# Patient Record
Sex: Female | Born: 1935 | Race: White | Hispanic: No | Marital: Married | Smoking: Never smoker
Health system: Southern US, Community
[De-identification: ages and names within clinical notes are randomized; demographics above are authoritative.]

## PROBLEM LIST (undated history)

## (undated) DIAGNOSIS — Z9221 Personal history of antineoplastic chemotherapy: Secondary | ICD-10-CM

## (undated) DIAGNOSIS — C50919 Malignant neoplasm of unspecified site of unspecified female breast: Secondary | ICD-10-CM

## (undated) DIAGNOSIS — I10 Essential (primary) hypertension: Secondary | ICD-10-CM

## (undated) DIAGNOSIS — F0390 Unspecified dementia without behavioral disturbance: Secondary | ICD-10-CM

## (undated) DIAGNOSIS — Z923 Personal history of irradiation: Secondary | ICD-10-CM

## (undated) HISTORY — PX: TONSILLECTOMY: SUR1361

---

## 2004-11-22 ENCOUNTER — Ambulatory Visit: Payer: Self-pay | Admitting: Internal Medicine

## 2005-03-11 ENCOUNTER — Ambulatory Visit: Payer: Self-pay | Admitting: Internal Medicine

## 2005-03-13 ENCOUNTER — Inpatient Hospital Stay: Payer: Self-pay | Admitting: Rheumatology

## 2005-12-26 ENCOUNTER — Ambulatory Visit: Payer: Self-pay | Admitting: Internal Medicine

## 2006-12-06 ENCOUNTER — Emergency Department: Payer: Self-pay | Admitting: Unknown Physician Specialty

## 2007-02-09 ENCOUNTER — Ambulatory Visit: Payer: Self-pay | Admitting: Nurse Practitioner

## 2007-06-21 ENCOUNTER — Ambulatory Visit: Payer: Self-pay | Admitting: Internal Medicine

## 2007-10-08 DIAGNOSIS — C50919 Malignant neoplasm of unspecified site of unspecified female breast: Secondary | ICD-10-CM

## 2007-10-08 HISTORY — DX: Malignant neoplasm of unspecified site of unspecified female breast: C50.919

## 2008-02-11 ENCOUNTER — Ambulatory Visit: Payer: Self-pay | Admitting: Family Medicine

## 2008-02-15 ENCOUNTER — Ambulatory Visit: Payer: Self-pay | Admitting: Internal Medicine

## 2008-03-05 ENCOUNTER — Ambulatory Visit: Payer: Self-pay | Admitting: Family Medicine

## 2008-03-15 ENCOUNTER — Ambulatory Visit: Payer: Self-pay | Admitting: Surgery

## 2008-03-15 HISTORY — PX: BREAST BIOPSY: SHX20

## 2008-03-31 ENCOUNTER — Other Ambulatory Visit: Payer: Self-pay

## 2008-03-31 ENCOUNTER — Ambulatory Visit: Payer: Self-pay | Admitting: Surgery

## 2008-04-06 ENCOUNTER — Ambulatory Visit: Payer: Self-pay | Admitting: Internal Medicine

## 2008-04-06 DIAGNOSIS — C50911 Malignant neoplasm of unspecified site of right female breast: Secondary | ICD-10-CM | POA: Insufficient documentation

## 2008-04-07 ENCOUNTER — Ambulatory Visit: Payer: Self-pay | Admitting: Surgery

## 2008-04-14 HISTORY — PX: BREAST LUMPECTOMY: SHX2

## 2008-04-27 ENCOUNTER — Ambulatory Visit: Payer: Self-pay | Admitting: Internal Medicine

## 2008-05-07 ENCOUNTER — Ambulatory Visit: Payer: Self-pay | Admitting: Internal Medicine

## 2008-05-21 ENCOUNTER — Ambulatory Visit: Payer: Self-pay | Admitting: Internal Medicine

## 2008-05-30 ENCOUNTER — Emergency Department: Payer: Self-pay | Admitting: Emergency Medicine

## 2008-06-07 ENCOUNTER — Ambulatory Visit: Payer: Self-pay | Admitting: Internal Medicine

## 2008-07-07 ENCOUNTER — Ambulatory Visit: Payer: Self-pay | Admitting: Internal Medicine

## 2008-08-07 ENCOUNTER — Ambulatory Visit: Payer: Self-pay | Admitting: Internal Medicine

## 2008-09-06 ENCOUNTER — Ambulatory Visit: Payer: Self-pay | Admitting: Internal Medicine

## 2008-10-07 ENCOUNTER — Ambulatory Visit: Payer: Self-pay | Admitting: Internal Medicine

## 2008-10-26 ENCOUNTER — Ambulatory Visit: Payer: Self-pay | Admitting: Internal Medicine

## 2008-11-07 ENCOUNTER — Ambulatory Visit: Payer: Self-pay | Admitting: Internal Medicine

## 2008-11-29 ENCOUNTER — Ambulatory Visit: Payer: Self-pay | Admitting: Internal Medicine

## 2008-12-05 ENCOUNTER — Ambulatory Visit: Payer: Self-pay | Admitting: Internal Medicine

## 2008-12-22 ENCOUNTER — Ambulatory Visit: Payer: Self-pay | Admitting: Internal Medicine

## 2009-01-05 ENCOUNTER — Ambulatory Visit: Payer: Self-pay | Admitting: Internal Medicine

## 2009-02-04 ENCOUNTER — Ambulatory Visit: Payer: Self-pay | Admitting: Radiation Oncology

## 2009-02-23 ENCOUNTER — Ambulatory Visit: Payer: Self-pay | Admitting: Internal Medicine

## 2009-02-27 ENCOUNTER — Ambulatory Visit: Payer: Self-pay | Admitting: Surgery

## 2009-03-07 ENCOUNTER — Ambulatory Visit: Payer: Self-pay | Admitting: Radiation Oncology

## 2009-04-06 ENCOUNTER — Ambulatory Visit: Payer: Self-pay | Admitting: Internal Medicine

## 2009-04-13 ENCOUNTER — Ambulatory Visit: Payer: Self-pay | Admitting: Internal Medicine

## 2009-05-07 ENCOUNTER — Ambulatory Visit: Payer: Self-pay | Admitting: Internal Medicine

## 2009-05-29 ENCOUNTER — Ambulatory Visit: Payer: Self-pay | Admitting: Surgery

## 2009-07-07 ENCOUNTER — Ambulatory Visit: Payer: Self-pay | Admitting: Internal Medicine

## 2009-08-03 ENCOUNTER — Ambulatory Visit: Payer: Self-pay | Admitting: Internal Medicine

## 2009-08-07 ENCOUNTER — Ambulatory Visit: Payer: Self-pay | Admitting: Internal Medicine

## 2009-09-06 ENCOUNTER — Ambulatory Visit: Payer: Self-pay | Admitting: Internal Medicine

## 2009-11-07 ENCOUNTER — Ambulatory Visit: Payer: Self-pay | Admitting: Internal Medicine

## 2009-11-23 ENCOUNTER — Ambulatory Visit: Payer: Self-pay | Admitting: Internal Medicine

## 2009-12-05 ENCOUNTER — Ambulatory Visit: Payer: Self-pay | Admitting: Internal Medicine

## 2010-03-01 ENCOUNTER — Ambulatory Visit: Payer: Self-pay | Admitting: Internal Medicine

## 2010-05-07 ENCOUNTER — Ambulatory Visit: Payer: Self-pay | Admitting: Internal Medicine

## 2010-05-24 ENCOUNTER — Ambulatory Visit: Payer: Self-pay | Admitting: Internal Medicine

## 2010-06-07 ENCOUNTER — Ambulatory Visit: Payer: Self-pay | Admitting: Internal Medicine

## 2010-08-21 ENCOUNTER — Ambulatory Visit: Payer: Self-pay | Admitting: Internal Medicine

## 2010-09-03 ENCOUNTER — Ambulatory Visit: Payer: Self-pay | Admitting: Internal Medicine

## 2010-09-06 ENCOUNTER — Ambulatory Visit: Payer: Self-pay | Admitting: Internal Medicine

## 2010-11-29 ENCOUNTER — Ambulatory Visit: Payer: Self-pay | Admitting: Internal Medicine

## 2010-11-30 LAB — CANCER ANTIGEN 27.29: CA 27.29: 26.7 U/mL (ref 0.0–38.6)

## 2010-12-06 ENCOUNTER — Ambulatory Visit: Payer: Self-pay | Admitting: Internal Medicine

## 2011-03-21 ENCOUNTER — Ambulatory Visit: Payer: Self-pay | Admitting: Surgery

## 2011-06-20 ENCOUNTER — Ambulatory Visit: Payer: Self-pay | Admitting: Internal Medicine

## 2011-06-21 LAB — CANCER ANTIGEN 27.29: CA 27.29: 16.4 U/mL (ref 0.0–38.6)

## 2011-07-08 ENCOUNTER — Ambulatory Visit: Payer: Self-pay | Admitting: Internal Medicine

## 2012-01-16 ENCOUNTER — Ambulatory Visit: Payer: Self-pay | Admitting: Internal Medicine

## 2012-01-16 LAB — CREATININE, SERUM
Creatinine: 0.67 mg/dL (ref 0.60–1.30)
EGFR (Non-African Amer.): >60

## 2012-01-16 LAB — CBC CANCER CENTER
Basophil #: 0 x10 3/mm (ref 0.0–0.1)
Eosinophil %: 0.6 %
HGB: 13.3 g/dL (ref 12.0–16.0)
Lymphocyte #: 1.2 x10 3/mm (ref 1.0–3.6)
MCV: 97 fL (ref 80–100)
Neutrophil #: 5.5 x10 3/mm (ref 1.4–6.5)
RBC: 4.06 10*6/uL (ref 3.80–5.20)
RDW: 12.7 % (ref 11.5–14.5)
WBC: 7.4 x10 3/mm (ref 3.6–11.0)

## 2012-01-16 LAB — HEPATIC FUNCTION PANEL A (ARMC)
Alkaline Phosphatase: 96 U/L (ref 50–136)
Bilirubin,Total: 0.4 mg/dL (ref 0.2–1.0)
SGPT (ALT): 26 U/L

## 2012-01-17 LAB — CANCER ANTIGEN 27.29: CA 27.29: 23.4 U/mL (ref 0.0–38.6)

## 2012-02-05 ENCOUNTER — Ambulatory Visit: Payer: Self-pay | Admitting: Internal Medicine

## 2012-03-11 ENCOUNTER — Emergency Department: Payer: Self-pay | Admitting: Emergency Medicine

## 2012-03-11 ENCOUNTER — Ambulatory Visit: Payer: Self-pay | Admitting: Family Medicine

## 2012-03-11 LAB — COMPREHENSIVE METABOLIC PANEL
Albumin: 4 g/dL (ref 3.4–5.0)
BUN: 9 mg/dL (ref 7–18)
Calcium, Total: 9.1 mg/dL (ref 8.5–10.1)
Co2: 31 mmol/L (ref 21–32)
Creatinine: 0.62 mg/dL (ref 0.60–1.30)
Glucose: 90 mg/dL (ref 65–99)
Osmolality: 265 (ref 275–301)
Potassium: 3.5 mmol/L (ref 3.5–5.1)
SGOT(AST): 27 U/L (ref 15–37)
Sodium: 133 mmol/L — ABNORMAL LOW (ref 136–145)

## 2012-03-11 LAB — URINALYSIS, COMPLETE
Bacteria: NONE SEEN
Bilirubin,UR: NEGATIVE
Blood: NEGATIVE
Glucose,UR: NEGATIVE mg/dL (ref 0–75)
Ketone: NEGATIVE
Nitrite: NEGATIVE
Ph: 7 (ref 4.5–8.0)
Protein: NEGATIVE
RBC,UR: 1 /HPF (ref 0–5)
Squamous Epithelial: <1
WBC UR: 4 /HPF (ref 0–5)

## 2012-03-11 LAB — CBC
HGB: 13 g/dL (ref 12.0–16.0)
MCHC: 33.4 g/dL (ref 32.0–36.0)
MCV: 96 fL (ref 80–100)
RBC: 4.03 10*6/uL (ref 3.80–5.20)
RDW: 13 % (ref 11.5–14.5)
WBC: 7.3 10*3/uL (ref 3.6–11.0)

## 2012-03-17 ENCOUNTER — Ambulatory Visit: Payer: Self-pay | Admitting: Family Medicine

## 2012-03-23 ENCOUNTER — Ambulatory Visit: Payer: Self-pay | Admitting: Internal Medicine

## 2012-03-27 ENCOUNTER — Ambulatory Visit: Payer: Self-pay | Admitting: Gastroenterology

## 2012-04-06 ENCOUNTER — Ambulatory Visit: Payer: Self-pay | Admitting: Internal Medicine

## 2012-04-24 ENCOUNTER — Ambulatory Visit: Payer: Self-pay | Admitting: Gastroenterology

## 2012-04-27 LAB — PATHOLOGY REPORT

## 2012-05-07 ENCOUNTER — Ambulatory Visit: Payer: Self-pay | Admitting: Internal Medicine

## 2012-07-09 ENCOUNTER — Ambulatory Visit: Payer: Self-pay | Admitting: Cardiothoracic Surgery

## 2012-07-09 ENCOUNTER — Ambulatory Visit: Payer: Self-pay | Admitting: Internal Medicine

## 2012-07-23 LAB — CBC CANCER CENTER
Basophil #: 0.1 x10 3/mm (ref 0.0–0.1)
Basophil %: 1.6 %
Eosinophil #: 0 x10 3/mm (ref 0.0–0.7)
HCT: 40.7 % (ref 35.0–47.0)
HGB: 13.6 g/dL (ref 12.0–16.0)
Lymphocyte %: 11.9 %
MCH: 32.6 pg (ref 26.0–34.0)
MCHC: 33.5 g/dL (ref 32.0–36.0)
MCV: 97 fL (ref 80–100)
Monocyte #: 0.5 x10 3/mm (ref 0.2–0.9)
Neutrophil #: 5.5 x10 3/mm (ref 1.4–6.5)
Neutrophil %: 78.8 %
RDW: 13.5 % (ref 11.5–14.5)

## 2012-07-23 LAB — HEPATIC FUNCTION PANEL A (ARMC)
Albumin: 3.8 g/dL (ref 3.4–5.0)
Bilirubin, Direct: 0.1 mg/dL (ref 0.00–0.20)
Bilirubin,Total: 0.4 mg/dL (ref 0.2–1.0)
SGOT(AST): 20 U/L (ref 15–37)
Total Protein: 7.4 g/dL (ref 6.4–8.2)

## 2012-07-23 LAB — CREATININE, SERUM: EGFR (African American): >60

## 2012-07-24 LAB — CANCER ANTIGEN 27.29: CA 27.29: 20.7 U/mL

## 2012-08-07 ENCOUNTER — Ambulatory Visit: Payer: Self-pay | Admitting: Internal Medicine

## 2012-11-04 ENCOUNTER — Ambulatory Visit: Payer: Self-pay | Admitting: Cardiothoracic Surgery

## 2012-11-11 ENCOUNTER — Ambulatory Visit: Payer: Self-pay | Admitting: Internal Medicine

## 2012-11-14 ENCOUNTER — Inpatient Hospital Stay: Payer: Self-pay | Admitting: Internal Medicine

## 2012-11-14 LAB — LIPASE, BLOOD: Lipase: 136 U/L (ref 73–393)

## 2012-11-14 LAB — URINALYSIS, COMPLETE
Bilirubin,UR: NEGATIVE
Blood: NEGATIVE
Leukocyte Esterase: NEGATIVE
Nitrite: NEGATIVE
Ph: 8 (ref 4.5–8.0)
Protein: NEGATIVE
RBC,UR: 2 /HPF (ref 0–5)
Specific Gravity: 1.01 (ref 1.003–1.030)
Squamous Epithelial: NONE SEEN
WBC UR: 3 /HPF (ref 0–5)

## 2012-11-14 LAB — CBC
HCT: 41.5 % (ref 35.0–47.0)
MCH: 32.4 pg (ref 26.0–34.0)
MCHC: 34.1 g/dL (ref 32.0–36.0)
MCV: 95 fL (ref 80–100)
RBC: 4.36 10*6/uL (ref 3.80–5.20)
RDW: 13 % (ref 11.5–14.5)
WBC: 11.8 10*3/uL — ABNORMAL HIGH (ref 3.6–11.0)

## 2012-11-14 LAB — COMPREHENSIVE METABOLIC PANEL
Anion Gap: 7 (ref 7–16)
Bilirubin,Total: 0.5 mg/dL (ref 0.2–1.0)
Chloride: 91 mmol/L — ABNORMAL LOW (ref 98–107)
EGFR (African American): >60
EGFR (Non-African Amer.): >60
Glucose: 102 mg/dL — ABNORMAL HIGH (ref 65–99)
Osmolality: 253 (ref 275–301)
Potassium: 4.4 mmol/L (ref 3.5–5.1)
SGOT(AST): 24 U/L (ref 15–37)
SGPT (ALT): 20 U/L (ref 12–78)
Total Protein: 7.9 g/dL (ref 6.4–8.2)

## 2012-11-15 LAB — COMPREHENSIVE METABOLIC PANEL
Albumin: 3.3 g/dL — ABNORMAL LOW (ref 3.4–5.0)
Anion Gap: 7 (ref 7–16)
BUN: 6 mg/dL — ABNORMAL LOW (ref 7–18)
Bilirubin,Total: 0.6 mg/dL (ref 0.2–1.0)
EGFR (African American): >60
EGFR (Non-African Amer.): >60
Glucose: 95 mg/dL (ref 65–99)
Potassium: 3.6 mmol/L (ref 3.5–5.1)
SGOT(AST): 19 U/L (ref 15–37)
SGPT (ALT): 15 U/L (ref 12–78)
Sodium: 133 mmol/L — ABNORMAL LOW (ref 136–145)
Total Protein: 6.2 g/dL — ABNORMAL LOW (ref 6.4–8.2)

## 2012-11-15 LAB — CBC WITH DIFFERENTIAL/PLATELET
Basophil #: 0 10*3/uL (ref 0.0–0.1)
Basophil %: 0.3 %
Eosinophil #: 0 10*3/uL (ref 0.0–0.7)
Eosinophil %: 0.6 %
HCT: 36.5 % (ref 35.0–47.0)
Lymphocyte #: 0.9 10*3/uL — ABNORMAL LOW (ref 1.0–3.6)
MCH: 32 pg (ref 26.0–34.0)
Monocyte %: 7.9 %
Neutrophil #: 6.6 10*3/uL — ABNORMAL HIGH (ref 1.4–6.5)
Platelet: 225 10*3/uL (ref 150–440)
RBC: 3.8 10*6/uL (ref 3.80–5.20)
RDW: 12.5 % (ref 11.5–14.5)
WBC: 8.2 10*3/uL (ref 3.6–11.0)

## 2012-12-05 ENCOUNTER — Ambulatory Visit: Payer: Self-pay | Admitting: Internal Medicine

## 2013-01-21 ENCOUNTER — Ambulatory Visit: Payer: Self-pay | Admitting: Internal Medicine

## 2013-01-21 LAB — CBC CANCER CENTER
Basophil %: 0.5 %
Eosinophil #: 0 x10 3/mm (ref 0.0–0.7)
Eosinophil %: 0.5 %
HCT: 39.6 % (ref 35.0–47.0)
HGB: 13.2 g/dL (ref 12.0–16.0)
Lymphocyte #: 1.2 x10 3/mm (ref 1.0–3.6)
Lymphocyte %: 18.7 %
MCV: 96 fL (ref 80–100)
Monocyte %: 8.7 %
Neutrophil %: 71.6 %
Platelet: 256 x10 3/mm (ref 150–440)
RBC: 4.13 10*6/uL (ref 3.80–5.20)
WBC: 6.5 x10 3/mm (ref 3.6–11.0)

## 2013-01-21 LAB — HEPATIC FUNCTION PANEL A (ARMC)
Alkaline Phosphatase: 82 U/L (ref 50–136)
Bilirubin, Direct: 0.1 mg/dL (ref 0.00–0.20)
Bilirubin,Total: 0.4 mg/dL (ref 0.2–1.0)
SGOT(AST): 18 U/L (ref 15–37)

## 2013-01-21 LAB — CREATININE, SERUM
Creatinine: 0.73 mg/dL (ref 0.60–1.30)
EGFR (African American): >60

## 2013-01-22 LAB — CANCER ANTIGEN 27.29: CA 27.29: 23 U/mL (ref 0.0–38.6)

## 2013-02-04 ENCOUNTER — Ambulatory Visit: Payer: Self-pay | Admitting: Internal Medicine

## 2013-03-07 ENCOUNTER — Ambulatory Visit: Payer: Self-pay | Admitting: Internal Medicine

## 2013-03-11 ENCOUNTER — Ambulatory Visit: Payer: Self-pay | Admitting: Cardiothoracic Surgery

## 2013-03-30 ENCOUNTER — Ambulatory Visit: Payer: Self-pay | Admitting: Internal Medicine

## 2013-04-06 ENCOUNTER — Ambulatory Visit: Payer: Self-pay | Admitting: Internal Medicine

## 2013-04-30 ENCOUNTER — Ambulatory Visit: Payer: Self-pay | Admitting: Gastroenterology

## 2013-05-06 ENCOUNTER — Emergency Department: Payer: Self-pay | Admitting: Emergency Medicine

## 2013-05-06 LAB — COMPREHENSIVE METABOLIC PANEL
Albumin: 3.9 g/dL (ref 3.4–5.0)
Alkaline Phosphatase: 91 U/L (ref 50–136)
Calcium, Total: 9.2 mg/dL (ref 8.5–10.1)
EGFR (African American): >60
EGFR (Non-African Amer.): >60
Glucose: 114 mg/dL — ABNORMAL HIGH (ref 65–99)
Potassium: 3.8 mmol/L (ref 3.5–5.1)
SGPT (ALT): 24 U/L (ref 12–78)
Sodium: 130 mmol/L — ABNORMAL LOW (ref 136–145)
Total Protein: 7.3 g/dL (ref 6.4–8.2)

## 2013-05-06 LAB — URINALYSIS, COMPLETE
Bilirubin,UR: NEGATIVE
Protein: NEGATIVE

## 2013-05-06 LAB — LIPASE, BLOOD: Lipase: 198 U/L (ref 73–393)

## 2013-05-06 LAB — CBC
HCT: 40.1 % (ref 35.0–47.0)
HGB: 13.8 g/dL (ref 12.0–16.0)
MCH: 33 pg (ref 26.0–34.0)
RBC: 4.19 10*6/uL (ref 3.80–5.20)
RDW: 12.8 % (ref 11.5–14.5)

## 2013-11-25 ENCOUNTER — Ambulatory Visit: Payer: Self-pay | Admitting: Internal Medicine

## 2013-11-25 LAB — HEPATIC FUNCTION PANEL A (ARMC)
Albumin: 4.3 g/dL (ref 3.4–5.0)
Alkaline Phosphatase: 101 U/L
BILIRUBIN DIRECT: 0.1 mg/dL (ref 0.00–0.20)
Bilirubin,Total: 0.4 mg/dL (ref 0.2–1.0)
SGOT(AST): 19 U/L (ref 15–37)
SGPT (ALT): 25 U/L (ref 12–78)
Total Protein: 7.8 g/dL (ref 6.4–8.2)

## 2013-11-25 LAB — CREATININE, SERUM: CREATININE: 0.77 mg/dL (ref 0.60–1.30)

## 2013-11-25 LAB — CBC CANCER CENTER
BASOS ABS: 0.1 x10 3/mm (ref 0.0–0.1)
Basophil %: 0.7 %
Eosinophil #: 0 x10 3/mm (ref 0.0–0.7)
Eosinophil %: 0.6 %
HCT: 42.4 % (ref 35.0–47.0)
HGB: 14.4 g/dL (ref 12.0–16.0)
LYMPHS PCT: 19.3 %
Lymphocyte #: 1.5 x10 3/mm (ref 1.0–3.6)
MCH: 32.7 pg (ref 26.0–34.0)
MCHC: 33.9 g/dL (ref 32.0–36.0)
MCV: 97 fL (ref 80–100)
MONO ABS: 0.5 x10 3/mm (ref 0.2–0.9)
Monocyte %: 6.8 %
NEUTROS PCT: 72.6 %
Neutrophil #: 5.6 x10 3/mm (ref 1.4–6.5)
Platelet: 258 x10 3/mm (ref 150–440)
RBC: 4.39 10*6/uL (ref 3.80–5.20)
RDW: 12.8 % (ref 11.5–14.5)
WBC: 7.7 x10 3/mm (ref 3.6–11.0)

## 2013-11-26 LAB — CANCER ANTIGEN 27.29: CA 27.29: 22.5 U/mL (ref 0.0–38.6)

## 2013-12-05 ENCOUNTER — Ambulatory Visit: Payer: Self-pay | Admitting: Internal Medicine

## 2014-01-27 ENCOUNTER — Ambulatory Visit: Payer: Self-pay | Admitting: Internal Medicine

## 2014-03-16 ENCOUNTER — Ambulatory Visit: Payer: Self-pay | Admitting: Internal Medicine

## 2014-03-24 ENCOUNTER — Ambulatory Visit: Payer: Self-pay | Admitting: Internal Medicine

## 2014-03-31 ENCOUNTER — Ambulatory Visit: Payer: Self-pay | Admitting: Internal Medicine

## 2014-03-31 ENCOUNTER — Ambulatory Visit: Payer: Self-pay | Admitting: Gastroenterology

## 2014-12-01 ENCOUNTER — Ambulatory Visit: Payer: Self-pay | Admitting: Internal Medicine

## 2014-12-06 ENCOUNTER — Ambulatory Visit
Admit: 2014-12-06 | Disposition: A | Discharge status: Home / Self Care | Payer: Self-pay | Attending: Internal Medicine | Admitting: Internal Medicine

## 2015-01-06 ENCOUNTER — Ambulatory Visit
Admit: 2015-01-06 | Disposition: A | Discharge status: Home / Self Care | Payer: Self-pay | Attending: Internal Medicine | Admitting: Internal Medicine

## 2015-01-27 NOTE — Discharge Summary (Signed)
PATIENT NAME:  Cynthia Walker, Cynthia Walker MR#:  [id number] DATE OF BIRTH:  1934-12-04  DATE OF ADMISSION:  11/14/2012 DATE OF DISCHARGE:  11/16/2012  PRIMARY CARE PHYSICIAN:  Sofie Hartigan, MD  DISCHARGE DIAGNOSES: Acute diverticulitis, hyponatremia, hypertension, breast cancer.   CONDITION: Stable.   CODE STATUS: Full code.   HOME MEDICATIONS:  Fosamax 1 tablet p.o. daily, Femara 2.5 mg p.o. tablets once a day, hydrochlorothiazide 25 mg p.o. daily, vitamin C 1 tablet p.o. daily,  vitamin B 1 tablet p.o. daily, multivitamin 1 tablet p.o. daily, Cipro 500 mg p.o. q. 12 hours for 7 days, Flagyl 500 mg p.o. every 8 hours for 7 days.   DIET: Low-sodium diet.   ACTIVITY: As tolerated.   FOLLOWUP CARE: Follow up with PCP within 1 to 2 weeks.   REASON FOR ADMISSION: Left lower quadrant abdominal pain.   HOSPITAL COURSE: The patient is a 80 year old Caucasian female with a history of recurrent diverticulitis, who presented to the ED due to acute onset of left lower quadrant pain, which is sharp. In ED, CAT scan of the abdomen showed acute diverticulitis. The patient was seen by on-call surgeon, but they did not feel there was any surgical issue that needed to be addressed. For detailed history and physical examination, please refer to the admission note dictated by Dr. Dustin Flock. On admission date, the patient's WBC 11.8, hemoglobin 14.2, BUN 7, creatinine 0.61. The patient was admitted for acute diverticulitis and has been treated with Cipro, Flagyl and pain control. The patient was placed on clear liquids. The patient denies any nausea and vomiting. No diarrhea. Abdominal pain has improved. She only feels a little bit sore. The patient's white count is normal, only has mild hyponatremia with sodium of 133. She was treated with IV fluid. The patient is clinically stable and will be discharged to home today. I discussed the patient's discharge plan with the patient and the patient's husband.   TIME  SPENT: About 36 minutes.     ____________________________ Demetrios Loll, MD qc:cc D: 11/16/2012 16:11:01 ET T: 11/16/2012 22:16:34 ET JOB#: [id number]  cc: Demetrios Loll, MD, <Dictator> Demetrios Loll MD ELECTRONICALLY SIGNED 11/17/2012 15:09

## 2015-01-27 NOTE — H&P (Signed)
PATIENT NAME:  Cynthia Walker, Cynthia Walker MR#:  [id number] DATE OF BIRTH:  [birth year]  DATE OF ADMISSION:  11/14/2012  PRIMARY CARE PROVIDER:  Dr.    EMERGENCY DEPARTMENT REFERRING PHYSICIAN:  Dr. Ulice Brilliant.   CHIEF COMPLAINT:  Left lower quadrant abdominal pain.   HISTORY OF PRESENT ILLNESS:  The patient is a pleasant 80 year old white female with history of recurrent diverticulitis who states that earlier this morning she developed acute onset of left lower quadrant abdominal pain.  Sharp in nature.  Came to the ED, had a CT scan which showed acute diverticulitis.  She was seen in consultation by on-call surgery.  They did not feel that there was any surgical issues that needed to be addressed.  She was noted to be a little hyponatremic, therefore we are asked to admit the patient for diverticulitis and hyponatremia.  The patient otherwise reports that she has not eaten anything, but has not had any nausea, vomiting or diarrhea.  Denies any fevers or chills.  No chest pains.  No shortness of breath.   PAST MEDICAL HISTORY:  History of having diverticulitis x 5 time previously.  Hypertension, history of right breast cancer status post lumpectomy, was not able to tolerate chemo, so had radiation.   PAST SURGICAL HISTORY: 1.  Status post right lumpectomy.  2.  Status post tonsillectomy.   ALLERGIES:  QUESTIONABLE TO PENICILLIN, ALSO ALLERGIC TO SOY.   SOCIAL HISTORY:  Does not smoke.  Does not drink.  No drugs.   FAMILY HISTORY:  Positive for coronary artery disease.   CURRENT MEDICATIONS:  HCTZ 25 by mouth daily.  She is on alendronate weekly.  She is also on letrozole 2.5 mg by mouth daily.   REVIEW OF SYSTEMS:  CONSTITUTIONAL:  Denies any fevers, fatigueness, weakness.  Complains of left lower quadrant pain.  No weight loss.  No weight gain.  EYES:  No blurred or double vision.  No pain.  No redness.  No inflammation.  No glaucoma.  EARS, NOSE, THROAT:  No tinnitus.  No ear pain.  No difficulty  swallowing.  No postnasal drip.  RESPIRATORY:  Denies any cough, wheezing.  No COPD.  No TB.  CARDIOVASCULAR:  No chest pain.  No orthopnea.  No edema.  No syncope.  GASTROINTESTINAL:  No nausea, vomiting.  Complains of left lower quadrant abdominal pain.  No hematemesis.  No melena.  No GERD.  GENITOURINARY:  Denies any dysuria, hematuria, renal calculus or frequency.  ENDOCRINE:  Denies any polyuria, nocturia or thyroid problems.  HEMATOLOGY AND LYMPHATIC:  Denies anemia, easy bruisability or bleeding.  SKIN:  No acne.  No rash.  No changes in mole, hair or skin.  MUSCULOSKELETAL:  Denies any pain in neck, back or shoulder.  NEUROLOGIC:  No numbness.  No CVA.  No TIA.  No seizures.  PSYCHIATRIC:  No anxiety.  No insomnia.  No ADD.  No OCD.   PHYSICAL EXAMINATION: VITAL SIGNS:  Temperature 95.9, pulse 99, respirations 20, blood pressure 175/88, O2 98%.  GENERAL:  The patient is a well-developed, well-nourished female in no acute distress.  HEENT:  Head atraumatic, normocephalic.  Pupils equally round, reactive to light and accommodation.  There is no conjunctival pallor.  No scleral icterus.  Nasal exam shows no drainage or ulceration.  Oropharynx clear without any exudate.  NECK:  No thyromegaly.  No carotid bruits.  CARDIOVASCULAR:  Regular rate and rhythm.  No murmurs, rubs, clicks or gallops.  PMI is not displaced.  LUNGS:  Clear to auscultation bilaterally without any rales, rhonchi, wheezing.  ABDOMEN:  There is a left lower quadrant tenderness without any guarding or rebound.  EXTREMITIES:  No clubbing, cyanosis, edema.  SKIN:  No rash.  LYMPHATICS:  No lymph nodes palpable.  VASCULAR:  Good DP, PT pulses.  PSYCHIATRIC:  Not anxious or depressed.   LABORATORY EVALUATIONS:  CT scan of the abdomen shows findings consistent with acute diverticulitis of the sigmoid colon.  No extraluminal fluid collection identified.  WBC count was 11.8, hemoglobin 14.2, platelet count 13.  BMP, glucose  102, BUN 7, creatinine 0.61, sodium 127, potassium 4.4, chloride 91, CO2 is 29.  LFTs were normal.   ASSESSMENT AND PLAN:  The patient is a 80 year old white female with history of recurrent diverticulitis, presents with left lower quadrant abdominal pain, has acute diverticulitis.  1.  Acute diverticulitis.  At this time we will treat her with Cipro and Flagyl and pain control.  We will put her on a clear liquid diet.  The patient has been seen by Dr. Leanora Cover who feels no surgical need, he states that if patient wants to have elective surgery for recurrent diverticulitis, she has been seen by Dr. Tamala Julian who recommends a follow-up.  If does not improve in the hospital he recommends Dr. Thompson Caul evaluation.  2.  Hyponatremia, likely due to dehydration as well as HCTZ therapy.  At this time we will go ahead and hold her HCTZ, give her normal saline.  Follow-up BMP in the morning.  3.  Hypertension.  We will hold HCTZ for now.  4.  Breast cancer.  We will continue letrozole daily.  5.  Miscellaneous.  We will place her on Lovenox for deep vein thrombosis prophylaxis.   TIME SPENT:  Thirty-five minutes spent.      ____________________________ Lafonda Mosses. Posey Pronto, MD shp:ea D: 11/14/2012 22:29:48 ET T: 11/15/2012 04:11:30 ET JOB#: [id number]  cc: Ferdinando Lodge H. Posey Pronto, MD, <Dictator> Alric Seton MD ELECTRONICALLY SIGNED 11/17/2012 10:19

## 2015-02-03 ENCOUNTER — Other Ambulatory Visit: Payer: Self-pay

## 2015-02-03 DIAGNOSIS — Z1231 Encounter for screening mammogram for malignant neoplasm of breast: Secondary | ICD-10-CM

## 2015-02-21 ENCOUNTER — Other Ambulatory Visit: Payer: Self-pay | Admitting: *Deleted

## 2015-02-21 DIAGNOSIS — C50911 Malignant neoplasm of unspecified site of right female breast: Secondary | ICD-10-CM

## 2015-02-23 ENCOUNTER — Other Ambulatory Visit: Payer: Self-pay | Admitting: *Deleted

## 2015-02-23 DIAGNOSIS — C50911 Malignant neoplasm of unspecified site of right female breast: Secondary | ICD-10-CM

## 2015-03-28 ENCOUNTER — Other Ambulatory Visit: Payer: Self-pay | Admitting: Family Medicine

## 2015-03-28 DIAGNOSIS — R1032 Left lower quadrant pain: Secondary | ICD-10-CM

## 2015-04-03 ENCOUNTER — Other Ambulatory Visit: Payer: Self-pay | Admitting: Internal Medicine

## 2015-04-03 ENCOUNTER — Ambulatory Visit: Payer: Self-pay

## 2015-04-03 ENCOUNTER — Ambulatory Visit
Admission: RE | Admit: 2015-04-03 | Discharge: 2015-04-03 | Disposition: A | Discharge status: Home / Self Care | Payer: Medicare Other | Source: Ambulatory Visit | Attending: Internal Medicine | Admitting: Internal Medicine

## 2015-04-03 ENCOUNTER — Ambulatory Visit: Payer: Medicare Other

## 2015-04-03 DIAGNOSIS — C50911 Malignant neoplasm of unspecified site of right female breast: Secondary | ICD-10-CM

## 2015-04-03 DIAGNOSIS — Z853 Personal history of malignant neoplasm of breast: Secondary | ICD-10-CM | POA: Insufficient documentation

## 2015-04-03 HISTORY — DX: Malignant neoplasm of unspecified site of unspecified female breast: C50.919

## 2015-04-04 ENCOUNTER — Ambulatory Visit
Admission: RE | Admit: 2015-04-04 | Discharge: 2015-04-04 | Disposition: A | Discharge status: Home / Self Care | Payer: Medicare Other | Source: Ambulatory Visit | Attending: Family Medicine | Admitting: Family Medicine

## 2015-04-04 ENCOUNTER — Other Ambulatory Visit
Admission: AD | Admit: 2015-04-04 | Discharge: 2015-04-04 | Disposition: A | Discharge status: Home / Self Care | Payer: Medicare Other | Source: Ambulatory Visit | Attending: Family Medicine | Admitting: Family Medicine

## 2015-04-04 ENCOUNTER — Encounter (INDEPENDENT_AMBULATORY_CARE_PROVIDER_SITE_OTHER): Payer: Self-pay

## 2015-04-04 DIAGNOSIS — R1032 Left lower quadrant pain: Secondary | ICD-10-CM | POA: Diagnosis present

## 2015-04-04 DIAGNOSIS — R911 Solitary pulmonary nodule: Secondary | ICD-10-CM | POA: Insufficient documentation

## 2015-04-04 DIAGNOSIS — I7090 Unspecified atherosclerosis: Secondary | ICD-10-CM | POA: Diagnosis not present

## 2015-04-04 HISTORY — DX: Essential (primary) hypertension: I10

## 2015-04-04 LAB — CREATININE, SERUM
Creatinine, Ser: 0.63 mg/dL (ref 0.44–1.00)
GFR calc Af Amer: >60 mL/min (ref >60)
GFR calc non Af Amer: >60 mL/min (ref >60)

## 2015-04-04 LAB — BUN: BUN: 11 mg/dL (ref 6–20)

## 2015-04-04 MED ORDER — IOHEXOL 300 MG/ML  SOLN
100.0000 mL | Freq: Once | INTRAMUSCULAR | Status: AC | PRN
  Administered 2015-04-04: 100 mL via INTRAVENOUS

## 2015-07-10 ENCOUNTER — Ambulatory Visit: Payer: Medicare Other | Admitting: Anesthesiology

## 2015-07-10 ENCOUNTER — Ambulatory Visit
Admission: RE | Admit: 2015-07-10 | Discharge: 2015-07-10 | Disposition: A | Discharge status: Home / Self Care | Payer: Medicare Other | Source: Ambulatory Visit | Attending: Gastroenterology | Admitting: Gastroenterology

## 2015-07-10 ENCOUNTER — Encounter
Admission: RE | Disposition: A | Discharge status: Home / Self Care | Payer: Self-pay | Source: Ambulatory Visit | Attending: Gastroenterology

## 2015-07-10 DIAGNOSIS — R1032 Left lower quadrant pain: Secondary | ICD-10-CM | POA: Insufficient documentation

## 2015-07-10 DIAGNOSIS — Z79899 Other long term (current) drug therapy: Secondary | ICD-10-CM | POA: Diagnosis not present

## 2015-07-10 DIAGNOSIS — M810 Age-related osteoporosis without current pathological fracture: Secondary | ICD-10-CM | POA: Insufficient documentation

## 2015-07-10 DIAGNOSIS — Z86010 Personal history of colonic polyps: Secondary | ICD-10-CM | POA: Insufficient documentation

## 2015-07-10 DIAGNOSIS — K5730 Diverticulosis of large intestine without perforation or abscess without bleeding: Secondary | ICD-10-CM | POA: Diagnosis not present

## 2015-07-10 DIAGNOSIS — I10 Essential (primary) hypertension: Secondary | ICD-10-CM | POA: Diagnosis not present

## 2015-07-10 DIAGNOSIS — K5900 Constipation, unspecified: Secondary | ICD-10-CM | POA: Diagnosis not present

## 2015-07-10 DIAGNOSIS — E785 Hyperlipidemia, unspecified: Secondary | ICD-10-CM | POA: Diagnosis not present

## 2015-07-10 DIAGNOSIS — Z853 Personal history of malignant neoplasm of breast: Secondary | ICD-10-CM | POA: Insufficient documentation

## 2015-07-10 HISTORY — PX: COLONOSCOPY WITH PROPOFOL: SHX5780

## 2015-07-10 SURGERY — COLONOSCOPY WITH PROPOFOL
Anesthesia: General

## 2015-07-10 MED ORDER — LIDOCAINE HCL (CARDIAC) 20 MG/ML IV SOLN
INTRAVENOUS | Status: DC | PRN
  Administered 2015-07-10: 60 mg via INTRAVENOUS

## 2015-07-10 MED ORDER — SODIUM CHLORIDE 0.9 % IV SOLN
INTRAVENOUS | Status: DC
  Administered 2015-07-10 (×2): via INTRAVENOUS

## 2015-07-10 MED ORDER — PROPOFOL 10 MG/ML IV BOLUS
INTRAVENOUS | Status: DC | PRN
  Administered 2015-07-10: 40 mg via INTRAVENOUS

## 2015-07-10 MED ORDER — PROPOFOL 500 MG/50ML IV EMUL
INTRAVENOUS | Status: DC | PRN
  Administered 2015-07-10: 140 ug/kg/min via INTRAVENOUS

## 2015-07-10 MED ORDER — SODIUM CHLORIDE 0.9 % IV SOLN: INTRAVENOUS | Status: DC

## 2015-07-10 MED ORDER — PHENYLEPHRINE HCL 10 MG/ML IJ SOLN
INTRAMUSCULAR | Status: DC | PRN
  Administered 2015-07-10: 100 ug via INTRAVENOUS

## 2015-07-10 NOTE — Anesthesia Preprocedure Evaluation (Signed)
Anesthesia Evaluation  Patient identified by MRN, date of birth, ID band Patient awake    Airway Mallampati: II       Dental no notable dental hx.    Pulmonary    Pulmonary exam normal        Cardiovascular hypertension, Pt. on medications Normal cardiovascular exam     Neuro/Psych    GI/Hepatic negative GI ROS, Neg liver ROS,   Endo/Other  negative endocrine ROS  Renal/GU negative Renal ROS     Musculoskeletal negative musculoskeletal ROS (+)   Abdominal Normal abdominal exam  (+)   Peds  Hematology negative hematology ROS (+)   Anesthesia Other Findings   Reproductive/Obstetrics negative OB ROS                             Anesthesia Physical Anesthesia Plan  ASA: II  Anesthesia Plan: General   Post-op Pain Management:    Induction: Intravenous  Airway Management Planned: Nasal Cannula  Additional Equipment:   Intra-op Plan:   Post-operative Plan:   Informed Consent: I have reviewed the patients History and Physical, chart, labs and discussed the procedure including the risks, benefits and alternatives for the proposed anesthesia with the patient or authorized representative who has indicated his/her understanding and acceptance.     Plan Discussed with: CRNA  Anesthesia Plan Comments:         Anesthesia Quick Evaluation

## 2015-07-10 NOTE — Op Note (Signed)
[facility] Patient Name: Zacari Radick Procedure Date: 07/10/2015 11:47 AM MRN: [id number] Account #: [id number] Date of Birth: [birth year] Admit Type: Outpatient Age: 80 Room: [facility] ENDO ROOM 4 Gender: Female Note Status: Finalized Procedure:         Colonoscopy Indications:       Abdominal pain in the left lower quadrant, Personal                     history of colonic polyps Providers:         Lupita Dawn. Candace Cruise, MD Referring MD:      Sofie Hartigan (Referring MD) Medicines:         Monitored Anesthesia Care Complications:     No immediate complications. Procedure:         Pre-Anesthesia Assessment:                    - Prior to the procedure, a History and Physical was                     performed, and patient medications, allergies and                     sensitivities were reviewed. The patient's tolerance of                     previous anesthesia was reviewed.                    - The risks and benefits of the procedure and the sedation                     options and risks were discussed with the patient. All                     questions were answered and informed consent was obtained.                    - After reviewing the risks and benefits, the patient was                     deemed in satisfactory condition to undergo the procedure.                    After obtaining informed consent, the colonoscope was                     passed under direct vision. Throughout the procedure, the                     patient's blood pressure, pulse, and oxygen saturations                     were monitored continuously. The Colonoscope was                     introduced through the anus and advanced to the the cecum,                     identified by appendiceal orifice and ileocecal valve. The                     colonoscopy was performed without difficulty. The patient  tolerated the procedure well. The quality of the bowel                   preparation was good. Findings:      Multiple small and large-mouthed diverticula were found in the sigmoid       colon.      The exam was otherwise without abnormality. Impression:        - Diverticulosis in the sigmoid colon.                    - The examination was otherwise normal.                    - No specimens collected. Recommendation:    - Discharge patient to home.                    - High fiber diet daily.                    - The findings and recommendations were discussed with the                     patient. Procedure Code(s): --- Professional ---                    [phone], Colonoscopy, flexible; diagnostic, including                     collection of specimen(s) by brushing or washing, when                     performed (separate procedure) Diagnosis Code(s): --- Professional ---                    R10.32, Left lower quadrant pain                    Z86.010, Personal history of colonic polyps                    K57.30, Diverticulosis of large intestine without                     perforation or abscess without bleeding CPT copyright 2014 American Medical Association. All rights reserved. The codes documented in this report are preliminary and upon coder review may  be revised to meet current compliance requirements. Hulen Luster, MD 07/10/2015 12:06:55 PM This report has been signed electronically. Number of Addenda: 0 Note Initiated On: 07/10/2015 11:47 AM Scope Withdrawal Time: 0 hours 5 minutes 16 seconds  Total Procedure Duration: 0 hours 10 minutes 21 seconds       [facility]

## 2015-07-10 NOTE — H&P (Signed)
  Date of Initial H&P:06/27/2015 History reviewed, patient examined, no change in status, stable for surgery.

## 2015-07-10 NOTE — Transfer of Care (Signed)
Immediate Anesthesia Transfer of Care Note  Patient: Cynthia Walker  Procedure(s) Performed: Procedure(s): COLONOSCOPY WITH PROPOFOL (N/A)  Patient Location: Endoscopy Unit  Anesthesia Type:General  Level of Consciousness: awake, alert , oriented and patient cooperative  Airway & Oxygen Therapy: Patient Spontanous Breathing and Patient connected to nasal cannula oxygen  Post-op Assessment: Report given to RN, Post -op Vital signs reviewed and stable and Patient moving all extremities X 4  Post vital signs: Reviewed and stable  Last Vitals:  Filed Vitals:   07/10/15 1210  BP: 106/68  Pulse: 62  Temp: 36 C  Resp: 12    Complications: No apparent anesthesia complications

## 2015-07-11 ENCOUNTER — Encounter: Payer: Self-pay | Admitting: Gastroenterology

## 2015-07-11 NOTE — Anesthesia Postprocedure Evaluation (Signed)
  Anesthesia Post-op Note  Patient: Cynthia Walker  Procedure(s) Performed: Procedure(s): COLONOSCOPY WITH PROPOFOL (N/A)  Anesthesia type:General  Patient location: PACU  Post pain: Pain level controlled  Post assessment: Post-op Vital signs reviewed, Patient's Cardiovascular Status Stable, Respiratory Function Stable, Patent Airway and No signs of Nausea or vomiting  Post vital signs: Reviewed and stable  Last Vitals:  Filed Vitals:   07/10/15 1240  BP: 106/75  Pulse: 64  Temp:   Resp: 20    Level of consciousness: awake, alert  and patient cooperative  Complications: No apparent anesthesia complications

## 2015-11-29 ENCOUNTER — Other Ambulatory Visit: Payer: Self-pay | Admitting: *Deleted

## 2015-11-29 DIAGNOSIS — C50919 Malignant neoplasm of unspecified site of unspecified female breast: Secondary | ICD-10-CM

## 2015-11-30 ENCOUNTER — Inpatient Hospital Stay: Payer: Medicare Other | Attending: Internal Medicine

## 2015-11-30 ENCOUNTER — Encounter: Payer: Self-pay | Admitting: Internal Medicine

## 2015-11-30 ENCOUNTER — Ambulatory Visit (HOSPITAL_BASED_OUTPATIENT_CLINIC_OR_DEPARTMENT_OTHER): Payer: Medicare Other | Admitting: Internal Medicine

## 2015-11-30 VITALS — BP 101/69 | HR 84 | Temp 98.6°F | Resp 18 | Ht 63.0 in | Wt 134.3 lb

## 2015-11-30 DIAGNOSIS — I10 Essential (primary) hypertension: Secondary | ICD-10-CM | POA: Diagnosis not present

## 2015-11-30 DIAGNOSIS — N644 Mastodynia: Secondary | ICD-10-CM

## 2015-11-30 DIAGNOSIS — Z170 Estrogen receptor positive status [ER+]: Secondary | ICD-10-CM | POA: Insufficient documentation

## 2015-11-30 DIAGNOSIS — Z853 Personal history of malignant neoplasm of breast: Secondary | ICD-10-CM | POA: Diagnosis not present

## 2015-11-30 DIAGNOSIS — M810 Age-related osteoporosis without current pathological fracture: Secondary | ICD-10-CM

## 2015-11-30 DIAGNOSIS — Z79899 Other long term (current) drug therapy: Secondary | ICD-10-CM | POA: Diagnosis not present

## 2015-11-30 DIAGNOSIS — K589 Irritable bowel syndrome without diarrhea: Secondary | ICD-10-CM

## 2015-11-30 DIAGNOSIS — Z9221 Personal history of antineoplastic chemotherapy: Secondary | ICD-10-CM | POA: Diagnosis not present

## 2015-11-30 DIAGNOSIS — C50919 Malignant neoplasm of unspecified site of unspecified female breast: Secondary | ICD-10-CM

## 2015-11-30 DIAGNOSIS — Z9223 Personal history of estrogen therapy: Secondary | ICD-10-CM | POA: Diagnosis not present

## 2015-11-30 DIAGNOSIS — K5790 Diverticulosis of intestine, part unspecified, without perforation or abscess without bleeding: Secondary | ICD-10-CM

## 2015-11-30 DIAGNOSIS — Z923 Personal history of irradiation: Secondary | ICD-10-CM | POA: Diagnosis not present

## 2015-11-30 DIAGNOSIS — C50911 Malignant neoplasm of unspecified site of right female breast: Secondary | ICD-10-CM

## 2015-11-30 LAB — CBC WITH DIFFERENTIAL/PLATELET
BASOS ABS: 0.1 10*3/uL (ref 0–0.1)
BASOS PCT: 1 %
EOS ABS: 0 10*3/uL (ref 0–0.7)
EOS PCT: 0 %
HCT: 39.5 % (ref 35.0–47.0)
HEMOGLOBIN: 13.3 g/dL (ref 12.0–16.0)
LYMPHS ABS: 1.2 10*3/uL (ref 1.0–3.6)
Lymphocytes Relative: 12 %
MCH: 32.5 pg (ref 26.0–34.0)
MCHC: 33.5 g/dL (ref 32.0–36.0)
MCV: 96.9 fL (ref 80.0–100.0)
Monocytes Absolute: 0.6 10*3/uL (ref 0.2–0.9)
Monocytes Relative: 6 %
NEUTROS PCT: 81 %
Neutro Abs: 8.6 10*3/uL — ABNORMAL HIGH (ref 1.4–6.5)
PLATELETS: 269 10*3/uL (ref 150–440)
RBC: 4.08 MIL/uL (ref 3.80–5.20)
RDW: 12.6 % (ref 11.5–14.5)
WBC: 10.5 10*3/uL (ref 3.6–11.0)

## 2015-11-30 LAB — CREATININE, SERUM
CREATININE: 0.72 mg/dL (ref 0.44–1.00)
GFR calc Af Amer: >60 mL/min (ref >60)

## 2015-11-30 LAB — HEPATIC FUNCTION PANEL
ALK PHOS: 80 U/L (ref 38–126)
ALT: 16 U/L (ref 14–54)
AST: 24 U/L (ref 15–41)
Albumin: 4.1 g/dL (ref 3.5–5.0)
Bilirubin, Direct: <0.1 mg/dL — ABNORMAL LOW (ref 0.1–0.5)
TOTAL PROTEIN: 6.8 g/dL (ref 6.5–8.1)
Total Bilirubin: 0.3 mg/dL (ref 0.3–1.2)

## 2015-11-30 MED ORDER — ALENDRONATE SODIUM 70 MG PO TABS: 70.0000 mg | ORAL_TABLET | ORAL | Status: DC

## 2015-11-30 NOTE — Progress Notes (Signed)
Pt is sensitive to touch with breast exam. She does not do her own self breast exams.  She has no c/o today and she is eating and drinking without problems. Wt. steady

## 2015-11-30 NOTE — Progress Notes (Signed)
[place] @ [facility] [phone]) [phone]  Fax:(336) [place]: 1935-09-16  MR#: [id number]  JYN#:[id number]  Patient Care Team: Sofie Hartigan, MD as PCP - General (Family Medicine)  CHIEF COMPLAINT: No chief complaint on file.    No history exists.   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 Tumor size 1.1 cm,  2/2 lymph nodes negative. Margins negative for carcinoma, no venous or lymphatic invasion. ER and PR positive.  HER-2/neu negative (2+ by IHC, negative on gene amplification).  adjuvant chemotherapy since Oncotype DX test showed intermediate risk (score of 21). Adjuvant chemotherapy discontinued after significant allergic reaction to Taxotere/Cytoxan, then to weekly Taxol/Cytoxan.  Completed 5 Years of adjuvant hormonal therapy with Femara (October 26, 2008 - Jan 2015).  No flowsheet data found.  HISTORY OF PRESENT ILLNESS:    Cynthia Walker returns to our clinic for a follow-up visit. She has done well since her last visit. She had colonoscopy in October 2016, which revealed diverticulosis, but no other abnormal findings. She has had a persistent tenderness in both breasts for a very long time, without any changes in the condition. She specifically denies bone pains, nausea, vomiting, diarrhea, constipation, bleeding from any source, fevers, chills.  REVIEW OF SYSTEMS:   Review of Systems  All other systems reviewed and are negative.    PAST MEDICAL HISTORY: Past Medical History  Diagnosis Date  . Breast cancer [facility]) 2009    Right breast CA with lumpectomy, radiation and chemo tx's.  Marland Kitchen Hypertension    Irritable Bowel Syndrome  Diverticulosis  Fibrocystic Breast Disease     PAST SURGICAL HISTORY: Past Surgical History  Procedure Laterality Date  . Breast excisional biopsy Right 2009  . Colonoscopy with propofol N/A 07/10/2015    Procedure:  COLONOSCOPY WITH PROPOFOL;  Surgeon: Hulen Luster, MD;  Location: [facility] ENDOSCOPY;  Service: Gastroenterology;  Laterality: N/A;    FAMILY HISTORY No family history on file. Noncontributory. Great grandmother had breast cancer ADVANCED DIRECTIVES:  No flowsheet data found.  HEALTH MAINTENANCE: Social History  Substance Use Topics  . Smoking status: Never Smoker   . Smokeless tobacco: Never Used  . Alcohol Use: No   this he   Allergies  Allergen Reactions  . Penicillins Rash    Current Outpatient Prescriptions  Medication Sig Dispense Refill  . Calcium Carbonate-Vitamin D (CALTRATE 600+D) 600-400 MG-UNIT tablet Take 1 tablet by mouth daily.    Marland Kitchen lisinopril (PRINIVIL,ZESTRIL) 10 MG tablet Take 10 mg by mouth daily.    Marland Kitchen alendronate (FOSAMAX) 70 MG tablet Take 1 tablet (70 mg total) by mouth once a week. Take with a full glass of water on an empty stomach. 4 tablet 6   No current facility-administered medications for this visit.    OBJECTIVE:  Filed Vitals:   11/30/15 1055  BP: 101/69  Pulse: 84  Temp: 98.6 F (37 C)  Resp: 18     Body mass index is 23.79 kg/(m^2). I think is a problem as best way     ECOG FS:0 - Asymptomatic  Physical Exam BP 101/69 mmHg  Pulse 84  Temp(Src) 98.6 F (37 C) (Tympanic)  Resp 18  Ht '5\' 3"'$  (1.6 m)  Wt 134 lb 4.2 oz (60.9 kg)  BMI 23.79 kg/m2  General Appearance:    Alert, cooperative, no distress, appears younger than stated age 81 female  Head:    Normocephalic, without obvious abnormality, atraumatic  Eyes:    PERRL, conjunctiva/corneas clear, EOM's intact, fundi    benign, both eyes  Ears:    Normal TM's and external ear canals, both ears  Nose:   Nares normal, septum midline, mucosa normal, no drainage    or sinus tenderness  Throat:   Lips, mucosa, and tongue normal; teeth and gums normal  Neck:   Supple, symmetrical, trachea midline, no adenopathy;    thyroid:  no enlargement/tenderness/nodules; no carotid   bruit  or JVD  Back:     Symmetric, no curvature, ROM normal, no CVA tenderness  Lungs:     Clear to auscultation bilaterally, respirations unlabored  Chest Wall:    No tenderness or deformity   Heart:    Regular rate and rhythm, S1 and S2 normal, no murmur, rub   or gallop  Breast Exam:    right-status post lumpectomy. Diffusely tender breast without clearly palpable masses. No lymphadenopathy or nipple discharge Left-mildly diffusely tender breast without clearly palpable masses, discharge or lymphadenopathy   Abdomen:     Soft, non-tender, bowel sounds active all four quadrants,    no masses, no organomegaly  Extremities:   Extremities normal, atraumatic, no cyanosis or edema  Pulses:   2+ and symmetric all extremities  Skin:   Skin color, texture, turgor normal, no rashes or lesions  Lymph nodes:   Cervical, supraclavicular, and axillary nodes normal  Neurologic:   CNII-XII intact, normal strength, sensation and reflexes    throughout     LAB RESULTS:  CBC Latest Ref Rng 11/25/2013 05/06/2013  WBC 3.6-11.0 x10 3/mm  7.7 8.2  Hemoglobin 12.0-16.0 g/dL 14.4 13.8  Hematocrit 35.0-47.0 % 42.4 40.1  Platelets 150-440 x10 3/mm  258 265    No visits with results within 5 Day(s) from this visit. Latest known visit with results is:  Hospital Outpatient Visit on 04/04/2015  Component Date Value Ref Range Status  . BUN 04/04/2015 11  6 - 20 mg/dL Final  . Creatinine, Ser 04/04/2015 0.63  0.44 - 1.00 mg/dL Final  . GFR calc non Af Amer 04/04/2015 >60  >60 mL/min Final  . GFR calc Af Amer 04/04/2015 >60  >60 mL/min Final   Comment: (NOTE) The eGFR has been calculated using the CKD EPI equation. This calculation has not been validated in all clinical situations. eGFR's persistently <60 mL/min signify possible Chronic Kidney Disease.       STUDIES: No results found.  ASSESSMENT and MEDICAL DECISION MAKING:  Right breast cancer, stage I, ER positive PR positive HER-2 negative-the  patient status post lumpectomy, radiation and adjuvant chemotherapy in 2009, completed 5 years of aromatase inhibitors in January of 2015. She does not have any evidence of disease recurrence on physical exam or mammogram. We should streamline her follow-up, and have the annual mammogram performed in July 2017, followed by an M.D. visit. She has not being followed by her primary care physician regularly, so it makes sense to continue an annual follow-up with hematology/oncology. Osteoporosis-patient has documented osteoporosis, but is not on any bisphosphonates, although in the past she used to take Fosamax. In the patient with established osteoporosis, as seen on DEXA scan from 2013 and 2015, who was on aromatase inhibitor for 5 years, it is essential to provide bisphosphonates to improve bone density. We will prescribe Fosamax. We will also request a DEXA scan to be performed around the time of mammogram, since she is due to be  repeated later this year. She is up-to-date with colonoscopy Patient expressed understanding and was in agreement with this plan. She also understands that She can call clinic at any time with any questions, concerns, or complaints.    No matching staging information was found for the patient.  Roxana Hires, MD   11/30/2015 10:31 AM

## 2016-04-02 ENCOUNTER — Ambulatory Visit
Admission: RE | Admit: 2016-04-02 | Discharge: 2016-04-02 | Disposition: A | Discharge status: Home / Self Care | Payer: Medicare Other | Source: Ambulatory Visit | Attending: Internal Medicine | Admitting: Internal Medicine

## 2016-04-02 ENCOUNTER — Other Ambulatory Visit: Payer: Self-pay | Admitting: Internal Medicine

## 2016-04-02 DIAGNOSIS — M810 Age-related osteoporosis without current pathological fracture: Secondary | ICD-10-CM | POA: Insufficient documentation

## 2016-04-02 DIAGNOSIS — Z1231 Encounter for screening mammogram for malignant neoplasm of breast: Secondary | ICD-10-CM | POA: Diagnosis present

## 2016-04-02 DIAGNOSIS — C50911 Malignant neoplasm of unspecified site of right female breast: Secondary | ICD-10-CM

## 2016-04-02 DIAGNOSIS — Z853 Personal history of malignant neoplasm of breast: Secondary | ICD-10-CM | POA: Diagnosis present

## 2016-04-11 ENCOUNTER — Ambulatory Visit: Payer: Medicare Other

## 2016-04-11 ENCOUNTER — Other Ambulatory Visit: Payer: Medicare Other

## 2016-04-16 ENCOUNTER — Other Ambulatory Visit: Payer: Self-pay

## 2016-04-16 DIAGNOSIS — C50911 Malignant neoplasm of unspecified site of right female breast: Secondary | ICD-10-CM

## 2016-04-17 ENCOUNTER — Encounter: Payer: Self-pay | Admitting: Hematology and Oncology

## 2016-04-17 ENCOUNTER — Inpatient Hospital Stay: Payer: Medicare Other | Attending: Hematology and Oncology

## 2016-04-17 ENCOUNTER — Inpatient Hospital Stay (HOSPITAL_BASED_OUTPATIENT_CLINIC_OR_DEPARTMENT_OTHER): Payer: Medicare Other | Admitting: Hematology and Oncology

## 2016-04-17 VITALS — BP 116/74 | HR 87 | Temp 98.9°F | Resp 18 | Wt 134.6 lb

## 2016-04-17 DIAGNOSIS — I10 Essential (primary) hypertension: Secondary | ICD-10-CM | POA: Diagnosis not present

## 2016-04-17 DIAGNOSIS — Z853 Personal history of malignant neoplasm of breast: Secondary | ICD-10-CM | POA: Diagnosis present

## 2016-04-17 DIAGNOSIS — Z923 Personal history of irradiation: Secondary | ICD-10-CM | POA: Diagnosis not present

## 2016-04-17 DIAGNOSIS — C50911 Malignant neoplasm of unspecified site of right female breast: Secondary | ICD-10-CM

## 2016-04-17 DIAGNOSIS — M810 Age-related osteoporosis without current pathological fracture: Secondary | ICD-10-CM | POA: Insufficient documentation

## 2016-04-17 DIAGNOSIS — Z888 Allergy status to other drugs, medicaments and biological substances status: Secondary | ICD-10-CM | POA: Diagnosis not present

## 2016-04-17 DIAGNOSIS — N644 Mastodynia: Secondary | ICD-10-CM

## 2016-04-17 DIAGNOSIS — Z9221 Personal history of antineoplastic chemotherapy: Secondary | ICD-10-CM | POA: Diagnosis not present

## 2016-04-17 DIAGNOSIS — Z79899 Other long term (current) drug therapy: Secondary | ICD-10-CM

## 2016-04-17 DIAGNOSIS — Z9223 Personal history of estrogen therapy: Secondary | ICD-10-CM

## 2016-04-17 DIAGNOSIS — Z170 Estrogen receptor positive status [ER+]: Secondary | ICD-10-CM | POA: Insufficient documentation

## 2016-04-17 DIAGNOSIS — Z801 Family history of malignant neoplasm of trachea, bronchus and lung: Secondary | ICD-10-CM | POA: Diagnosis not present

## 2016-04-17 LAB — COMPREHENSIVE METABOLIC PANEL
ALT: 14 U/L (ref 14–54)
AST: 19 U/L (ref 15–41)
Albumin: 4.2 g/dL (ref 3.5–5.0)
Alkaline Phosphatase: 77 U/L (ref 38–126)
Anion gap: 4 — ABNORMAL LOW (ref 5–15)
BUN: 10 mg/dL (ref 6–20)
CO2: 31 mmol/L (ref 22–32)
Calcium: 9.6 mg/dL (ref 8.9–10.3)
Chloride: 101 mmol/L (ref 101–111)
Creatinine, Ser: 0.69 mg/dL (ref 0.44–1.00)
GFR calc Af Amer: >60 mL/min (ref >60)
GFR calc non Af Amer: >60 mL/min (ref >60)
Glucose, Bld: 111 mg/dL — ABNORMAL HIGH (ref 65–99)
Potassium: 4.2 mmol/L (ref 3.5–5.1)
Sodium: 136 mmol/L (ref 135–145)
Total Bilirubin: 0.7 mg/dL (ref 0.3–1.2)
Total Protein: 7 g/dL (ref 6.5–8.1)

## 2016-04-17 LAB — CBC WITH DIFFERENTIAL/PLATELET
Basophils Absolute: 0 10*3/uL (ref 0–0.1)
Basophils Relative: 0 %
Eosinophils Absolute: 0 10*3/uL (ref 0–0.7)
Eosinophils Relative: 1 %
HCT: 41.9 % (ref 35.0–47.0)
Hemoglobin: 14 g/dL (ref 12.0–16.0)
Lymphocytes Relative: 18 %
Lymphs Abs: 1.4 10*3/uL (ref 1.0–3.6)
MCH: 32.6 pg (ref 26.0–34.0)
MCHC: 33.4 g/dL (ref 32.0–36.0)
MCV: 97.5 fL (ref 80.0–100.0)
Monocytes Absolute: 0.5 10*3/uL (ref 0.2–0.9)
Monocytes Relative: 6 %
Neutro Abs: 5.9 10*3/uL (ref 1.4–6.5)
Neutrophils Relative %: 75 %
Platelets: 239 10*3/uL (ref 150–440)
RBC: 4.3 MIL/uL (ref 3.80–5.20)
RDW: 13 % (ref 11.5–14.5)
WBC: 7.8 10*3/uL (ref 3.6–11.0)

## 2016-04-17 NOTE — Progress Notes (Signed)
[facility] day:  04/17/2016  Chief Complaint: Cynthia Walker is a 81 y.o. female with stage IA right breast cancer for reassessment.  HPI:  The patient presented in 2009 with right breast cancer following a screening mammogram. She underwent lumpectomy and sentinel lymph node biopsy in 04/2008 by Dr. Rochel Brome. Pathology revealed a 1.1 cm grade I invasive ductal carcinoma. Margins were negative. There was no lymphovascular invasion.  Tumor was ER and PR positive and HER-2 negative (2+ by IHC but FISH -).  Oncotype DX testing revealed an intermediate score of 21.  She received adjuvant chemotherapy.  Initial plan was for 4 cycles of Taxotere and Cytoxan (TC). She received TC 1. She states that halfway through the second cycle she became faint.  Notes indicate that she had an allergic reaction.  She was switched to Taxol and Cytoxan (no records available).  She received radiation. She received 5 years of Femara (10/26/2008 - 10/2013).  She was last seen in the medical oncology clinic on 11/30/2015 by [employer]. At that time she was noted to have osteoporosis but was not on a bisphosphonate. Fosamax was prescribed.  Bone density study was ordered.  Bone density study on 01/27/2014 revealed osteoporosis with a T score of -3.6 in the AP spine and -3.7 in the right femur. Bone density study on 04/02/2016 revealed a T score of -3.4 in the AP spine (L1-L4) and -3.2 in the right femur (improved).  Mammogram on 04/02/2016 was negative.  Symptomatically, she feels good.  She denies any concerns.   Past Medical History  Diagnosis Date  . Breast cancer [facility]) 2009    Right breast CA with lumpectomy, radiation and chemo tx's.  Marland Kitchen Hypertension     Past Surgical History  Procedure Laterality Date  . Breast excisional biopsy Right 2009  . Colonoscopy with propofol N/A 07/10/2015    Procedure: COLONOSCOPY WITH PROPOFOL;  Surgeon: Hulen Luster, MD;   Location: [facility] ENDOSCOPY;  Service: Gastroenterology;  Laterality: N/A;  . Tonsillectomy      age of 81 years old    Family History  Problem Relation Age of Onset  . Cancer Sister     not sure  . Cancer Brother     lung cancer    Social History:  reports that she has never smoked. She has never used smokeless tobacco. She reports that she does not drink alcohol or use illicit drugs.  The patient is accompanied by her husband, [name], today.  Allergies:  Allergies  Allergen Reactions  . Flu Virus Vaccine     Other reaction(s): Other (See Comments) Stomach cramps  . Isoflavones     Other reaction(s): Other (See Comments) Soy causes legs to feel like rubber  . Latex Swelling  . Other     Other reaction(s): Unknown Allergy to eggs  . Procaine Other (See Comments)    weakness  . Penicillins Rash    Current Medications: Current Outpatient Prescriptions  Medication Sig Dispense Refill  . alendronate (FOSAMAX) 70 MG tablet Take 1 tablet (70 mg total) by mouth once a week. Take with a full glass of water on an empty stomach. 4 tablet 6  . Calcium Carbonate-Vitamin D (CALTRATE 600+D) 600-400 MG-UNIT tablet Take 1 tablet by mouth daily.    Marland Kitchen dicyclomine (BENTYL) 10 MG capsule Take by mouth.    Marland Kitchen lisinopril (PRINIVIL,ZESTRIL) 10 MG tablet Take 10 mg by mouth daily.    . Multiple  Vitamin (MULTI-VITAMINS) TABS Take by mouth.     No current facility-administered medications for this visit.    Review of Systems:  GENERAL:  Feels good.  Active.  No fevers, sweats or weight loss. PERFORMANCE STATUS (ECOG):  0 HEENT:  No visual changes, runny nose, sore throat, mouth sores or tenderness. Lungs: No shortness of breath or cough.  No hemoptysis. Cardiac:  No chest pain, palpitations, orthopnea, or PND. GI:  No nausea, vomiting, diarrhea, constipation, melena or hematochezia. GU:  No urgency, frequency, dysuria, or hematuria. Musculoskeletal:  No back pain.  No joint pain.  No muscle  tenderness. Extremities:  No pain or swelling. Skin:  No rashes or skin changes. Neuro:  No headache, numbness or weakness, balance or coordination issues. Endocrine:  No diabetes, thyroid issues, hot flashes or night sweats. Psych:  No mood changes, depression or anxiety. Pain:  No focal pain. Review of systems:  All other systems reviewed and found to be negative.  Physical Exam: Blood pressure 116/74, pulse 87, temperature 98.9 F (37.2 C), temperature source Tympanic, resp. rate 18, weight 134 lb 9.5 oz (61.05 kg). GENERAL:  Well developed, well nourished, sitting comfortably in the exam room in no acute distress. MENTAL STATUS:  Alert and oriented to person, place and time. HEAD:  Curly gray hair.  Normocephalic, atraumatic, face symmetric, no Cushingoid features. EYES:  Brown eyes.  Pupils equal round and reactive to light and accomodation.  No conjunctivitis or scleral icterus. ENT:  Oropharynx clear without lesion.  Tongue normal. Mucous membranes moist.  RESPIRATORY:  Clear to auscultation without rales, wheezes or rhonchi. CARDIOVASCULAR:  Regular rate and rhythm without murmur, rub or gallop. BREAST:  Breasts very tender.  Right breast post op changes at 7 o'clock position.  No masses, skin changes or nipple discharge.  Left breast without masses, skin changes or nipple discharge.  ABDOMEN:  Soft, non-tender, with active bowel sounds, and no hepatosplenomegaly.  No masses. SKIN:  No rashes, ulcers or lesions. EXTREMITIES: No edema, no skin discoloration or tenderness.  No palpable cords. LYMPH NODES: No palpable cervical, supraclavicular, axillary or inguinal adenopathy  NEUROLOGICAL: Unremarkable. PSYCH:  Appropriate.  Clinical Support on 04/17/2016  Component Date Value Ref Range Status  . WBC 04/17/2016 7.8  3.6 - 11.0 K/uL Final   A-LINE DRAW  . RBC 04/17/2016 4.30  3.80 - 5.20 MIL/uL Final  . Hemoglobin 04/17/2016 14.0  12.0 - 16.0 g/dL Final  . HCT 04/17/2016 41.9   35.0 - 47.0 % Final  . MCV 04/17/2016 97.5  80.0 - 100.0 fL Final  . MCH 04/17/2016 32.6  26.0 - 34.0 pg Final  . MCHC 04/17/2016 33.4  32.0 - 36.0 g/dL Final  . RDW 04/17/2016 13.0  11.5 - 14.5 % Final  . Platelets 04/17/2016 239  150 - 440 K/uL Final  . Neutrophils Relative % 04/17/2016 75   Final  . Neutro Abs 04/17/2016 5.9  1.4 - 6.5 K/uL Final  . Lymphocytes Relative 04/17/2016 18   Final  . Lymphs Abs 04/17/2016 1.4  1.0 - 3.6 K/uL Final  . Monocytes Relative 04/17/2016 6   Final  . Monocytes Absolute 04/17/2016 0.5  0.2 - 0.9 K/uL Final  . Eosinophils Relative 04/17/2016 1   Final  . Eosinophils Absolute 04/17/2016 0.0  0 - 0.7 K/uL Final  . Basophils Relative 04/17/2016 0   Final  . Basophils Absolute 04/17/2016 0.0  0 - 0.1 K/uL Final  . Sodium 04/17/2016 136  135 -  145 mmol/L Final  . Potassium 04/17/2016 4.2  3.5 - 5.1 mmol/L Final  . Chloride 04/17/2016 101  101 - 111 mmol/L Final  . CO2 04/17/2016 31  22 - 32 mmol/L Final  . Glucose, Bld 04/17/2016 111* 65 - 99 mg/dL Final  . BUN 04/17/2016 10  6 - 20 mg/dL Final  . Creatinine, Ser 04/17/2016 0.69  0.44 - 1.00 mg/dL Final  . Calcium 04/17/2016 9.6  8.9 - 10.3 mg/dL Final  . Total Protein 04/17/2016 7.0  6.5 - 8.1 g/dL Final  . Albumin 04/17/2016 4.2  3.5 - 5.0 g/dL Final  . AST 04/17/2016 19  15 - 41 U/L Final  . ALT 04/17/2016 14  14 - 54 U/L Final  . Alkaline Phosphatase 04/17/2016 77  38 - 126 U/L Final  . Total Bilirubin 04/17/2016 0.7  0.3 - 1.2 mg/dL Final  . GFR calc non Af Amer 04/17/2016 >60  >60 mL/min Final  . GFR calc Af Amer 04/17/2016 >60  >60 mL/min Final   Comment: (NOTE) The eGFR has been calculated using the CKD EPI equation. This calculation has not been validated in all clinical situations. eGFR's persistently <60 mL/min signify possible Chronic Kidney Disease.   . Anion gap 04/17/2016 4* 5 - 15 Final    Assessment:  Betzy ETHELL BLATCHFORD is a 81 y.o. female with stage IA right breast cancer s/p  lumpectomy and sentinel lymph node biopsy in 04/2008. Pathology revealed a 1.1 cm grade I invasive ductal carcinoma. Margins were negative. There was no lymphovascular invasion.  Stage was T1cN0M0.  Tumor was ER and PR positive and HER-2 negative (2+ by IHC but FISH -).  Oncotype DX testing revealed an intermediate score of 21.  She received adjuvant chemotherapy.  Chemotherapy was truncated secondary to an allergic reaction to Taxotere.  She received TC 1 followed by weekly  Taxol and Cytoxan (no records available).  She received radiation. She received 5 years of Femara (10/26/2008 - 10/2013).  Mammogram on 04/02/2016 was negative.  Bone density study on 01/27/2014 revealed osteoporosis with a T score of -3.6 in the AP spine and -3.7 in the right femur. Bone density study on 04/02/2016 revealed a T score of -3.4 in the AP spine (L1-L4) and -3.2 in the right femur (improved).    Symptomatically, she feels good.  Exam reveals tender breasts.  Plan: 1.  Review entire medical history, diagnosis and management of breast cancer.  Discuss plan for yearly assessment unless any concerns. 2.  Discuss osteoporosis.  Discuss improvement in recent bone denisty study.  Discuss continuation of Fosamax, calcium, and vitamin D. 3.  Bilateral mammogram 04/02/2017 4.  Obtain records from chemotherapy and radiation. 5.  RTC in 1 year for MD assessment, labs (CBC with diff, CMP, CA27.29), and review of mammogram.   Lequita Asal, MD  04/17/2016, 2:46 PM

## 2016-04-17 NOTE — Progress Notes (Signed)
Patient is here for follow up. No complaints

## 2017-04-03 ENCOUNTER — Ambulatory Visit
Admission: RE | Admit: 2017-04-03 | Discharge: 2017-04-03 | Disposition: A | Discharge status: Home / Self Care | Payer: Medicare Other | Source: Ambulatory Visit | Attending: Hematology and Oncology | Admitting: Hematology and Oncology

## 2017-04-03 ENCOUNTER — Other Ambulatory Visit: Payer: Self-pay | Admitting: Hematology and Oncology

## 2017-04-03 DIAGNOSIS — M810 Age-related osteoporosis without current pathological fracture: Secondary | ICD-10-CM | POA: Insufficient documentation

## 2017-04-03 DIAGNOSIS — C50911 Malignant neoplasm of unspecified site of right female breast: Secondary | ICD-10-CM | POA: Insufficient documentation

## 2017-04-03 DIAGNOSIS — Z1231 Encounter for screening mammogram for malignant neoplasm of breast: Secondary | ICD-10-CM | POA: Diagnosis not present

## 2017-04-03 HISTORY — DX: Personal history of irradiation: Z92.3

## 2017-04-03 HISTORY — DX: Personal history of antineoplastic chemotherapy: Z92.21

## 2017-04-16 ENCOUNTER — Inpatient Hospital Stay: Payer: Medicare Other | Admitting: Hematology and Oncology

## 2017-04-16 ENCOUNTER — Inpatient Hospital Stay: Payer: Medicare Other | Attending: Internal Medicine

## 2017-04-16 NOTE — Progress Notes (Unsigned)
[facility] day:  04/16/17  Chief Complaint: Cynthia Walker is a 82 y.o. female with stage IA right breast cancer for 1 year assessment.  HPI:  The patient was last seen in the medical oncology clinic on 04/17/2016.  At that time, she was seen for initial assessment. Symptomatically, she felt good.  Exam revealed tender breasts.  CBC and CMP were normal.  We discussed continuation of Fosamax, calcium, and vitamin D for known osteoporosis.  Bilateral screening mammogram on 04/03/2017 revealed no evidence of malignancy.  Symptomatically,    Past Medical History:  Diagnosis Date  . Breast cancer [facility]) 2009   Right breast CA with lumpectomy, radiation and chemo tx's.  Marland Kitchen Hypertension   . Personal history of chemotherapy   . Personal history of radiation therapy     Past Surgical History:  Procedure Laterality Date  . BREAST EXCISIONAL BIOPSY Right 2009  . COLONOSCOPY WITH PROPOFOL N/A 07/10/2015   Procedure: COLONOSCOPY WITH PROPOFOL;  Surgeon: Hulen Luster, MD;  Location: [facility] ENDOSCOPY;  Service: Gastroenterology;  Laterality: N/A;  . TONSILLECTOMY     age of 82 years old    Family History  Problem Relation Age of Onset  . Cancer Brother        lung cancer  . Cancer Sister        not sure  . Breast cancer Neg Hx     Social History:  reports that she has never smoked. She has never used smokeless tobacco. She reports that she does not drink alcohol or use drugs.  The patient is accompanied by her husband, Cynthia Walker, today.  Allergies:  Allergies  Allergen Reactions  . Flu Virus Vaccine     Other reaction(s): Other (See Comments) Stomach cramps  . Isoflavones     Other reaction(s): Other (See Comments) Soy causes legs to feel like rubber  . Latex Swelling  . Other     Other reaction(s): Unknown Allergy to eggs  . Procaine Other (See Comments)    weakness  . Penicillins Rash    Current Medications: Current Outpatient  Prescriptions  Medication Sig Dispense Refill  . alendronate (FOSAMAX) 70 MG tablet Take 1 tablet (70 mg total) by mouth once a week. Take with a full glass of water on an empty stomach. 4 tablet 6  . Calcium Carbonate-Vitamin D (CALTRATE 600+D) 600-400 MG-UNIT tablet Take 1 tablet by mouth daily.    Marland Kitchen lisinopril (PRINIVIL,ZESTRIL) 10 MG tablet Take 10 mg by mouth daily.    . Multiple Vitamin (MULTI-VITAMINS) TABS Take by mouth.     No current facility-administered medications for this visit.     Review of Systems:  GENERAL:  Feels good.  Active.  No fevers, sweats or weight loss. PERFORMANCE STATUS (ECOG):  0 HEENT:  No visual changes, runny nose, sore throat, mouth sores or tenderness. Lungs: No shortness of breath or cough.  No hemoptysis. Cardiac:  No chest pain, palpitations, orthopnea, or PND. GI:  No nausea, vomiting, diarrhea, constipation, melena or hematochezia. GU:  No urgency, frequency, dysuria, or hematuria. Musculoskeletal:  No back pain.  No joint pain.  No muscle tenderness. Extremities:  No pain or swelling. Skin:  No rashes or skin changes. Neuro:  No headache, numbness or weakness, balance or coordination issues. Endocrine:  No diabetes, thyroid issues, hot flashes or night sweats. Psych:  No mood changes, depression or anxiety. Pain:  No focal pain. Review of systems:  All other  systems reviewed and found to be negative.  Physical Exam: There were no vitals taken for this visit. GENERAL:  Well developed, well nourished, sitting comfortably in the exam room in no acute distress. MENTAL STATUS:  Alert and oriented to person, place and time. HEAD:  Curly gray hair.  Normocephalic, atraumatic, face symmetric, no Cushingoid features. EYES:  Brown eyes.  Pupils equal round and reactive to light and accomodation.  No conjunctivitis or scleral icterus. ENT:  Oropharynx clear without lesion.  Tongue normal. Mucous membranes moist.  RESPIRATORY:  Clear to auscultation  without rales, wheezes or rhonchi. CARDIOVASCULAR:  Regular rate and rhythm without murmur, rub or gallop. BREAST:  Breasts very tender.  Right breast post op changes at 7 o'clock position.  No masses, skin changes or nipple discharge.  Left breast without masses, skin changes or nipple discharge.  ABDOMEN:  Soft, non-tender, with active bowel sounds, and no hepatosplenomegaly.  No masses. SKIN:  No rashes, ulcers or lesions. EXTREMITIES: No edema, no skin discoloration or tenderness.  No palpable cords. LYMPH NODES: No palpable cervical, supraclavicular, axillary or inguinal adenopathy  NEUROLOGICAL: Unremarkable. PSYCH:  Appropriate.  No visits with results within 3 Day(s) from this visit.  Latest known visit with results is:  Clinical Support on 04/17/2016  Component Date Value Ref Range Status  . WBC 04/17/2016 7.8  3.6 - 11.0 K/uL Final   A-LINE DRAW  . RBC 04/17/2016 4.30  3.80 - 5.20 MIL/uL Final  . Hemoglobin 04/17/2016 14.0  12.0 - 16.0 g/dL Final  . HCT 04/17/2016 41.9  35.0 - 47.0 % Final  . MCV 04/17/2016 97.5  80.0 - 100.0 fL Final  . MCH 04/17/2016 32.6  26.0 - 34.0 pg Final  . MCHC 04/17/2016 33.4  32.0 - 36.0 g/dL Final  . RDW 04/17/2016 13.0  11.5 - 14.5 % Final  . Platelets 04/17/2016 239  150 - 440 K/uL Final  . Neutrophils Relative % 04/17/2016 75  % Final  . Neutro Abs 04/17/2016 5.9  1.4 - 6.5 K/uL Final  . Lymphocytes Relative 04/17/2016 18  % Final  . Lymphs Abs 04/17/2016 1.4  1.0 - 3.6 K/uL Final  . Monocytes Relative 04/17/2016 6  % Final  . Monocytes Absolute 04/17/2016 0.5  0.2 - 0.9 K/uL Final  . Eosinophils Relative 04/17/2016 1  % Final  . Eosinophils Absolute 04/17/2016 0.0  0 - 0.7 K/uL Final  . Basophils Relative 04/17/2016 0  % Final  . Basophils Absolute 04/17/2016 0.0  0 - 0.1 K/uL Final  . Sodium 04/17/2016 136  135 - 145 mmol/L Final  . Potassium 04/17/2016 4.2  3.5 - 5.1 mmol/L Final  . Chloride 04/17/2016 101  101 - 111 mmol/L Final  .  CO2 04/17/2016 31  22 - 32 mmol/L Final  . Glucose, Bld 04/17/2016 111* 65 - 99 mg/dL Final  . BUN 04/17/2016 10  6 - 20 mg/dL Final  . Creatinine, Ser 04/17/2016 0.69  0.44 - 1.00 mg/dL Final  . Calcium 04/17/2016 9.6  8.9 - 10.3 mg/dL Final  . Total Protein 04/17/2016 7.0  6.5 - 8.1 g/dL Final  . Albumin 04/17/2016 4.2  3.5 - 5.0 g/dL Final  . AST 04/17/2016 19  15 - 41 U/L Final  . ALT 04/17/2016 14  14 - 54 U/L Final  . Alkaline Phosphatase 04/17/2016 77  38 - 126 U/L Final  . Total Bilirubin 04/17/2016 0.7  0.3 - 1.2 mg/dL Final  . GFR calc non Af  Amer 04/17/2016 >60  >60 mL/min Final  . GFR calc Af Amer 04/17/2016 >60  >60 mL/min Final   Comment: (NOTE) The eGFR has been calculated using the CKD EPI equation. This calculation has not been validated in all clinical situations. eGFR's persistently <60 mL/min signify possible Chronic Kidney Disease.   . Anion gap 04/17/2016 4* 5 - 15 Final    Assessment:  Shawnte ILSE BILLMAN is a 82 y.o. female with stage IA right breast cancer s/p lumpectomy and sentinel lymph node biopsy in 04/2008. Pathology revealed a 1.1 cm grade I invasive ductal carcinoma. Margins were negative. There was no lymphovascular invasion.  Stage was T1cN0M0.  Tumor was ER and PR positive and HER-2 negative (2+ by IHC but FISH -).  Oncotype DX testing revealed an intermediate score of 21.  She received adjuvant chemotherapy.  Chemotherapy was truncated secondary to an allergic reaction to Taxotere.  She received TC 1 followed by weekly Taxol and Cytoxan (no records available).  She received radiation. She received 5 years of Femara (10/26/2008 - 10/2013).  Bilateral screening mammogram on 04/03/2017 revealed no evidence of malignancy.  Bone density study on 01/27/2014 revealed osteoporosis with a T score of -3.6 in the AP spine and -3.7 in the right femur. Bone density study on 04/02/2016 revealed a T score of -3.4 in the AP spine (L1-L4) and -3.2 in the right femur  (improved).    Symptomatically, she feels good.  Exam reveals tender breasts.  Plan: 1.  Labs today:  CBC with diff, CMP, CA27.29. 2.  Review interval mammogram.  No evidence of malignancy.  Discuss continuation of yearly mammogram.  Review entire medical history, diagnosis and management of breast cancer.  Discuss plan for yearly assessment unless any concerns. 2.  Discuss osteoporosis.  Discuss improvement in recent bone denisty study.  Discuss continuation of Fosamax, calcium, and vitamin D. 3.  Bilateral screening mammogram 04/03/2018. 4.  Obtain records from chemotherapy and radiation. 5.  RTC in 1 year for MD assessment, labs (CBC with diff, CMP, CA27.29), and review of mammogram.   Lequita Asal, MD  04/16/2017, 5:39 AM

## 2017-04-22 ENCOUNTER — Other Ambulatory Visit: Payer: Self-pay | Admitting: *Deleted

## 2017-04-22 DIAGNOSIS — Z853 Personal history of malignant neoplasm of breast: Secondary | ICD-10-CM

## 2017-04-23 ENCOUNTER — Inpatient Hospital Stay: Payer: Medicare Other

## 2017-04-23 ENCOUNTER — Other Ambulatory Visit: Payer: Self-pay | Admitting: *Deleted

## 2017-04-23 ENCOUNTER — Encounter: Payer: Self-pay | Admitting: Hematology and Oncology

## 2017-04-23 ENCOUNTER — Inpatient Hospital Stay: Payer: Medicare Other | Attending: Hematology and Oncology | Admitting: Hematology and Oncology

## 2017-04-23 VITALS — BP 111/72 | HR 76 | Temp 97.7°F | Ht 64.0 in | Wt 135.7 lb

## 2017-04-23 DIAGNOSIS — Z853 Personal history of malignant neoplasm of breast: Secondary | ICD-10-CM

## 2017-04-23 DIAGNOSIS — Z809 Family history of malignant neoplasm, unspecified: Secondary | ICD-10-CM

## 2017-04-23 DIAGNOSIS — K5900 Constipation, unspecified: Secondary | ICD-10-CM | POA: Insufficient documentation

## 2017-04-23 DIAGNOSIS — I10 Essential (primary) hypertension: Secondary | ICD-10-CM | POA: Diagnosis not present

## 2017-04-23 DIAGNOSIS — N644 Mastodynia: Secondary | ICD-10-CM | POA: Diagnosis not present

## 2017-04-23 DIAGNOSIS — Z801 Family history of malignant neoplasm of trachea, bronchus and lung: Secondary | ICD-10-CM | POA: Insufficient documentation

## 2017-04-23 DIAGNOSIS — Z170 Estrogen receptor positive status [ER+]: Secondary | ICD-10-CM | POA: Diagnosis not present

## 2017-04-23 DIAGNOSIS — Z9223 Personal history of estrogen therapy: Secondary | ICD-10-CM | POA: Diagnosis not present

## 2017-04-23 DIAGNOSIS — Z923 Personal history of irradiation: Secondary | ICD-10-CM

## 2017-04-23 DIAGNOSIS — Z888 Allergy status to other drugs, medicaments and biological substances status: Secondary | ICD-10-CM | POA: Diagnosis not present

## 2017-04-23 DIAGNOSIS — Z79899 Other long term (current) drug therapy: Secondary | ICD-10-CM | POA: Insufficient documentation

## 2017-04-23 DIAGNOSIS — M810 Age-related osteoporosis without current pathological fracture: Secondary | ICD-10-CM | POA: Diagnosis not present

## 2017-04-23 DIAGNOSIS — C50911 Malignant neoplasm of unspecified site of right female breast: Secondary | ICD-10-CM

## 2017-04-23 DIAGNOSIS — Z9221 Personal history of antineoplastic chemotherapy: Secondary | ICD-10-CM

## 2017-04-23 LAB — COMPREHENSIVE METABOLIC PANEL
ALT: 14 U/L (ref 14–54)
AST: 22 U/L (ref 15–41)
Albumin: 4.1 g/dL (ref 3.5–5.0)
Alkaline Phosphatase: 68 U/L (ref 38–126)
Anion gap: 6 (ref 5–15)
BUN: 17 mg/dL (ref 6–20)
CO2: 29 mmol/L (ref 22–32)
Calcium: 9.1 mg/dL (ref 8.9–10.3)
Chloride: 100 mmol/L — ABNORMAL LOW (ref 101–111)
Creatinine, Ser: 0.74 mg/dL (ref 0.44–1.00)
GFR calc Af Amer: >60 mL/min (ref >60)
GFR calc non Af Amer: >60 mL/min (ref >60)
Glucose, Bld: 99 mg/dL (ref 65–99)
Potassium: 4.6 mmol/L (ref 3.5–5.1)
Sodium: 135 mmol/L (ref 135–145)
Total Bilirubin: 0.6 mg/dL (ref 0.3–1.2)
Total Protein: 7 g/dL (ref 6.5–8.1)

## 2017-04-23 LAB — CBC WITH DIFFERENTIAL/PLATELET
Basophils Absolute: 0 10*3/uL (ref 0–0.1)
Basophils Relative: 1 %
Eosinophils Absolute: 0.1 10*3/uL (ref 0–0.7)
Eosinophils Relative: 1 %
HCT: 38.7 % (ref 35.0–47.0)
Hemoglobin: 13.1 g/dL (ref 12.0–16.0)
Lymphocytes Relative: 23 %
Lymphs Abs: 1.2 10*3/uL (ref 1.0–3.6)
MCH: 32.6 pg (ref 26.0–34.0)
MCHC: 34 g/dL (ref 32.0–36.0)
MCV: 96 fL (ref 80.0–100.0)
Monocytes Absolute: 0.5 10*3/uL (ref 0.2–0.9)
Monocytes Relative: 9 %
Neutro Abs: 3.6 10*3/uL (ref 1.4–6.5)
Neutrophils Relative %: 66 %
Platelets: 252 10*3/uL (ref 150–440)
RBC: 4.03 MIL/uL (ref 3.80–5.20)
RDW: 12.8 % (ref 11.5–14.5)
WBC: 5.3 10*3/uL (ref 3.6–11.0)

## 2017-04-23 NOTE — Progress Notes (Signed)
Patient here for follow up no changes since last appointment 

## 2017-04-23 NOTE — Progress Notes (Signed)
[facility] day:  04/23/17  Chief Complaint: Cynthia Walker is a 82 y.o. female with stage IA right breast cancer for 1 year assessment.  HPI:  The patient was last seen in the medical oncology clinic on 04/17/2016.  At that time, she was seen for initial assessment.  Symptomatically, she felt good.  Exam revealed tender breasts.  CBC and CMP were normal.  We discussed continuation of Fosamax, calcium, and vitamin D for known osteoporosis.  She states that she stopped Fosamax in 2017.  Bilateral screening mammogram on 04/03/2017 revealed no evidence of malignancy.  Symptomatically, she denies any complaints.   Past Medical History:  Diagnosis Date  . Breast cancer [facility]) 2009   Right breast CA with lumpectomy, radiation and chemo tx's.  Marland Kitchen Hypertension   . Personal history of chemotherapy   . Personal history of radiation therapy     Past Surgical History:  Procedure Laterality Date  . BREAST EXCISIONAL BIOPSY Right 2009  . COLONOSCOPY WITH PROPOFOL N/A 07/10/2015   Procedure: COLONOSCOPY WITH PROPOFOL;  Surgeon: Hulen Luster, MD;  Location: [facility] ENDOSCOPY;  Service: Gastroenterology;  Laterality: N/A;  . TONSILLECTOMY     age of 82 years old    Family History  Problem Relation Age of Onset  . Cancer Brother        lung cancer  . Cancer Sister        not sure  . Breast cancer Neg Hx     Social History:  reports that she has never smoked. She has never used smokeless tobacco. She reports that she does not drink alcohol or use drugs.  Her husband's name is [name].  She lives in [place].  Allergies:  Allergies  Allergen Reactions  . Flu Virus Vaccine     Other reaction(s): Other (See Comments) Stomach cramps  . Isoflavones     Other reaction(s): Other (See Comments) Soy causes legs to feel like rubber  . Latex Swelling  . Other     Other reaction(s): Unknown Allergy to eggs  . Procaine Other (See Comments)    weakness  .  Penicillins Rash    Current Medications: Current Outpatient Prescriptions  Medication Sig Dispense Refill  . Calcium Carbonate-Vitamin D (CALTRATE 600+D) 600-400 MG-UNIT tablet Take 1 tablet by mouth daily.    Marland Kitchen lisinopril (PRINIVIL,ZESTRIL) 10 MG tablet Take 10 mg by mouth daily.    . Multiple Vitamin (MULTI-VITAMINS) TABS Take by mouth.     No current facility-administered medications for this visit.     Review of Systems:  GENERAL:  Feels good.  No fevers, sweats or weight loss. PERFORMANCE STATUS (ECOG):  0 HEENT:  No visual changes, runny nose, sore throat, mouth sores or tenderness. Lungs: No shortness of breath or cough.  No hemoptysis. Cardiac:  No chest pain, palpitations, orthopnea, or PND. GI:  Occasional constipation.  No nausea, vomiting, diarrhea, melena or hematochezia. GU:  No urgency, frequency, dysuria, or hematuria. Musculoskeletal:  No back pain.  No joint pain.  No muscle tenderness. Extremities:  No pain or swelling. Skin:  No rashes or skin changes. Neuro:  No headache, numbness or weakness, balance or coordination issues. Endocrine:  No diabetes, thyroid issues, hot flashes or night sweats. Psych:  No mood changes, depression or anxiety. Pain:  No focal pain. Review of systems:  All other systems reviewed and found to be negative.  Physical Exam: Blood pressure 111/72, pulse 76, temperature 97.7 F (  36.5 C), temperature source Tympanic, height '5\' 4"'$  (1.626 m), weight 135 lb 11.1 oz (61.5 kg). GENERAL:  Well developed, well nourished, woman sitting comfortably in the exam room in no acute distress. MENTAL STATUS:  Alert and oriented to person, place and time. HEAD:  Short styled gray hair.  Normocephalic, atraumatic, face symmetric, no Cushingoid features. EYES:  Brown eyes.  Pupils equal round and reactive to light and accomodation.  No conjunctivitis or scleral icterus. ENT:  Oropharynx clear without lesion.  Tongue normal. Mucous membranes moist.   RESPIRATORY:  Clear to auscultation without rales, wheezes or rhonchi. CARDIOVASCULAR:  Regular rate and rhythm without murmur, rub or gallop. BREAST:  Breasts tender.  Right breast post op changes at 7 o'clock position (stable).  No masses, skin changes or nipple discharge.  Left breast without masses, skin changes or nipple discharge.  ABDOMEN:  Soft, non-tender, with active bowel sounds, and no hepatosplenomegaly.  No masses. SKIN:  No rashes, ulcers or lesions. EXTREMITIES: No edema, no skin discoloration or tenderness.  No palpable cords. LYMPH NODES: No palpable cervical, supraclavicular, axillary or inguinal adenopathy  NEUROLOGICAL: Unremarkable. PSYCH:  Appropriate.   Appointment on 04/23/2017  Component Date Value Ref Range Status  . WBC 04/23/2017 5.3  3.6 - 11.0 K/uL Final  . RBC 04/23/2017 4.03  3.80 - 5.20 MIL/uL Final  . Hemoglobin 04/23/2017 13.1  12.0 - 16.0 g/dL Final  . HCT 04/23/2017 38.7  35.0 - 47.0 % Final  . MCV 04/23/2017 96.0  80.0 - 100.0 fL Final  . MCH 04/23/2017 32.6  26.0 - 34.0 pg Final  . MCHC 04/23/2017 34.0  32.0 - 36.0 g/dL Final  . RDW 04/23/2017 12.8  11.5 - 14.5 % Final  . Platelets 04/23/2017 252  150 - 440 K/uL Final  . Neutrophils Relative % 04/23/2017 66  % Final  . Neutro Abs 04/23/2017 3.6  1.4 - 6.5 K/uL Final  . Lymphocytes Relative 04/23/2017 23  % Final  . Lymphs Abs 04/23/2017 1.2  1.0 - 3.6 K/uL Final  . Monocytes Relative 04/23/2017 9  % Final  . Monocytes Absolute 04/23/2017 0.5  0.2 - 0.9 K/uL Final  . Eosinophils Relative 04/23/2017 1  % Final  . Eosinophils Absolute 04/23/2017 0.1  0 - 0.7 K/uL Final  . Basophils Relative 04/23/2017 1  % Final  . Basophils Absolute 04/23/2017 0.0  0 - 0.1 K/uL Final    Assessment:  Cynthia Walker is a 82 y.o. female with stage IA right breast cancer s/p lumpectomy and sentinel lymph node biopsy in 04/2008. Pathology revealed a 1.1 cm grade I invasive ductal carcinoma. Margins were  negative. There was no lymphovascular invasion.  Stage was T1cN0M0.  Tumor was ER and PR positive and HER-2 negative (2+ by IHC but FISH -).  Oncotype DX testing revealed an intermediate score of 21.  She received adjuvant chemotherapy.  Chemotherapy was truncated secondary to an allergic reaction to Taxotere.  She received TC 1 followed by weekly Taxol and Cytoxan (no records available).  She received radiation. She received 5 years of Femara (10/26/2008 - 10/2013).  CA27.29 has been followed: 16.4 on 06/20/2011, 23.4 on 01/16/2012, 20.7 on 07/23/2012, 23 on 01/11/2013, 22.5 on 11/25/2013, and 20.9 on 04/23/2017.  Bilateral screening mammogram on 04/03/2017 revealed no evidence of malignancy.  Bone density study on 01/27/2014 revealed osteoporosis with a T score of -3.6 in the AP spine and -3.7 in the right femur. Bone density study on 04/02/2016 revealed a  T score of -3.4 in the AP spine (L1-L4) and -3.2 in the right femur (improved).    She stopped Fosamax in 2017.  Symptomatically, she feels good.  Exam is stable.  Plan: 1.  Labs today:  CBC with diff, CMP, CA27.29. 2.  Review interval mammogram.  No evidence of malignancy.  Discuss continuation of yearly mammogram. 3.  Discuss osteoporosis on last bone density.  Patient commented that she had stopped Fosamax in 2017.  Discuss need for calcium and vitamin D.  Consider Prolia,  Discuss risk for fracture. 4.  Bilateral mammogram 04/03/2018. 5.  Bone density 04/03/2018 6.  RTC in 1 year for MD assessment, labs (CBC with diff, CMP, CA27.29), and review of imaging.   Lequita Asal, MD  04/23/2017, 10:45 AM

## 2017-05-04 LAB — CA 27.29 (SERIAL MONITOR): CA 27.29: 20.9 U/mL (ref 0.0–38.6)

## 2017-05-05 ENCOUNTER — Telehealth: Payer: Self-pay | Admitting: *Deleted

## 2017-05-05 NOTE — Telephone Encounter (Signed)
-----   Message from Lequita Asal, MD sent at 05/04/2017  4:50 AM EDT ----- Regarding: Please call patient  CA27.29 normal.  M  ----- Message ----- From: Interface, Lab [place] Sent: 04/23/2017  10:14 AM To: Lequita Asal, MD

## 2017-05-05 NOTE — Telephone Encounter (Signed)
Called patient to inform her that Ca 27.29 is normal.

## 2017-07-11 ENCOUNTER — Emergency Department: Payer: Medicare Other

## 2017-07-11 ENCOUNTER — Encounter: Payer: Self-pay | Admitting: Emergency Medicine

## 2017-07-11 ENCOUNTER — Emergency Department
Admission: EM | Admit: 2017-07-11 | Discharge: 2017-07-11 | Disposition: A | Discharge status: Home / Self Care | Payer: Medicare Other | Attending: Emergency Medicine | Admitting: Emergency Medicine

## 2017-07-11 DIAGNOSIS — R079 Chest pain, unspecified: Secondary | ICD-10-CM | POA: Diagnosis not present

## 2017-07-11 DIAGNOSIS — Z853 Personal history of malignant neoplasm of breast: Secondary | ICD-10-CM | POA: Diagnosis not present

## 2017-07-11 DIAGNOSIS — I10 Essential (primary) hypertension: Secondary | ICD-10-CM | POA: Diagnosis not present

## 2017-07-11 DIAGNOSIS — Z79899 Other long term (current) drug therapy: Secondary | ICD-10-CM | POA: Diagnosis not present

## 2017-07-11 LAB — TROPONIN I: Troponin I: <0.03 ng/mL (ref <0.03)

## 2017-07-11 LAB — BASIC METABOLIC PANEL
Anion gap: 7 (ref 5–15)
BUN: 14 mg/dL (ref 6–20)
CHLORIDE: 103 mmol/L (ref 101–111)
CO2: 26 mmol/L (ref 22–32)
CREATININE: 0.81 mg/dL (ref 0.44–1.00)
Calcium: 8.8 mg/dL — ABNORMAL LOW (ref 8.9–10.3)
Glucose, Bld: 99 mg/dL (ref 65–99)
POTASSIUM: 4.1 mmol/L (ref 3.5–5.1)
Sodium: 136 mmol/L (ref 135–145)

## 2017-07-11 LAB — CBC
HCT: 40.5 % (ref 35.0–47.0)
Hemoglobin: 13.9 g/dL (ref 12.0–16.0)
MCH: 33.3 pg (ref 26.0–34.0)
MCHC: 34.3 g/dL (ref 32.0–36.0)
MCV: 97.1 fL (ref 80.0–100.0)
PLATELETS: 249 10*3/uL (ref 150–440)
RBC: 4.18 MIL/uL (ref 3.80–5.20)
RDW: 13.3 % (ref 11.5–14.5)
WBC: 9.1 10*3/uL (ref 3.6–11.0)

## 2017-07-11 MED ORDER — IBUPROFEN 600 MG PO TABS
600.0000 mg | ORAL_TABLET | Freq: Once | ORAL | Status: AC
  Administered 2017-07-11: 600 mg via ORAL
  Filled 2017-07-11: qty 1

## 2017-07-11 NOTE — ED Provider Notes (Signed)
[facility] Emergency Department Provider Note ____________________________________________   I have reviewed the triage vital signs and the triage nursing note.  HISTORY  Chief Complaint Chest Pain   Historian Patient  HPI Cynthia Walker is a 82 y.o. female presents from home with her husband, reports woke up around 4 or so with feeling of golf ball lump at the base of her neck, upper chest.  No problems swallowing or breathing, but feeling of fullness.  Never had this before.  History of esophagus stretched many years ago, but not been noting any trouble eating or swallowing.  Denies hives, tongue or lip/face.  No itching.  No hx of CAD, but htn, on lisinopril.  Symptoms lasted a few hours -- came in at [facility] asa/acetaminophin prior to arrival.  Symptoms gone now per patient.    Past Medical History:  Diagnosis Date  . Breast cancer [facility]) 2009   Right breast CA with lumpectomy, radiation and chemo tx's.  Marland Kitchen Hypertension   . Personal history of chemotherapy   . Personal history of radiation therapy     Patient Active Problem List   Diagnosis Date Noted  . Osteoporosis 04/02/2016  . Breast cancer, right ([place]) 04/06/2008    Past Surgical History:  Procedure Laterality Date  . BREAST EXCISIONAL BIOPSY Right 2009  . COLONOSCOPY WITH PROPOFOL N/A 07/10/2015   Procedure: COLONOSCOPY WITH PROPOFOL;  Surgeon: Hulen Luster, MD;  Location: [facility] ENDOSCOPY;  Service: Gastroenterology;  Laterality: N/A;  . TONSILLECTOMY     age of 82 years old    Prior to Admission medications   Medication Sig Start Date End Date Taking? Authorizing Provider  Calcium Carbonate-Vitamin D (CALTRATE 600+D) 600-400 MG-UNIT tablet Take 1 tablet by mouth daily.   Yes [provider]  lisinopril (PRINIVIL,ZESTRIL) 10 MG tablet Take 10 mg by mouth daily.   Yes [provider]  Multiple Vitamin (MULTI-VITAMINS) TABS Take by mouth.   Yes [provider]    Allergies  Allergen Reactions  . Flu Virus Vaccine     Other reaction(s): Other (See Comments) Stomach cramps  . Isoflavones     Other reaction(s): Other (See Comments) Soy causes legs to feel like rubber  . Latex Swelling  . Other     Other reaction(s): Unknown Allergy to eggs  . Procaine Other (See Comments)    weakness  . Penicillins Rash    Has patient had a PCN reaction causing immediate rash, facial/tongue/throat swelling, SOB or lightheadedness with hypotension: No Has patient had a PCN reaction causing severe rash involving mucus membranes or skin necrosis: No Has patient had a PCN reaction that required hospitalization: No Has patient had a PCN reaction occurring within the last 10 years: No If all of the above answers are "NO", then may proceed with Cephalosporin use.     Family History  Problem Relation Age of Onset  . Cancer Brother        lung cancer  . Cancer Sister        not sure  . Breast cancer Neg Hx     Social History Social History  Substance Use Topics  . Smoking status: Never Smoker  . Smokeless tobacco: Never Used  . Alcohol use No    Review of Systems  Constitutional: Negative for fever. Eyes: Negative for visual changes. ENT: Negative for sore throat. Cardiovascular: Upper chest fullness as described in HPI Respiratory: Negative for shortness of breath. Gastrointestinal: Negative for abdominal  pain, vomiting and diarrhea. Genitourinary: Negative for dysuria. Musculoskeletal: Negative for back pain. Skin: Negative for rash. Neurological: Negative for headache.  ____________________________________________   PHYSICAL EXAM:  VITAL SIGNS: ED Triage Vitals  Enc Vitals Group     BP 07/11/17 0622 (!) 159/88     Pulse Rate 07/11/17 0622 76     Resp 07/11/17 0622 20     Temp 07/11/17 0622 98 F (36.7 C)     Temp Source 07/11/17 0622 Oral     SpO2 07/11/17 0622 100 %     Weight 07/11/17 0620 135 lb (61.2 kg)     Height  07/11/17 0625 5\' 4"  (1.626 m)     Head Circumference --      Peak Flow --      Pain Score --      Pain Loc --      Pain Edu? --      Excl. in [place]? --      Constitutional: Alert and oriented. Well appearing and in no distress. HEENT   Head: Normocephalic and atraumatic.      Eyes: Conjunctivae are normal. Pupils equal and round.       Ears:         Nose: No congestion/rhinnorhea.   Mouth/Throat: Mucous membranes are moist.  Normal tongue and oropharynx to inspection.   Neck: No stridor. Cardiovascular/Chest: Normal rate, regular rhythm.  No murmurs, rubs, or gallops. Respiratory: Normal respiratory effort without tachypnea nor retractions. Breath sounds are clear and equal bilaterally. No wheezes/rales/rhonchi. Gastrointestinal: Soft. No distention, no guarding, no rebound. Nontender.    Genitourinary/rectal:Deferred Musculoskeletal: Nontender with normal range of motion in all extremities. No joint effusions.  No lower extremity tenderness.  No edema. Neurologic:  Normal speech and language. No gross or focal neurologic deficits are appreciated. Skin:  Skin is warm, dry and intact. No rash noted. Psychiatric: Mood and affect are normal. Speech and behavior are normal. Patient exhibits appropriate insight and judgment.   ____________________________________________  LABS (pertinent positives/negatives) I, Lisa Roca, MD the attending physician have reviewed the labs noted below.  Labs Reviewed  BASIC METABOLIC PANEL - Abnormal; Notable for the following:       Result Value   Calcium 8.8 (*)    All other components within normal limits  CBC  TROPONIN I  TROPONIN I    ____________________________________________    EKG I, Lisa Roca, MD, the attending physician have personally viewed and interpreted all ECGs.  79 bpm. Normal sinus rhythm. Occasional PVC. No distress. Normal axis. Normal ST and  T-wave ____________________________________________  RADIOLOGY All Xrays were viewed by me.  Imaging interpreted by Radiologist, and I, Lisa Roca, MD the attending physician have reviewed the radiologist interpretation noted below.  CXR port:  IMPRESSION: No active disease.  __________________________________________  PROCEDURES  Procedure(s) performed: None  Critical Care performed: None  ____________________________________________  No current facility-administered medications on file prior to encounter.    Current Outpatient Prescriptions on File Prior to Encounter  Medication Sig Dispense Refill  . Calcium Carbonate-Vitamin D (CALTRATE 600+D) 600-400 MG-UNIT tablet Take 1 tablet by mouth daily.    Marland Kitchen lisinopril (PRINIVIL,ZESTRIL) 10 MG tablet Take 10 mg by mouth daily.    . Multiple Vitamin (MULTI-VITAMINS) TABS Take by mouth.      ____________________________________________  ED COURSE / ASSESSMENT AND PLAN  Pertinent labs & imaging results that were available during my care of the patient were reviewed by me and considered in my medical decision making (  see chart for details).    Ms. Soutar described sensation of a ball at the top of her chest and the bottom of her throat for about 2 hours this morning, currently now gone. Symptoms sound potentially most like GI spasm. We went over and does not sound like this was likely angioedema or allergic reaction. She did not eat anything that she feels like would've scratched her throat. No associated symptoms to make me more concerned about ACS, however I did discuss with her based on her age and hypertension to go ahead and send a repeat troponin and I will probably refer her for cardiology follow-up as well as likely GI follow-up.  After I left the room I was called back in and the patient stated that when she took a deep breath she felt a scratch at the back of her throat fairly deep, but it was mild. She is having no  trouble breathing and no trouble swallowing. I am going to give her ibuprofen.  We discussed at length to certainly return for any worsening symptoms most specifically out of concern for whether or not this could be some sort of unusual location for angioedema given the fact that she is on lisinopril although when I have no evidence for that.  Patient's feeling well and okay for discharge after repeat negative troponin.  DIFFERENTIAL DIAGNOSIS: Differential diagnosis includes, but is not limited to, ACS, aortic dissection, pulmonary embolism, cardiac tamponade, pneumothorax, pneumonia, pericarditis/myocarditis, GI-related causes including esophagitis/gastritis, esophageal spasm, angioedema, posterior and deep throat/neck infections and musculoskeletal chest wall pain.    CONSULTATIONS:   None   Patient / Family / Caregiver informed of clinical course, medical decision-making process, and agree with plan.   I discussed return precautions, follow-up instructions, and discharge instructions with patient and/or family.  Discharge Instructions : You were evaluated for fullness at the throat/chest and although no certain cause was found, your exam and evaluation are overall reassuring in the ER today.  Return to the ER immediately for any new or worsening symptoms including chest pain, trouble breathing, shortness of breath, fever, trouble swallowing, tongue or face or throat swelling, dizziness, passing out, or any other symptoms concerning to you.  ___________________________________________   FINAL CLINICAL IMPRESSION(S) / ED DIAGNOSES   Final diagnoses:  Nonspecific chest pain              Note: This dictation was prepared with Dragon dictation. Any transcriptional errors that result from this process are unintentional    Lisa Roca, MD 07/13/17 [phone]

## 2017-07-11 NOTE — ED Triage Notes (Signed)
Pt c/o sudden onset of central chest pain, accompanied with SOB this AM. Pt too Asprin before coming to ED. Pt taken to room 10 for further evaluation.

## 2017-07-11 NOTE — Discharge Instructions (Signed)
You were evaluated for fullness at the throat/chest and although no certain cause was found, your exam and evaluation are overall reassuring in the ER today.  Return to the ER immediately for any new or worsening symptoms including chest pain, trouble breathing, shortness of breath, fever, trouble swallowing, tongue or face or throat swelling, dizziness, passing out, or any other symptoms concerning to you.

## 2017-07-11 NOTE — ED Notes (Signed)
Pt verbalized understanding of discharge instructions. NAD at this time. 

## 2017-07-11 NOTE — ED Notes (Addendum)
Pt states waking up to feeling a lump in the base of her throat that is constant achy pain. Hurts worse when taking a deep breath

## 2017-07-14 ENCOUNTER — Telehealth: Payer: Self-pay

## 2017-07-14 NOTE — Telephone Encounter (Signed)
Lmov for patient Patient was seen in ED on 07/11/17 for CP Will try again at a later time

## 2017-07-16 NOTE — Telephone Encounter (Signed)
Pt is scheduled with Dr End on 09/10/17 Pt has been added to wait list   Nothing further needed.

## 2017-09-02 ENCOUNTER — Encounter: Payer: Self-pay | Admitting: Internal Medicine

## 2017-09-02 ENCOUNTER — Ambulatory Visit: Payer: Medicare Other | Admitting: Internal Medicine

## 2017-09-02 VITALS — BP 138/80 | HR 72 | Ht 64.0 in | Wt 138.0 lb

## 2017-09-02 DIAGNOSIS — I10 Essential (primary) hypertension: Secondary | ICD-10-CM

## 2017-09-02 DIAGNOSIS — R413 Other amnesia: Secondary | ICD-10-CM | POA: Diagnosis not present

## 2017-09-02 DIAGNOSIS — R0789 Other chest pain: Secondary | ICD-10-CM | POA: Diagnosis not present

## 2017-09-02 MED ORDER — ASPIRIN EC 81 MG PO TBEC: 81.0000 mg | DELAYED_RELEASE_TABLET | Freq: Every day | ORAL | 3 refills | Status: DC

## 2017-09-02 NOTE — Progress Notes (Signed)
New Outpatient Visit Date: 09/02/2017  Primary Care Provider: Sofie Hartigan, MD [address] [place], [place] [address]  Chief Complaint: Chest pain  HPI:  [name] is a 82 y.o. female who is being seen today for the evaluation of chest pain. She has a history of hypertension and breast cancer. On the morning of 07/11/17, [name] awoke with severe pain in her upper chest and base of her throat.  She felt as though there was a lump in her throat and that her throat was closing off.  She did not have any shortness of breath, palpitations, or lightheadedness.  She does not believe that the pain radiated anywhere, though her husband indicates that the pain seems to extend throughout her chest.  [name] is unable to provide further details saying only that the pain was severe and lasted about 10 minutes.  This was the first and only time she ever experienced pain like this.  It has not recurred.  Given the severity of her pain, [name] presented to the emergency department for further evaluation.  Workup there was unrevealing with the exception of EKG demonstrating occasional PVCs.  Of note, [name] does not recall many specifics of the pain that brought her to the ER on 07/11/17.  On further questioning, she notes some forgetfulness.  Her husband also reports that [name]'s short-term memory has declined.  --------------------------------------------------------------------------------------------------  Cardiovascular History & Procedures: Cardiovascular Problems:  Atypical chest pain  Risk Factors:  Hypertension and age greater than 55  Cath/PCI:  None  CV Surgery:  None  EP Procedures and Devices:  None  Non-Invasive Evaluation(s):  None  Recent CV Pertinent Labs: Lab Results  Component Value Date   K 4.1 07/11/2017   K 3.8 05/06/2013   BUN 14 07/11/2017   BUN 9 05/06/2013   CREATININE 0.81 07/11/2017   CREATININE 0.77 11/25/2013     --------------------------------------------------------------------------------------------------  Past Medical History:  Diagnosis Date  . Breast cancer [facility]) 2009   Right breast CA with lumpectomy, radiation and chemo tx's.  Marland Kitchen Hypertension   . Personal history of chemotherapy   . Personal history of radiation therapy     Past Surgical History:  Procedure Laterality Date  . BREAST EXCISIONAL BIOPSY Right 2009  . COLONOSCOPY WITH PROPOFOL N/A 07/10/2015   Procedure: COLONOSCOPY WITH PROPOFOL;  Surgeon: Hulen Luster, MD;  Location: [facility] ENDOSCOPY;  Service: Gastroenterology;  Laterality: N/A;  . TONSILLECTOMY     age of 82 years old    Current Meds  Medication Sig  . Calcium Carbonate-Vitamin D (CALTRATE 600+D) 600-400 MG-UNIT tablet Take 1 tablet by mouth daily.  Marland Kitchen lisinopril (PRINIVIL,ZESTRIL) 10 MG tablet Take 10 mg by mouth daily.  . Multiple Vitamin (MULTI-VITAMINS) TABS Take by mouth.    Allergies: Flu virus vaccine; Isoflavones; Latex; Other; Procaine; and Penicillins  Social History   Socioeconomic History  . Marital status: Married    Spouse name: Not on file  . Number of children: Not on file  . Years of education: Not on file  . Highest education level: Not on file  Social Needs  . Financial resource strain: Not on file  . Food insecurity - worry: Not on file  . Food insecurity - inability: Not on file  . Transportation needs - medical: Not on file  . Transportation needs - non-medical: Not on file  Occupational History  . Not on file  Tobacco Use  . Smoking status: Never Smoker  .  Smokeless tobacco: Never Used  Substance and Sexual Activity  . Alcohol use: No  . Drug use: No  . Sexual activity: Not on file  Other Topics Concern  . Not on file  Social History Narrative  . Not on file    Family History  Problem Relation Age of Onset  . Cancer Brother        lung cancer  . Cancer Sister        not sure  . Heart disease Father   .  Hypertension Father   . Breast cancer Neg Hx     Review of Systems: A 12-system review of systems was performed and was negative except as noted in the HPI.  --------------------------------------------------------------------------------------------------  Physical Exam: BP 138/80 (BP Location: Left Arm, Patient Position: Sitting, Cuff Size: Normal)   Pulse 72   Ht 5\' 4"  (1.626 m)   Wt 138 lb (62.6 kg)   BMI 23.69 kg/m   General: Slender, elderly woman, seated comfortably in the exam room.  She is accompanied by her husband. HEENT: No conjunctival pallor or scleral icterus. Moist mucous membranes. OP clear. Neck: Supple without lymphadenopathy, thyromegaly, JVD, or HJR. No carotid bruit. Lungs: Normal work of breathing. Clear to auscultation bilaterally without wheezes or crackles. Heart: Regular rate and rhythm without murmurs, rubs, or gallops. Non-displaced PMI. Abd: Bowel sounds present. Soft, NT/ND without hepatosplenomegaly Ext: No lower extremity edema. Radial, PT, and DP pulses are 2+ bilaterally Skin: Warm and dry without rash. Neuro: CNIII-XII intact. Strength and fine-touch sensation intact in upper and lower extremities bilaterally. Psych: Normal mood and affect.  EKG: Normal sinus rhythm with poor R wave progression in V1 and V2, which could represent septal infarct versus lead placement.  Compared with prior tracing from 07/11/17, poor R wave progression is new.  PVCs are no longer present.  Lab Results  Component Value Date   WBC 9.1 07/11/2017   HGB 13.9 07/11/2017   HCT 40.5 07/11/2017   MCV 97.1 07/11/2017   PLT 249 07/11/2017    Lab Results  Component Value Date   NA 136 07/11/2017   K 4.1 07/11/2017   CL 103 07/11/2017   CO2 26 07/11/2017   BUN 14 07/11/2017   CREATININE 0.81 07/11/2017   GLUCOSE 99 07/11/2017   ALT 14 04/23/2017    No results found for: CHOL, HDL, LDLCALC, LDLDIRECT, TRIG,  CHOLHDL   --------------------------------------------------------------------------------------------------  ASSESSMENT AND PLAN: Atypical chest pain [name] reports a single episode of severe chest pain that awoke her from sleep on the morning of 07/11/17.  Unfortunately, she is only able to provide limited details about the event.  Emergency department workup was unrevealing with the exception of her EKG demonstrating occasional PVCs.  Cardiac risk factors include hypertension and age.  EKG today demonstrates possible septal infarct, though this also could reflect lead placement.  We have discussed risks and benefits of noninvasive ischemia testing and have agreed to perform an exercise myocardial perfusion stress test.  If this is a low risk, no further cardiac testing is needed.  Pending the stress test, I have recommended that Ms. Hur take aspirin 81 mg daily.  If she has recurrence of pain in the setting of a low risk stress test, it may be worthwhile to consider empiric treatment for acid reflux, as the nature of her symptoms suggest a GI etiology.  Hypertension Blood pressure is borderline elevated today.  Ms. Dargan should continue with lisinopril and follow-up with her PCP for  ongoing management.  Short-term memory loss Patient found it difficult to provide details regarding the chest pain that led to her ED visit last month.  She and her husband note some decline in short-term memory function.  I will defer further workup to Ms. Harpenau's PCP.  Follow-up: To be determined based on results of stress test.  If the study is normal, she can return to see [place] as needed.  Nelva Bush, MD 09/03/2017 9:36 AM

## 2017-09-02 NOTE — Patient Instructions (Addendum)
Medication Instructions:  Your physician has recommended you make the following change in your medication:  1- START Aspirin 81 mg by mouth once a day.   Labwork: none  Testing/Procedures:  [facility] TREADMILL MYOVIEW  Your caregiver has ordered a Stress Test with nuclear imaging. The purpose of this test is to evaluate the blood supply to your heart muscle. This procedure is referred to as a "Non-Invasive Stress Test." This is because other than having an IV started in your vein, nothing is inserted or "invades" your body. Cardiac stress tests are done to find areas of poor blood flow to the heart by determining the extent of coronary artery disease (CAD). Some patients exercise on a treadmill, which naturally increases the blood flow to your heart, while others who are  unable to walk on a treadmill due to physical limitations have a pharmacologic/chemical stress agent called Lexiscan . This medicine will mimic walking on a treadmill by temporarily increasing your coronary blood flow.   Please note: these test may take anywhere between 2-4 hours to complete  PLEASE REPORT TO [facility] AT THE FIRST DESK WILL DIRECT YOU WHERE TO GO  Date of Procedure:____12/11/18___________  Arrival Time for Procedure:______08:45 am____________  Instructions regarding medication:   You may take your usual morning medications the morning of the procedure.   PLEASE NOTIFY THE OFFICE AT LEAST 64 HOURS IN ADVANCE IF YOU ARE UNABLE TO KEEP YOUR APPOINTMENT.  [phone] AND  PLEASE NOTIFY NUCLEAR MEDICINE AT [facility] AT LEAST 24 HOURS IN ADVANCE IF YOU ARE UNABLE TO KEEP YOUR APPOINTMENT. [phone]  How to prepare for your Myoview test:  1. Do not eat or drink after midnight 2. No caffeine for 24 hours prior to test 3. No smoking 24 hours prior to test. 4. Your medication may be taken with water.  If your doctor stopped a medication because of this test, do not take that  medication. 5. Ladies, please do not wear dresses.  Skirts or pants are appropriate. Please wear a short sleeve shirt. 6. No perfume, cologne or lotion. 7. Wear comfortable walking shoes. No heels!         Follow-Up: Your physician recommends that you schedule a follow-up appointment in: as needed.   If you need a refill on your cardiac medications before your next appointment, please call your pharmacy.   Cardiac Nuclear Scan A cardiac nuclear scan is a test that measures blood flow to the heart when a person is resting and when he or she is exercising. The test looks for problems such as:  Not enough blood reaching a portion of the heart.  The heart muscle not working normally.  You may need this test if:  You have heart disease.  You have had abnormal lab results.  You have had heart surgery or angioplasty.  You have chest pain.  You have shortness of breath.  In this test, a radioactive dye (tracer) is injected into your bloodstream. After the tracer has traveled to your heart, an imaging device is used to measure how much of the tracer is absorbed by or distributed to various areas of your heart. This procedure is usually done at a hospital and takes 2-4 hours. Tell a health care provider about:  Any allergies you have.  All medicines you are taking, including vitamins, herbs, eye drops, creams, and over-the-counter medicines.  Any problems you or family members have had with the use of anesthetic medicines.  Any blood  disorders you have.  Any surgeries you have had.  Any medical conditions you have.  Whether you are pregnant or may be pregnant. What are the risks? Generally, this is a safe procedure. However, problems may occur, including:  Serious chest pain and heart attack. This is only a risk if the stress portion of the test is done.  Rapid heartbeat.  Sensation of warmth in your chest. This usually passes quickly.  What happens before the  procedure?  Ask your health care provider about changing or stopping your regular medicines. This is especially important if you are taking diabetes medicines or blood thinners.  Remove your jewelry on the day of the procedure. What happens during the procedure?  An IV tube will be inserted into one of your veins.  Your health care provider will inject a small amount of radioactive tracer through the tube.  You will wait for 20-40 minutes while the tracer travels through your bloodstream.  Your heart activity will be monitored with an electrocardiogram (ECG).  You will lie down on an exam table.  Images of your heart will be taken for about 15-20 minutes.  You may be asked to exercise on a treadmill or stationary bike. While you exercise, your heart's activity will be monitored with an ECG, and your blood pressure will be checked. If you are unable to exercise, you may be given a medicine to increase blood flow to parts of your heart.  When blood flow to your heart has peaked, a tracer will again be injected through the IV tube.  After 20-40 minutes, you will get back on the exam table and have more images taken of your heart.  When the procedure is over, your IV tube will be removed. The procedure may vary among health care providers and hospitals. Depending on the type of tracer used, scans may need to be repeated 3-4 hours later. What happens after the procedure?  Unless your health care provider tells you otherwise, you may return to your normal schedule, including diet, activities, and medicines.  Unless your health care provider tells you otherwise, you may increase your fluid intake. This will help flush the contrast dye from your body. Drink enough fluid to keep your urine clear or pale yellow.  It is up to you to get your test results. Ask your health care provider, or the department that is doing the test, when your results will be ready. Summary  A cardiac nuclear scan  measures the blood flow to the heart when a person is resting and when he or she is exercising.  You may need this test if you are at risk for heart disease.  Tell your health care provider if you are pregnant.  Unless your health care provider tells you otherwise, increase your fluid intake. This will help flush the contrast dye from your body. Drink enough fluid to keep your urine clear or pale yellow. This information is not intended to replace advice given to you by your health care provider. Make sure you discuss any questions you have with your health care provider. Document Released: 10/18/2004 Document Revised: 09/25/2016 Document Reviewed: 09/01/2013 Elsevier Interactive Patient Education  2017 [employer].

## 2017-09-03 ENCOUNTER — Encounter: Payer: Self-pay | Admitting: Internal Medicine

## 2017-09-03 DIAGNOSIS — I10 Essential (primary) hypertension: Secondary | ICD-10-CM | POA: Insufficient documentation

## 2017-09-03 DIAGNOSIS — R0789 Other chest pain: Secondary | ICD-10-CM | POA: Insufficient documentation

## 2017-09-03 DIAGNOSIS — R413 Other amnesia: Secondary | ICD-10-CM | POA: Insufficient documentation

## 2017-09-10 ENCOUNTER — Ambulatory Visit: Payer: Medicare Other | Admitting: Internal Medicine

## 2017-09-16 ENCOUNTER — Encounter
Admission: RE | Admit: 2017-09-16 | Discharge: 2017-09-16 | Disposition: A | Discharge status: Home / Self Care | Payer: Medicare Other | Source: Ambulatory Visit | Attending: Internal Medicine | Admitting: Internal Medicine

## 2017-09-16 DIAGNOSIS — R0789 Other chest pain: Secondary | ICD-10-CM

## 2017-09-16 LAB — NM MYOCAR MULTI W/SPECT W/WALL MOTION / EF
CHL CUP RESTING HR STRESS: 67 {beats}/min
CSEPED: 4 min
CSEPEW: 4.6 METS
CSEPPHR: 131 {beats}/min
Exercise duration (sec): 10 s
LV dias vol: 35 mL (ref 46–106)
LV sys vol: 10 mL
MPHR: 140 {beats}/min
Percent HR: 93 %
TID: 1

## 2017-09-16 MED ORDER — TECHNETIUM TC 99M TETROFOSMIN IV KIT
29.5690 | PACK | Freq: Once | INTRAVENOUS | Status: AC | PRN
  Administered 2017-09-16: 29.569 via INTRAVENOUS

## 2017-09-16 MED ORDER — TECHNETIUM TC 99M TETROFOSMIN IV KIT
12.8500 | PACK | Freq: Once | INTRAVENOUS | Status: AC | PRN
  Administered 2017-09-16: 12.85 via INTRAVENOUS

## 2017-09-27 ENCOUNTER — Observation Stay
Admission: EM | Admit: 2017-09-27 | Discharge: 2017-09-28 | Disposition: A | Discharge status: Home / Self Care | Payer: Medicare Other | Attending: Internal Medicine | Admitting: Internal Medicine

## 2017-09-27 ENCOUNTER — Encounter: Payer: Self-pay | Admitting: Emergency Medicine

## 2017-09-27 ENCOUNTER — Other Ambulatory Visit: Payer: Self-pay

## 2017-09-27 DIAGNOSIS — Z79899 Other long term (current) drug therapy: Secondary | ICD-10-CM | POA: Diagnosis not present

## 2017-09-27 DIAGNOSIS — I10 Essential (primary) hypertension: Secondary | ICD-10-CM | POA: Insufficient documentation

## 2017-09-27 DIAGNOSIS — Z9221 Personal history of antineoplastic chemotherapy: Secondary | ICD-10-CM | POA: Insufficient documentation

## 2017-09-27 DIAGNOSIS — R55 Syncope and collapse: Secondary | ICD-10-CM | POA: Diagnosis present

## 2017-09-27 DIAGNOSIS — Z853 Personal history of malignant neoplasm of breast: Secondary | ICD-10-CM | POA: Insufficient documentation

## 2017-09-27 DIAGNOSIS — Z923 Personal history of irradiation: Secondary | ICD-10-CM | POA: Insufficient documentation

## 2017-09-27 DIAGNOSIS — Z7982 Long term (current) use of aspirin: Secondary | ICD-10-CM | POA: Insufficient documentation

## 2017-09-27 DIAGNOSIS — I959 Hypotension, unspecified: Secondary | ICD-10-CM | POA: Diagnosis not present

## 2017-09-27 DIAGNOSIS — R9431 Abnormal electrocardiogram [ECG] [EKG]: Secondary | ICD-10-CM

## 2017-09-27 LAB — BASIC METABOLIC PANEL
ANION GAP: 8 (ref 5–15)
BUN: 14 mg/dL (ref 6–20)
CHLORIDE: 104 mmol/L (ref 101–111)
CO2: 27 mmol/L (ref 22–32)
Calcium: 9 mg/dL (ref 8.9–10.3)
Creatinine, Ser: 0.76 mg/dL (ref 0.44–1.00)
GFR calc Af Amer: >60 mL/min (ref >60)
Glucose, Bld: 111 mg/dL — ABNORMAL HIGH (ref 65–99)
POTASSIUM: 4.1 mmol/L (ref 3.5–5.1)
SODIUM: 139 mmol/L (ref 135–145)

## 2017-09-27 LAB — URINALYSIS, COMPLETE (UACMP) WITH MICROSCOPIC
BILIRUBIN URINE: NEGATIVE
Glucose, UA: NEGATIVE mg/dL
Hgb urine dipstick: NEGATIVE
KETONES UR: NEGATIVE mg/dL
LEUKOCYTES UA: NEGATIVE
NITRITE: NEGATIVE
PROTEIN: NEGATIVE mg/dL
Specific Gravity, Urine: 1.004 — ABNORMAL LOW (ref 1.005–1.030)
pH: 7 (ref 5.0–8.0)

## 2017-09-27 LAB — CBC
HEMATOCRIT: 41.7 % (ref 35.0–47.0)
HEMOGLOBIN: 13.7 g/dL (ref 12.0–16.0)
MCH: 32.4 pg (ref 26.0–34.0)
MCHC: 32.7 g/dL (ref 32.0–36.0)
MCV: 98.8 fL (ref 80.0–100.0)
Platelets: 267 10*3/uL (ref 150–440)
RBC: 4.22 MIL/uL (ref 3.80–5.20)
RDW: 13.2 % (ref 11.5–14.5)
WBC: 7.4 10*3/uL (ref 3.6–11.0)

## 2017-09-27 LAB — HEPATIC FUNCTION PANEL
ALBUMIN: 4.1 g/dL (ref 3.5–5.0)
ALT: 13 U/L — ABNORMAL LOW (ref 14–54)
AST: 21 U/L (ref 15–41)
Alkaline Phosphatase: 81 U/L (ref 38–126)
BILIRUBIN TOTAL: 0.3 mg/dL (ref 0.3–1.2)
Bilirubin, Direct: <0.1 mg/dL — ABNORMAL LOW (ref 0.1–0.5)
Total Protein: 7.3 g/dL (ref 6.5–8.1)

## 2017-09-27 LAB — TROPONIN I: Troponin I: <0.03 ng/mL (ref <0.03)

## 2017-09-27 MED ORDER — ACETAMINOPHEN 650 MG RE SUPP: 650.0000 mg | Freq: Four times a day (QID) | RECTAL | Status: DC | PRN

## 2017-09-27 MED ORDER — SODIUM CHLORIDE 0.9 % IV BOLUS (SEPSIS)
1000.0000 mL | Freq: Once | INTRAVENOUS | Status: AC
  Administered 2017-09-27: 1000 mL via INTRAVENOUS

## 2017-09-27 MED ORDER — SODIUM CHLORIDE 0.9% FLUSH
3.0000 mL | Freq: Two times a day (BID) | INTRAVENOUS | Status: DC
  Administered 2017-09-27: 3 mL via INTRAVENOUS

## 2017-09-27 MED ORDER — ENOXAPARIN SODIUM 40 MG/0.4ML ~~LOC~~ SOLN
40.0000 mg | SUBCUTANEOUS | Status: DC
  Administered 2017-09-27: 40 mg via SUBCUTANEOUS
  Filled 2017-09-27: qty 0.4

## 2017-09-27 MED ORDER — ONDANSETRON HCL 4 MG PO TABS: 4.0000 mg | ORAL_TABLET | Freq: Four times a day (QID) | ORAL | Status: DC | PRN

## 2017-09-27 MED ORDER — SODIUM CHLORIDE 0.9 % IV SOLN
INTRAVENOUS | Status: DC
  Administered 2017-09-27: 20:00:00 via INTRAVENOUS

## 2017-09-27 MED ORDER — ONDANSETRON HCL 4 MG/2ML IJ SOLN: 4.0000 mg | Freq: Four times a day (QID) | INTRAMUSCULAR | Status: DC | PRN

## 2017-09-27 MED ORDER — ACETAMINOPHEN 325 MG PO TABS: 650.0000 mg | ORAL_TABLET | Freq: Four times a day (QID) | ORAL | Status: DC | PRN

## 2017-09-27 MED ORDER — ASPIRIN EC 81 MG PO TBEC
81.0000 mg | DELAYED_RELEASE_TABLET | Freq: Every day | ORAL | Status: DC
  Administered 2017-09-27 – 2017-09-28 (×2): 81 mg via ORAL
  Filled 2017-09-27 (×2): qty 1

## 2017-09-27 MED ORDER — LISINOPRIL 10 MG PO TABS
10.0000 mg | ORAL_TABLET | Freq: Every day | ORAL | Status: DC
  Administered 2017-09-27: 10 mg via ORAL
  Filled 2017-09-27: qty 1

## 2017-09-27 MED ORDER — POLYETHYLENE GLYCOL 3350 17 G PO PACK: 17.0000 g | PACK | Freq: Every day | ORAL | Status: DC | PRN

## 2017-09-27 NOTE — ED Notes (Signed)
Called report to Lurlean Horns  RN

## 2017-09-27 NOTE — ED Notes (Signed)
Removed patient blood pressure cuff off right arm and placed on left arm due breast removal .

## 2017-09-27 NOTE — ED Notes (Signed)
Patient discussed with Dr. Cinda Quest who gave verbal order for 1 liter normal saline. Per Dr. Cinda Quest pt needs to be brought back to room. RN tried to call charge RN, unable to reach at this time.

## 2017-09-27 NOTE — ED Provider Notes (Signed)
[facility] Emergency Department Provider Note   ____________________________________________   First MD Initiated Contact with Patient 09/27/17 1526     (approximate)  I have reviewed the triage vital signs and the nursing notes.   HISTORY  Chief Complaint Loss of Consciousness    HPI Cynthia Walker is a 82 y.o. female who reports she passed out on the toilet  Patient and her husband reports that she is having some abdominal pain briefly this morning, she felt like she had a bowel bowel movement, while straining to have a bowel movement she passed out.  She did not fall off but her husband supported her, and he estimates that she was unconscious for about 5 minutes.  He then moved her to the bed and she became to come to.  She reports now she feels much better, no ongoing pain.  No black or bloody stools no nausea or vomiting.  Did have some chest pain about a month ago but tests were unrevealing as to cause and she is followed up with cardiology  Denies any ongoing discomfort or pain.  Reports she occasionally gets pain in the left lower abdomen sometimes from different foods or think she will eat, seem to be occurring today and need to use the bathroom.  Past Medical History:  Diagnosis Date  . Breast cancer [facility]) 2009   Right breast CA with lumpectomy, radiation and chemo tx's.  Marland Kitchen Hypertension   . Personal history of chemotherapy   . Personal history of radiation therapy     Patient Active Problem List   Diagnosis Date Noted  . Atypical chest pain 09/03/2017  . Essential hypertension 09/03/2017  . Short-term memory loss 09/03/2017  . Osteoporosis 04/02/2016  . Breast cancer, right ([place]) 04/06/2008    Past Surgical History:  Procedure Laterality Date  . BREAST EXCISIONAL BIOPSY Right 2009  . COLONOSCOPY WITH PROPOFOL N/A 07/10/2015   Procedure: COLONOSCOPY WITH PROPOFOL;  Surgeon: Hulen Luster, MD;  Location: [facility] ENDOSCOPY;  Service:  Gastroenterology;  Laterality: N/A;  . TONSILLECTOMY     age of 82 years old    Prior to Admission medications   Medication Sig Start Date End Date Taking? Authorizing Provider  aspirin EC 81 MG tablet Take 1 tablet (81 mg total) by mouth daily. 09/02/17   End, Harrell Gave, MD  Calcium Carbonate-Vitamin D (CALTRATE 600+D) 600-400 MG-UNIT tablet Take 1 tablet by mouth daily.    [provider]  lisinopril (PRINIVIL,ZESTRIL) 10 MG tablet Take 10 mg by mouth daily.    [provider]  Multiple Vitamin (MULTI-VITAMINS) TABS Take by mouth.    [provider]    Allergies Flu virus vaccine; Isoflavones; Latex; Other; Procaine; and Penicillins  Family History  Problem Relation Age of Onset  . Cancer Brother        lung cancer  . Cancer Sister        not sure  . Heart disease Father   . Hypertension Father   . Breast cancer Neg Hx     Social History Social History   Tobacco Use  . Smoking status: Never Smoker  . Smokeless tobacco: Never Used  Substance Use Topics  . Alcohol use: No  . Drug use: No    Review of Systems Constitutional: No fever/chills Eyes: No visual changes. ENT: No sore throat. Cardiovascular: Denies chest pain. Respiratory: Denies shortness of breath. Gastrointestinal: No nausea, no vomiting.  No diarrhea.  No constipation. Genitourinary: Negative for dysuria.  Musculoskeletal: Negative for back pain. Skin: Negative for rash. Neurological: Negative for headaches, focal weakness or numbness.    ____________________________________________   PHYSICAL EXAM:  VITAL SIGNS: ED Triage Vitals  Enc Vitals Group     BP 09/27/17 1351 (!) 83/60     Pulse Rate 09/27/17 1351 70     Resp 09/27/17 1351 16     Temp 09/27/17 1351 97.9 F (36.6 C)     Temp Source 09/27/17 1351 Oral     SpO2 09/27/17 1351 99 %     Weight 09/27/17 1355 138 lb (62.6 kg)     Height 09/27/17 1449 5\' 4"  (1.626 m)     Head Circumference --      Peak Flow  --      Pain Score --      Pain Loc --      Pain Edu? --      Excl. in [place]? --     Constitutional: Alert and oriented. Well appearing and in no acute distress. Eyes: Conjunctivae are normal. Head: Atraumatic. Nose: No congestion/rhinnorhea. Mouth/Throat: Mucous membranes are moist. Neck: No stridor.   Cardiovascular: Normal rate, regular rhythm. Grossly normal heart sounds.  Good peripheral circulation. Respiratory: Normal respiratory effort.  No retractions. Lungs CTAB. Gastrointestinal: Soft and nontender. No distention.  No discomfort in any quadrant.  No rebound or guarding.  No distention. Musculoskeletal: No lower extremity tenderness nor edema. Neurologic:  Normal speech and language. No gross focal neurologic deficits are appreciated.  Skin:  Skin is warm, dry and intact. No rash noted. Psychiatric: Mood and affect are normal. Speech and behavior are normal.  ____________________________________________   LABS (all labs ordered are listed, but only abnormal results are displayed)  Labs Reviewed  BASIC METABOLIC PANEL - Abnormal; Notable for the following components:      Result Value   Glucose, Bld 111 (*)    All other components within normal limits  URINALYSIS, COMPLETE (UACMP) WITH MICROSCOPIC - Abnormal; Notable for the following components:   Color, Urine STRAW (*)    APPearance CLEAR (*)    Specific Gravity, Urine 1.004 (*)    Bacteria, UA RARE (*)    Squamous Epithelial / LPF 0-5 (*)    All other components within normal limits  CBC  HEPATIC FUNCTION PANEL  CBG MONITORING, ED   ____________________________________________  EKG  Reviewed and turned by me at 1400 Heart rate 70 QRS 55 QTC 420 Normal sinus rhythm, minimal T wave abnormality seen in V2, this is slightly new as compared to previous EKG from July 11, 2017, but the patient is also notably chest pain-free.  no ST elevation ____________________________________________  RADIOLOGY  No  results found.  No indications noted for imaging at this time.  Neurologic exam normal.  No chest or respiratory symptoms ____________________________________________   PROCEDURES  Procedure(s) performed: None  Procedures  Critical Care performed: No  ____________________________________________   INITIAL IMPRESSION / ASSESSMENT AND PLAN / ED COURSE  Pertinent labs & imaging results that were available during my care of the patient were reviewed by me and considered in my medical decision making (see chart for details).  Patient presents initially hypotensive with EMS, he had an episode of syncope.  Now awake and alert fully oriented after receiving a liter of normal saline she is normotensive and asymptomatic.  She did have some brief abdominal pain which she reports getting off and on, and she was to have a bowel movement when the syncope occurred.  She is never passed out before.  EKG some minimal T wave abnormality from previous, but may be associated with lead positioning and she does not complain of any ischemic symptoms.  Normal and reassuring neurologic exam.  Awake alert well oriented with no concerns at this time.  She does however fail the [facility] syncope rule, she did present initially with systolic blood pressure less than 90.  Discussed with the patient, will admit her to the hospital for further evaluation and observation.  We did discuss obtaining CT imaging of the abdomen and pelvis, but given her asymptomatic status reassured medical decision making we will forego this and consider obtaining imaging if she is to have recurrence of her abdominal pain but present time very reassuring.  She and her husband felt very pleasant, understanding and agreeable with plan      ____________________________________________   FINAL CLINICAL IMPRESSION(S) / ED DIAGNOSES  Final diagnoses:  Syncope, unspecified syncope type  Transient hypotension  Electrocardiogram showing T  wave abnormalities      NEW MEDICATIONS STARTED DURING THIS VISIT:  This SmartLink is deprecated. Use AVSMEDLIST instead to display the medication list for a patient.   Note:  This document was prepared using Dragon voice recognition software and may include unintentional dictation errors.     Delman Kitten, MD 09/27/17 [phone]

## 2017-09-27 NOTE — Progress Notes (Signed)
Pt. Has c/o gas pains and would like a sleep aide for insomnia. [facility] paged awaiting call back.

## 2017-09-27 NOTE — ED Triage Notes (Signed)
Pt to ED via [facility] from home syncopal episode. Pt states that she was having a bowel movement and she passed out on the toilet. Pt husband states that pt was unconscious for approximately 5-10 minutes. Pt hypotensive in triage, otherwise in NAD.

## 2017-09-27 NOTE — ED Notes (Signed)
Patient transported to 40

## 2017-09-27 NOTE — H&P (Signed)
Cynthia Walker at [place] NAME: Cynthia Walker    MR#:  [id number]  DATE OF BIRTH:  [birth year]  DATE OF ADMISSION:  09/27/2017  PRIMARY CARE PHYSICIAN: Sofie Hartigan, MD   REQUESTING/REFERRING PHYSICIAN: Quale  CHIEF COMPLAINT:   Loss of consciousness HISTORY OF PRESENT ILLNESS:  Cynthia Walker  is a 82 y.o. female with a known history of breast cancer status post lumpectomy, radiation and chemotherapies, essential hypertension is brought into the ED after she had a passing out spell. Patient has left lower quadrant abdominal pain while having a bowel movement and she was on commode and she passed out. Did not fall. According to the husband the episode lasted for 4-5 minutes. Patient denies any chest pain or dizziness. Denies any constipation, nausea vomiting or black tarry stool. Patient recently was followed by cardiology for chest pain and all tests were normal. Blood pressure in the emergency department was at 83 / 60 but after giving fluid bolus it's back to normal. During my examination patient is resting comfortably  PAST MEDICAL HISTORY:   Past Medical History:  Diagnosis Date  . Breast cancer [facility]) 2009   Right breast CA with lumpectomy, radiation and chemo tx's.  Marland Kitchen Hypertension   . Personal history of chemotherapy   . Personal history of radiation therapy     PAST SURGICAL HISTOIRY:   Past Surgical History:  Procedure Laterality Date  . BREAST EXCISIONAL BIOPSY Right 2009  . COLONOSCOPY WITH PROPOFOL N/A 07/10/2015   Procedure: COLONOSCOPY WITH PROPOFOL;  Surgeon: Hulen Luster, MD;  Location: [facility] ENDOSCOPY;  Service: Gastroenterology;  Laterality: N/A;  . TONSILLECTOMY     age of 82 years old    SOCIAL HISTORY:   Social History   Tobacco Use  . Smoking status: Never Smoker  . Smokeless tobacco: Never Used  Substance Use Topics  . Alcohol use: No    FAMILY HISTORY:   Family History  Problem Relation Age  of Onset  . Cancer Brother        lung cancer  . Cancer Sister        not sure  . Heart disease Father   . Hypertension Father   . Breast cancer Neg Hx     DRUG ALLERGIES:   Allergies  Allergen Reactions  . Flu Virus Vaccine     Other reaction(s): Other (See Comments) Stomach cramps  . Isoflavones     Other reaction(s): Other (See Comments) Soy causes legs to feel like rubber  . Latex Swelling  . Other     Other reaction(s): Unknown Allergy to eggs  . Procaine Other (See Comments)    weakness  . Penicillins Rash    Has patient had a PCN reaction causing immediate rash, facial/tongue/throat swelling, SOB or lightheadedness with hypotension: No Has patient had a PCN reaction causing severe rash involving mucus membranes or skin necrosis: No Has patient had a PCN reaction that required hospitalization: No Has patient had a PCN reaction occurring within the last 10 years: No If all of the above answers are "NO", then may proceed with Cephalosporin use.     REVIEW OF SYSTEMS:  CONSTITUTIONAL: No fever, fatigue or weakness.  EYES: No blurred or double vision.  EARS, NOSE, AND THROAT: No tinnitus or ear pain.  RESPIRATORY: No cough, shortness of breath, wheezing or hemoptysis.  CARDIOVASCULAR: No chest pain, orthopnea, edema.  GASTROINTESTINAL: No nausea, vomiting, diarrhea or abdominal pain.  GENITOURINARY: No  dysuria, hematuria.  ENDOCRINE: No polyuria, nocturia,  HEMATOLOGY: No anemia, easy bruising or bleeding SKIN: No rash or lesion. MUSCULOSKELETAL: No joint pain or arthritis.   NEUROLOGIC: No tingling, numbness, weakness.  PSYCHIATRY: No anxiety or depression.   MEDICATIONS AT HOME:   Prior to Admission medications   Medication Sig Start Date End Date Taking? Authorizing Provider  lisinopril (PRINIVIL,ZESTRIL) 10 MG tablet Take 10 mg by mouth daily.   Yes [provider]  Multiple Vitamin (MULTI-VITAMINS) TABS Take by mouth.   Yes [provider]  aspirin EC 81 MG tablet Take 1 tablet (81 mg total) by mouth daily. Patient not taking: Reported on 09/27/2017 09/02/17   End, Harrell Gave, MD      VITAL SIGNS:  Blood pressure (!) 113/57, pulse 67, temperature 97.9 F (36.6 C), temperature source Oral, resp. rate 19, height 5\' 4"  (1.626 m), weight 62.8 kg (138 lb 6.4 oz), SpO2 99 %.  PHYSICAL EXAMINATION:  GENERAL:  82 y.o.-year-old patient lying in the bed with no acute distress.  EYES: Pupils equal, round, reactive to light and accommodation. No scleral icterus. Extraocular muscles intact.  HEENT: Head atraumatic, normocephalic. Oropharynx and nasopharynx clear.  NECK:  Supple, no jugular venous distention. No thyroid enlargement, no tenderness.  LUNGS: Normal breath sounds bilaterally, no wheezing, rales,rhonchi or crepitation. No use of accessory muscles of respiration.  CARDIOVASCULAR: S1, S2 normal. No murmurs, rubs, or gallops.  ABDOMEN: Soft, nontender, nondistended. Bowel sounds present. No organomegaly or mass.  EXTREMITIES: No pedal edema, cyanosis, or clubbing.  NEUROLOGIC: Cranial nerves II through XII are intact. Muscle strength 5/5 in all extremities. Sensation intact. Gait not checked.  PSYCHIATRIC: The patient is alert and oriented x 3.  SKIN: No obvious rash, lesion, or ulcer.   LABORATORY PANEL:   CBC Recent Labs  Lab 09/27/17 1354  WBC 7.4  HGB 13.7  HCT 41.7  PLT 267   ------------------------------------------------------------------------------------------------------------------  Chemistries  Recent Labs  Lab 09/27/17 1354  NA 139  K 4.1  CL 104  CO2 27  GLUCOSE 111*  BUN 14  CREATININE 0.76  CALCIUM 9.0  AST 21  ALT 13*  ALKPHOS 81  BILITOT 0.3   ------------------------------------------------------------------------------------------------------------------  Cardiac Enzymes No results for input(s): TROPONINI in the last 168  hours. ------------------------------------------------------------------------------------------------------------------  RADIOLOGY:  No results found.  EKG:   Orders placed or performed during the hospital encounter of 09/27/17  . ED EKG  . ED EKG  . EKG 12-Lead  . EKG 12-Lead    IMPRESSION AND PLAN:   Cynthia Walker  is a 82 y.o. female with a known history of breast cancer status post lumpectomy, radiation and chemotherapies, essential hypertension is brought into the ED after she had a passing out spell. Patient has left lower quadrant abdominal pain while having a bowel movement and she was on commode and she passed out. Did not fall. According to the husband the episode lasted for 4-5 minutes. Patient denies any chest pain or dizziness. Denies any constipation, nausea vomiting or black tarry stool  #Syncope seems to be vasovagal Observe on telemetry Hydrate with IV fluids Check orthostatics Echocardiogram Cycle cardiac biomarkers PT evaluation Consider discharging the patient if stable in a.m.   # hypertension Continue home medication lisinopril  #History of breast cancer status post chemotherapy and radiation therapy Follow-up with oncology for surveillance as recommended   GI and DVT prophylaxis      All the records are reviewed and case discussed with ED provider. Management  plans discussed with the patient, family and they are in agreement.  CODE STATUS:  fc , husband is healthcare power of attorney  TOTAL TIME TAKING CARE OF THIS PATIENT: 42 minutes.   Note: This dictation was prepared with Dragon dictation along with smaller phrase technology. Any transcriptional errors that result from this process are unintentional.  Cynthia Walker M.D on 09/27/2017 at 5:53 PM  Between 7am to 6pm - Pager - [phone]  After 6pm go to [email or web address] - password [email or web address] [facility]  [facility]  Office  [phone]  CC: Primary care physician;  Sofie Hartigan, MD

## 2017-09-28 ENCOUNTER — Observation Stay
Admit: 2017-09-28 | Discharge: 2017-09-28 | Disposition: A | Discharge status: Home / Self Care | Payer: Medicare Other | Attending: Internal Medicine | Admitting: Internal Medicine

## 2017-09-28 LAB — CBC
HEMATOCRIT: 37.3 % (ref 35.0–47.0)
HEMOGLOBIN: 12.6 g/dL (ref 12.0–16.0)
MCH: 32.8 pg (ref 26.0–34.0)
MCHC: 33.8 g/dL (ref 32.0–36.0)
MCV: 97.2 fL (ref 80.0–100.0)
Platelets: 220 10*3/uL (ref 150–440)
RBC: 3.84 MIL/uL (ref 3.80–5.20)
RDW: 12.9 % (ref 11.5–14.5)
WBC: 6.7 10*3/uL (ref 3.6–11.0)

## 2017-09-28 LAB — BASIC METABOLIC PANEL
Anion gap: 5 (ref 5–15)
BUN: 10 mg/dL (ref 6–20)
CO2: 25 mmol/L (ref 22–32)
CREATININE: 0.58 mg/dL (ref 0.44–1.00)
Calcium: 8.5 mg/dL — ABNORMAL LOW (ref 8.9–10.3)
Chloride: 109 mmol/L (ref 101–111)
GFR calc Af Amer: >60 mL/min (ref >60)
Glucose, Bld: 95 mg/dL (ref 65–99)
Potassium: 3.8 mmol/L (ref 3.5–5.1)
SODIUM: 139 mmol/L (ref 135–145)

## 2017-09-28 LAB — GLUCOSE, CAPILLARY: GLUCOSE-CAPILLARY: 86 mg/dL (ref 65–99)

## 2017-09-28 LAB — TROPONIN I

## 2017-09-28 LAB — TSH: TSH: 4.882 u[IU]/mL — AB (ref 0.350–4.500)

## 2017-09-28 MED ORDER — SIMETHICONE 80 MG PO CHEW
80.0000 mg | CHEWABLE_TABLET | Freq: Four times a day (QID) | ORAL | Status: DC | PRN
  Administered 2017-09-28: 80 mg via ORAL
  Filled 2017-09-28 (×2): qty 1

## 2017-09-28 MED ORDER — MELATONIN 5 MG PO TABS
5.0000 mg | ORAL_TABLET | Freq: Every day | ORAL | Status: DC
  Administered 2017-09-28: 5 mg via ORAL
  Filled 2017-09-28 (×2): qty 1

## 2017-09-28 NOTE — Discharge Instructions (Signed)
[facility] [place] at [facility] ° °DIET:  °Cardiac diet ° °DISCHARGE CONDITION:  °Stable ° °ACTIVITY:  °Activity as tolerated ° °OXYGEN:  °Home Oxygen: No. °  °Oxygen Delivery: room air ° °DISCHARGE LOCATION:  °home  ° ° °ADDITIONAL DISCHARGE INSTRUCTION: ° ° °If you experience worsening of your admission symptoms, develop shortness of breath, life threatening emergency, suicidal or homicidal thoughts you must seek medical attention immediately by calling 911 or calling your MD immediately  if symptoms less severe. ° °You Must read complete instructions/literature along with all the possible adverse reactions/side effects for all the Medicines you take and that have been prescribed to you. Take any new Medicines after you have completely understood and accpet all the possible adverse reactions/side effects.  ° °Please note ° °You were cared for by a hospitalist during your hospital stay. If you have any questions about your discharge medications or the care you received while you were in the hospital after you are discharged, you can call the unit and asked to speak with the hospitalist on call if the hospitalist that took care of you is not available. Once you are discharged, your primary care physician will handle any further medical issues. Please note that NO REFILLS for any discharge medications will be authorized once you are discharged, as it is imperative that you return to your primary care physician (or establish a relationship with a primary care physician if you do not have one) for your aftercare needs so that they can reassess your need for medications and monitor your lab values. ° ° °

## 2017-09-28 NOTE — Progress Notes (Signed)
Pt returned from echo lab. Ambulated around the  Loop and tolerated it well. Pt to be discharged today. Iv and tele removed. disch instructions given to pt and husband.

## 2017-09-28 NOTE — Progress Notes (Signed)
New orders per Dr. Jerelyn Charles for insomnia and gas pain. See emar.

## 2017-09-29 ENCOUNTER — Telehealth: Payer: Self-pay | Admitting: Internal Medicine

## 2017-09-29 LAB — ECHOCARDIOGRAM COMPLETE
Area-P 1/2: 3.73 cm2
EERAT: 10.62
EWDT: 201 ms
FS: 34 % (ref 28–44)
HEIGHTINCHES: 64 in
IVS/LV PW RATIO, ED: 1.17
LA ID, A-P, ES: 36 mm
LA diam index: 2.18 cm/m2
LA vol index: 26.9 mL/m2
LA vol: 44.5 mL
LAVOLA4C: 40.6 mL
LEFT ATRIUM END SYS DIAM: 36 mm
LV E/e' medial: 10.62
LV E/e'average: 10.62
LV PW d: 7.18 mm — AB (ref 0.6–1.1)
LV TDI E'LATERAL: 7.72
LVELAT: 7.72 cm/s
MV Dec: 201
MV pk A vel: 97.5 m/s
MV pk E vel: 82 m/s
MVPG: 3 mmHg
P 1/2 time: 59 ms
RV LATERAL S' VELOCITY: 12.9 cm/s
TAPSE: 24.2 mm
TDI e' medial: 10
WEIGHTICAEL: 2119 [oz_av]

## 2017-09-30 NOTE — Discharge Summary (Signed)
[facility] - [place] at [place], 82 y.o., DOB [birth year], MRN [id number]. Admission date: 09/27/2017 Discharge Date 09/30/2017 Primary MD Sofie Hartigan, MD Admitting Physician Nicholes Mango, MD  Admission Diagnosis  Transient hypotension [I95.9] Syncope, unspecified syncope type [R55] Electrocardiogram showing T wave abnormalities [R94.31]  Discharge Diagnosis   Active Problems: Syncope due to vasovagal response as well as hypotension Essential hypertension History of breast cancer      Hospital Course  [name]  is a 82 y.o. female with a known history of breast cancer status post lumpectomy, radiation and chemotherapies, essential hypertension is brought into the ED after she had a passing out spell. Patient has left lower quadrant abdominal pain while having a bowel movement and she was on commode and she passed out.  Patient was noted to have some hypotension in the ED.  She was given IV fluids monitored on telemetry had a echocardiogram which showed no significant abnormality.  Patient is now back to baseline she is stable for discharge no arrhythmia noted on telemetry              Consults  None  Significant Tests:  See full reports for all details    Nm Myocar Multi W/spect W/wall Motion / Ef  Result Date: 09/16/2017  Normal myocardial perfusion stress test without ischemia or scar.  Moderately impaired exercise capacity with normal blood pressure response.  The left ventricular ejection fraction is hyperdynamic (>65%).  This is a low risk study.        Today   Subjective:   Cynthia Walker feeling better denies any complete  Objective:   Blood pressure 119/63, pulse 70, temperature 98.2 F (36.8 C), resp. rate 17, height 5\' 4"  (1.626 m), weight 132 lb 7 oz (60.1 kg), SpO2 96 %.  . No intake or output data in the 24 hours ending 09/30/17 1420  Exam VITAL SIGNS: Blood pressure 119/63, pulse 70, temperature  98.2 F (36.8 C), resp. rate 17, height 5\' 4"  (1.626 m), weight 132 lb 7 oz (60.1 kg), SpO2 96 %.  GENERAL:  82 y.o.-year-old patient lying in the bed with no acute distress.  EYES: Pupils equal, round, reactive to light and accommodation. No scleral icterus. Extraocular muscles intact.  HEENT: Head atraumatic, normocephalic. Oropharynx and nasopharynx clear.  NECK:  Supple, no jugular venous distention. No thyroid enlargement, no tenderness.  LUNGS: Normal breath sounds bilaterally, no wheezing, rales,rhonchi or crepitation. No use of accessory muscles of respiration.  CARDIOVASCULAR: S1, S2 normal. No murmurs, rubs, or gallops.  ABDOMEN: Soft, nontender, nondistended. Bowel sounds present. No organomegaly or mass.  EXTREMITIES: No pedal edema, cyanosis, or clubbing.  NEUROLOGIC: Cranial nerves II through XII are intact. Muscle strength 5/5 in all extremities. Sensation intact. Gait not checked.  PSYCHIATRIC: The patient is alert and oriented x 3.  SKIN: No obvious rash, lesion, or ulcer.   Data Review     CBC w Diff:  Lab Results  Component Value Date   WBC 6.7 09/28/2017   HGB 12.6 09/28/2017   HGB 14.4 11/25/2013   HCT 37.3 09/28/2017   HCT 42.4 11/25/2013   PLT 220 09/28/2017   PLT 258 11/25/2013   LYMPHOPCT 23 04/23/2017   LYMPHOPCT 19.3 11/25/2013   MONOPCT 9 04/23/2017   MONOPCT 6.8 11/25/2013   EOSPCT 1 04/23/2017   EOSPCT 0.6 11/25/2013   BASOPCT 1 04/23/2017   BASOPCT 0.7 11/25/2013   CMP:  Lab Results  Component Value Date  NA 139 09/28/2017   NA 130 (L) 05/06/2013   K 3.8 09/28/2017   K 3.8 05/06/2013   CL 109 09/28/2017   CL 96 (L) 05/06/2013   CO2 25 09/28/2017   CO2 29 05/06/2013   BUN 10 09/28/2017   BUN 9 05/06/2013   CREATININE 0.58 09/28/2017   CREATININE 0.77 11/25/2013   PROT 7.3 09/27/2017   PROT 7.8 11/25/2013   ALBUMIN 4.1 09/27/2017   ALBUMIN 4.3 11/25/2013   BILITOT 0.3 09/27/2017   BILITOT 0.4 11/25/2013   ALKPHOS 81 09/27/2017    ALKPHOS 101 11/25/2013   AST 21 09/27/2017   AST 19 11/25/2013   ALT 13 (L) 09/27/2017   ALT 25 11/25/2013  .  Micro Results No results found for this or any previous visit (from the past 240 hour(s)).   Code Status History    Date Active Date Inactive Code Status Order ID Comments User Context   09/27/2017 19:53 09/28/2017 16:11 Full Code [id number]  Nicholes Mango, MD Inpatient    Advance Directive Documentation     Most Recent Value  Type of Advance Directive  Living will, Healthcare Power of Attorney  Pre-existing out of facility DNR order (yellow form or pink MOST form)  No data  "MOST" Form in Place?  No data          Follow-up Information    Feldpausch, Chrissie Noa, MD In 1 week.   Specialty:  Family Medicine Why:  f/u echo results and bp, Please call Dr. Jorene Minors office to make this appointment! Contact information: [place] [address] [phone]           Discharge Medications   Allergies as of 09/28/2017      Reactions   Flu Virus Vaccine    Other reaction(s): Other (See Comments) Stomach cramps   Isoflavones    Other reaction(s): Other (See Comments) Soy causes legs to feel like rubber   Latex Swelling   Other    Other reaction(s): Unknown Allergy to eggs   Procaine Other (See Comments)   weakness   Penicillins Rash   Has patient had a PCN reaction causing immediate rash, facial/tongue/throat swelling, SOB or lightheadedness with hypotension: No Has patient had a PCN reaction causing severe rash involving mucus membranes or skin necrosis: No Has patient had a PCN reaction that required hospitalization: No Has patient had a PCN reaction occurring within the last 10 years: No If all of the above answers are "NO", then may proceed with Cephalosporin use.      Medication List    STOP taking these medications   lisinopril 10 MG tablet Commonly known as:  PRINIVIL,ZESTRIL     TAKE these medications   aspirin EC 81 MG  tablet Take 1 tablet (81 mg total) by mouth daily.   MULTI-VITAMINS Tabs Take by mouth.          Total Time in preparing paper work, data evaluation and todays exam - 35 minutes  Dustin Flock M.D on 09/30/2017 at 2:20 PM  [facility]   Office  [phone]

## 2018-03-29 ENCOUNTER — Emergency Department
Admission: EM | Admit: 2018-03-29 | Discharge: 2018-03-29 | Disposition: A | Discharge status: Home / Self Care | Payer: Medicare HMO | Attending: Emergency Medicine | Admitting: Emergency Medicine

## 2018-03-29 ENCOUNTER — Emergency Department
Admission: EM | Admit: 2018-03-29 | Discharge: 2018-03-29 | Disposition: A | Discharge status: Home / Self Care | Payer: Medicare HMO | Source: Home / Self Care | Attending: Emergency Medicine | Admitting: Emergency Medicine

## 2018-03-29 ENCOUNTER — Encounter: Payer: Self-pay | Admitting: Emergency Medicine

## 2018-03-29 DIAGNOSIS — I10 Essential (primary) hypertension: Secondary | ICD-10-CM

## 2018-03-29 DIAGNOSIS — Z7982 Long term (current) use of aspirin: Secondary | ICD-10-CM | POA: Insufficient documentation

## 2018-03-29 DIAGNOSIS — Z91040 Latex allergy status: Secondary | ICD-10-CM | POA: Insufficient documentation

## 2018-03-29 DIAGNOSIS — Z79899 Other long term (current) drug therapy: Secondary | ICD-10-CM | POA: Insufficient documentation

## 2018-03-29 DIAGNOSIS — W57XXXA Bitten or stung by nonvenomous insect and other nonvenomous arthropods, initial encounter: Secondary | ICD-10-CM | POA: Insufficient documentation

## 2018-03-29 DIAGNOSIS — R21 Rash and other nonspecific skin eruption: Secondary | ICD-10-CM

## 2018-03-29 DIAGNOSIS — W57XXXD Bitten or stung by nonvenomous insect and other nonvenomous arthropods, subsequent encounter: Secondary | ICD-10-CM

## 2018-03-29 MED ORDER — DOXYCYCLINE HYCLATE 100 MG PO TABS
100.0000 mg | ORAL_TABLET | Freq: Once | ORAL | Status: AC
  Administered 2018-03-29: 100 mg via ORAL
  Filled 2018-03-29: qty 1

## 2018-03-29 MED ORDER — DOXYCYCLINE HYCLATE 100 MG PO CAPS: 100.0000 mg | ORAL_CAPSULE | Freq: Two times a day (BID) | ORAL | 0 refills | Status: DC

## 2018-03-29 MED ORDER — RANITIDINE HCL 300 MG PO TABS: 300.0000 mg | ORAL_TABLET | Freq: Every day | ORAL | 0 refills | Status: DC

## 2018-03-29 MED ORDER — METHYLPREDNISOLONE 4 MG PO TBPK: ORAL_TABLET | ORAL | 0 refills | Status: DC

## 2018-03-29 MED ORDER — DIPHENHYDRAMINE HCL 25 MG PO CAPS
50.0000 mg | ORAL_CAPSULE | Freq: Once | ORAL | Status: AC
  Administered 2018-03-29: 50 mg via ORAL
  Filled 2018-03-29: qty 2

## 2018-03-29 NOTE — ED Triage Notes (Signed)
Patient was seen earlier in the day today for rash and itching.  Patient took prednisone but has not taken antibiotic or antihistamine she was prescribed.  Patient states rash appears worse.

## 2018-03-29 NOTE — ED Triage Notes (Signed)
Patient presents to the ED with rash/itching to her neck, arms, torso and buttocks.  Patient first noticed the itching yesterday evening.  Patient denies any throat swelling or shortness of breath.  Denies recently changing soaps or detergents.

## 2018-03-29 NOTE — ED Notes (Signed)
First Nurse Note: Pt to ED c/o rash all over her body. Pt is in NAD at this time

## 2018-03-29 NOTE — Discharge Instructions (Signed)
Take the antibiotics that were prescribed to you.  Take prednisone and Benadryl for your symptoms.  Follow-up with your doctor in 2 days.  Return to the emergency room if you have headache, neck stiffness, fever, or if the rash involves your membranes such as mouth, eyes, or private parts.

## 2018-03-29 NOTE — ED Provider Notes (Signed)
[facility] Emergency Department Provider Note  ____________________________________________  Time seen: Approximately 6:43 PM  I have reviewed the triage vital signs and the nursing notes.   HISTORY  Chief Complaint Rash   HPI Cynthia Walker is a 83 y.o. female with history as listed below who presents for evaluation of a rash.  Patient was seen here earlier today and was diagnosed with rash from a tick bite.  She was prescribed doxycycline, prednisone, and antihistamines and discharged home.  She reports that she took her dose of prednisone but did not take any of the other medications.  She returns today because the rash has gotten worse.  Patient denies moist mucous membranes involvement, changes in vision, fever, headache, neck stiffness.  The rash is red, diffuse in her torso and arms, pruritic in nature, and has been present since yesterday.  Patient did remove a tick from her right inner arm a few weeks ago.  Past Medical History:  Diagnosis Date  . Breast cancer [facility]) 2009   Right breast CA with lumpectomy, radiation and chemo tx's.  Marland Kitchen Hypertension   . Personal history of chemotherapy   . Personal history of radiation therapy     Patient Active Problem List   Diagnosis Date Noted  . Syncope 09/27/2017  . Atypical chest pain 09/03/2017  . Essential hypertension 09/03/2017  . Short-term memory loss 09/03/2017  . Osteoporosis 04/02/2016  . Breast cancer, right ([place]) 04/06/2008    Past Surgical History:  Procedure Laterality Date  . BREAST EXCISIONAL BIOPSY Right 2009  . COLONOSCOPY WITH PROPOFOL N/A 07/10/2015   Procedure: COLONOSCOPY WITH PROPOFOL;  Surgeon: Hulen Luster, MD;  Location: [facility] ENDOSCOPY;  Service: Gastroenterology;  Laterality: N/A;  . TONSILLECTOMY     age of 83 years old    Prior to Admission medications   Medication Sig Start Date End Date Taking? Authorizing Provider  aspirin EC 81 MG tablet Take 1 tablet (81 mg total)  by mouth daily. Patient not taking: Reported on 09/27/2017 09/02/17   End, Harrell Gave, MD  doxycycline (VIBRAMYCIN) 100 MG capsule Take 1 capsule (100 mg total) by mouth 2 (two) times daily. 03/29/18   Fisher, Linden Dolin, PA-C  methylPREDNISolone (MEDROL DOSEPAK) 4 MG TBPK tablet Take 6 pills on day one then decrease by 1 pill each day 03/29/18   Versie Starks, PA-C  Multiple Vitamin (MULTI-VITAMINS) TABS Take by mouth.    [provider]  ranitidine (ZANTAC) 300 MG tablet Take 1 tablet (300 mg total) by mouth at bedtime. 03/29/18   Versie Starks, PA-C    Allergies Flu virus vaccine; Isoflavones; Latex; Other; Procaine; and Penicillins  Family History  Problem Relation Age of Onset  . Cancer Brother        lung cancer  . Cancer Sister        not sure  . Heart disease Father   . Hypertension Father   . Breast cancer Neg Hx     Social History Social History   Tobacco Use  . Smoking status: Never Smoker  . Smokeless tobacco: Never Used  Substance Use Topics  . Alcohol use: No  . Drug use: No    Review of Systems  Constitutional: Negative for fever. Eyes: Negative for visual changes. ENT: Negative for sore throat. Neck: No neck pain  Cardiovascular: Negative for chest pain. Respiratory: Negative for shortness of breath. Gastrointestinal: Negative for abdominal pain, vomiting or diarrhea. Genitourinary: Negative for dysuria. Musculoskeletal: Negative for  back pain. Skin: + rash. Neurological: Negative for headaches, weakness or numbness. Psych: No SI or HI  ____________________________________________   PHYSICAL EXAM:  VITAL SIGNS: ED Triage Vitals [03/29/18 1738]  Enc Vitals Group     BP 129/76     Pulse Rate 96     Resp 16     Temp 98.2 F (36.8 C)     Temp Source Oral     SpO2 97 %     Weight 125 lb (56.7 kg)     Height 5\' 4"  (1.626 m)     Head Circumference      Peak Flow      Pain Score 0     Pain Loc      Pain Edu?      Excl. in [place]?      Constitutional: Alert and oriented. Well appearing and in no apparent distress. HEENT:      Head: Normocephalic and atraumatic.         Eyes: Conjunctivae are normal. Sclera is non-icteric.       Mouth/Throat: Mucous membranes are moist.       Neck: Supple with no signs of meningismus. Cardiovascular: Regular rate and rhythm. No murmurs, gallops, or rubs. 2+ symmetrical distal pulses are present in all extremities. No JVD. Respiratory: Normal respiratory effort. Lungs are clear to auscultation bilaterally. No wheezes, crackles, or rhonchi.  Gastrointestinal: Soft, non tender, and non distended with positive bowel sounds. No rebound or guarding. Musculoskeletal: Nontender with normal range of motion in all extremities. No edema, cyanosis, or erythema of extremities. Neurologic: Normal speech and language. Face is symmetric. Moving all extremities. No gross focal neurologic deficits are appreciated. Skin: Skin is warm, dry and intact.  Diffuse macular papular blanching erythematous rash with excoriations and hives throughout the torso and bilateral upper extremities.  No mucous membrane involvement Psychiatric: Mood and affect are normal. Speech and behavior are normal.  ____________________________________________   LABS (all labs ordered are listed, but only abnormal results are displayed)  Labs Reviewed - No data to display ____________________________________________  EKG  none  ____________________________________________  RADIOLOGY  none  ____________________________________________   PROCEDURES  Procedure(s) performed: None Procedures Critical Care performed:  None ____________________________________________   INITIAL IMPRESSION / ASSESSMENT AND PLAN / ED COURSE  83 y.o. female with history as listed below who presents for evaluation of a rash.  Patient seen earlier today and diagnosed with rash from a tick bite.  She has not started her doxycycline.  She was  concerned because the rash was getting worse.  I explained to her they will take several days on the medications that she was prescribed for the rash to start to improve.  She had not received a dose of doxycycline or antihistamine here and that was given to her during this visit.  She has prescriptions for these medications that has already filled them.  Her husband reports that they are at home.  Patient has no mucous membrane involvement and her exam is otherwise within normal limits.  Discussed return precautions.  Discussed follow-up with primary care doctor.      As part of my medical decision making, I reviewed the following data within the [facility] notes reviewed and incorporated, Old chart reviewed, Notes from prior ED visits and [place] Controlled Substance Database    Pertinent labs & imaging results that were available during my care of the patient were reviewed by me and considered in my medical decision making (  see chart for details).    ____________________________________________   FINAL CLINICAL IMPRESSION(S) / ED DIAGNOSES  Final diagnoses:  Rash  Tick bite, subsequent encounter      NEW MEDICATIONS STARTED DURING THIS VISIT:  ED Discharge Orders    None       Note:  This document was prepared using Dragon voice recognition software and may include unintentional dictation errors.    Alfred Levins, [place], MD 03/29/18 [phone]

## 2018-03-29 NOTE — ED Provider Notes (Signed)
[facility] Emergency Department Provider Note  ____________________________________________   First MD Initiated Contact with Patient 03/29/18 1017     (approximate)  I have reviewed the triage vital signs and the nursing notes.   HISTORY  Chief Complaint Rash    HPI Cynthia Walker is a 83 y.o. female presents to the emergency department complaining of a itchy rash on her neck, arms, torso and buttocks.  She stated the itching started yesterday.  She denies any new exposures.  However, she does have a recent tick bite to the right inner arm.  She states the area is still red and swollen surrounding this tick bite.  She denies any fever or chills.  Past Medical History:  Diagnosis Date  . Breast cancer [facility]) 2009   Right breast CA with lumpectomy, radiation and chemo tx's.  Marland Kitchen Hypertension   . Personal history of chemotherapy   . Personal history of radiation therapy     Patient Active Problem List   Diagnosis Date Noted  . Syncope 09/27/2017  . Atypical chest pain 09/03/2017  . Essential hypertension 09/03/2017  . Short-term memory loss 09/03/2017  . Osteoporosis 04/02/2016  . Breast cancer, right ([place]) 04/06/2008    Past Surgical History:  Procedure Laterality Date  . BREAST EXCISIONAL BIOPSY Right 2009  . COLONOSCOPY WITH PROPOFOL N/A 07/10/2015   Procedure: COLONOSCOPY WITH PROPOFOL;  Surgeon: Hulen Luster, MD;  Location: [facility] ENDOSCOPY;  Service: Gastroenterology;  Laterality: N/A;  . TONSILLECTOMY     age of 83 years old    Prior to Admission medications   Medication Sig Start Date End Date Taking? Authorizing Provider  aspirin EC 81 MG tablet Take 1 tablet (81 mg total) by mouth daily. Patient not taking: Reported on 09/27/2017 09/02/17   End, Harrell Gave, MD  doxycycline (VIBRAMYCIN) 100 MG capsule Take 1 capsule (100 mg total) by mouth 2 (two) times daily. 03/29/18   Fisher, Linden Dolin, PA-C  methylPREDNISolone (MEDROL DOSEPAK) 4 MG  TBPK tablet Take 6 pills on day one then decrease by 1 pill each day 03/29/18   Versie Starks, PA-C  Multiple Vitamin (MULTI-VITAMINS) TABS Take by mouth.    [provider]  ranitidine (ZANTAC) 300 MG tablet Take 1 tablet (300 mg total) by mouth at bedtime. 03/29/18   Versie Starks, PA-C    Allergies Flu virus vaccine; Isoflavones; Latex; Other; Procaine; and Penicillins  Family History  Problem Relation Age of Onset  . Cancer Brother        lung cancer  . Cancer Sister        not sure  . Heart disease Father   . Hypertension Father   . Breast cancer Neg Hx     Social History Social History   Tobacco Use  . Smoking status: Never Smoker  . Smokeless tobacco: Never Used  Substance Use Topics  . Alcohol use: No  . Drug use: No    Review of Systems  Constitutional: No fever/chills Eyes: No visual changes. ENT: No sore throat. Respiratory: Denies cough Genitourinary: Negative for dysuria. Musculoskeletal: Negative for back pain. Skin: Positive for rash.  Positive for tick bite    ____________________________________________   PHYSICAL EXAM:  VITAL SIGNS: ED Triage Vitals  Enc Vitals Group     BP 03/29/18 0956 126/68     Pulse Rate 03/29/18 0956 92     Resp 03/29/18 0956 14     Temp 03/29/18 0956 98.5 F (36.9 C)  Temp Source 03/29/18 0956 Oral     SpO2 03/29/18 0956 97 %     Weight 03/29/18 0957 125 lb (56.7 kg)     Height 03/29/18 0957 5\' 4"  (1.626 m)     Head Circumference --      Peak Flow --      Pain Score 03/29/18 1005 0     Pain Loc --      Pain Edu? --      Excl. in [place]? --     Constitutional: Alert and oriented. Well appearing and in no acute distress. Eyes: Conjunctivae are normal.  Head: Atraumatic. Nose: No congestion/rhinnorhea. Mouth/Throat: Mucous membranes are moist.   Cardiovascular: Normal rate, regular rhythm. Respiratory: Normal respiratory effort.  No retractions GU: deferred Musculoskeletal: FROM all  extremities, warm and well perfused Neurologic:  Normal speech and language.  Skin:  Skin is warm, dry and intact.  Scabbed area at the site of the tick bite.  There is a pink ring around the bite that is 3 inches x 3 inches.  The remainder of the rash looks hive-like with some circular round spots  psychiatric: Mood and affect are normal. Speech and behavior are normal.  ____________________________________________   LABS (all labs ordered are listed, but only abnormal results are displayed)  Labs Reviewed - No data to display ____________________________________________   ____________________________________________  RADIOLOGY    ____________________________________________   PROCEDURES  Procedure(s) performed: No  Procedures    ____________________________________________   INITIAL IMPRESSION / ASSESSMENT AND PLAN / ED COURSE  Pertinent labs & imaging results that were available during my care of the patient were reviewed by me and considered in my medical decision making (see chart for details).  Patient is a 83 year old female presents emergency department complaining of a rash and a recent tick bite.  She denies fever chills.  Physical exam the rash near the tick bite is pink and circular measuring about 3 inches x 3 inches.  The remainder of the rash is hive-like however some are circular like erythema multiforme.  Discussed the findings with the patient and her husband.  They are to continue the Benadryl as he states she is taking.  She was given a prescription for doxycycline and Medrol Dosepak.  If she is not better in 3 to 5 days she is to follow-up with her regular doctor.  They both state they understand will comply with instructions.  She was discharged in stable condition     As part of my medical decision making, I reviewed the following data within the [place] notes reviewed and incorporated, Notes from prior ED visits and [place]  Controlled Substance Database  ____________________________________________   FINAL CLINICAL IMPRESSION(S) / ED DIAGNOSES  Final diagnoses:  Tick bite, initial encounter  Rash      NEW MEDICATIONS STARTED DURING THIS VISIT:  New Prescriptions   DOXYCYCLINE (VIBRAMYCIN) 100 MG CAPSULE    Take 1 capsule (100 mg total) by mouth 2 (two) times daily.   METHYLPREDNISOLONE (MEDROL DOSEPAK) 4 MG TBPK TABLET    Take 6 pills on day one then decrease by 1 pill each day   RANITIDINE (ZANTAC) 300 MG TABLET    Take 1 tablet (300 mg total) by mouth at bedtime.     Note:  This document was prepared using Dragon voice recognition software and may include unintentional dictation errors.    Versie Starks, PA-C 03/29/18 1034    Lavonia Drafts, MD 03/29/18 [phone]

## 2018-03-29 NOTE — Discharge Instructions (Addendum)
Follow-up with your regular doctor if not better in 3 days.  Use medication as prescribed.  You may continue to take Benadryl or switch to Claritin.

## 2018-04-07 ENCOUNTER — Ambulatory Visit
Admission: RE | Admit: 2018-04-07 | Discharge: 2018-04-07 | Disposition: A | Discharge status: Home / Self Care | Payer: Medicare HMO | Source: Ambulatory Visit | Attending: Hematology and Oncology | Admitting: Hematology and Oncology

## 2018-04-07 ENCOUNTER — Telehealth: Payer: Self-pay | Admitting: *Deleted

## 2018-04-07 DIAGNOSIS — M810 Age-related osteoporosis without current pathological fracture: Secondary | ICD-10-CM | POA: Insufficient documentation

## 2018-04-07 DIAGNOSIS — C50911 Malignant neoplasm of unspecified site of right female breast: Secondary | ICD-10-CM

## 2018-04-07 DIAGNOSIS — Z170 Estrogen receptor positive status [ER+]: Principal | ICD-10-CM

## 2018-04-07 NOTE — Telephone Encounter (Signed)
-----   Message from Karen Kitchens, NP sent at 04/07/2018  4:28 PM EDT ----- Regarding: Call Bone density back that demonstrated significant osteoporosis.   Does she take calcium and vitamin D?   Would she interested in Prolia? If so, work on [employer] and dental clearance.   Gaspar Bidding

## 2018-04-07 NOTE — Telephone Encounter (Signed)
Called patient to inform her that her bone density showed significant osteoporosis.  MD wants to know if she is taking Calcium and Vitamin D?  If so how much.  Also inquired if patient would be interested in Prolia.  Patient states she does not take Calcium or Vitamin D on a regular basis.  She is not interested in taking Prolia.    Per Gaspar Bidding, patient should take Calcium 1200 mg daily and Vit. D 800 mg daily.  Relayed this to patient.  She verbalized understanding and repeated back to me.

## 2018-04-29 ENCOUNTER — Ambulatory Visit: Payer: Medicare Other | Admitting: Hematology and Oncology

## 2018-04-29 ENCOUNTER — Other Ambulatory Visit: Payer: Medicare Other

## 2018-05-05 NOTE — Progress Notes (Signed)
[facility] day:  05/06/18  Chief Complaint: Cynthia Walker is a 83 y.o. female with stage IA right breast cancer and osteoporosis for 1 year assessment.  HPI:  The patient was last seen in the medical oncology clinic on 04/23/2017.  At that time, patient was doing well.  She denied any physical complaints.  Exam was stable, revealing postoperative changes at the 7 o'clock position in the RIGHT breast.  CA27.29 was 20.9  Patient was seen in the ED on 07/11/2017 by Dr. Lisa Roca.  Notes reviewed. Patient presented with a sensation of a "golf ball lump" at the base of her neck and upper chest.  No dysphasia or shortness of breath.  Chest x-ray performed that revealed no  acute cardiopulmonary concerns.  Labs were unremarkable.  Patient was diagnosed with esophageal spasm and discharged home in stable condition.  Patient was seen in follow-up consult 09/02/2017 by Dr. Harrell Gave End (cardiology).  Notes reviewed.  Patient complained of a single episode of chest pain that lasted for about 10 minutes.  In review of the notes, this was the same episode that sent the patient to the ER on 07/11/2018.  At that time, EKG showed occasional PVCs.  Patient noted to be a poor historian due to memory issues.  Recommended exercise myocardial perfusion stress test and daily ASA 81 mg.    Myoview was done on 09/16/2017 revealing no evidence of ischemia.  There was moderately impaired exercise capacity with normal blood pressure response.  Ejection fraction greater than 65%.  Low risk study.   Patient was seen in the ED on 09/27/2017 by Dr. Delman Kitten.  Notes reviewed.  Patient presented via EMS for vasovagal syncope episode.  Patient was "unconscious" for a 5-minute period of time.  Initial blood pressure 83/60, which was treated with IV fluid bolus, thus returning her to a normotensive reading. EKG revealed minor T wave changes in V2.  Cycle troponins x3 were negative.   Patient was ultimately admitted to the hospital for further evaluation.  Patient was admitted to [facility] from 09/27/2017 - 09/28/2017.  Echocardiogram performed and found to be normal, demonstrating an EF of 65 to 70%.  Patient had a uneventful hospitalization and was discharged on 09/28/2017 with instructions to follow-up with her PCP and cardiologist.    Patient was seen in the ED on 03/29/2018 on 2 separate occassions.  Patient with a pruritic rash following tick removal.  She was prescribed a course of doxycycline and antihistamines.  She was discharged home to follow-up with her primary care physician.  Patient had routine bone density study done on 04/07/2018 that revealed a T score of -3.4 (previous -3.2), which is consistent with known osteoporosis.  Of note, patient stopped her oral bisphosphonate (Fosamax) in 2017.  She was contacted to ensure that she was taking calcium and vitamin D.  Despite education in clinic on 04/23/2017, patient was not taking supplements as directed.  She was encouraged to start calcium 1200 mg and vitamin D 800 IU daily.  Routine mammogram done on 04/07/2018 revealed no mammographic evidence of malignancy in either breast.  In the interim, patient has been doing well. She denies any acute symptoms.  Patient denies that she has experienced any B symptoms. She denies any interval infections. Patient does not verbalize any concerns with regards to her breasts today. Patient performs monthly self breast examinations as recommended.   Patient advises that she maintains an adequate appetite. She is  eating well. Weight today is 137 lb 2 oz (62.2 kg), which compared to her last visit to the clinic, represents a  2 pound increase.   Patient denies pain in the clinic today.   Past Medical History:  Diagnosis Date  . Breast cancer [facility]) 2009   Right breast CA with lumpectomy, radiation and chemo tx's.  Marland Kitchen Hypertension   . Personal history of chemotherapy   . Personal  history of radiation therapy     Past Surgical History:  Procedure Laterality Date  . BREAST BIOPSY Right 03/15/2008   invasive mammary carcinoma  . BREAST LUMPECTOMY Right 04/14/2008   Pathology revealed a 1.1 cm grade I invasive ductal carcinoma. Margins were negative.   . COLONOSCOPY WITH PROPOFOL N/A 07/10/2015   Procedure: COLONOSCOPY WITH PROPOFOL;  Surgeon: Hulen Luster, MD;  Location: [facility] ENDOSCOPY;  Service: Gastroenterology;  Laterality: N/A;  . TONSILLECTOMY     age of 83 years old    Family History  Problem Relation Age of Onset  . Cancer Brother        lung cancer  . Cancer Sister        not sure  . Heart disease Father   . Hypertension Father   . Breast cancer Neg Hx     Social History:  reports that she has never smoked. She has never used smokeless tobacco. She reports that she does not drink alcohol or use drugs.  Her husband's name is [name].  She lives in [place].  Allergies:  Allergies  Allergen Reactions  . Flu Virus Vaccine     Other reaction(s): Other (See Comments) Stomach cramps  . Isoflavones     Other reaction(s): Other (See Comments) Soy causes legs to feel like rubber  . Latex Swelling  . Other     Other reaction(s): Unknown Allergy to eggs  . Procaine Other (See Comments)    weakness  . Penicillins Rash    Has patient had a PCN reaction causing immediate rash, facial/tongue/throat swelling, SOB or lightheadedness with hypotension: No Has patient had a PCN reaction causing severe rash involving mucus membranes or skin necrosis: No Has patient had a PCN reaction that required hospitalization: No Has patient had a PCN reaction occurring within the last 10 years: No If all of the above answers are "NO", then may proceed with Cephalosporin use.     Current Medications: Current Outpatient Medications  Medication Sig Dispense Refill  . lisinopril (PRINIVIL,ZESTRIL) 10 MG tablet Take 10 mg by mouth daily.    . Multiple Vitamin (MULTI-VITAMINS)  TABS Take by mouth.     No current facility-administered medications for this visit.     Review of Systems  Constitutional: Negative for diaphoresis, fever, malaise/fatigue and weight loss.       "I don't think anything is is wrong".  HENT: Negative.   Eyes: Negative.   Respiratory: Negative for cough, hemoptysis, sputum production and shortness of breath.   Cardiovascular: Negative for chest pain, palpitations, orthopnea, leg swelling and PND.  Gastrointestinal: Negative for abdominal pain, blood in stool, constipation, diarrhea, melena, nausea and vomiting.  Genitourinary: Negative for dysuria, frequency, hematuria and urgency.  Musculoskeletal: Negative for back pain, falls, joint pain and myalgias.  Skin: Negative for itching and rash.  Neurological: Negative for dizziness, tremors, weakness and headaches.  Endo/Heme/Allergies: Does not bruise/bleed easily.  Psychiatric/Behavioral: Positive for memory loss. Negative for depression and suicidal ideas. The patient is not nervous/anxious and does not have insomnia.   All  other systems reviewed and are negative.  Performance status (ECOG): 0 - Asymptomatic  Vital Signs BP 110/69 (BP Location: Left Arm, Patient Position: Sitting)   Pulse 77   Temp 98.2 F (36.8 C) (Tympanic)   Resp 18   Wt 137 lb 2 oz (62.2 kg)   BMI 23.54 kg/m   Physical Exam  Constitutional: She is oriented to person, place, and time and well-developed, well-nourished, and in no distress.  HENT:  Head: Normocephalic and atraumatic.  Gray hair  Eyes: Pupils are equal, round, and reactive to light. EOM are normal. No scleral icterus.  Glasses.  Brown/hazel eyes  Neck: Normal range of motion. Neck supple. No tracheal deviation present. No thyromegaly present.  Cardiovascular: Normal rate, regular rhythm and normal heart sounds. Exam reveals no gallop and no friction rub.  No murmur heard. Pulmonary/Chest: Effort normal and breath sounds normal. No respiratory  distress. She has no wheezes. She has no rales. Right breast exhibits tenderness. Right breast exhibits no inverted nipple, no mass, no nipple discharge and no skin change (Mild postoperative changes at the 7 o'clock position - stable). Left breast exhibits tenderness. Left breast exhibits no inverted nipple, no mass, no nipple discharge and no skin change.  Abdominal: Soft. Bowel sounds are normal. She exhibits no distension. There is no tenderness.  Musculoskeletal: Normal range of motion. She exhibits no edema or tenderness.  Lymphadenopathy:    She has no cervical adenopathy.    She has no axillary adenopathy.       Right: No inguinal and no supraclavicular adenopathy present.       Left: No inguinal and no supraclavicular adenopathy present.  Neurological: She is alert and oriented to person, place, and time.  Skin: Skin is warm and dry. No rash noted. No erythema.  Psychiatric: Mood, affect and judgment normal.  Nursing note and vitals reviewed.   Appointment on 05/06/2018  Component Date Value Ref Range Status  . Sodium 05/06/2018 139  135 - 145 mmol/L Final  . Potassium 05/06/2018 4.1  3.5 - 5.1 mmol/L Final  . Chloride 05/06/2018 106  98 - 111 mmol/L Final  . CO2 05/06/2018 23  22 - 32 mmol/L Final  . Glucose, Bld 05/06/2018 107* 70 - 99 mg/dL Final  . BUN 05/06/2018 12  8 - 23 mg/dL Final  . Creatinine, Ser 05/06/2018 0.74  0.44 - 1.00 mg/dL Final  . Calcium 05/06/2018 9.0  8.9 - 10.3 mg/dL Final  . Total Protein 05/06/2018 6.8  6.5 - 8.1 g/dL Final  . Albumin 05/06/2018 4.0  3.5 - 5.0 g/dL Final  . AST 05/06/2018 26  15 - 41 U/L Final  . ALT 05/06/2018 14  0 - 44 U/L Final  . Alkaline Phosphatase 05/06/2018 77  38 - 126 U/L Final  . Total Bilirubin 05/06/2018 0.7  0.3 - 1.2 mg/dL Final  . GFR calc non Af Amer 05/06/2018 >60  >60 mL/min Final  . GFR calc Af Amer 05/06/2018 >60  >60 mL/min Final   Comment: (NOTE) The eGFR has been calculated using the CKD EPI equation. This  calculation has not been validated in all clinical situations. eGFR's persistently <60 mL/min signify possible Chronic Kidney Disease.   Georgiann Hahn gap 05/06/2018 10  5 - 15 Final   Performed at [facility], [address] [place], [place] [address]  . WBC 05/06/2018 7.1  3.6 - 11.0 K/uL Final  . RBC 05/06/2018 4.20  3.80 - 5.20 MIL/uL Final  .  Hemoglobin 05/06/2018 13.6  12.0 - 16.0 g/dL Final  . HCT 05/06/2018 40.2  35.0 - 47.0 % Final  . MCV 05/06/2018 95.8  80.0 - 100.0 fL Final  . MCH 05/06/2018 32.3  26.0 - 34.0 pg Final  . MCHC 05/06/2018 33.7  32.0 - 36.0 g/dL Final  . RDW 05/06/2018 13.0  11.5 - 14.5 % Final  . Platelets 05/06/2018 233  150 - 440 K/uL Final  . Neutrophils Relative % 05/06/2018 76  % Final  . Neutro Abs 05/06/2018 5.4  1.4 - 6.5 K/uL Final  . Lymphocytes Relative 05/06/2018 17  % Final  . Lymphs Abs 05/06/2018 1.2  1.0 - 3.6 K/uL Final  . Monocytes Relative 05/06/2018 5  % Final  . Monocytes Absolute 05/06/2018 0.4  0.2 - 0.9 K/uL Final  . Eosinophils Relative 05/06/2018 1  % Final  . Eosinophils Absolute 05/06/2018 0.0  0 - 0.7 K/uL Final  . Basophils Relative 05/06/2018 1  % Final  . Basophils Absolute 05/06/2018 0.1  0 - 0.1 K/uL Final   Performed at [facility], [address], [place], [place] [address]    Assessment:  Cynthia Walker is a 83 y.o. female with stage IA right breast cancer s/p lumpectomy and sentinel lymph node biopsy in 04/2008. Pathology revealed a 1.1 cm grade I invasive ductal carcinoma. Margins were negative. There was no lymphovascular invasion.  Stage was T1cN0M0.  Tumor was ER and PR positive and HER-2 negative (2+ by IHC but FISH -).  Oncotype DX testing revealed an intermediate score of 21.  She received adjuvant chemotherapy.  Chemotherapy was truncated secondary to an allergic reaction to Taxotere.  She received TC 1 followed by weekly Taxol and Cytoxan (no records available).  She received radiation.  She received 5 years of Femara (10/26/2008 - 10/2013).  CA27.29 has been followed: 16.4 on 06/20/2011, 23.4 on 01/16/2012, 20.7 on 07/23/2012, 23 on 01/11/2013, 22.5 on 11/25/2013, 20.9 on 04/23/2017, and 18.2 on 05/06/2018.  Bilateral screening mammogram on 04/03/2017 revealed no evidence of malignancy.  Bilateral screening mammogram on 04/07/2018 revealed no evidence of malignancy.  Bone density study on 01/27/2014 revealed osteoporosis with a T score of -3.6 in the AP spine and -3.7 in the right femur. Bone density study on 04/02/2016 revealed a T score of -3.4 in the AP spine (L1-L4) and -3.2 in the right femur (improved).  Bone density study on 04/07/2018 revealed osteoporosis with a T score of -3.4 in the right femur. She stopped Fosamax in 2017.  Symptomatically, patient is doing well. She denies any acute concerns. Exam reveals breast tenderness.   Plan: 1. Labs today: CBC with differential, CMP, CA27.29 2. Interval mammogram personally reviewed.  No evidence of malignancy.  Follow-up screening mammogram in 1 year (due 04/09/2019).  3. Review bone density.    T score -3.4 in the right femur consistent with known osteoporosis.    Ten-year fracture probability by FRAX of 3% or greater for hip fracture or 20% or greater for major osteoporotic fracture.  Patient has been off of oral bisphosphonate (Fosamax) since 2017.    She is taking the recommended calcium 1200 mg and vitamin D 800 IU daily as recommended.  Discuss the use of Prolia to prevent bone thinning/loss associated with endocrine therapy. Side effects of this medication reviewed. Patient provided with printed information on Prolia on today's AVS.  Discussed the need for dental clearance prior to beginning Prolia, as this medication is associated with an  increased risk of dental complications such as osteonecrosis of the jaw.  [place]  4.   RTC in 1 year for MD assessment, labs (CBC with differential, CMP, CA27.29),  and review of mammogram.    Honor Loh, NP  05/06/2018, 10:59 AM   I saw and evaluated the patient, participating in the key portions of the service and reviewing pertinent diagnostic studies and records.  I reviewed the nurse practitioner's note and agree with the findings and the plan.  The assessment and plan were discussed with the patient.  Several questions were asked by the patient and answered.   Nolon Stalls, MD 05/06/2018,10:59 AM

## 2018-05-06 ENCOUNTER — Inpatient Hospital Stay: Payer: Medicare HMO

## 2018-05-06 ENCOUNTER — Inpatient Hospital Stay: Payer: Medicare HMO | Attending: Hematology and Oncology | Admitting: Hematology and Oncology

## 2018-05-06 VITALS — BP 110/69 | HR 77 | Temp 98.2°F | Resp 18 | Wt 137.1 lb

## 2018-05-06 DIAGNOSIS — C50911 Malignant neoplasm of unspecified site of right female breast: Secondary | ICD-10-CM | POA: Diagnosis present

## 2018-05-06 DIAGNOSIS — Z170 Estrogen receptor positive status [ER+]: Secondary | ICD-10-CM | POA: Diagnosis not present

## 2018-05-06 DIAGNOSIS — M810 Age-related osteoporosis without current pathological fracture: Secondary | ICD-10-CM | POA: Diagnosis not present

## 2018-05-06 DIAGNOSIS — Z853 Personal history of malignant neoplasm of breast: Secondary | ICD-10-CM

## 2018-05-06 LAB — COMPREHENSIVE METABOLIC PANEL
ALT: 14 U/L (ref 0–44)
AST: 26 U/L (ref 15–41)
Albumin: 4 g/dL (ref 3.5–5.0)
Alkaline Phosphatase: 77 U/L (ref 38–126)
Anion gap: 10 (ref 5–15)
BUN: 12 mg/dL (ref 8–23)
CO2: 23 mmol/L (ref 22–32)
Calcium: 9 mg/dL (ref 8.9–10.3)
Chloride: 106 mmol/L (ref 98–111)
Creatinine, Ser: 0.74 mg/dL (ref 0.44–1.00)
GFR calc Af Amer: >60 mL/min (ref >60)
GFR calc non Af Amer: >60 mL/min (ref >60)
Glucose, Bld: 107 mg/dL — ABNORMAL HIGH (ref 70–99)
Potassium: 4.1 mmol/L (ref 3.5–5.1)
Sodium: 139 mmol/L (ref 135–145)
Total Bilirubin: 0.7 mg/dL (ref 0.3–1.2)
Total Protein: 6.8 g/dL (ref 6.5–8.1)

## 2018-05-06 LAB — CBC WITH DIFFERENTIAL/PLATELET
Basophils Absolute: 0.1 10*3/uL (ref 0–0.1)
Basophils Relative: 1 %
Eosinophils Absolute: 0 10*3/uL (ref 0–0.7)
Eosinophils Relative: 1 %
HCT: 40.2 % (ref 35.0–47.0)
Hemoglobin: 13.6 g/dL (ref 12.0–16.0)
Lymphocytes Relative: 17 %
Lymphs Abs: 1.2 10*3/uL (ref 1.0–3.6)
MCH: 32.3 pg (ref 26.0–34.0)
MCHC: 33.7 g/dL (ref 32.0–36.0)
MCV: 95.8 fL (ref 80.0–100.0)
Monocytes Absolute: 0.4 10*3/uL (ref 0.2–0.9)
Monocytes Relative: 5 %
Neutro Abs: 5.4 10*3/uL (ref 1.4–6.5)
Neutrophils Relative %: 76 %
Platelets: 233 10*3/uL (ref 150–440)
RBC: 4.2 MIL/uL (ref 3.80–5.20)
RDW: 13 % (ref 11.5–14.5)
WBC: 7.1 10*3/uL (ref 3.6–11.0)

## 2018-05-06 NOTE — Progress Notes (Signed)
Patient offers no complaints today. 

## 2018-05-06 NOTE — Patient Instructions (Signed)

## 2018-05-07 LAB — CA 27.29 (SERIAL MONITOR): CA 27.29: 18.2 U/mL (ref 0.0–38.6)

## 2018-06-08 ENCOUNTER — Encounter: Payer: Self-pay | Admitting: Hematology and Oncology

## 2018-06-09 ENCOUNTER — Telehealth: Payer: Self-pay | Admitting: *Deleted

## 2018-06-09 NOTE — Telephone Encounter (Signed)
-----   Message from Lequita Asal, MD sent at 06/08/2018  5:43 AM EDT ----- Regarding: Please call patient  Ask if she has decided if she would like to pursue Prolia.  M

## 2018-06-09 NOTE — Telephone Encounter (Signed)
Called patient and LVM to inquire of her decision regarding prolia,  Asked patient to call back to let me know of her decision.

## 2018-06-12 ENCOUNTER — Telehealth: Payer: Self-pay | Admitting: *Deleted

## 2018-06-12 NOTE — Telephone Encounter (Signed)
-----   Message from Lequita Asal, MD sent at 06/08/2018  5:43 AM EDT ----- Regarding: Please call patient  Ask if she has decided if she would like to pursue Prolia.  M

## 2018-06-12 NOTE — Telephone Encounter (Signed)
Called patient 2 times today and have left messages for her to call me back.

## 2018-10-11 ENCOUNTER — Other Ambulatory Visit: Payer: Self-pay

## 2018-10-11 ENCOUNTER — Emergency Department
Admission: EM | Admit: 2018-10-11 | Discharge: 2018-10-11 | Disposition: A | Discharge status: Home / Self Care | Payer: Medicare HMO | Attending: Student in an Organized Health Care Education/Training Program | Admitting: Student in an Organized Health Care Education/Training Program

## 2018-10-11 ENCOUNTER — Emergency Department: Payer: Medicare HMO

## 2018-10-11 ENCOUNTER — Encounter: Payer: Self-pay | Admitting: Emergency Medicine

## 2018-10-11 DIAGNOSIS — Z853 Personal history of malignant neoplasm of breast: Secondary | ICD-10-CM | POA: Diagnosis not present

## 2018-10-11 DIAGNOSIS — R059 Cough, unspecified: Secondary | ICD-10-CM

## 2018-10-11 DIAGNOSIS — I10 Essential (primary) hypertension: Secondary | ICD-10-CM | POA: Insufficient documentation

## 2018-10-11 DIAGNOSIS — Z923 Personal history of irradiation: Secondary | ICD-10-CM | POA: Insufficient documentation

## 2018-10-11 DIAGNOSIS — Z9221 Personal history of antineoplastic chemotherapy: Secondary | ICD-10-CM | POA: Diagnosis not present

## 2018-10-11 DIAGNOSIS — Z79899 Other long term (current) drug therapy: Secondary | ICD-10-CM | POA: Insufficient documentation

## 2018-10-11 DIAGNOSIS — R05 Cough: Secondary | ICD-10-CM | POA: Diagnosis present

## 2018-10-11 DIAGNOSIS — Z91040 Latex allergy status: Secondary | ICD-10-CM | POA: Insufficient documentation

## 2018-10-11 LAB — CBC WITH DIFFERENTIAL/PLATELET
ABS IMMATURE GRANULOCYTES: 0.01 10*3/uL (ref 0.00–0.07)
Basophils Absolute: 0 10*3/uL (ref 0.0–0.1)
Basophils Relative: 0 %
Eosinophils Absolute: 0.1 10*3/uL (ref 0.0–0.5)
Eosinophils Relative: 2 %
HCT: 40.9 % (ref 36.0–46.0)
Hemoglobin: 13.3 g/dL (ref 12.0–15.0)
Immature Granulocytes: 0 %
Lymphocytes Relative: 26 %
Lymphs Abs: 1.2 10*3/uL (ref 0.7–4.0)
MCH: 31.7 pg (ref 26.0–34.0)
MCHC: 32.5 g/dL (ref 30.0–36.0)
MCV: 97.6 fL (ref 80.0–100.0)
Monocytes Absolute: 0.5 10*3/uL (ref 0.1–1.0)
Monocytes Relative: 11 %
Neutro Abs: 2.8 10*3/uL (ref 1.7–7.7)
Neutrophils Relative %: 61 %
Platelets: 216 10*3/uL (ref 150–400)
RBC: 4.19 MIL/uL (ref 3.87–5.11)
RDW: 11.9 % (ref 11.5–15.5)
WBC: 4.6 10*3/uL (ref 4.0–10.5)
nRBC: 0 % (ref 0.0–0.2)

## 2018-10-11 LAB — BASIC METABOLIC PANEL
Anion gap: 9 (ref 5–15)
BUN: 11 mg/dL (ref 8–23)
CO2: 23 mmol/L (ref 22–32)
Calcium: 9 mg/dL (ref 8.9–10.3)
Chloride: 105 mmol/L (ref 98–111)
Creatinine, Ser: 0.69 mg/dL (ref 0.44–1.00)
GFR calc Af Amer: >60 mL/min (ref >60)
GFR calc non Af Amer: >60 mL/min (ref >60)
Glucose, Bld: 127 mg/dL — ABNORMAL HIGH (ref 70–99)
Potassium: 3.3 mmol/L — ABNORMAL LOW (ref 3.5–5.1)
Sodium: 137 mmol/L (ref 135–145)

## 2018-10-11 MED ORDER — ALBUTEROL SULFATE HFA 108 (90 BASE) MCG/ACT IN AERS: 2.0000 | INHALATION_SPRAY | Freq: Four times a day (QID) | RESPIRATORY_TRACT | 2 refills | Status: DC | PRN

## 2018-10-11 MED ORDER — IPRATROPIUM-ALBUTEROL 0.5-2.5 (3) MG/3ML IN SOLN
3.0000 mL | Freq: Once | RESPIRATORY_TRACT | Status: AC
  Administered 2018-10-11: 3 mL via RESPIRATORY_TRACT
  Filled 2018-10-11: qty 3

## 2018-10-11 MED ORDER — PREDNISONE 20 MG PO TABS: 40.0000 mg | ORAL_TABLET | Freq: Every day | ORAL | 0 refills | Status: AC

## 2018-10-11 NOTE — ED Provider Notes (Signed)
[facility] Emergency Department Provider Note    First MD Initiated Contact with Patient 10/11/18 1216     (approximate)  I have reviewed the triage vital signs and the nursing notes.   HISTORY  Chief Complaint Shortness of Breath and Cough    HPI Cynthia Walker is a 84 y.o. female below listed past medical history presents the ER for recurrent cough the past several weeks.  States that she will have these coughing fits that last roughly 45 minutes that the husband states reminds her of a "whooping cough ".  She denies any shortness of breath at this time.  Is not having any productive cough.  No fevers or chills.  Denies any nausea or vomiting.  No pleuritic pain.  No chest pain.  No lower extremity swelling.  Past Medical History:  Diagnosis Date  . Breast cancer [facility]) 2009   Right breast CA with lumpectomy, radiation and chemo tx's.  Marland Kitchen Hypertension   . Personal history of chemotherapy   . Personal history of radiation therapy    Family History  Problem Relation Age of Onset  . Cancer Brother        lung cancer  . Cancer Sister        not sure  . Heart disease Father   . Hypertension Father   . Breast cancer Neg Hx    Past Surgical History:  Procedure Laterality Date  . BREAST BIOPSY Right 03/15/2008   invasive mammary carcinoma  . BREAST LUMPECTOMY Right 04/14/2008   Pathology revealed a 1.1 cm grade I invasive ductal carcinoma. Margins were negative.   . COLONOSCOPY WITH PROPOFOL N/A 07/10/2015   Procedure: COLONOSCOPY WITH PROPOFOL;  Surgeon: Hulen Luster, MD;  Location: [facility] ENDOSCOPY;  Service: Gastroenterology;  Laterality: N/A;  . TONSILLECTOMY     age of 84 years old   Patient Active Problem List   Diagnosis Date Noted  . Syncope 09/27/2017  . Atypical chest pain 09/03/2017  . Essential hypertension 09/03/2017  . Short-term memory loss 09/03/2017  . Osteoporosis 04/02/2016  . Breast cancer, right ([place]) 04/06/2008       Prior to Admission medications   Medication Sig Start Date End Date Taking? Authorizing Provider  lisinopril (PRINIVIL,ZESTRIL) 10 MG tablet Take 10 mg by mouth daily. 12/11/17   [provider]  Multiple Vitamin (MULTI-VITAMINS) TABS Take by mouth.    [provider]    Allergies Flu virus vaccine; Isoflavones; Latex; Other; Procaine; and Penicillins    Social History Social History   Tobacco Use  . Smoking status: Never Smoker  . Smokeless tobacco: Never Used  Substance Use Topics  . Alcohol use: No  . Drug use: No    Review of Systems Patient denies headaches, rhinorrhea, blurry vision, numbness, shortness of breath, chest pain, edema, cough, abdominal pain, nausea, vomiting, diarrhea, dysuria, fevers, rashes or hallucinations unless otherwise stated above in HPI. ____________________________________________   PHYSICAL EXAM:  VITAL SIGNS: Vitals:   10/11/18 1132  BP: (!) 143/76  Pulse: 94  Resp: 16  Temp: (!) 97.5 F (36.4 C)  SpO2: 97%    Constitutional: Alert and oriented. Well appearing and in no acute distress. Eyes: Conjunctivae are normal.  Head: Atraumatic. Nose: No congestion/rhinnorhea. Mouth/Throat: Mucous membranes are moist.   Neck: Painless ROM.  Cardiovascular:   Good peripheral circulation. Respiratory: Normal respiratory effort.  No retractions.  Gastrointestinal: Soft and nontender.  Musculoskeletal: No lower extremity tenderness .  No joint effusions.  Neurologic:  Normal speech and language. No gross focal neurologic deficits are appreciated.  Skin:  Skin is warm, dry and intact. No rash noted. Psychiatric: Mood and affect are normal. Speech and behavior are normal.  ____________________________________________   LABS (all labs ordered are listed, but only abnormal results are displayed)  Results for orders placed or performed during the hospital encounter of 10/11/18 (from the past 24 hour(s))  CBC with  Differential     Status: None   Collection Time: 10/11/18 11:46 AM  Result Value Ref Range   WBC 4.6 4.0 - 10.5 K/uL   RBC 4.19 3.87 - 5.11 MIL/uL   Hemoglobin 13.3 12.0 - 15.0 g/dL   HCT 40.9 36.0 - 46.0 %   MCV 97.6 80.0 - 100.0 fL   MCH 31.7 26.0 - 34.0 pg   MCHC 32.5 30.0 - 36.0 g/dL   RDW 11.9 11.5 - 15.5 %   Platelets 216 150 - 400 K/uL   nRBC 0.0 0.0 - 0.2 %   Neutrophils Relative % 61 %   Neutro Abs 2.8 1.7 - 7.7 K/uL   Lymphocytes Relative 26 %   Lymphs Abs 1.2 0.7 - 4.0 K/uL   Monocytes Relative 11 %   Monocytes Absolute 0.5 0.1 - 1.0 K/uL   Eosinophils Relative 2 %   Eosinophils Absolute 0.1 0.0 - 0.5 K/uL   Basophils Relative 0 %   Basophils Absolute 0.0 0.0 - 0.1 K/uL   Immature Granulocytes 0 %   Abs Immature Granulocytes 0.01 0.00 - 0.07 K/uL  Basic metabolic panel     Status: Abnormal   Collection Time: 10/11/18 11:46 AM  Result Value Ref Range   Sodium 137 135 - 145 mmol/L   Potassium 3.3 (L) 3.5 - 5.1 mmol/L   Chloride 105 98 - 111 mmol/L   CO2 23 22 - 32 mmol/L   Glucose, Bld 127 (H) 70 - 99 mg/dL   BUN 11 8 - 23 mg/dL   Creatinine, Ser 0.69 0.44 - 1.00 mg/dL   Calcium 9.0 8.9 - 10.3 mg/dL   GFR calc non Af Amer >60 >60 mL/min   GFR calc Af Amer >60 >60 mL/min   Anion gap 9 5 - 15   ____________________________________________  EKG My review and personal interpretation at Time: 11:36   Indication: cough  Rate: 90  Rhythm: sinus Axis: normal Other: normal intervla,s no stemi ____________________________________________  RADIOLOGY  I personally reviewed all radiographic images ordered to evaluate for the above acute complaints and reviewed radiology reports and findings.  These findings were personally discussed with the patient.  Please see medical record for radiology report.  ____________________________________________   PROCEDURES  Procedure(s) performed:  Procedures    Critical Care performed:  no ____________________________________________   INITIAL IMPRESSION / ASSESSMENT AND PLAN / ED COURSE  Pertinent labs & imaging results that were available during my care of the patient were reviewed by me and considered in my medical decision making (see chart for details).  DDX: Bronchitis, pneumonia, mass, COPD, allergy  Cynthia Walker is a 84 y.o. who presents to the ED with symptoms as described above.  Patient is well-appearing she is afebrile no hypoxia or respiratory distress.  Blood work is reassuring.  EKG is also reassuring.  Not consistent with ACS.  Not consistent with PE or heart failure.  Does have hyperexpanded lungs without any evidence of mass.  Do suspect some component of underlying bronchitis chronic cough.  Will trial course of inhaler as  well as low-dose steroid and give referral to pulmonology.      ____________________________________________   FINAL CLINICAL IMPRESSION(S) / ED DIAGNOSES  Final diagnoses:  Cough      NEW MEDICATIONS STARTED DURING THIS VISIT:  New Prescriptions   No medications on file     Note:  This document was prepared using Dragon voice recognition software and may include unintentional dictation errors.     Merlyn Lot, MD 10/11/18 1252

## 2018-10-11 NOTE — ED Triage Notes (Signed)
Pt to ED via POV c/o cough, congestion, and shortness of breath. Pt seen her PCP recently and was given antibiotics and has been taking OTC cold medication but she had to stop taking it. Pt states that she has severe coughing spells. Pt is currently in no distress and is able to talk in complete sentences.

## 2019-02-08 ENCOUNTER — Emergency Department: Payer: Medicare HMO

## 2019-02-08 ENCOUNTER — Encounter: Payer: Self-pay | Admitting: Emergency Medicine

## 2019-02-08 ENCOUNTER — Emergency Department
Admission: EM | Admit: 2019-02-08 | Discharge: 2019-02-09 | Disposition: A | Discharge status: Home / Self Care | Payer: Medicare HMO | Attending: Emergency Medicine | Admitting: Emergency Medicine

## 2019-02-08 ENCOUNTER — Other Ambulatory Visit: Payer: Self-pay

## 2019-02-08 DIAGNOSIS — Z853 Personal history of malignant neoplasm of breast: Secondary | ICD-10-CM | POA: Diagnosis not present

## 2019-02-08 DIAGNOSIS — S299XXA Unspecified injury of thorax, initial encounter: Secondary | ICD-10-CM | POA: Diagnosis present

## 2019-02-08 DIAGNOSIS — S2242XA Multiple fractures of ribs, left side, initial encounter for closed fracture: Secondary | ICD-10-CM | POA: Diagnosis not present

## 2019-02-08 DIAGNOSIS — Y92009 Unspecified place in unspecified non-institutional (private) residence as the place of occurrence of the external cause: Secondary | ICD-10-CM | POA: Diagnosis not present

## 2019-02-08 DIAGNOSIS — W19XXXA Unspecified fall, initial encounter: Secondary | ICD-10-CM

## 2019-02-08 DIAGNOSIS — F0390 Unspecified dementia without behavioral disturbance: Secondary | ICD-10-CM | POA: Insufficient documentation

## 2019-02-08 DIAGNOSIS — I10 Essential (primary) hypertension: Secondary | ICD-10-CM | POA: Diagnosis not present

## 2019-02-08 DIAGNOSIS — Z91040 Latex allergy status: Secondary | ICD-10-CM | POA: Insufficient documentation

## 2019-02-08 DIAGNOSIS — Y999 Unspecified external cause status: Secondary | ICD-10-CM | POA: Insufficient documentation

## 2019-02-08 DIAGNOSIS — W010XXA Fall on same level from slipping, tripping and stumbling without subsequent striking against object, initial encounter: Secondary | ICD-10-CM | POA: Insufficient documentation

## 2019-02-08 DIAGNOSIS — Z79899 Other long term (current) drug therapy: Secondary | ICD-10-CM | POA: Insufficient documentation

## 2019-02-08 DIAGNOSIS — Y939 Activity, unspecified: Secondary | ICD-10-CM | POA: Diagnosis not present

## 2019-02-08 LAB — URINALYSIS, COMPLETE (UACMP) WITH MICROSCOPIC
Bilirubin Urine: NEGATIVE
Glucose, UA: NEGATIVE mg/dL
Hgb urine dipstick: NEGATIVE
Ketones, ur: NEGATIVE mg/dL
Nitrite: NEGATIVE
Protein, ur: NEGATIVE mg/dL
Specific Gravity, Urine: 1.003 — ABNORMAL LOW (ref 1.005–1.030)
Squamous Epithelial / HPF: NONE SEEN (ref 0–5)
pH: 8 (ref 5.0–8.0)

## 2019-02-08 LAB — CBC WITH DIFFERENTIAL/PLATELET
Abs Immature Granulocytes: 0.04 10*3/uL (ref 0.00–0.07)
Basophils Absolute: 0 10*3/uL (ref 0.0–0.1)
Basophils Relative: 1 %
Eosinophils Absolute: 0.1 10*3/uL (ref 0.0–0.5)
Eosinophils Relative: 1 %
HCT: 46.5 % — ABNORMAL HIGH (ref 36.0–46.0)
Hemoglobin: 14.9 g/dL (ref 12.0–15.0)
Immature Granulocytes: 1 %
Lymphocytes Relative: 20 %
Lymphs Abs: 1.7 10*3/uL (ref 0.7–4.0)
MCH: 31.2 pg (ref 26.0–34.0)
MCHC: 32 g/dL (ref 30.0–36.0)
MCV: 97.3 fL (ref 80.0–100.0)
Monocytes Absolute: 0.7 10*3/uL (ref 0.1–1.0)
Monocytes Relative: 8 %
Neutro Abs: 5.8 10*3/uL (ref 1.7–7.7)
Neutrophils Relative %: 69 %
Platelets: 232 10*3/uL (ref 150–400)
RBC: 4.78 MIL/uL (ref 3.87–5.11)
RDW: 11.9 % (ref 11.5–15.5)
WBC: 8.4 10*3/uL (ref 4.0–10.5)
nRBC: 0 % (ref 0.0–0.2)

## 2019-02-08 LAB — BASIC METABOLIC PANEL
Anion gap: 13 (ref 5–15)
BUN: 14 mg/dL (ref 8–23)
CO2: 23 mmol/L (ref 22–32)
Calcium: 9.3 mg/dL (ref 8.9–10.3)
Chloride: 104 mmol/L (ref 98–111)
Creatinine, Ser: 0.77 mg/dL (ref 0.44–1.00)
GFR calc Af Amer: >60 mL/min (ref >60)
GFR calc non Af Amer: >60 mL/min (ref >60)
Glucose, Bld: 100 mg/dL — ABNORMAL HIGH (ref 70–99)
Potassium: 3.8 mmol/L (ref 3.5–5.1)
Sodium: 140 mmol/L (ref 135–145)

## 2019-02-08 MED ORDER — TRAMADOL HCL 50 MG PO TABS: 50.0000 mg | ORAL_TABLET | Freq: Two times a day (BID) | ORAL | 0 refills | Status: DC

## 2019-02-08 MED ORDER — TRAMADOL HCL 50 MG PO TABS
50.0000 mg | ORAL_TABLET | Freq: Once | ORAL | Status: AC
  Administered 2019-02-08: 50 mg via ORAL
  Filled 2019-02-08: qty 1

## 2019-02-08 NOTE — Discharge Instructions (Addendum)
You have been evaluated for your fall. Follow-up with your provider as needed. You have been found to have a normal CT scan of the head. You do have two rib fractures on the left side. Take OTC Tylenol for pain. You may take the Ultram, the prescription pain medicine as needed.

## 2019-02-08 NOTE — ED Notes (Signed)
Xray notified about pt status and that she is ready for Xray.

## 2019-02-08 NOTE — ED Triage Notes (Signed)
Patient reports falling down stairs at home. States there was no hand rail so when was not able to catch herself. Patient states she has pain in left ribs. Also complaining of pain and abrasion to left elbow. Patient denies hitting head or LOC.

## 2019-02-08 NOTE — ED Notes (Signed)
Patient returned from xray at this time.

## 2019-02-08 NOTE — ED Provider Notes (Signed)
[facility] Emergency Department Provider Note ____________________________________________  Time seen: 1939  I have reviewed the triage vital signs and the nursing notes.  HISTORY  Chief Complaint  Fall  History provided by husband, due to patient dementia and short term memory loss.   HPI Cynthia Walker is a 84 y.o. female presents to the ED accompanied by her husband. He provides the history for his wife. He reports she had a mechanical fall while coming out of the house. Her husband was already outside, when he noted the neighbors running toward the house. She had apparently fallen on the stoop, falling onto her left side. There was not reported LOC, head injury, nausea, or weakness. She has complained of left rib pain intermittently. She has a bandage on her left elbow, but when questioned, reports that her doctor had drawn blood earlier today. She is alert to person and place, but denies any pain or even a fall.   Past Medical History:  Diagnosis Date  . Breast cancer [facility]) 2009   Right breast CA with lumpectomy, radiation and chemo tx's.  Marland Kitchen Hypertension   . Personal history of chemotherapy   . Personal history of radiation therapy     Patient Active Problem List   Diagnosis Date Noted  . Syncope 09/27/2017  . Atypical chest pain 09/03/2017  . Essential hypertension 09/03/2017  . Short-term memory loss 09/03/2017  . Osteoporosis 04/02/2016  . Breast cancer, right ([place]) 04/06/2008    Past Surgical History:  Procedure Laterality Date  . BREAST BIOPSY Right 03/15/2008   invasive mammary carcinoma  . BREAST LUMPECTOMY Right 04/14/2008   Pathology revealed a 1.1 cm grade I invasive ductal carcinoma. Margins were negative.   . COLONOSCOPY WITH PROPOFOL N/A 07/10/2015   Procedure: COLONOSCOPY WITH PROPOFOL;  Surgeon: Hulen Luster, MD;  Location: [facility] ENDOSCOPY;  Service: Gastroenterology;  Laterality: N/A;  . TONSILLECTOMY     age of 84 years old     Prior to Admission medications   Medication Sig Start Date End Date Taking? Authorizing Provider  albuterol (PROVENTIL HFA;VENTOLIN HFA) 108 (90 Base) MCG/ACT inhaler Inhale 2 puffs into the lungs every 6 (six) hours as needed for wheezing or shortness of breath. 10/11/18   Merlyn Lot, MD  lisinopril (PRINIVIL,ZESTRIL) 10 MG tablet Take 10 mg by mouth daily. 12/11/17   [provider]  Multiple Vitamin (MULTI-VITAMINS) TABS Take by mouth.    [provider]  traMADol (ULTRAM) 50 MG tablet Take 1 tablet (50 mg total) by mouth 2 (two) times daily. 02/08/19   Advika Mclelland, Dannielle Karvonen, PA-C    Allergies Flu virus vaccine; Latex; Other; Procaine; Soy isoflavones; and Penicillins  Family History  Problem Relation Age of Onset  . Cancer Brother        lung cancer  . Cancer Sister        not sure  . Heart disease Father   . Hypertension Father   . Breast cancer Neg Hx     Social History Social History   Tobacco Use  . Smoking status: Never Smoker  . Smokeless tobacco: Never Used  Substance Use Topics  . Alcohol use: No  . Drug use: No    Review of Systems  Constitutional: Negative for fever. Eyes: Negative for visual changes. ENT: Negative for sore throat. Cardiovascular: Negative for chest pain. Respiratory: Negative for shortness of breath. Gastrointestinal: Negative for abdominal pain, vomiting and diarrhea.   Genitourinary: Negative for dysuria. Musculoskeletal: Negative for  back pain. Reports left rib pain  Skin: Negative for rash. Left elbow abrasion Neurological: Negative for headaches, focal weakness or numbness. ____________________________________________  PHYSICAL EXAM:  VITAL SIGNS: ED Triage Vitals  Enc Vitals Group     BP 02/08/19 1858 (!) 200/115     Pulse Rate 02/08/19 1858 72     Resp 02/08/19 1858 18     Temp 02/08/19 1858 97.9 F (36.6 C)     Temp Source 02/08/19 1858 Oral     SpO2 02/08/19 1858 100 %     Weight 02/08/19  1859 138 lb (62.6 kg)     Height 02/08/19 1859 5\' 4"  (1.626 m)     Head Circumference --      Peak Flow --      Pain Score 02/08/19 1859 6     Pain Loc --      Pain Edu? --      Excl. in [place]? --     Constitutional: Alert and oriented. Well appearing and in no distress. Alert to person, place.  Head: Normocephalic and atraumatic. No Battle's sign. Eyes: Conjunctivae are normal. Normal extraocular movements Mouth/Throat: Mucous membranes are moist. Neck: Supple. Normal ROM. No distracting midline tenderness.  Cardiovascular: Normal rate, regular rhythm. Normal distal pulses. Respiratory: Normal respiratory effort. No wheezes/rales/rhonchi. Reports tenderness to the left anterior ribs. No bruise, ecchymosis or abrasions noted.  Gastrointestinal: Soft and nontender. No distention. Musculoskeletal: left elbow with an abrasion to the posterior process. Nontender with normal range of motion in all extremities.  Neurologic:  Normal gait without ataxia. Normal speech and language. No gross focal neurologic deficits are appreciated. Skin:  Skin is warm, dry and intact. No rash noted. Psychiatric: Mood and affect are normal. Patient exhibits appropriate insight and judgment. ____________________________________________   LABS (pertinent positives/negatives) Labs Reviewed  URINALYSIS, COMPLETE (UACMP) WITH MICROSCOPIC - Abnormal; Notable for the following components:      Result Value   Color, Urine COLORLESS (*)    APPearance CLEAR (*)    Specific Gravity, Urine 1.003 (*)    Leukocytes,Ua TRACE (*)    Bacteria, UA RARE (*)    All other components within normal limits  BASIC METABOLIC PANEL - Abnormal; Notable for the following components:   Glucose, Bld 100 (*)    All other components within normal limits  CBC WITH DIFFERENTIAL/PLATELET - Abnormal; Notable for the following components:   HCT 46.5 (*)    All other components within normal limits   ____________________________________________   RADIOLOGY  CT Head w/o CM    Left Rib Detail w/ CXR  ____________________________________________  PROCEDURES  Procedures Ultram 50 mg PO ____________________________________________  INITIAL IMPRESSION / ASSESSMENT AND PLAN / ED COURSE  Cynthia Walker was evaluated in Emergency Department on 02/10/2019 for the symptoms described in the history of present illness. She was evaluated in the context of the global COVID-19 pandemic, which necessitated consideration that the patient might be at risk for infection with the SARS-CoV-2 virus that causes COVID-19. Institutional protocols and algorithms that pertain to the evaluation of patients at risk for COVID-19 are in a state of rapid change based on information released by regulatory bodies including the CDC and federal and state organizations. These policies and algorithms were followed during the patient's care in the ED.  Geriatric patient with early dementia and memory issues, presents for evaluation of injury related to a mechanical fall. She is pleasant but has continued to claim she has no pain, no injury, and  has not had a fall. Her exam is reassuring, her CT is negative, but her rib XR confirms two left rib fractures. She will be discharged to the care of her husband with a prescription for Ultram. I spoke to Mr. Wulf via phone, he understands the discharge instructions. She will follow-up with her PCP as needed.   ----------------------------------------- 11:03 PM on 02/08/2019 -----------------------------------------  Spoke to Mr. Noy via phone, he has been in the parking lot since her arrival. He has been unavailable by phone until now. He endorses a mechanical fall as she tried to come outside.   I reviewed the patient's prescription history over the last 12 months in the multi-state controlled substances database(s) that includes [place], [place], [place], [place],  [place], [place], [place], [place], [place], [place], [place], [place], [place], and [place].  Results were notable for non-contributory. ____________________________________________  FINAL CLINICAL IMPRESSION(S) / ED DIAGNOSES  Final diagnoses:  Fall in home, initial encounter  Closed fracture of multiple ribs of left side, initial encounter      Melvenia Needles, PA-C 02/10/19 2227    Harvest Dark, MD 02/13/19 [phone]

## 2019-02-08 NOTE — ED Notes (Signed)
Husband left cell number for contact while he waits outside [phone])

## 2019-02-09 NOTE — ED Notes (Signed)
Pt and her husband verbalized understanding of d/c instructions, RX, and f/u care. No further questions at this time. Pt assisted to exit via wheelchair.

## 2019-04-12 ENCOUNTER — Emergency Department: Payer: Medicare HMO

## 2019-04-12 ENCOUNTER — Other Ambulatory Visit: Payer: Self-pay

## 2019-04-12 ENCOUNTER — Emergency Department
Admission: EM | Admit: 2019-04-12 | Discharge: 2019-04-13 | Disposition: A | Discharge status: Home / Self Care | Payer: Medicare HMO | Attending: Emergency Medicine | Admitting: Emergency Medicine

## 2019-04-12 ENCOUNTER — Encounter: Payer: Self-pay | Admitting: Emergency Medicine

## 2019-04-12 ENCOUNTER — Ambulatory Visit
Admission: RE | Admit: 2019-04-12 | Discharge: 2019-04-12 | Disposition: A | Discharge status: Home / Self Care | Payer: Medicare HMO | Source: Ambulatory Visit | Attending: Urgent Care | Admitting: Urgent Care

## 2019-04-12 DIAGNOSIS — Z79899 Other long term (current) drug therapy: Secondary | ICD-10-CM | POA: Insufficient documentation

## 2019-04-12 DIAGNOSIS — Z853 Personal history of malignant neoplasm of breast: Secondary | ICD-10-CM | POA: Insufficient documentation

## 2019-04-12 DIAGNOSIS — W19XXXA Unspecified fall, initial encounter: Secondary | ICD-10-CM | POA: Insufficient documentation

## 2019-04-12 DIAGNOSIS — Y929 Unspecified place or not applicable: Secondary | ICD-10-CM | POA: Diagnosis not present

## 2019-04-12 DIAGNOSIS — S20211A Contusion of right front wall of thorax, initial encounter: Secondary | ICD-10-CM | POA: Diagnosis not present

## 2019-04-12 DIAGNOSIS — F0390 Unspecified dementia without behavioral disturbance: Secondary | ICD-10-CM | POA: Insufficient documentation

## 2019-04-12 DIAGNOSIS — Y9389 Activity, other specified: Secondary | ICD-10-CM | POA: Insufficient documentation

## 2019-04-12 DIAGNOSIS — Y998 Other external cause status: Secondary | ICD-10-CM | POA: Insufficient documentation

## 2019-04-12 DIAGNOSIS — I10 Essential (primary) hypertension: Secondary | ICD-10-CM | POA: Insufficient documentation

## 2019-04-12 DIAGNOSIS — S299XXA Unspecified injury of thorax, initial encounter: Secondary | ICD-10-CM | POA: Diagnosis present

## 2019-04-12 DIAGNOSIS — Z1231 Encounter for screening mammogram for malignant neoplasm of breast: Secondary | ICD-10-CM | POA: Insufficient documentation

## 2019-04-12 DIAGNOSIS — C50911 Malignant neoplasm of unspecified site of right female breast: Secondary | ICD-10-CM

## 2019-04-12 DIAGNOSIS — Z91040 Latex allergy status: Secondary | ICD-10-CM | POA: Insufficient documentation

## 2019-04-12 LAB — URINALYSIS, COMPLETE (UACMP) WITH MICROSCOPIC
Bacteria, UA: NONE SEEN
Bilirubin Urine: NEGATIVE
Glucose, UA: NEGATIVE mg/dL
Hgb urine dipstick: NEGATIVE
Ketones, ur: NEGATIVE mg/dL
Nitrite: NEGATIVE
Protein, ur: NEGATIVE mg/dL
Specific Gravity, Urine: 1.014 (ref 1.005–1.030)
pH: 7 (ref 5.0–8.0)

## 2019-04-12 MED ORDER — TRAMADOL HCL 50 MG PO TABS
100.0000 mg | ORAL_TABLET | Freq: Once | ORAL | Status: AC
  Administered 2019-04-12: 100 mg via ORAL
  Filled 2019-04-12: qty 2

## 2019-04-12 NOTE — ED Triage Notes (Signed)
PT arrives via ems from home where she lives with her husband. Pt poor historian due to HX of dementia. Husband witnessed fall & reports to ems pt "lost her balance." Pt did hit her head but no loc reported. Pt arrives with complaints of minimal pain to the right side of her ribcage.

## 2019-04-12 NOTE — ED Notes (Signed)
Patient transported to X-ray 

## 2019-04-12 NOTE — ED Provider Notes (Signed)
[facility] Emergency Department Provider Note  ____________________________________________   First MD Initiated Contact with Patient 04/12/19 2320     (approximate)  I have reviewed the triage vital signs and the nursing notes.   HISTORY  Chief Complaint Fall  Level 5 caveat:  history/ROS limited by chronic dementia  HPI Cynthia Walker is a 84 y.o. female with history of dementia and other medical issues as listed below who presents for evaluation of a fall.  Reportedly she had a witnessed fall earlier tonight when she lost her balance.  She complains of pain to the right side of her ribs just to the side of her breast.  She is not in any distress but reports a significant amount of pain with touching the area.  She does not have any pain in her head or her neck.  Her husband confirms that it was apparently a mechanical fall where she lost her balance and struck the side of her chest.  She does not have any injuries to her arms or her legs.  Nothing in particular makes the pain better except for not pushing on the area.         Past Medical History:  Diagnosis Date   Breast cancer [facility]) 2009   Right breast CA with lumpectomy, radiation and chemo tx's.   Hypertension    Personal history of chemotherapy    Personal history of radiation therapy     Patient Active Problem List   Diagnosis Date Noted   Syncope 09/27/2017   Atypical chest pain 09/03/2017   Essential hypertension 09/03/2017   Short-term memory loss 09/03/2017   Osteoporosis 04/02/2016   Breast cancer, right ([place]) 04/06/2008    Past Surgical History:  Procedure Laterality Date   BREAST BIOPSY Right 03/15/2008   invasive mammary carcinoma   BREAST LUMPECTOMY Right 04/14/2008   Pathology revealed a 1.1 cm grade I invasive ductal carcinoma. Margins were negative.    COLONOSCOPY WITH PROPOFOL N/A 07/10/2015   Procedure: COLONOSCOPY WITH PROPOFOL;  Surgeon: Hulen Luster,  MD;  Location: [facility] ENDOSCOPY;  Service: Gastroenterology;  Laterality: N/A;   TONSILLECTOMY     age of 84 years old    Prior to Admission medications   Medication Sig Start Date End Date Taking? Authorizing Provider  albuterol (PROVENTIL HFA;VENTOLIN HFA) 108 (90 Base) MCG/ACT inhaler Inhale 2 puffs into the lungs every 6 (six) hours as needed for wheezing or shortness of breath. 10/11/18   Merlyn Lot, MD  lisinopril (PRINIVIL,ZESTRIL) 10 MG tablet Take 10 mg by mouth daily. 12/11/17   [provider]  Multiple Vitamin (MULTI-VITAMINS) TABS Take by mouth.    [provider]  traMADol (ULTRAM) 50 MG tablet Take 1 tablet (50 mg total) by mouth every 6 (six) hours as needed for moderate pain or severe pain. 04/13/19   Hinda Kehr, MD    Allergies Flu virus vaccine, Latex, Other, Procaine, Soy isoflavones, and Penicillins  Family History  Problem Relation Age of Onset   Cancer Brother        lung cancer   Cancer Sister        not sure   Breast cancer Sister    Heart disease Father    Hypertension Father     Social History Social History   Tobacco Use   Smoking status: Never Smoker   Smokeless tobacco: Never Used  Substance Use Topics   Alcohol use: No   Drug use: No    Review  of Systems Constitutional: No fever/chills Eyes: No visual changes. ENT: No sore throat. Cardiovascular: Denies chest pain. Respiratory: Denies shortness of breath. Gastrointestinal: No abdominal pain.  No nausea, no vomiting.  No diarrhea.  No constipation. Genitourinary: Negative for dysuria. Musculoskeletal: Tenderness to palpation of the right side of the chest wall. Negative for neck pain.  Negative for back pain. Integumentary: Negative for rash. Neurological: Negative for headaches, focal weakness or numbness.   ____________________________________________   PHYSICAL EXAM:  VITAL SIGNS: ED Triage Vitals  Enc Vitals Group     BP 04/12/19 2211 (!) 162/90      Pulse Rate 04/12/19 2211 74     Resp 04/12/19 2211 16     Temp 04/12/19 2211 97.8 F (36.6 C)     Temp Source 04/12/19 2211 Oral     SpO2 04/12/19 2211 96 %     Weight 04/12/19 2212 59 kg (130 lb)     Height 04/12/19 2212 1.626 m (5\' 4" )     Head Circumference --      Peak Flow --      Pain Score 04/12/19 2211 2     Pain Loc --      Pain Edu? --      Excl. in [place]? --     Constitutional: Alert and oriented. Generally well appearing and in no acute distress. Eyes: Conjunctivae are normal.  Head: Atraumatic. Nose: No congestion/rhinnorhea. Mouth/Throat: Mucous membranes are moist. Neck: No stridor.  No meningeal signs.  No tenderness to palpation of the cervical spine. Cardiovascular: Normal rate, regular rhythm. Good peripheral circulation. Grossly normal heart sounds. Respiratory: Normal respiratory effort.  No retractions. No audible wheezing. Gastrointestinal: Soft and nontender. No distention.  Musculoskeletal: Highly reproducible tenderness to the right lateral chest wall just lateral to the right breast.  There is no evidence of contusion or hematoma or ecchymosis.  No lower extremity tenderness nor edema. No gross deformities of extremities.  She has full range of motion of her right shoulder with no reproducible pain or tenderness. Neurologic:  Normal speech and language. No gross focal neurologic deficits are appreciated.  Skin:  Skin is warm, dry and intact. No rash noted. Psychiatric: Mood and affect are normal although she is clearly confused and confabulating a history of little bit.  ____________________________________________   LABS (all labs ordered are listed, but only abnormal results are displayed)  Labs Reviewed  URINALYSIS, COMPLETE (UACMP) WITH MICROSCOPIC - Abnormal; Notable for the following components:      Result Value   Color, Urine YELLOW (*)    APPearance HAZY (*)    Leukocytes,Ua MODERATE (*)    All other components within normal limits    URINE CULTURE  CBG MONITORING, ED   ____________________________________________  EKG  ED ECG REPORT I, Hinda Kehr, the attending physician, personally viewed and interpreted this ECG.  Date: 04/12/2019 EKG Time: 22: 40 Rate: 76 Rhythm: normal sinus rhythm QRS Axis: normal Intervals: normal ST/T Wave abnormalities: normal Narrative Interpretation: no evidence of acute ischemia  ____________________________________________  RADIOLOGY I, Hinda Kehr, personally viewed and evaluated these images (plain radiographs) as part of my medical decision making, as well as reviewing the written report by the radiologist.  ED MD interpretation: No acute abnormalities identified on chest x-ray.  No acute traumatic injuries on chest CT but there is a new pulmonary nodule that needs further investigation as an outpatient as well as multiple stable nodules.  Official radiology report(s): Dg Chest 2 View  Result Date:  04/12/2019 CLINICAL DATA:  Witnessed fall. Right-sided pain. EXAM: CHEST - 2 VIEW COMPARISON:  Chest and left rib radiographs 02/08/2019 FINDINGS: The cardiomediastinal contours are normal. The lungs are clear. Pulmonary vasculature is normal. No consolidation, pleural effusion, or pneumothorax. Previous left rib fractures are not well visualized. Nodular density projecting over the right lung base is likely a nipple shadow. This is unchanged no acute osseous abnormalities are seen. IMPRESSION: No acute chest findings. Electronically Signed   By: Keith Rake M.D.   On: 04/12/2019 22:40   Ct Chest Wo Contrast  Result Date: 04/13/2019 CLINICAL DATA:  Right upper rib pain after mechanical fall today. EXAM: CT CHEST WITHOUT CONTRAST TECHNIQUE: Multidetector CT imaging of the chest was performed following the standard protocol without IV contrast. COMPARISON:  Chest radiograph earlier this day. Chest CT 03/16/2014 scattered pulmonary nodules that are unchanged from prior exam,  FINDINGS: Cardiovascular: Aortic atherosclerosis. No periaortic stranding. Heart is normal in size. Coronary artery calcifications. No pericardial effusion. Mediastinum/Nodes: No mediastinal hemorrhage. Small mediastinal nodes all subcentimeter short axis. No evidence of hilar adenopathy allowing for lack contrast. Esophagus is decompressed. Lungs/Pleura: No pneumothorax. No pulmonary contusion. Linear atelectasis or scarring in the left lower lobe. Multiple pulmonary nodules, majority are unchanged in size from prior exam. However a right lower lobe nodule abutting the diaphragm (image 108 series 3) has increased, currently 11 x 8 mm, previously 6 mm. Margins of this nodule are irregular slightly spiculated. No pleural fluid. Trachea and mainstem bronchi are patent. Upper Abdomen: Stable subcentimeter hypodensity in the dome of the liver. Stable cyst in the left lobe of the liver. Cortical scarring in the upper left kidney. No acute upper abdominal findings. Musculoskeletal: No evidence of acute rib fracture, particular attention to the right ribs. Remote posterior right tenth rib fracture is unchanged from prior exam. Stable nodular density in the lower right breast, likely post treatment related change. IMPRESSION: 1. No acute right rib fracture or evidence of acute traumatic injury to the thorax. 2. Pulmonary nodules, majority of which are stable, however an 11 x 8 mm right lower lobe pulmonary nodule has increased in size from 2015 and has irregular margins. Consider multi disciplinary thoracic referral for further workup. At a minimum, short interval follow-up CT recommended in 3 months. PET-CT or tissue sampling are alternative options. Aortic Atherosclerosis (ICD10-I70.0). Electronically Signed   By: Keith Rake M.D.   On: 04/13/2019 00:08   Mm 3d Screen Breast Bilateral  Result Date: 04/12/2019 CLINICAL DATA:  Screening. EXAM: DIGITAL SCREENING BILATERAL MAMMOGRAM WITH TOMO AND CAD COMPARISON:   Previous exam(s). ACR Breast Density Category b: There are scattered areas of fibroglandular density. FINDINGS: There are no findings suspicious for malignancy. Images were processed with CAD. IMPRESSION: No mammographic evidence of malignancy. A result letter of this screening mammogram will be mailed directly to the patient. RECOMMENDATION: Screening mammogram in one year. (Code:[id number]-01Y) BI-RADS CATEGORY  1: Negative. Electronically Signed   By: Lillia Mountain M.D.   On: 04/12/2019 15:03    ____________________________________________   PROCEDURES   Procedure(s) performed (including Critical Care):  Procedures   ____________________________________________   INITIAL IMPRESSION / MDM / [place] / ED COURSE  As part of my medical decision making, I reviewed the following data within the [place] History obtained from family, Nursing notes reviewed and incorporated, Labs reviewed , Old chart reviewed and Notes from prior ED visits        Differential diagnosis includes, but is  not limited to, mechanical fall, rib cage contusion, internal trauma secondary to the fall, rib fractures.  The patient is generally well-appearing and in no distress until someone pushes on the right side of her chest wall.  She has no acute abnormalities evident on her extremities and she has no tenderness to palpation of her cervical spine.  I see no obvious traumatic injury to her head and she has no tenderness to palpation of her scalp and the only report of pain is in the right side of her chest.  Her chest x-ray was unremarkable but I am ordering a CT scan without contrast for further evaluation of possible rib fractures.  She is in no distress with stable vital signs and I anticipate discharge with outpatient follow-up even if she does have rib fractures and what she has numerous fractures, but I doubt that is the case.  She has what may be a mild urinary tract infection so I am  ordering fosfomycin 3 g by mouth and have ordered a urine culture but this may simply be a matter of asymptomatic bacteriuria or pyuria in an elderly woman.  Clinical Course as of Apr 12 38  Tue Apr 13, 2019  0035 No evidence of any rib fractures.  The patient has some stable pulmonary nodules as well as a new nodule that needs follow-up.  The patient's husband is now at bedside.  I updated him about all of the results including giving a dose of fosfomycin 3 g by mouth in order to treat a possible urinary tract infection.  Urine culture has been ordered as well.  The patient's husband is comfortable with the plan to take her home and follow-up as an outpatient.  I put in an ambulatory outpatient referral to the pulmonary nodule clinic and informed him about the pulmonary nodule that needs follow-up which she will pursue either with the pulmonary nodule clinic or with her primary care doctor.  I gave my usual customary return precautions.  The patient is in no distress and is at her baseline mental status.  CT Chest Wo Contrast [CF]    Clinical Course User Index [CF] Hinda Kehr, MD     ____________________________________________  FINAL CLINICAL IMPRESSION(S) / ED DIAGNOSES  Final diagnoses:  Fall, initial encounter  Contusion of right chest wall, initial encounter     MEDICATIONS GIVEN DURING THIS VISIT:  Medications  fosfomycin (MONUROL) packet 3 g (has no administration in time range)  traMADol (ULTRAM) tablet 100 mg (100 mg Oral Given 04/12/19 2359)     ED Discharge Orders         Ordered    AMB  Referral to Pulmonary Nodule Clinic    Comments: scanned after fall for possible traumatic injury, identified multiple stable pulmonary nodules but also has a new irregular nodule that will need follow up   04/13/19 0033    traMADol (ULTRAM) 50 MG tablet  Every 6 hours PRN     04/13/19 0034          *Please note:  Cynthia Walker was evaluated in Emergency Department on  04/13/2019 for the symptoms described in the history of present illness. She was evaluated in the context of the global COVID-19 pandemic, which necessitated consideration that the patient might be at risk for infection with the SARS-CoV-2 virus that causes COVID-19. Institutional protocols and algorithms that pertain to the evaluation of patients at risk for COVID-19 are in a state of rapid change based on information released  by regulatory bodies including the CDC and federal and state organizations. These policies and algorithms were followed during the patient's care in the ED.  Some ED evaluations and interventions may be delayed as a result of limited staffing during the pandemic.*  Note:  This document was prepared using Dragon voice recognition software and may include unintentional dictation errors.   Hinda Kehr, MD 04/13/19 [phone]

## 2019-04-13 MED ORDER — FOSFOMYCIN TROMETHAMINE 3 G PO PACK
3.0000 g | PACK | Freq: Once | ORAL | Status: AC
  Administered 2019-04-13: 01:00:00 3 g via ORAL
  Filled 2019-04-13: qty 3

## 2019-04-13 MED ORDER — TRAMADOL HCL 50 MG PO TABS: 50.0000 mg | ORAL_TABLET | Freq: Four times a day (QID) | ORAL | 0 refills | Status: DC | PRN

## 2019-04-13 NOTE — Discharge Instructions (Signed)
You have been seen in the Emergency Department (ED) today for a fall.  Your work up does not show any concerning injuries.  Please take over-the-counter ibuprofen and/or Tylenol as needed for your pain (unless you have an allergy or your doctor as told you not to take them), or take any prescribed medication as instructed.  Take Tramadol as prescribed for severe pain. Do not drink alcohol, drive or participate in any other potentially dangerous activities while taking this medication as it may make you sleepy. Do not take this medication with any other sedating medications, either prescription or over-the-counter. If you were prescribed Percocet or Vicodin, do not take these with acetaminophen (Tylenol) as it is already contained within these medications.   This medication is an opiate (or narcotic) pain medication and can be habit forming.  Use it as little as possible to achieve adequate pain control.  Do not use or use it with extreme caution if you have a history of opiate abuse or dependence.  If you are on a pain contract with your primary care doctor or a pain specialist, be sure to let them know you were prescribed this medication today from the [facility] Emergency Department.  This medication is intended for your use only - do not give any to anyone else and keep it in a secure place where nobody else, especially children, have access to it.  It will also cause or worsen constipation, so you may want to consider taking an over-the-counter stool softener while you are taking this medication.  Please follow up with your doctor regarding today's Emergency Department (ED) visit and your recent fall.    Return to the ED if you have any headache, confusion, slurred speech, weakness/numbness of any arm or leg, or any increased pain.

## 2019-04-14 ENCOUNTER — Other Ambulatory Visit: Payer: Self-pay | Admitting: Oncology

## 2019-04-14 LAB — URINE CULTURE
Culture: <10000 — AB
Special Requests: NORMAL

## 2019-04-14 NOTE — Progress Notes (Signed)
  Pulmonary Nodule Clinic Telephone Note  Received referral from PCP, Dr. Karma Greaser in the emergency room.   Patient was recently evaluated in the emergency room for a mechanical fall on 04/12/2019 where she lost her balance and fell striking her right side.  She complained of right side rib pain.  Had both chest x-ray and CT chest that revealed several pulmonary nodules that appeared stable but a new 11 x 8 mm right lower lobe pulmonary nodule with irregular margins that had increased in size from 2015.  It was recommended for her to have a thoracic referral, PET scan or tissue sampling.   She has past medical history of right breast cancer status post lumpectomy in July 2009.  Her last mammogram was from 04/11/2022 was read BI-RADS Category 1 negative.  Will reach out to patient's PCP Dr. Ellison Hughs.   High risk factors include: History of heavy smoking, exposure to asbestos, radium or uranium, personal family history of lung cancer, older age, sex (females greater than males), race (black and native [place] greater than weight), marginal speculation, upper lobe location, multiplicity (less than 5 nodules increases risk for malignancy) and emphysema and/or pulmonary fibrosis.   This recommendation follows the consensus statement: Guidelines for Management of Incidental Pulmonary Nodules Detected on CT Images: From the Fleischner Society 2017; Radiology 2017; 284:228-243.    I will await for response back from PCP Dr. Ellison Hughs to identify if he would like me to further work her up with possible additional imaging (PET SCAN) or to wait based on her current condition (Recent fall with injury) and extensive medical history.  Left VM on 04/14/19 for Dr. Ellison Hughs.   Faythe Casa, NP 11/18/2018 2:22 PM

## 2019-04-28 ENCOUNTER — Telehealth: Payer: Self-pay | Admitting: *Deleted

## 2019-04-28 ENCOUNTER — Other Ambulatory Visit: Payer: Self-pay | Admitting: Oncology

## 2019-04-28 DIAGNOSIS — R911 Solitary pulmonary nodule: Secondary | ICD-10-CM

## 2019-04-28 NOTE — Telephone Encounter (Signed)
Thanks so much Hayley.

## 2019-04-28 NOTE — Telephone Encounter (Signed)
Pt notified of appt for PET scan on Monday 7/27 at 1:30pm, arrival at 1pm. Prep instructions given to pt. Pt informed that if insurance authorization not received in time then PET scan will be rescheduled. Reassured pt that she will be notified if appts change. Instructed to call with any further questions or needs. Pt verbalized understanding.

## 2019-04-28 NOTE — Progress Notes (Signed)
Patient is followed by Dr. Mike Gip for history of breast cancer.  She was referred to the lung nodule clinic by the emergency room after CT scans revealed concerning lung nodule.  Spoke to Dr. Mike Gip who agreed to proceed with PET scan prior to her next appointment scheduled for 05/05/2019.  Orders placed for PET scan.  [facility], Lung Navigator notified who will get PET scan scheduled and call patient.   Faythe Casa, NP 04/28/2019 11:05 AM

## 2019-05-03 ENCOUNTER — Other Ambulatory Visit: Payer: Self-pay

## 2019-05-03 ENCOUNTER — Ambulatory Visit
Admission: RE | Admit: 2019-05-03 | Discharge: 2019-05-03 | Disposition: A | Discharge status: Home / Self Care | Payer: Medicare HMO | Source: Ambulatory Visit | Attending: Oncology | Admitting: Oncology

## 2019-05-03 DIAGNOSIS — R911 Solitary pulmonary nodule: Secondary | ICD-10-CM | POA: Diagnosis present

## 2019-05-03 LAB — GLUCOSE, CAPILLARY: Glucose-Capillary: 116 mg/dL — ABNORMAL HIGH (ref 70–99)

## 2019-05-03 MED ORDER — FLUDEOXYGLUCOSE F - 18 (FDG) INJECTION
7.0800 | Freq: Once | INTRAVENOUS | Status: AC | PRN
  Administered 2019-05-03: 13:00:00 7.08 via INTRAVENOUS

## 2019-05-03 NOTE — Progress Notes (Signed)
[facility]  [address] [place], [place] [address] Phone: [phone]  Fax: [phone]   Clinic Day:  05/05/2019  Referring physician: Sofie Hartigan, MD  Chief Complaint: Cynthia Walker is a 84 y.o. female with stage IA right breast cancer and a right lower lobe lung nodule who is seen for 1 year assessment.  HPI: The patient was last seen in the medical oncology clinic on 05/06/2018.   At that time, patient was doing well. She denied any acute concerns. Exam revealed breast tenderness.  She was noted to have osteoporosis with a T score of -3.4 in the right femur.  We discussed Prolia.  She was contacted several times on 06/09/2018 and 06/12/2018 about starting Prolia but was not reachable.   She was seen in the [facility] ER on 10/11/2018 for shortness of breath and cough. CXR showed hyperexpanded lungs without any evidence of mass. She was discharged with a low-dose steroid, inhaler, and to follow-up in pulmonology. She was seen again on 02/08/2019 in the ED after a fall. XR showed two left rib fractures. She was discharged with Ultram.   Bilateral screening mammogram on 04/12/2019 revealed no evidence of malignancy.   She was seen in the ER again on 04/12/2019 following a fall. Chest CT revealed no acute right rib fracture or evidence of acute traumatic injury to the thorax.  There were pulmonary nodules, the majority of which are stable, however 11 x 8 mm right lower lobe pulmonary nodule had increased in size from 2015 and had irregular margins.   PET scan on 05/03/2019 revealed 11 x 8 mm nodule along the anterolateral right lung base, slowly progressive from 2015, concerning for indolent primary bronchogenic neoplasm. There was mild hypermetabolism in the right hilar region, favored to be reactive. No findings suspicious for metastatic disease. There was hypermetabolism involving the right 4th, 7th, and 10th ribs. In the setting of trauma, this  appearance is suspicious for healing nondisplaced fractures.  During the interim, she feels "fine." She denies any fevers, sweats, or weight loss. She has a good appetite and is eating well. She denies any shortness of breath. She has pain and discomfort in her chest and abdomen after the fall earlier this month, worse when moving her arms or side to side.   She notes tenderness in her bilateral breasts. She does not perform monthly breast exams. She denies any bone or joint pain.   Her husband is her primary historian due to memory loss.    Past Medical History:  Diagnosis Date  . Breast cancer [facility]) 2009   Right breast CA with lumpectomy, radiation and chemo tx's.  Marland Kitchen Hypertension   . Personal history of chemotherapy   . Personal history of radiation therapy     Past Surgical History:  Procedure Laterality Date  . BREAST BIOPSY Right 03/15/2008   invasive mammary carcinoma  . BREAST LUMPECTOMY Right 04/14/2008   Pathology revealed a 1.1 cm grade I invasive ductal carcinoma. Margins were negative.   . COLONOSCOPY WITH PROPOFOL N/A 07/10/2015   Procedure: COLONOSCOPY WITH PROPOFOL;  Surgeon: Hulen Luster, MD;  Location: [facility] ENDOSCOPY;  Service: Gastroenterology;  Laterality: N/A;  . TONSILLECTOMY     age of 84 years old    Family History  Problem Relation Age of Onset  . Cancer Brother        lung cancer  . Cancer Sister        not sure  . Breast  cancer Sister   . Heart disease Father   . Hypertension Father     Social History:  reports that she has never smoked. She has never used smokeless tobacco. She reports that she does not drink alcohol or use drugs. She has never smoked and has not had significant exposure to secondhand smoke. Her husband's name is [name]. She lives in [place]. She is accompanied by her husband today.   Allergies:  Allergies  Allergen Reactions  . Flu Virus Vaccine     Other reaction(s): Other (See Comments) Stomach cramps  . Latex Swelling  . Other      Other reaction(s): Unknown Allergy to eggs  . Procaine Other (See Comments)    weakness  . Soy Isoflavones     Other reaction(s): Other (See Comments) Soy causes legs to feel like rubber  . Penicillins Rash    Has patient had a PCN reaction causing immediate rash, facial/tongue/throat swelling, SOB or lightheadedness with hypotension: No Has patient had a PCN reaction causing severe rash involving mucus membranes or skin necrosis: No Has patient had a PCN reaction that required hospitalization: No Has patient had a PCN reaction occurring within the last 10 years: No If all of the above answers are "NO", then may proceed with Cephalosporin use.     Current Medications: Current Outpatient Medications  Medication Sig Dispense Refill  . Multiple Vitamin (MULTI-VITAMINS) TABS Take by mouth.     No current facility-administered medications for this visit.     Review of Systems  Constitutional: Positive for weight loss (4lbs). Negative for diaphoresis, fever and malaise/fatigue.       Feels "fine."  HENT: Negative.  Negative for congestion, hearing loss, sinus pain and sore throat.   Eyes: Negative.  Negative for blurred vision.  Respiratory: Negative for cough, sputum production, shortness of breath and wheezing.   Cardiovascular: Negative for chest pain, palpitations, orthopnea, leg swelling and PND.  Gastrointestinal: Positive for abdominal pain (s/p fall, left sided). Negative for blood in stool, constipation, diarrhea, melena, nausea and vomiting.  Genitourinary: Negative for dysuria, frequency, hematuria and urgency.  Musculoskeletal: Positive for falls. Negative for back pain, joint pain and myalgias.       Tenderness in chest wall and bilateral breasts.   Skin: Negative for itching and rash.  Neurological: Negative for dizziness, tremors, weakness and headaches.  Endo/Heme/Allergies: Does not bruise/bleed easily.  Psychiatric/Behavioral: Positive for memory loss. Negative  for depression and suicidal ideas. The patient is not nervous/anxious and does not have insomnia.   All other systems reviewed and are negative.  Performance status (ECOG): 2-3  Vitals Blood pressure (!) 148/81, pulse 67, temperature (!) 97.2 F (36.2 C), temperature source Tympanic, resp. rate 18, weight 133 lb 6.1 oz (60.5 kg), SpO2 98 %.   Physical Exam  Constitutional: She is oriented to person, place, and time. She appears well-developed and well-nourished. No distress.  HENT:  Head: Normocephalic and atraumatic.  Mouth/Throat: Oropharynx is clear and moist. No oropharyngeal exudate.  Curly short gray hair. Mask.  Eyes: Pupils are equal, round, and reactive to light. Conjunctivae and EOM are normal. No scleral icterus.  Glasses.  Brown/hazel eyes   Neck: Normal range of motion. Neck supple.  Cardiovascular: Normal rate, regular rhythm and normal heart sounds.  No murmur heard. Pulmonary/Chest: Effort normal and breath sounds normal. No respiratory distress. She has no wheezes. She has no rales. Right breast exhibits skin change (Mild postoperative changes at the 7 o'clock position - stable)  and tenderness (severe). Right breast exhibits no inverted nipple, no mass and no nipple discharge. Left breast exhibits tenderness (severe). Left breast exhibits no inverted nipple, no mass, no nipple discharge and no skin change.  Chest wall tender s/p fall.  Abdominal: Soft. Bowel sounds are normal. She exhibits no distension and no mass. There is no abdominal tenderness. There is no rebound and no guarding.  Musculoskeletal: Normal range of motion.        General: No edema.  Lymphadenopathy:    She has no cervical adenopathy.    She has no axillary adenopathy.       Right: No supraclavicular adenopathy present.       Left: No supraclavicular adenopathy present.  Neurological: She is alert and oriented to person, place, and time.  Skin: Skin is warm and dry. No rash noted. She is not  diaphoretic. No erythema. No pallor.  Psychiatric: She has a normal mood and affect. Her behavior is normal. Judgment normal.  Nursing note and vitals reviewed.   Hospital Outpatient Visit on 05/03/2019  Component Date Value Ref Range Status  . Glucose-Capillary 05/03/2019 116* 70 - 99 mg/dL Final    Assessment:  Cynthia Walker is a 84 y.o. female with stage IA right breast cancer s/p lumpectomy and sentinel lymph node biopsy in 04/2008. Pathology revealed a 1.1 cm grade I invasive ductal carcinoma. Margins were negative. There was no lymphovascular invasion.  Stage was T1cN0M0.  Tumor was ER and PR positive and HER-2 negative (2+ by IHC but FISH -).  Oncotype DX testing revealed an intermediate score of 21.  She received adjuvant chemotherapy.  Chemotherapy was truncated secondary to an allergic reaction to Taxotere.  She received TC 1 followed by weekly Taxol and Cytoxan (no records available).  She received radiation. She received 5 years of Femara (10/26/2008 - 10/2013).  CA27.29 has been followed: 16.4 on 06/20/2011, 23.4 on 01/16/2012, 20.7 on 07/23/2012, 23 on 01/11/2013, 22.5 on 11/25/2013, 20.9 on 04/23/2017, 18.2 on 05/06/2018, and 21.2 on 05/05/2019.  Bilateral screening mammogram on 04/03/2017 revealed no evidence of malignancy.  Bilateral screening mammogram on 04/07/2018 revealed no evidence of malignancy.  Chest CT on 04/12/2019 revealed no acute right rib fracture or evidence of acute traumatic injury to the thorax.  There were pulmonary nodules, the majority of which are stable, however 11 x 8 mm right lower lobe pulmonary nodule had increased in size from 2015 and had irregular margins.   PET scan on 05/03/2019 revealed a 11 x 8 mm nodule along the anterolateral right lung base, slowly progressive from 2015, concerning for indolent primary bronchogenic neoplasm. There was mild hypermetabolism in the right hilar region, favored to be reactive. No findings suspicious for  metastatic disease. There was hypermetabolism involving the right 4th, 7th, and 10th ribs. In the setting of trauma, this appearance is suspicious for healing nondisplaced fractures.  Bone density study on 01/27/2014 revealed osteoporosis with a T score of -3.6 in the AP spine and -3.7 in the right femur. Bone density study on 04/02/2016 revealed a T score of -3.4 in the AP spine (L1-L4) and -3.2 in the right femur (improved).  Bone density study on 04/07/2018 revealed osteoporosis with a T score of -3.4 in the right femur. She stopped Fosamax in 2017.  Symptomatically, she has residual chest wall tenderness s/p fall.  Breast exam reveals no evidence of recurrence.  Plan: 1.   Labs today: CBC with diff, CMP, CA27.29. 2.   Stage IA right  breast cancer  Clinically, she appears to be doing well.  Patient is 11 years status post initial diagnosis.  She has no evidence of local recurrence.  CA27.29 is 21.2 (normal). 3.   Right lower lobe nodule  Etiology unclear but possibly secondary to an indolent primary lung cancer.  She has no history of smoking.  Location and size of lesion are problematic for biopsy.  She has no symptoms related to the nodule.  Discuss consideration of SBRT to the isolated lesion or possibly ongoing surveillance.  Present at tumor board on 05/06/2019. 4.   Osteoporosis  Briefly discussed.   Continue calcium and vitamin D.  Consider Prolia in the future. 5.   Consult radiation oncology. 6.   RTC after tumor board and radiation oncology consultation ([facility] or phone).  I discussed the assessment and treatment plan with the patient.  The patient was provided an opportunity to ask questions and all were answered.  The patient agreed with the plan and demonstrated an understanding of the instructions.  The patient was advised to call back if the symptoms worsen or if the condition fails to improve as anticipated.  I provided 34 minutes of face-to-face time during this this  encounter and > 50% was spent counseling as documented under my assessment and plan.    Lequita Asal, MD, PhD    05/05/2019, 3:02 PM  I, Molly Dorshimer, am acting as Education administrator for [employer]. Mike Gip, MD, PhD.  I,  C. Mike Gip, MD, have reviewed the above documentation for accuracy and completeness, and I agree with the above.

## 2019-05-04 ENCOUNTER — Other Ambulatory Visit: Payer: Self-pay | Admitting: Hematology and Oncology

## 2019-05-04 DIAGNOSIS — R911 Solitary pulmonary nodule: Secondary | ICD-10-CM | POA: Insufficient documentation

## 2019-05-04 NOTE — Progress Notes (Signed)
PET Scan results

## 2019-05-05 ENCOUNTER — Other Ambulatory Visit: Payer: Self-pay

## 2019-05-05 ENCOUNTER — Inpatient Hospital Stay: Payer: Medicare HMO | Attending: Hematology and Oncology | Admitting: Hematology and Oncology

## 2019-05-05 ENCOUNTER — Encounter: Payer: Self-pay | Admitting: *Deleted

## 2019-05-05 ENCOUNTER — Inpatient Hospital Stay: Payer: Medicare HMO

## 2019-05-05 ENCOUNTER — Encounter: Payer: Self-pay | Admitting: Hematology and Oncology

## 2019-05-05 VITALS — BP 148/81 | HR 67 | Temp 97.2°F | Resp 18 | Wt 133.4 lb

## 2019-05-05 DIAGNOSIS — C50911 Malignant neoplasm of unspecified site of right female breast: Secondary | ICD-10-CM | POA: Diagnosis not present

## 2019-05-05 DIAGNOSIS — I10 Essential (primary) hypertension: Secondary | ICD-10-CM | POA: Insufficient documentation

## 2019-05-05 DIAGNOSIS — Z9221 Personal history of antineoplastic chemotherapy: Secondary | ICD-10-CM | POA: Insufficient documentation

## 2019-05-05 DIAGNOSIS — M810 Age-related osteoporosis without current pathological fracture: Secondary | ICD-10-CM | POA: Diagnosis not present

## 2019-05-05 DIAGNOSIS — R918 Other nonspecific abnormal finding of lung field: Secondary | ICD-10-CM | POA: Insufficient documentation

## 2019-05-05 DIAGNOSIS — R911 Solitary pulmonary nodule: Secondary | ICD-10-CM | POA: Diagnosis not present

## 2019-05-05 DIAGNOSIS — Z170 Estrogen receptor positive status [ER+]: Secondary | ICD-10-CM | POA: Insufficient documentation

## 2019-05-05 DIAGNOSIS — Z923 Personal history of irradiation: Secondary | ICD-10-CM | POA: Diagnosis not present

## 2019-05-05 LAB — COMPREHENSIVE METABOLIC PANEL
ALT: 13 U/L (ref 0–44)
AST: 18 U/L (ref 15–41)
Albumin: 4.6 g/dL (ref 3.5–5.0)
Alkaline Phosphatase: 95 U/L (ref 38–126)
Anion gap: 11 (ref 5–15)
BUN: 15 mg/dL (ref 8–23)
CO2: 24 mmol/L (ref 22–32)
Calcium: 9.1 mg/dL (ref 8.9–10.3)
Chloride: 102 mmol/L (ref 98–111)
Creatinine, Ser: 0.71 mg/dL (ref 0.44–1.00)
GFR calc Af Amer: >60 mL/min (ref >60)
GFR calc non Af Amer: >60 mL/min (ref >60)
Glucose, Bld: 97 mg/dL (ref 70–99)
Potassium: 3.8 mmol/L (ref 3.5–5.1)
Sodium: 137 mmol/L (ref 135–145)
Total Bilirubin: 0.3 mg/dL (ref 0.3–1.2)
Total Protein: 7.9 g/dL (ref 6.5–8.1)

## 2019-05-05 LAB — CBC WITH DIFFERENTIAL/PLATELET
Abs Immature Granulocytes: 0.02 10*3/uL (ref 0.00–0.07)
Basophils Absolute: 0 10*3/uL (ref 0.0–0.1)
Basophils Relative: 0 %
Eosinophils Absolute: 0.1 10*3/uL (ref 0.0–0.5)
Eosinophils Relative: 1 %
HCT: 43 % (ref 36.0–46.0)
Hemoglobin: 14.3 g/dL (ref 12.0–15.0)
Immature Granulocytes: 0 %
Lymphocytes Relative: 25 %
Lymphs Abs: 1.7 10*3/uL (ref 0.7–4.0)
MCH: 32.4 pg (ref 26.0–34.0)
MCHC: 33.3 g/dL (ref 30.0–36.0)
MCV: 97.3 fL (ref 80.0–100.0)
Monocytes Absolute: 0.5 10*3/uL (ref 0.1–1.0)
Monocytes Relative: 7 %
Neutro Abs: 4.7 10*3/uL (ref 1.7–7.7)
Neutrophils Relative %: 67 %
Platelets: 244 10*3/uL (ref 150–400)
RBC: 4.42 MIL/uL (ref 3.87–5.11)
RDW: 12.6 % (ref 11.5–15.5)
WBC: 7 10*3/uL (ref 4.0–10.5)
nRBC: 0 % (ref 0.0–0.2)

## 2019-05-05 NOTE — Progress Notes (Signed)
Pt here for follow up. Husband reports patient has been experiencing some soreness to right breast/ axilla area x 1 week. Denies any other concerns.

## 2019-05-06 ENCOUNTER — Other Ambulatory Visit: Payer: Medicare HMO

## 2019-05-06 LAB — CANCER ANTIGEN 27.29: CA 27.29: 21.2 U/mL (ref 0.0–38.6)

## 2019-05-06 NOTE — Progress Notes (Signed)
  Oncology Nurse Navigator Documentation  Navigator Location: [facility] (05/06/19 0900)   )Navigator Encounter Type: Follow-up Appt (05/06/19 0900)   Abnormal Finding Date: 04/13/19 (05/06/19 0900)                 Patient Visit Type: MedOnc (05/06/19 0900) Treatment Phase: Abnormal Scans (05/06/19 0900) Barriers/Navigation Needs: Coordination of Care (05/06/19 0900)   Interventions: Coordination of Care (05/06/19 0900)   Coordination of Care: Appts (05/06/19 0900)        Acuity: Level 1 (05/06/19 0900) Acuity Level 1: Initial guidance, education and coordination as needed;Minimal follow up required (05/06/19 0900)  Met with patient and her husband during follow up visit with Dr. Mike Gip. Pt had CT scan during recent ED visit which found incidental lung nodule. Pt had follow up visit with Dr. Mike Gip for breast cancer and to discuss PET scan results. All questions answered during visit. Reviewed upcoming appts with patient and her husband. Contact info given and instructed to call with any further questions or needs. Pt and husband verbalized understanding. Nothing further needed at this time.     Time Spent with Patient: 60 (05/06/19 0900)

## 2019-05-06 NOTE — Progress Notes (Signed)
Tumor Board Documentation  Cynthia Walker was presented by Dr Mike Gip at our Tumor Board on 05/06/2019, which included representatives from medical oncology, radiation oncology, pathology, radiology, surgical, navigation, internal medicine, pharmacy, genetics, pulmonology, palliative care, research.  Cynthia Walker currently presents as a new patient, for discussion with history of the following treatments: active survellience.  Additionally, we reviewed previous medical and familial history, history of present illness, and recent lab results along with all available histopathologic and imaging studies. The tumor board considered available treatment options and made the following recommendations: Radiation therapy (primary modality)    The following procedures/referrals were also placed: No orders of the defined types were placed in this encounter.   Clinical Trial Status: not discussed   Staging used:    AJCC Staging:       Group: Hisotry of Stage 1 A breast cancer now with PET positive Lung nodule   National site-specific guidelines   were discussed with respect to the case.  Tumor board is a meeting of clinicians from various specialty areas who evaluate and discuss patients for whom a multidisciplinary approach is being considered. Final determinations in the plan of care are those of the provider(s). The responsibility for follow up of recommendations given during tumor board is that of the provider.   Today's extended care, comprehensive team conference, Cynthia Walker was not present for the discussion and was not examined.   Multidisciplinary Tumor Board is a multidisciplinary case peer review process.  Decisions discussed in the Multidisciplinary Tumor Board reflect the opinions of the specialists present at the conference without having examined the patient.  Ultimately, treatment and diagnostic decisions rest with the primary provider(s) and the patient.

## 2019-05-12 ENCOUNTER — Other Ambulatory Visit: Payer: Self-pay

## 2019-05-13 ENCOUNTER — Ambulatory Visit
Admission: RE | Admit: 2019-05-13 | Discharge: 2019-05-13 | Disposition: A | Discharge status: Home / Self Care | Payer: Medicare HMO | Source: Ambulatory Visit | Attending: Radiation Oncology | Admitting: Radiation Oncology

## 2019-05-13 ENCOUNTER — Encounter: Payer: Self-pay | Admitting: Radiation Oncology

## 2019-05-13 ENCOUNTER — Other Ambulatory Visit: Payer: Self-pay

## 2019-05-13 ENCOUNTER — Encounter: Payer: Self-pay | Admitting: *Deleted

## 2019-05-13 VITALS — BP 132/84 | HR 86 | Temp 97.0°F | Wt 132.1 lb

## 2019-05-13 DIAGNOSIS — R918 Other nonspecific abnormal finding of lung field: Secondary | ICD-10-CM | POA: Insufficient documentation

## 2019-05-13 DIAGNOSIS — Z9221 Personal history of antineoplastic chemotherapy: Secondary | ICD-10-CM | POA: Diagnosis not present

## 2019-05-13 DIAGNOSIS — Z803 Family history of malignant neoplasm of breast: Secondary | ICD-10-CM | POA: Diagnosis not present

## 2019-05-13 DIAGNOSIS — I10 Essential (primary) hypertension: Secondary | ICD-10-CM | POA: Insufficient documentation

## 2019-05-13 DIAGNOSIS — Z809 Family history of malignant neoplasm, unspecified: Secondary | ICD-10-CM | POA: Diagnosis not present

## 2019-05-13 DIAGNOSIS — F0390 Unspecified dementia without behavioral disturbance: Secondary | ICD-10-CM | POA: Diagnosis not present

## 2019-05-13 DIAGNOSIS — R911 Solitary pulmonary nodule: Secondary | ICD-10-CM

## 2019-05-13 DIAGNOSIS — Z923 Personal history of irradiation: Secondary | ICD-10-CM | POA: Diagnosis not present

## 2019-05-13 DIAGNOSIS — R0602 Shortness of breath: Secondary | ICD-10-CM | POA: Diagnosis not present

## 2019-05-13 DIAGNOSIS — Z853 Personal history of malignant neoplasm of breast: Secondary | ICD-10-CM | POA: Insufficient documentation

## 2019-05-13 NOTE — Progress Notes (Signed)
  Oncology Nurse Navigator Documentation  Navigator Location: [facility] (05/13/19 1500)   )Navigator Encounter Type: Initial RadOnc (05/13/19 1500)                       Treatment Phase: Pre-Tx/Tx Discussion (05/13/19 1500) Barriers/Navigation Needs: No Barriers At This Time (05/13/19 1500)   Interventions: None Required (05/13/19 1500)           met with patient during initial rad-onc consultation with Dr. Baruch Gouty. All questions answered during visit. Reviewed upcoming appts with pt and her husband. Instructed to call with any further questions or needs. Pt and her husband verbalized understanding. Nothing further needed at this time.           Time Spent with Patient: 30 (05/13/19 1500)

## 2019-05-13 NOTE — Consult Note (Signed)
NEW PATIENT EVALUATION  Name: Cynthia Walker  MRN: [id number]  Date:   05/13/2019     DOB: 11-17-1934   This 84 y.o. female patient presents to the clinic for initial evaluation of presumed stage I non-small cell lung cancer of the right lower lobe  REFERRING PHYSICIAN: Sofie Hartigan, MD  CHIEF COMPLAINT:  Chief Complaint  Patient presents with  . Lung Cancer    Initial consultation    DIAGNOSIS: The encounter diagnosis was Right lower lobe pulmonary nodule.   PREVIOUS INVESTIGATIONS:  MRI scans and CT scans reviewed Clinical notes reviewed Case presented at weekly tumor conference  HPI: Patient is an 84 year old female with mild dementia who has presented with an enlarging right lower lobe nodule on CT scan.  Patient has a previous history of stage Ia right breast cancer.  She has had some episodes in the past of shortness of breath seeing in the emergency room and treated with low-dose steroid inhalers.  Her repeat mammogram showed no evidence of breast pathology.  She has been followed for multiple pulmonary nodules which have not increased in size since 2015.  However there is a nodule in the right lower lobe which is increased in size now measures 1.1 cm x 0.8 cm.  PET CT scan performed in July showed mild hyper metabolic activity in the right hilar region presumed to be reactive.  She also has had some hypermetabolic activity in the fourth seventh and 10th ribs although has a history recently of a fall.  There is hypermetabolic activity in the right lower lobe lesion.  Patient at this point is completely asymptomatic specifically denies cough hemoptysis or chest tightness.  Her husband is a former patient of mine and was involved in her treatment decisions.  PLANNED TREATMENT REGIMEN: SBRT  PAST MEDICAL HISTORY:  has a past medical history of Breast cancer ([place]) (2009), Hypertension, Personal history of chemotherapy, and Personal history of radiation therapy.    PAST  SURGICAL HISTORY:  Past Surgical History:  Procedure Laterality Date  . BREAST BIOPSY Right 03/15/2008   invasive mammary carcinoma  . BREAST LUMPECTOMY Right 04/14/2008   Pathology revealed a 1.1 cm grade I invasive ductal carcinoma. Margins were negative.   . COLONOSCOPY WITH PROPOFOL N/A 07/10/2015   Procedure: COLONOSCOPY WITH PROPOFOL;  Surgeon: Hulen Luster, MD;  Location: [facility] ENDOSCOPY;  Service: Gastroenterology;  Laterality: N/A;  . TONSILLECTOMY     age of 84 years old    FAMILY HISTORY: family history includes Breast cancer in her sister; Cancer in her brother and sister; Heart disease in her father; Hypertension in her father.  SOCIAL HISTORY:  reports that she has never smoked. She has never used smokeless tobacco. She reports that she does not drink alcohol or use drugs.  ALLERGIES: Flu virus vaccine, Latex, Other, Procaine, Soy isoflavones, and Penicillins  MEDICATIONS:  Current Outpatient Medications  Medication Sig Dispense Refill  . Multiple Vitamin (MULTI-VITAMINS) TABS Take by mouth.     No current facility-administered medications for this encounter.     ECOG PERFORMANCE STATUS:  0 - Asymptomatic  REVIEW OF SYSTEMS: Patient denies any weight loss, fatigue, weakness, fever, chills or night sweats. Patient denies any loss of vision, blurred vision. Patient denies any ringing  of the ears or hearing loss. No irregular heartbeat. Patient denies heart murmur or history of fainting. Patient denies any chest pain or pain radiating to her upper extremities. Patient denies any shortness of breath, difficulty breathing at night,  cough or hemoptysis. Patient denies any swelling in the lower legs. Patient denies any nausea vomiting, vomiting of blood, or coffee ground material in the vomitus. Patient denies any stomach pain. Patient states has had normal bowel movements no significant constipation or diarrhea. Patient denies any dysuria, hematuria or significant nocturia. Patient  denies any problems walking, swelling in the joints or loss of balance. Patient denies any skin changes, loss of hair or loss of weight. Patient denies any excessive worrying or anxiety or significant depression. Patient denies any problems with insomnia. Patient denies excessive thirst, polyuria, polydipsia. Patient denies any swollen glands, patient denies easy bruising or easy bleeding. Patient denies any recent infections, allergies or URI. Patient "s visual fields have not changed significantly in recent time.   PHYSICAL EXAM: BP 132/84 (BP Location: Left Arm, Patient Position: Sitting)   Pulse 86   Temp (!) 97 F (36.1 C) (Tympanic)   Wt 132 lb 1.6 oz (59.9 kg)   BMI 22.67 kg/m  Well-developed well-nourished patient in NAD. HEENT reveals PERLA, EOMI, discs not visualized.  Oral cavity is clear. No oral mucosal lesions are identified. Neck is clear without evidence of cervical or supraclavicular adenopathy. Lungs are clear to A&P. Cardiac examination is essentially unremarkable with regular rate and rhythm without murmur rub or thrill. Abdomen is benign with no organomegaly or masses noted. Motor sensory and DTR levels are equal and symmetric in the upper and lower extremities. Cranial nerves II through XII are grossly intact. Proprioception is intact. No peripheral adenopathy or edema is identified. No motor or sensory levels are noted. Crude visual fields are within normal range.  LABORATORY DATA: Labs reviewed    RADIOLOGY RESULTS: CT scans and PET CT scans reviewed compatible with above-stated findings   IMPRESSION: Hypermetabolic increasing nodule in the right lower lobe consistent with primary bronchogenic carcinoma stage I in 84 year old female.  PLAN: At this time pulmonary's opinion would be this would be a difficult to attempt to biopsy either by navigational bronchoscopy or CT-guided biopsy.  I am comfortable treating this based on the fact that is slowly growing nodule  hypermetabolic consistent with primary stage I lung cancer.  I have offered SBRT.  I would plan on delivering 6000 cGy in 5 fractions.  I would use motion restriction and 4-dimensional treatment planning during CT simulation.  Risks and benefits of treatment including possible development of cough fatigue and chance of lung necrosis all were discussed with the patient and her husband.  They both seem to comprehend my treatment plan well.  I have personally set up and ordered CT simulation for next week.  I would like to take this opportunity to thank you for allowing me to participate in the care of your patient.Noreene Filbert, MD

## 2019-05-16 NOTE — Progress Notes (Signed)
[facility]  [address], [address] [place], [place] [address] Phone: [phone]  Fax: [phone]   Clinic Day:  05/17/2019  Referring physician: Sofie Hartigan, MD  Chief Complaint: Cynthia Walker is a 84 y.o. female with stage IA right breast cancer and a right lower lobe lung nodule who is seenfor 2 week assessment.  HPI: The patient was last seen in the medical oncology clinic on 05/05/2019. At that time, she had residual chest wall tenderness s/p fall.  Breast exam revealed no evidence of recurrence.  CA27.29 was 21.2 (normal).  She was presented at Tumor Board on 05/06/2019.  Recommendation was for radiation therapy.    She was seen by Dr. Baruch Gouty in radiation oncology on 05/13/2019. Surgery was thought to be difficult. She was felt to have a slowly growing hypermetabolic nodule c/w a primary stage I lung cancer. SBRT was recommended with 6000 cGy in 5 fractions.   During the interim, she is doing well. She denies any concerns.   She is agreeable to undergo SBRT. She will follow-up with Dr. Baruch Gouty tomorrow for CT simulation and initiation of treatment on 05/31/2019.   Past Medical History:  Diagnosis Date  . Breast cancer [facility]) 2009   Right breast CA with lumpectomy, radiation and chemo tx's.  Marland Kitchen Hypertension   . Personal history of chemotherapy   . Personal history of radiation therapy     Past Surgical History:  Procedure Laterality Date  . BREAST BIOPSY Right 03/15/2008   invasive mammary carcinoma  . BREAST LUMPECTOMY Right 04/14/2008   Pathology revealed a 1.1 cm grade I invasive ductal carcinoma. Margins were negative.   . COLONOSCOPY WITH PROPOFOL N/A 07/10/2015   Procedure: COLONOSCOPY WITH PROPOFOL;  Surgeon: Hulen Luster, MD;  Location: [facility] ENDOSCOPY;  Service: Gastroenterology;  Laterality: N/A;  . TONSILLECTOMY     age of 84 years old    Family History  Problem Relation Age of Onset  . Cancer Brother        lung  cancer  . Cancer Sister        not sure  . Breast cancer Sister   . Heart disease Father   . Hypertension Father     Social History:  reports that she has never smoked. She has never used smokeless tobacco. She reports that she does not drink alcohol or use drugs. She has never smoked and has not had significant exposure to secondhand smoke. Her husband's name is [name]. She lives in [place]. She is accompanied by her husband, also a patient of mine, today.   Allergies:  Allergies  Allergen Reactions  . Flu Virus Vaccine     Other reaction(s): Other (See Comments) Stomach cramps  . Latex Swelling  . Other     Other reaction(s): Unknown Allergy to eggs  . Procaine Other (See Comments)    weakness  . Soy Isoflavones     Other reaction(s): Other (See Comments) Soy causes legs to feel like rubber  . Penicillins Rash    Has patient had a PCN reaction causing immediate rash, facial/tongue/throat swelling, SOB or lightheadedness with hypotension: No Has patient had a PCN reaction causing severe rash involving mucus membranes or skin necrosis: No Has patient had a PCN reaction that required hospitalization: No Has patient had a PCN reaction occurring within the last 10 years: No If all of the above answers are "NO", then may proceed with Cephalosporin use.     Current Medications: Current Outpatient  Medications  Medication Sig Dispense Refill  . Multiple Vitamin (MULTI-VITAMINS) TABS Take by mouth.     No current facility-administered medications for this visit.     Review of Systems  Constitutional: Negative for chills, diaphoresis, fever, malaise/fatigue and weight loss (stable).       Feels "fine."  HENT: Negative.  Negative for congestion, hearing loss, sinus pain and sore throat.   Eyes: Negative.  Negative for blurred vision.  Respiratory: Negative for cough, sputum production, shortness of breath and wheezing.   Cardiovascular: Negative for chest pain, palpitations,  orthopnea, leg swelling and PND.  Gastrointestinal: Positive for abdominal pain (s/p fall, left sided). Negative for blood in stool, constipation, diarrhea, melena, nausea and vomiting.  Genitourinary: Negative for dysuria, frequency, hematuria and urgency.  Musculoskeletal: Positive for falls. Negative for back pain, joint pain and myalgias.       Tenderness in chest wall and bilateral breasts.   Skin: Negative for itching and rash.  Neurological: Negative for dizziness, tremors, weakness and headaches.  Endo/Heme/Allergies: Does not bruise/bleed easily.  Psychiatric/Behavioral: Positive for memory loss. Negative for depression and suicidal ideas. The patient is not nervous/anxious and does not have insomnia.   All other systems reviewed and are negative.  Performance status (ECOG): 2-3  Vitals Blood pressure (!) 142/79, pulse 85, temperature (!) 96.3 F (35.7 C), resp. rate 18, height _0  (1.626 m), weight 133 lb 2.5 oz (60.4 kg), SpO2 100 %.   Physical Exam  Constitutional: She is oriented to person, place, and time. She appears well-developed and well-nourished. No distress.  HENT:  Head: Normocephalic and atraumatic.  Curly short gray hair. Mask.  Eyes: Conjunctivae and EOM are normal. No scleral icterus.  Glasses.  Brown/hazel eyes   Neurological: She is alert and oriented to person, place, and time.  Skin: She is not diaphoretic.  Psychiatric: She has a normal mood and affect. Her behavior is normal. Judgment normal.  Nursing note and vitals reviewed.   No visits with results within 3 Day(s) from this visit.  Latest known visit with results is:  Appointment on 05/05/2019  Component Date Value Ref Range Status  . CA 27.29 05/05/2019 21.2  0.0 - 38.6 U/mL Final   Comment: (NOTE) Siemens Centaur Immunochemiluminometric Methodology [facility]) Values obtained with different assay methods or kits cannot be used interchangeably. Results cannot be interpreted as absolute evidence  of the presence or absence of malignant disease. Performed At: [facility] [place], [place] [id number] Rush Farmer MD [id number]   . Sodium 05/05/2019 137  135 - 145 mmol/L Final  . Potassium 05/05/2019 3.8  3.5 - 5.1 mmol/L Final  . Chloride 05/05/2019 102  98 - 111 mmol/L Final  . CO2 05/05/2019 24  22 - 32 mmol/L Final  . Glucose, Bld 05/05/2019 97  70 - 99 mg/dL Final  . BUN 05/05/2019 15  8 - 23 mg/dL Final  . Creatinine, Ser 05/05/2019 0.71  0.44 - 1.00 mg/dL Final  . Calcium 05/05/2019 9.1  8.9 - 10.3 mg/dL Final  . Total Protein 05/05/2019 7.9  6.5 - 8.1 g/dL Final  . Albumin 05/05/2019 4.6  3.5 - 5.0 g/dL Final  . AST 05/05/2019 18  15 - 41 U/L Final  . ALT 05/05/2019 13  0 - 44 U/L Final  . Alkaline Phosphatase 05/05/2019 95  38 - 126 U/L Final  . Total Bilirubin 05/05/2019 0.3  0.3 - 1.2 mg/dL Final  . GFR calc non Af Amer 05/05/2019 >  60  >60 mL/min Final  . GFR calc Af Amer 05/05/2019 >60  >60 mL/min Final  . Anion gap 05/05/2019 11  5 - 15 Final   Performed at [facility], [address], [place], [place] [address]  . WBC 05/05/2019 7.0  4.0 - 10.5 K/uL Final  . RBC 05/05/2019 4.42  3.87 - 5.11 MIL/uL Final  . Hemoglobin 05/05/2019 14.3  12.0 - 15.0 g/dL Final  . HCT 05/05/2019 43.0  36.0 - 46.0 % Final  . MCV 05/05/2019 97.3  80.0 - 100.0 fL Final  . MCH 05/05/2019 32.4  26.0 - 34.0 pg Final  . MCHC 05/05/2019 33.3  30.0 - 36.0 g/dL Final  . RDW 05/05/2019 12.6  11.5 - 15.5 % Final  . Platelets 05/05/2019 244  150 - 400 K/uL Final  . nRBC 05/05/2019 0.0  0.0 - 0.2 % Final  . Neutrophils Relative % 05/05/2019 67  % Final  . Neutro Abs 05/05/2019 4.7  1.7 - 7.7 K/uL Final  . Lymphocytes Relative 05/05/2019 25  % Final  . Lymphs Abs 05/05/2019 1.7  0.7 - 4.0 K/uL Final  . Monocytes Relative 05/05/2019 7  % Final  . Monocytes Absolute 05/05/2019 0.5  0.1 - 1.0 K/uL Final  . Eosinophils Relative 05/05/2019 1  % Final  .  Eosinophils Absolute 05/05/2019 0.1  0.0 - 0.5 K/uL Final  . Basophils Relative 05/05/2019 0  % Final  . Basophils Absolute 05/05/2019 0.0  0.0 - 0.1 K/uL Final  . Immature Granulocytes 05/05/2019 0  % Final  . Abs Immature Granulocytes 05/05/2019 0.02  0.00 - 0.07 K/uL Final   Performed at [facility], [address], [place], [place] [address]    Assessment:  KAISLEE CHAO is a 84 y.o. female ith stage IA right breast cancers/p lumpectomy and sentinel lymph node biopsy in 04/2008. Pathology revealed a 1.1 cm grade I invasive ductal carcinoma. Margins were negative. There was no lymphovascular invasion. Stage was T1cN0M0. Tumor was ER and PR positive and HER-2 negative (2+ by IHC but FISH -). Oncotype DX testing revealed an intermediate score of 21.  She received adjuvant chemotherapy. Chemotherapy was truncated secondary to an allergic reaction to Taxotere. She received TC1 followed by weekly Taxol and Cytoxan(no records available). She received radiation. She received 5 years of Femara(10/26/2008 - 10/2013).  CA27.29 has been followed: 16.4 on 06/20/2011, 23.4 on 01/16/2012, 20.7 on 07/23/2012, 23 on 01/11/2013, 22.5 on 11/25/2013, 20.9 on 04/23/2017, 18.2 on 05/06/2018, and 21.2 on 05/05/2019.  Bilateral screening mammogramon 04/03/2017 revealed no evidence of malignancy. Bilateral screening mammogramon 04/07/2018 revealed no evidence of malignancy.  Chest CT on 04/12/2019 revealed no acute right rib fracture or evidence of acute traumatic injury to the thorax.  There were pulmonary nodules, the majority of which are stable, however 11 x 8 mm right lower lobe pulmonary nodule had increased in size from 2015 and had irregular margins.   PET scan on 05/03/2019 revealed a 11 x 8 mm nodule along the anterolateral right lung base, slowly progressive from 2015, concerning for indolent primary bronchogenic neoplasm. There was mild hypermetabolism in the right hilar  region, favored to be reactive. No findings suspicious for metastatic disease. There was hypermetabolism involving the right 4th, 7th, and 10th ribs. In the setting of trauma, this appearance is suspicious for healing nondisplaced fractures.  Bone density studyon 01/27/2014 revealed osteoporosiswith a T score of -3.6 in the AP spine and -3.7 in the right  femur. Bone density studyon 04/02/2016 revealed a T score of -3.4 in the AP spine (L1-L4) and -3.2 in the right femur (improved). Bone density study on 04/07/2018 revealed osteoporosis with a T score of -3.4 in the right femur.She stopped Fosamax in 2017.  Symptomatically, she denies any concerns.  Plan: 1.  Stage IA right breast cancer             Clinically, she is doing well.    She is 11 years s/p initial diagnosis.    Exam has revealed no evidence of recurrent disease.    CA27.29 was normal on 05/05/2019.    Continue surveillance . 2.   Right lower lobe nodule             Review tumor board discussions.  Review consult with Dr. Baruch Gouty.    Biopsy for the 1.1 cm right lung base lesion is felt problematic.  Etiology is felt secondary to a stage I indolent primary lung cancer.             She is agreeable to SBRT.  Radiation to begin on 05/31/2019. 3.   Osteoporosis             Continue calcium and vitamin D             Consider Prolia in the future. 4.   Patient to call 1st week of December for MD follow-up after 3 month chest CT s/p radiation (ordered by Dr Baruch Gouty).  I discussed the assessment and treatment plan with the patient.  The patient was provided an opportunity to ask questions and all were answered.  The patient agreed with the plan and demonstrated an understanding of the instructions.  The patient was advised to call back if the symptoms worsen or if the condition fails to improve as anticipated.   Lequita Asal, MD, PhD    05/17/2019, 3:20 PM  I, Molly Dorshimer, am acting as Education administrator for [employer]. Mike Gip,  MD, PhD.  I,  C. Mike Gip, MD, have reviewed the above documentation for accuracy and completeness, and I agree with the above.

## 2019-05-17 ENCOUNTER — Encounter: Payer: Self-pay | Admitting: Hematology and Oncology

## 2019-05-17 ENCOUNTER — Inpatient Hospital Stay: Payer: Medicare HMO | Attending: Hematology and Oncology | Admitting: Hematology and Oncology

## 2019-05-17 ENCOUNTER — Other Ambulatory Visit: Payer: Self-pay

## 2019-05-17 VITALS — BP 142/79 | HR 85 | Temp 96.3°F | Resp 18 | Ht 64.0 in | Wt 133.2 lb

## 2019-05-17 DIAGNOSIS — R911 Solitary pulmonary nodule: Secondary | ICD-10-CM

## 2019-05-17 DIAGNOSIS — I10 Essential (primary) hypertension: Secondary | ICD-10-CM | POA: Diagnosis not present

## 2019-05-17 DIAGNOSIS — Z923 Personal history of irradiation: Secondary | ICD-10-CM | POA: Diagnosis not present

## 2019-05-17 DIAGNOSIS — C50911 Malignant neoplasm of unspecified site of right female breast: Secondary | ICD-10-CM | POA: Diagnosis not present

## 2019-05-17 DIAGNOSIS — Z9221 Personal history of antineoplastic chemotherapy: Secondary | ICD-10-CM | POA: Diagnosis not present

## 2019-05-17 DIAGNOSIS — R918 Other nonspecific abnormal finding of lung field: Secondary | ICD-10-CM | POA: Insufficient documentation

## 2019-05-17 DIAGNOSIS — Z803 Family history of malignant neoplasm of breast: Secondary | ICD-10-CM | POA: Insufficient documentation

## 2019-05-17 DIAGNOSIS — Z853 Personal history of malignant neoplasm of breast: Secondary | ICD-10-CM | POA: Insufficient documentation

## 2019-05-17 DIAGNOSIS — M810 Age-related osteoporosis without current pathological fracture: Secondary | ICD-10-CM

## 2019-05-17 DIAGNOSIS — Z170 Estrogen receptor positive status [ER+]: Secondary | ICD-10-CM | POA: Diagnosis not present

## 2019-05-17 NOTE — Progress Notes (Signed)
No new changes noted today 

## 2019-05-18 ENCOUNTER — Other Ambulatory Visit: Payer: Self-pay

## 2019-05-18 ENCOUNTER — Ambulatory Visit
Admission: RE | Admit: 2019-05-18 | Discharge: 2019-05-18 | Disposition: A | Discharge status: Home / Self Care | Payer: Medicare HMO | Source: Ambulatory Visit | Attending: Radiation Oncology | Admitting: Radiation Oncology

## 2019-05-18 DIAGNOSIS — Z510 Encounter for antineoplastic radiation therapy: Secondary | ICD-10-CM | POA: Insufficient documentation

## 2019-05-18 DIAGNOSIS — R918 Other nonspecific abnormal finding of lung field: Secondary | ICD-10-CM | POA: Diagnosis not present

## 2019-05-19 DIAGNOSIS — Z510 Encounter for antineoplastic radiation therapy: Secondary | ICD-10-CM | POA: Diagnosis not present

## 2019-05-31 ENCOUNTER — Other Ambulatory Visit: Payer: Self-pay

## 2019-05-31 ENCOUNTER — Encounter: Payer: Self-pay | Admitting: *Deleted

## 2019-05-31 ENCOUNTER — Ambulatory Visit
Admission: RE | Admit: 2019-05-31 | Discharge: 2019-05-31 | Disposition: A | Discharge status: Home / Self Care | Payer: Medicare HMO | Source: Ambulatory Visit | Attending: Radiation Oncology | Admitting: Radiation Oncology

## 2019-05-31 DIAGNOSIS — Z510 Encounter for antineoplastic radiation therapy: Secondary | ICD-10-CM | POA: Diagnosis not present

## 2019-05-31 NOTE — Progress Notes (Signed)
  Oncology Nurse Navigator Documentation  Navigator Location: [facility] (05/31/19 1000)   )Navigator Encounter Type: Treatment (05/31/19 1000)                   Treatment Initiated Date: 05/31/19 (05/31/19 1000)   Treatment Phase: First Radiation Tx (05/31/19 1000) Barriers/Navigation Needs: No Barriers At This Time (05/31/19 1000)   Interventions: None Required (05/31/19 1000)                      Time Spent with Patient: 15 (05/31/19 1000)

## 2019-06-02 ENCOUNTER — Other Ambulatory Visit: Payer: Self-pay

## 2019-06-02 ENCOUNTER — Ambulatory Visit
Admission: RE | Admit: 2019-06-02 | Discharge: 2019-06-02 | Disposition: A | Discharge status: Home / Self Care | Payer: Medicare HMO | Source: Ambulatory Visit | Attending: Radiation Oncology | Admitting: Radiation Oncology

## 2019-06-02 DIAGNOSIS — Z510 Encounter for antineoplastic radiation therapy: Secondary | ICD-10-CM | POA: Diagnosis not present

## 2019-06-07 ENCOUNTER — Other Ambulatory Visit: Payer: Self-pay

## 2019-06-07 ENCOUNTER — Ambulatory Visit
Admission: RE | Admit: 2019-06-07 | Discharge: 2019-06-07 | Disposition: A | Discharge status: Home / Self Care | Payer: Medicare HMO | Source: Ambulatory Visit | Attending: Radiation Oncology | Admitting: Radiation Oncology

## 2019-06-07 DIAGNOSIS — Z510 Encounter for antineoplastic radiation therapy: Secondary | ICD-10-CM | POA: Diagnosis not present

## 2019-06-09 ENCOUNTER — Ambulatory Visit
Admission: RE | Admit: 2019-06-09 | Discharge: 2019-06-09 | Disposition: A | Discharge status: Home / Self Care | Payer: Medicare HMO | Source: Ambulatory Visit | Attending: Radiation Oncology | Admitting: Radiation Oncology

## 2019-06-09 ENCOUNTER — Other Ambulatory Visit: Payer: Self-pay

## 2019-06-09 DIAGNOSIS — Z510 Encounter for antineoplastic radiation therapy: Secondary | ICD-10-CM | POA: Insufficient documentation

## 2019-06-09 DIAGNOSIS — R918 Other nonspecific abnormal finding of lung field: Secondary | ICD-10-CM | POA: Diagnosis not present

## 2019-06-15 ENCOUNTER — Other Ambulatory Visit: Payer: Self-pay

## 2019-06-15 ENCOUNTER — Ambulatory Visit
Admission: RE | Admit: 2019-06-15 | Discharge: 2019-06-15 | Disposition: A | Discharge status: Home / Self Care | Payer: Medicare HMO | Source: Ambulatory Visit | Attending: Radiation Oncology | Admitting: Radiation Oncology

## 2019-06-15 DIAGNOSIS — Z510 Encounter for antineoplastic radiation therapy: Secondary | ICD-10-CM | POA: Diagnosis not present

## 2019-06-16 ENCOUNTER — Ambulatory Visit: Payer: Medicare HMO

## 2019-06-26 ENCOUNTER — Encounter: Payer: Self-pay | Admitting: *Deleted

## 2019-06-26 ENCOUNTER — Emergency Department: Payer: Medicare HMO

## 2019-06-26 ENCOUNTER — Other Ambulatory Visit: Payer: Self-pay

## 2019-06-26 ENCOUNTER — Inpatient Hospital Stay
Admission: EM | Admit: 2019-06-26 | Discharge: 2019-07-01 | DRG: 482 | Disposition: A | Discharge status: Skilled Nursing Facility | Payer: Medicare HMO | Attending: Specialist | Admitting: Specialist

## 2019-06-26 DIAGNOSIS — R413 Other amnesia: Secondary | ICD-10-CM | POA: Diagnosis present

## 2019-06-26 DIAGNOSIS — I10 Essential (primary) hypertension: Secondary | ICD-10-CM | POA: Diagnosis present

## 2019-06-26 DIAGNOSIS — Z853 Personal history of malignant neoplasm of breast: Secondary | ICD-10-CM

## 2019-06-26 DIAGNOSIS — D649 Anemia, unspecified: Secondary | ICD-10-CM | POA: Diagnosis not present

## 2019-06-26 DIAGNOSIS — Z880 Allergy status to penicillin: Secondary | ICD-10-CM

## 2019-06-26 DIAGNOSIS — D72829 Elevated white blood cell count, unspecified: Secondary | ICD-10-CM | POA: Diagnosis present

## 2019-06-26 DIAGNOSIS — Z887 Allergy status to serum and vaccine status: Secondary | ICD-10-CM | POA: Diagnosis not present

## 2019-06-26 DIAGNOSIS — Y92002 Bathroom of unspecified non-institutional (private) residence single-family (private) house as the place of occurrence of the external cause: Secondary | ICD-10-CM | POA: Diagnosis not present

## 2019-06-26 DIAGNOSIS — Z20828 Contact with and (suspected) exposure to other viral communicable diseases: Secondary | ICD-10-CM | POA: Diagnosis present

## 2019-06-26 DIAGNOSIS — Z9221 Personal history of antineoplastic chemotherapy: Secondary | ICD-10-CM | POA: Diagnosis not present

## 2019-06-26 DIAGNOSIS — S72142A Displaced intertrochanteric fracture of left femur, initial encounter for closed fracture: Secondary | ICD-10-CM | POA: Diagnosis present

## 2019-06-26 DIAGNOSIS — W1830XA Fall on same level, unspecified, initial encounter: Secondary | ICD-10-CM | POA: Diagnosis present

## 2019-06-26 DIAGNOSIS — S7292XA Unspecified fracture of left femur, initial encounter for closed fracture: Secondary | ICD-10-CM | POA: Diagnosis present

## 2019-06-26 DIAGNOSIS — Z923 Personal history of irradiation: Secondary | ICD-10-CM | POA: Diagnosis not present

## 2019-06-26 DIAGNOSIS — Z803 Family history of malignant neoplasm of breast: Secondary | ICD-10-CM

## 2019-06-26 DIAGNOSIS — S72009A Fracture of unspecified part of neck of unspecified femur, initial encounter for closed fracture: Secondary | ICD-10-CM | POA: Diagnosis present

## 2019-06-26 DIAGNOSIS — S72002A Fracture of unspecified part of neck of left femur, initial encounter for closed fracture: Secondary | ICD-10-CM

## 2019-06-26 DIAGNOSIS — Z8249 Family history of ischemic heart disease and other diseases of the circulatory system: Secondary | ICD-10-CM | POA: Diagnosis not present

## 2019-06-26 DIAGNOSIS — Z801 Family history of malignant neoplasm of trachea, bronchus and lung: Secondary | ICD-10-CM

## 2019-06-26 DIAGNOSIS — W19XXXA Unspecified fall, initial encounter: Secondary | ICD-10-CM

## 2019-06-26 LAB — COMPREHENSIVE METABOLIC PANEL
ALT: 15 U/L (ref 0–44)
AST: 26 U/L (ref 15–41)
Albumin: 3.9 g/dL (ref 3.5–5.0)
Alkaline Phosphatase: 75 U/L (ref 38–126)
Anion gap: 11 (ref 5–15)
BUN: 14 mg/dL (ref 8–23)
CO2: 24 mmol/L (ref 22–32)
Calcium: 9.1 mg/dL (ref 8.9–10.3)
Chloride: 104 mmol/L (ref 98–111)
Creatinine, Ser: 0.74 mg/dL (ref 0.44–1.00)
GFR calc Af Amer: >60 mL/min (ref >60)
GFR calc non Af Amer: >60 mL/min (ref >60)
Glucose, Bld: 143 mg/dL — ABNORMAL HIGH (ref 70–99)
Potassium: 3.8 mmol/L (ref 3.5–5.1)
Sodium: 139 mmol/L (ref 135–145)
Total Bilirubin: 0.4 mg/dL (ref 0.3–1.2)
Total Protein: 6.6 g/dL (ref 6.5–8.1)

## 2019-06-26 LAB — URINALYSIS, COMPLETE (UACMP) WITH MICROSCOPIC
Bilirubin Urine: NEGATIVE
Glucose, UA: NEGATIVE mg/dL
Ketones, ur: NEGATIVE mg/dL
Leukocytes,Ua: NEGATIVE
Nitrite: NEGATIVE
Protein, ur: NEGATIVE mg/dL
Specific Gravity, Urine: 1.013 (ref 1.005–1.030)
Squamous Epithelial / HPF: NONE SEEN (ref 0–5)
pH: 5 (ref 5.0–8.0)

## 2019-06-26 LAB — CBC WITH DIFFERENTIAL/PLATELET
Abs Immature Granulocytes: 0.07 10*3/uL (ref 0.00–0.07)
Basophils Absolute: 0 10*3/uL (ref 0.0–0.1)
Basophils Relative: 0 %
Eosinophils Absolute: 0 10*3/uL (ref 0.0–0.5)
Eosinophils Relative: 0 %
HCT: 39 % (ref 36.0–46.0)
Hemoglobin: 13.1 g/dL (ref 12.0–15.0)
Immature Granulocytes: 0 %
Lymphocytes Relative: 7 %
Lymphs Abs: 1.2 10*3/uL (ref 0.7–4.0)
MCH: 32.1 pg (ref 26.0–34.0)
MCHC: 33.6 g/dL (ref 30.0–36.0)
MCV: 95.6 fL (ref 80.0–100.0)
Monocytes Absolute: 0.9 10*3/uL (ref 0.1–1.0)
Monocytes Relative: 6 %
Neutro Abs: 14.1 10*3/uL — ABNORMAL HIGH (ref 1.7–7.7)
Neutrophils Relative %: 87 %
Platelets: 234 10*3/uL (ref 150–400)
RBC: 4.08 MIL/uL (ref 3.87–5.11)
RDW: 12 % (ref 11.5–15.5)
WBC: 16.3 10*3/uL — ABNORMAL HIGH (ref 4.0–10.5)
nRBC: 0 % (ref 0.0–0.2)

## 2019-06-26 LAB — CK: Total CK: 104 U/L (ref 38–234)

## 2019-06-26 LAB — SARS CORONAVIRUS 2 BY RT PCR (HOSPITAL ORDER, PERFORMED IN ~~LOC~~ HOSPITAL LAB): SARS Coronavirus 2: NEGATIVE

## 2019-06-26 MED ORDER — ACETAMINOPHEN 650 MG RE SUPP: 650.0000 mg | Freq: Four times a day (QID) | RECTAL | Status: DC | PRN

## 2019-06-26 MED ORDER — MORPHINE SULFATE (PF) 2 MG/ML IV SOLN
2.0000 mg | INTRAVENOUS | Status: DC | PRN
  Administered 2019-06-27 (×4): 2 mg via INTRAVENOUS
  Filled 2019-06-26 (×4): qty 1

## 2019-06-26 MED ORDER — ONDANSETRON HCL 4 MG PO TABS: 4.0000 mg | ORAL_TABLET | Freq: Four times a day (QID) | ORAL | Status: DC | PRN

## 2019-06-26 MED ORDER — SODIUM CHLORIDE 0.9 % IV SOLN
INTRAVENOUS | Status: AC
  Administered 2019-06-26: via INTRAVENOUS

## 2019-06-26 MED ORDER — FENTANYL CITRATE (PF) 100 MCG/2ML IJ SOLN
50.0000 ug | Freq: Once | INTRAMUSCULAR | Status: AC
  Administered 2019-06-26: 50 ug via INTRAVENOUS
  Filled 2019-06-26: qty 2

## 2019-06-26 MED ORDER — ONDANSETRON HCL 4 MG/2ML IJ SOLN: 4.0000 mg | Freq: Four times a day (QID) | INTRAMUSCULAR | Status: DC | PRN

## 2019-06-26 MED ORDER — OXYCODONE HCL 5 MG PO TABS: 5.0000 mg | ORAL_TABLET | ORAL | Status: DC | PRN

## 2019-06-26 MED ORDER — METHOCARBAMOL 1000 MG/10ML IJ SOLN
500.0000 mg | Freq: Three times a day (TID) | INTRAVENOUS | Status: DC | PRN
  Filled 2019-06-26: qty 5

## 2019-06-26 MED ORDER — ACETAMINOPHEN 325 MG PO TABS
650.0000 mg | ORAL_TABLET | Freq: Four times a day (QID) | ORAL | Status: DC | PRN
  Administered 2019-06-28 – 2019-07-01 (×6): 650 mg via ORAL
  Filled 2019-06-26 (×6): qty 2

## 2019-06-26 NOTE — ED Provider Notes (Signed)
[facility] Emergency Department Provider Note       Time seen: ----------------------------------------- 8:28 PM on 06/26/2019 -----------------------------------------   I have reviewed the triage vital signs and the nursing notes.  HISTORY   Chief Complaint Fall   HPI Cynthia Walker is a 84 y.o. female with a history of breast cancer, chemotherapy, radiation who presents to the ED for a fall that occurred at home.  Patient was found on the bathroom floor by her husband, husband states he was mowing for approximately 2 hours and does not know when she fell.  She complains of severe left hip pain and arrived lying on the left side.  Past Medical History:  Diagnosis Date  . Breast cancer [facility]) 2009   Right breast CA with lumpectomy, radiation and chemo tx's.  Marland Kitchen Hypertension   . Personal history of chemotherapy   . Personal history of radiation therapy     Patient Active Problem List   Diagnosis Date Noted  . Right lower lobe pulmonary nodule 05/04/2019  . Syncope 09/27/2017  . Atypical chest pain 09/03/2017  . Essential hypertension 09/03/2017  . Short-term memory loss 09/03/2017  . Osteoporosis 04/02/2016  . Breast cancer, right ([place]) 04/06/2008    Past Surgical History:  Procedure Laterality Date  . BREAST BIOPSY Right 03/15/2008   invasive mammary carcinoma  . BREAST LUMPECTOMY Right 04/14/2008   Pathology revealed a 1.1 cm grade I invasive ductal carcinoma. Margins were negative.   . COLONOSCOPY WITH PROPOFOL N/A 07/10/2015   Procedure: COLONOSCOPY WITH PROPOFOL;  Surgeon: Hulen Luster, MD;  Location: [facility] ENDOSCOPY;  Service: Gastroenterology;  Laterality: N/A;  . TONSILLECTOMY     age of 84 years old    Allergies Flu virus vaccine, Latex, Other, Procaine, Soy isoflavones, and Penicillins  Social History Social History   Tobacco Use  . Smoking status: Never Smoker  . Smokeless tobacco: Never Used  Substance Use Topics  .  Alcohol use: No  . Drug use: No    Review of Systems Constitutional: Negative for fever. Cardiovascular: Negative for chest pain. Respiratory: Negative for shortness of breath. Gastrointestinal: Negative for abdominal pain, vomiting and diarrhea. Musculoskeletal: Positive for left hip pain Skin: Negative for rash. Neurological: Negative for headaches, focal weakness or numbness.  All systems negative/normal/unremarkable except as stated in the HPI  ____________________________________________   PHYSICAL EXAM:  VITAL SIGNS: ED Triage Vitals  Enc Vitals Group     BP 06/26/19 2022 (!) 148/87     Pulse Rate 06/26/19 2022 83     Resp 06/26/19 2022 18     Temp 06/26/19 2022 97.6 F (36.4 C)     Temp Source 06/26/19 2022 Oral     SpO2 06/26/19 2019 98 %     Weight --      Height --      Head Circumference --      Peak Flow --      Pain Score --      Pain Loc --      Pain Edu? --      Excl. in [place]? --    Constitutional: Alert and oriented.  Mild to moderate distress from pain Eyes: Conjunctivae are normal. Normal extraocular movements. ENT      Head: Normocephalic and atraumatic.      Nose: No congestion/rhinnorhea.      Mouth/Throat: Mucous membranes are moist.      Neck: No stridor. Cardiovascular: Normal rate, regular rhythm. No murmurs, rubs, or  gallops. Respiratory: Normal respiratory effort without tachypnea nor retractions. Breath sounds are clear and equal bilaterally. No wheezes/rales/rhonchi. Gastrointestinal: Soft and nontender. Normal bowel sounds Musculoskeletal: Left lower extremity is shortened and externally rotated, severe pain with range of motion of left hip Neurologic:  Normal speech and language. No gross focal neurologic deficits are appreciated.  Skin:  Skin is warm, dry and intact. No rash noted. Psychiatric: Mood and affect are normal. Speech and behavior are normal.  ____________________________________________  EKG: Interpreted by me.  Sinus  rhythm with rate of 73 bpm, normal PR interval, normal QRS, normal QT, nonspecific ST segment changes  ____________________________________________  ED COURSE:  As part of my medical decision making, I reviewed the following data within the [place] History obtained from family if available, nursing notes, old chart and ekg, as well as notes from prior ED visits. Patient presented for fall with left hip pain, we will assess with labs and imaging as indicated at this time.   Procedures  Cynthia Walker was evaluated in Emergency Department on 06/26/2019 for the symptoms described in the history of present illness. She was evaluated in the context of the global COVID-19 pandemic, which necessitated consideration that the patient might be at risk for infection with the SARS-CoV-2 virus that causes COVID-19. Institutional protocols and algorithms that pertain to the evaluation of patients at risk for COVID-19 are in a state of rapid change based on information released by regulatory bodies including the CDC and federal and state organizations. These policies and algorithms were followed during the patient's care in the ED.  ____________________________________________   LABS (pertinent positives/negatives)  Labs Reviewed  CBC WITH DIFFERENTIAL/PLATELET - Abnormal; Notable for the following components:      Result Value   WBC 16.3 (*)    Neutro Abs 14.1 (*)    All other components within normal limits  COMPREHENSIVE METABOLIC PANEL - Abnormal; Notable for the following components:   Glucose, Bld 143 (*)    All other components within normal limits  URINALYSIS, COMPLETE (UACMP) WITH MICROSCOPIC - Abnormal; Notable for the following components:   Color, Urine YELLOW (*)    APPearance CLEAR (*)    Hgb urine dipstick SMALL (*)    Bacteria, UA RARE (*)    All other components within normal limits  SARS CORONAVIRUS 2 (HOSPITAL ORDER, [facility])  CK   CBG MONITORING, ED    RADIOLOGY Images were viewed by me  Left hip x-rays, chest x-ray Reveal comminuted left intertrochanteric hip fracture ____________________________________________   DIFFERENTIAL DIAGNOSIS   Left hip fracture, dislocation, rhabdomyolysis, dehydration, electrolyte abnormality, sepsis  FINAL ASSESSMENT AND PLAN  Fall, left hip fracture   Plan: The patient had presented for a fall that occurred at home. Patient's labs revealed leukocytosis but otherwise was unremarkable. Patient's imaging revealed a comminuted intertrochanteric left hip fracture.  I will discuss with orthopedics and the hospitalist for admission.   Laurence Aly, MD    Note: This note was generated in part or whole with voice recognition software. Voice recognition is usually quite accurate but there are transcription errors that can and very often do occur. I apologize for any typographical errors that were not detected and corrected.     Earleen Newport, MD 06/26/19 2116

## 2019-06-26 NOTE — ED Notes (Signed)
Waiting for Covid test to result before admission.

## 2019-06-26 NOTE — H&P (Addendum)
[place] [facility] [place] NAME: Cynthia Walker    MR#:  [id number]  DATE OF BIRTH:  July 19, 1935  DATE OF ADMISSION:  06/26/2019  PRIMARY CARE PHYSICIAN: Sofie Hartigan, MD   REQUESTING/REFERRING PHYSICIAN: Jimmye Norman, MD  CHIEF COMPLAINT:   Chief Complaint  Patient presents with  . Fall    HISTORY OF PRESENT ILLNESS:  Cynthia Walker  is a 84 y.o. female who presents with chief complaint as above.  Patient presents the ED after mechanical fall at home and subsequent left hip pain.  Imaging here shows left femur fracture.  Orthopedic surgery contacted by ED physician and they are on board and plan for operative repair tomorrow.  Hospitalist called for admission  PAST MEDICAL HISTORY:   Past Medical History:  Diagnosis Date  . Breast cancer [facility]) 2009   Right breast CA with lumpectomy, radiation and chemo tx's.  Marland Kitchen Hypertension   . Personal history of chemotherapy   . Personal history of radiation therapy      PAST SURGICAL HISTORY:   Past Surgical History:  Procedure Laterality Date  . BREAST BIOPSY Right 03/15/2008   invasive mammary carcinoma  . BREAST LUMPECTOMY Right 04/14/2008   Pathology revealed a 1.1 cm grade I invasive ductal carcinoma. Margins were negative.   . COLONOSCOPY WITH PROPOFOL N/A 07/10/2015   Procedure: COLONOSCOPY WITH PROPOFOL;  Surgeon: Hulen Luster, MD;  Location: [facility] ENDOSCOPY;  Service: Gastroenterology;  Laterality: N/A;  . TONSILLECTOMY     age of 84 years old     SOCIAL HISTORY:   Social History   Tobacco Use  . Smoking status: Never Smoker  . Smokeless tobacco: Never Used  Substance Use Topics  . Alcohol use: No     FAMILY HISTORY:   Family History  Problem Relation Age of Onset  . Cancer Brother        lung cancer  . Cancer Sister        not sure  . Breast cancer Sister   . Heart disease Father   . Hypertension Father      DRUG ALLERGIES:   Allergies  Allergen  Reactions  . Flu Virus Vaccine     Other reaction(s): Other (See Comments) Stomach cramps  . Latex Swelling  . Other     Other reaction(s): Unknown Allergy to eggs  . Procaine Other (See Comments)    weakness  . Soy Isoflavones     Other reaction(s): Other (See Comments) Soy causes legs to feel like rubber  . Penicillins Rash    Has patient had a PCN reaction causing immediate rash, facial/tongue/throat swelling, SOB or lightheadedness with hypotension: No Has patient had a PCN reaction causing severe rash involving mucus membranes or skin necrosis: No Has patient had a PCN reaction that required hospitalization: No Has patient had a PCN reaction occurring within the last 10 years: No If all of the above answers are "NO", then may proceed with Cephalosporin use.     MEDICATIONS AT HOME:   Prior to Admission medications   Medication Sig Start Date End Date Taking? Authorizing Provider  Multiple Vitamin (MULTI-VITAMINS) TABS Take by mouth.    [provider]    REVIEW OF SYSTEMS:  Review of Systems  Constitutional: Negative for chills, fever, malaise/fatigue and weight loss.  HENT: Negative for ear pain, hearing loss and tinnitus.   Eyes: Negative for blurred vision, double vision, pain and redness.  Respiratory: Negative for cough, hemoptysis  and shortness of breath.   Cardiovascular: Negative for chest pain, palpitations, orthopnea and leg swelling.  Gastrointestinal: Negative for abdominal pain, constipation, diarrhea, nausea and vomiting.  Genitourinary: Negative for dysuria, frequency and hematuria.  Musculoskeletal: Positive for joint pain (left hip). Negative for back pain and neck pain.  Skin:       No acne, rash, or lesions  Neurological: Negative for dizziness, tremors, focal weakness and weakness.  Endo/Heme/Allergies: Negative for polydipsia. Does not bruise/bleed easily.  Psychiatric/Behavioral: Negative for depression. The patient is not nervous/anxious  and does not have insomnia.      VITAL SIGNS:   Vitals:   06/26/19 2019 06/26/19 2022 06/26/19 2030  BP:  (!) 148/87 (!) 148/74  Pulse:  83 69  Resp:  18 (!) 28  Temp:  97.6 F (36.4 C)   TempSrc:  Oral   SpO2: 98% 98% 97%   Wt Readings from Last 3 Encounters:  05/17/19 60.4 kg  05/13/19 59.9 kg  05/05/19 60.5 kg    PHYSICAL EXAMINATION:  Physical Exam  Vitals reviewed. Constitutional: She is oriented to person, place, and time. She appears well-developed and well-nourished. No distress.  HENT:  Head: Normocephalic and atraumatic.  Mouth/Throat: Oropharynx is clear and moist.  Eyes: Pupils are equal, round, and reactive to light. Conjunctivae and EOM are normal. No scleral icterus.  Neck: Normal range of motion. Neck supple. No JVD present. No thyromegaly present.  Cardiovascular: Normal rate, regular rhythm and intact distal pulses. Exam reveals no gallop and no friction rub.  No murmur heard. Respiratory: Effort normal and breath sounds normal. No respiratory distress. She has no wheezes. She has no rales.  GI: Soft. Bowel sounds are normal. She exhibits no distension. There is no abdominal tenderness.  Musculoskeletal: Normal range of motion.        General: Tenderness (left hip) present. No edema.     Comments: No arthritis, no gout  Lymphadenopathy:    She has no cervical adenopathy.  Neurological: She is alert and oriented to person, place, and time. No cranial nerve deficit.  No dysarthria, no aphasia  Skin: Skin is warm and dry. No rash noted. No erythema.  Psychiatric: She has a normal mood and affect. Her behavior is normal. Judgment and thought content normal.    LABORATORY PANEL:   CBC Recent Labs  Lab 06/26/19 2038  WBC 16.3*  HGB 13.1  HCT 39.0  PLT 234   ------------------------------------------------------------------------------------------------------------------  Chemistries  Recent Labs  Lab 06/26/19 2038  NA 139  K 3.8  CL 104   CO2 24  GLUCOSE 143*  BUN 14  CREATININE 0.74  CALCIUM 9.1  AST 26  ALT 15  ALKPHOS 75  BILITOT 0.4   ------------------------------------------------------------------------------------------------------------------  Cardiac Enzymes No results for input(s): TROPONINI in the last 168 hours. ------------------------------------------------------------------------------------------------------------------  RADIOLOGY:  Dg Chest 1 View  Result Date: 06/26/2019 CLINICAL DATA:  Pain status post fall EXAM: CHEST  1 VIEW COMPARISON:  April 12, 2019 FINDINGS: The heart size and mediastinal contours are within normal limits. Both lungs are clear. The visualized skeletal structures are unremarkable. IMPRESSION: No active disease. Electronically Signed   By: Constance Holster M.D.   On: 06/26/2019 21:26   Dg Hip Unilat W Or W/o Pelvis 2-3 Views Left  Result Date: 06/26/2019 CLINICAL DATA:  Pain EXAM: DG HIP (WITH OR WITHOUT PELVIS) 2-3V LEFT COMPARISON:  None. FINDINGS: There is an acute displaced comminuted intratrochanteric fracture of the proximal left femur. There is no dislocation.  No additional acute displaced fracture identified on this exam. IMPRESSION: Acute displaced comminuted intratrochanteric fracture of the proximal left femur. Electronically Signed   By: Constance Holster M.D.   On: 06/26/2019 21:24    EKG:   Orders placed or performed during the hospital encounter of 06/26/19  . ED EKG  . EKG 12-Lead  . EKG 12-Lead  . ED EKG    IMPRESSION AND PLAN:  Principal Problem:   Femur fracture, left [facility]) -orthopedic surgery on board and plan for operative repair tomorrow.  Defer to their recommendations for continued management of this problem Active Problems:   Essential hypertension -patient is not currently on chronic medication for this.  We will monitor her blood pressure and treat as needed.  Goal is less than 160/100   Short-term memory loss -patient's husband is her  HCPOA.  He is with her at bedside.  Chart review performed and case discussed with ED provider. Labs, imaging and/or ECG reviewed by provider and discussed with patient/family. Management plans discussed with the patient and/or family.  COVID-19 status: Pending  DVT PROPHYLAXIS: Mechanical only  GI PROPHYLAXIS:  None  ADMISSION STATUS: Inpatient     CODE STATUS: Full Code Status History    Date Active Date Inactive Code Status Order ID Comments User Context   09/27/2017 1953 09/28/2017 1611 Full Code [id number]  Nicholes Mango, MD Inpatient   Advance Care Planning Activity      TOTAL TIME TAKING CARE OF THIS PATIENT: 45 minutes.   This patient was evaluated in the context of the global COVID-19 pandemic, which necessitated consideration that the patient might be at risk for infection with the SARS-CoV-2 virus that causes COVID-19. Institutional protocols and algorithms that pertain to the evaluation of patients at risk for COVID-19 are in a state of rapid change based on information released by regulatory bodies including the CDC and federal and state organizations. These policies and algorithms were followed to the best of this provider's knowledge to date during the patient's care at this facility.  Ethlyn Daniels 06/26/2019, 9:36 PM  [facility]  Office  [phone]  CC: Primary care physician; Sofie Hartigan, MD  Note:  This document was prepared using Dragon voice recognition software and may include unintentional dictation errors.

## 2019-06-26 NOTE — ED Triage Notes (Signed)
Per EMS report, patient was found on the bathroom floor by her husband. Husband states he was mowing for approximately two hours and doesn't know when she fell. Patient c/o left hip pain and arrived lying on left side.

## 2019-06-26 NOTE — ED Notes (Signed)
Husband is at bedside. Pink sleeve placed on right arm. Patient spoke with her daughter on the phone.

## 2019-06-26 NOTE — ED Notes (Signed)
Patient given warm blankets at this time.

## 2019-06-27 ENCOUNTER — Inpatient Hospital Stay: Payer: Medicare HMO

## 2019-06-27 ENCOUNTER — Encounter
Admission: EM | Disposition: A | Discharge status: Skilled Nursing Facility | Payer: Self-pay | Source: Home / Self Care | Attending: Specialist

## 2019-06-27 ENCOUNTER — Inpatient Hospital Stay: Payer: Medicare HMO | Admitting: Anesthesiology

## 2019-06-27 DIAGNOSIS — S72009A Fracture of unspecified part of neck of unspecified femur, initial encounter for closed fracture: Secondary | ICD-10-CM | POA: Diagnosis present

## 2019-06-27 HISTORY — PX: INTRAMEDULLARY (IM) NAIL INTERTROCHANTERIC: SHX5875

## 2019-06-27 LAB — BASIC METABOLIC PANEL
Anion gap: 8 (ref 5–15)
BUN: 16 mg/dL (ref 8–23)
CO2: 26 mmol/L (ref 22–32)
Calcium: 8.5 mg/dL — ABNORMAL LOW (ref 8.9–10.3)
Chloride: 105 mmol/L (ref 98–111)
Creatinine, Ser: 0.77 mg/dL (ref 0.44–1.00)
GFR calc Af Amer: >60 mL/min (ref >60)
GFR calc non Af Amer: >60 mL/min (ref >60)
Glucose, Bld: 132 mg/dL — ABNORMAL HIGH (ref 70–99)
Potassium: 3.9 mmol/L (ref 3.5–5.1)
Sodium: 139 mmol/L (ref 135–145)

## 2019-06-27 LAB — CBC
HCT: 33.3 % — ABNORMAL LOW (ref 36.0–46.0)
Hemoglobin: 11.1 g/dL — ABNORMAL LOW (ref 12.0–15.0)
MCH: 32 pg (ref 26.0–34.0)
MCHC: 33.3 g/dL (ref 30.0–36.0)
MCV: 96 fL (ref 80.0–100.0)
Platelets: 200 10*3/uL (ref 150–400)
RBC: 3.47 MIL/uL — ABNORMAL LOW (ref 3.87–5.11)
RDW: 12.1 % (ref 11.5–15.5)
WBC: 13.6 10*3/uL — ABNORMAL HIGH (ref 4.0–10.5)
nRBC: 0 % (ref 0.0–0.2)

## 2019-06-27 LAB — SURGICAL PCR SCREEN
MRSA, PCR: NEGATIVE
Staphylococcus aureus: NEGATIVE

## 2019-06-27 SURGERY — FIXATION, FRACTURE, INTERTROCHANTERIC, WITH INTRAMEDULLARY ROD
Anesthesia: Spinal | Site: Hip | Laterality: Left

## 2019-06-27 MED ORDER — FENTANYL CITRATE (PF) 100 MCG/2ML IJ SOLN
INTRAMUSCULAR | Status: DC | PRN
  Administered 2019-06-27: 50 ug via INTRAVENOUS

## 2019-06-27 MED ORDER — PROPOFOL 500 MG/50ML IV EMUL
INTRAVENOUS | Status: DC | PRN
  Administered 2019-06-27: 50 ug/kg/min via INTRAVENOUS

## 2019-06-27 MED ORDER — CELECOXIB 200 MG PO CAPS
200.0000 mg | ORAL_CAPSULE | Freq: Two times a day (BID) | ORAL | Status: DC
  Administered 2019-06-28 – 2019-07-01 (×7): 200 mg via ORAL
  Filled 2019-06-27 (×7): qty 1

## 2019-06-27 MED ORDER — VASOPRESSIN 20 UNIT/ML IV SOLN
INTRAVENOUS | Status: DC | PRN
  Administered 2019-06-27 (×4): 2 [IU] via INTRAVENOUS

## 2019-06-27 MED ORDER — LACTATED RINGERS IV SOLN
INTRAVENOUS | Status: DC | PRN
  Administered 2019-06-27: 19:00:00 via INTRAVENOUS

## 2019-06-27 MED ORDER — METOCLOPRAMIDE HCL 5 MG/ML IJ SOLN: 5.0000 mg | Freq: Three times a day (TID) | INTRAMUSCULAR | Status: DC | PRN

## 2019-06-27 MED ORDER — BUPIVACAINE LIPOSOME 1.3 % IJ SUSP
INTRAMUSCULAR | Status: AC
  Filled 2019-06-27: qty 20

## 2019-06-27 MED ORDER — METOCLOPRAMIDE HCL 10 MG PO TABS: 5.0000 mg | ORAL_TABLET | Freq: Three times a day (TID) | ORAL | Status: DC | PRN

## 2019-06-27 MED ORDER — ONDANSETRON HCL 4 MG/2ML IJ SOLN: 4.0000 mg | Freq: Once | INTRAMUSCULAR | Status: DC | PRN

## 2019-06-27 MED ORDER — BUPIVACAINE HCL (PF) 0.5 % IJ SOLN
INTRAMUSCULAR | Status: AC
  Filled 2019-06-27: qty 10

## 2019-06-27 MED ORDER — CEFAZOLIN SODIUM-DEXTROSE 2-4 GM/100ML-% IV SOLN
2.0000 g | Freq: Four times a day (QID) | INTRAVENOUS | Status: AC
  Administered 2019-06-27 – 2019-06-28 (×4): 2 g via INTRAVENOUS
  Filled 2019-06-27 (×4): qty 100

## 2019-06-27 MED ORDER — BUPIVACAINE HCL (PF) 0.5 % IJ SOLN
INTRAMUSCULAR | Status: AC
  Filled 2019-06-27: qty 30

## 2019-06-27 MED ORDER — SODIUM CHLORIDE 0.9 % NICU IV INFUSION SIMPLE
500.0000 mL | INJECTION | Freq: Once | INTRAVENOUS | Status: AC
  Administered 2019-06-27: 500 mL via INTRAVENOUS
  Filled 2019-06-27: qty 500

## 2019-06-27 MED ORDER — CEFAZOLIN SODIUM 1 G IJ SOLR
INTRAMUSCULAR | Status: AC
  Filled 2019-06-27: qty 20

## 2019-06-27 MED ORDER — BUPIVACAINE HCL (PF) 0.5 % IJ SOLN
INTRAMUSCULAR | Status: DC | PRN
  Administered 2019-06-27: 3 mL

## 2019-06-27 MED ORDER — MAGNESIUM HYDROXIDE 400 MG/5ML PO SUSP
30.0000 mL | Freq: Every day | ORAL | Status: DC | PRN
  Administered 2019-06-28 – 2019-06-30 (×2): 30 mL via ORAL
  Filled 2019-06-27 (×2): qty 30

## 2019-06-27 MED ORDER — PHENYLEPHRINE HCL (PRESSORS) 10 MG/ML IV SOLN
INTRAVENOUS | Status: DC | PRN
  Administered 2019-06-27: 200 ug via INTRAVENOUS
  Administered 2019-06-27: 100 ug via INTRAVENOUS
  Administered 2019-06-27: 150 ug via INTRAVENOUS
  Administered 2019-06-27: 50 ug via INTRAVENOUS
  Administered 2019-06-27: 100 ug via INTRAVENOUS

## 2019-06-27 MED ORDER — FLEET ENEMA 7-19 GM/118ML RE ENEM: 1.0000 | ENEMA | Freq: Once | RECTAL | Status: DC | PRN

## 2019-06-27 MED ORDER — FENTANYL CITRATE (PF) 100 MCG/2ML IJ SOLN
INTRAMUSCULAR | Status: AC
  Filled 2019-06-27: qty 2

## 2019-06-27 MED ORDER — GABAPENTIN 300 MG PO CAPS
300.0000 mg | ORAL_CAPSULE | Freq: Every day | ORAL | Status: DC
  Administered 2019-06-28 – 2019-06-30 (×3): 300 mg via ORAL
  Filled 2019-06-27 (×3): qty 1

## 2019-06-27 MED ORDER — PHENOL 1.4 % MT LIQD
1.0000 | OROMUCOSAL | Status: DC | PRN
  Filled 2019-06-27: qty 177

## 2019-06-27 MED ORDER — BISACODYL 10 MG RE SUPP: 10.0000 mg | Freq: Every day | RECTAL | Status: DC | PRN

## 2019-06-27 MED ORDER — CEFAZOLIN SODIUM-DEXTROSE 2-3 GM-%(50ML) IV SOLR
INTRAVENOUS | Status: DC | PRN
  Administered 2019-06-27: 2 g via INTRAVENOUS

## 2019-06-27 MED ORDER — NEOMYCIN-POLYMYXIN B GU 40-200000 IR SOLN
Status: AC
  Filled 2019-06-27: qty 4

## 2019-06-27 MED ORDER — SODIUM CHLORIDE 0.9 % IV SOLN
INTRAVENOUS | Status: DC
  Administered 2019-06-27 – 2019-06-29 (×3): via INTRAVENOUS

## 2019-06-27 MED ORDER — PROPOFOL 500 MG/50ML IV EMUL
INTRAVENOUS | Status: AC
  Filled 2019-06-27: qty 50

## 2019-06-27 MED ORDER — PROPOFOL 10 MG/ML IV BOLUS
INTRAVENOUS | Status: DC | PRN
  Administered 2019-06-27: 50 mg via INTRAVENOUS
  Administered 2019-06-27: 40 mg via INTRAVENOUS

## 2019-06-27 MED ORDER — OXYCODONE HCL 5 MG PO TABS
5.0000 mg | ORAL_TABLET | ORAL | Status: DC | PRN
  Administered 2019-06-28 – 2019-06-30 (×2): 5 mg via ORAL
  Filled 2019-06-27 (×3): qty 1

## 2019-06-27 MED ORDER — FENTANYL CITRATE (PF) 100 MCG/2ML IJ SOLN: 25.0000 ug | INTRAMUSCULAR | Status: DC | PRN

## 2019-06-27 MED ORDER — ASPIRIN EC 325 MG PO TBEC
325.0000 mg | DELAYED_RELEASE_TABLET | Freq: Every day | ORAL | Status: DC
  Administered 2019-06-28 – 2019-07-01 (×4): 325 mg via ORAL
  Filled 2019-06-27 (×4): qty 1

## 2019-06-27 MED ORDER — HALOPERIDOL LACTATE 5 MG/ML IJ SOLN
5.0000 mg | Freq: Once | INTRAMUSCULAR | Status: AC
  Administered 2019-06-27: 5 mg via INTRAVENOUS
  Filled 2019-06-27: qty 1

## 2019-06-27 MED ORDER — VASOPRESSIN 20 UNIT/ML IV SOLN
INTRAVENOUS | Status: AC
  Filled 2019-06-27: qty 1

## 2019-06-27 MED ORDER — SODIUM CHLORIDE 0.9 % IV SOLN
INTRAVENOUS | Status: DC | PRN
  Administered 2019-06-27: 19:00:00 via INTRAVENOUS

## 2019-06-27 MED ORDER — SODIUM CHLORIDE 0.9 % IR SOLN
Status: DC | PRN
  Administered 2019-06-27: 1000 mL

## 2019-06-27 MED ORDER — MENTHOL 3 MG MT LOZG
1.0000 | LOZENGE | OROMUCOSAL | Status: DC | PRN
  Filled 2019-06-27: qty 9

## 2019-06-27 SURGICAL SUPPLY — 49 items
APL PRP STRL LF DISP 70% ISPRP (MISCELLANEOUS) ×1
BLADE SURG 15 STRL LF DISP TIS (BLADE) ×1 IMPLANT
BLADE SURG 15 STRL SS (BLADE) ×3
BNDG COHESIVE 4X5 TAN STRL (GAUZE/BANDAGES/DRESSINGS) ×2 IMPLANT
BNDG COHESIVE 6X5 TAN STRL LF (GAUZE/BANDAGES/DRESSINGS) ×2 IMPLANT
CANISTER SUCT 1200ML W/VALVE (MISCELLANEOUS) ×3 IMPLANT
CHLORAPREP W/TINT 26 (MISCELLANEOUS) ×3 IMPLANT
COVER WAND RF STERILE (DRAPES) ×3 IMPLANT
DRAPE 3/4 80X56 (DRAPES) ×3 IMPLANT
DRAPE INCISE IOBAN 66X45 STRL (DRAPES) ×2 IMPLANT
DRAPE SURG 17X11 SM STRL (DRAPES) ×6 IMPLANT
DRAPE U-SHAPE 47X51 STRL (DRAPES) ×6 IMPLANT
DRSG OPSITE POSTOP 3X4 (GAUZE/BANDAGES/DRESSINGS) ×3 IMPLANT
ELECT REM PT RETURN 9FT ADLT (ELECTROSURGICAL) ×3
ELECTRODE REM PT RTRN 9FT ADLT (ELECTROSURGICAL) ×1 IMPLANT
GAUZE SPONGE 4X4 12PLY STRL (GAUZE/BANDAGES/DRESSINGS) ×2 IMPLANT
GLOVE BIOGEL PI IND STRL 8 (GLOVE) ×1 IMPLANT
GLOVE BIOGEL PI INDICATOR 8 (GLOVE)
GLOVE SURG SYN 7.5  E (GLOVE) ×8
GLOVE SURG SYN 7.5 E (GLOVE) ×4 IMPLANT
GLOVE SURG SYN 7.5 PF PI (GLOVE) ×1 IMPLANT
GLOVE SURG SYN 8.5  E (GLOVE) ×4
GLOVE SURG SYN 8.5 E (GLOVE) ×2 IMPLANT
GLOVE SURG SYN 8.5 PF PI (GLOVE) IMPLANT
GOWN STRL REUS W/ TWL LRG LVL3 (GOWN DISPOSABLE) ×1 IMPLANT
GOWN STRL REUS W/ TWL XL LVL3 (GOWN DISPOSABLE) ×1 IMPLANT
GOWN STRL REUS W/TWL LRG LVL3 (GOWN DISPOSABLE)
GOWN STRL REUS W/TWL XL LVL3 (GOWN DISPOSABLE) ×6
GUIDEROD T2 3X1000 (ROD) ×2 IMPLANT
KIT PATIENT CARE HANA TABLE (KITS) ×1 IMPLANT
KIT TURNOVER KIT A (KITS) ×3 IMPLANT
MAT ABSORB  FLUID 56X50 GRAY (MISCELLANEOUS) ×2
MAT ABSORB FLUID 56X50 GRAY (MISCELLANEOUS) ×2 IMPLANT
NAIL GAMMA LONG KIT RT 5 LEFT (Nail) ×2 IMPLANT
NDL FILTER BLUNT 18X1 1/2 (NEEDLE) ×1 IMPLANT
NEEDLE FILTER BLUNT 18X 1/2SAF (NEEDLE) ×2
NEEDLE FILTER BLUNT 18X1 1/2 (NEEDLE) ×1 IMPLANT
NEEDLE HYPO 22GX1.5 SAFETY (NEEDLE) ×3 IMPLANT
NS IRRIG 1000ML POUR BTL (IV SOLUTION) ×3 IMPLANT
PACK HIP COMPR (MISCELLANEOUS) ×3 IMPLANT
PENCIL ELECTRO HAND CTR (MISCELLANEOUS) ×3 IMPLANT
REAMER SHAFT BIXCUT (INSTRUMENTS) ×2 IMPLANT
SCREW LAG GAMMA 3 95MM (Screw) ×2 IMPLANT
SCREW LAG GAMMA 3 TI 10.5X105M (Screw) ×2 IMPLANT
STAPLER SKIN PROX 35W (STAPLE) ×3 IMPLANT
SUT VIC AB 2-0 CT2 27 (SUTURE) ×3 IMPLANT
SYR 10ML LL (SYRINGE) ×3 IMPLANT
SYR 30ML LL (SYRINGE) ×3 IMPLANT
TAPE CLOTH 3X10 WHT NS LF (GAUZE/BANDAGES/DRESSINGS) ×2 IMPLANT

## 2019-06-27 NOTE — Anesthesia Post-op Follow-up Note (Signed)
Anesthesia QCDR form completed.        

## 2019-06-27 NOTE — Progress Notes (Signed)
Pt very confused and agitated most of the night. Was finally able to get some rest after Haldol administration. Pt combative at times. Iv infusing without difficulty.

## 2019-06-27 NOTE — Progress Notes (Signed)
Confused and agitated trying to get out of bed. Trying to rip out iv and Foley catheter. Mittens applied.

## 2019-06-27 NOTE — Progress Notes (Signed)
[place] [facility] [place] NAME: Cynthia Walker    MR#:  [id number]  DATE OF BIRTH:  02-22-1935  SUBJECTIVE:   Patient presented to the hospital with a fall and noted to have a left hip fracture.  Wanting for surgery later today.  Patient is somewhat confused.  REVIEW OF SYSTEMS:    Review of Systems  Unable to perform ROS: Dementia    Nutrition: NPO for surgery today Tolerating Diet: No Tolerating PT: Await Eval.   DRUG ALLERGIES:   Allergies  Allergen Reactions  . Flu Virus Vaccine     Other reaction(s): Other (See Comments) Stomach cramps  . Latex Swelling  . Other     Other reaction(s): Unknown Allergy to eggs  . Procaine Other (See Comments)    weakness  . Soy Isoflavones     Other reaction(s): Other (See Comments) Soy causes legs to feel like rubber  . Penicillins Rash    Has patient had a PCN reaction causing immediate rash, facial/tongue/throat swelling, SOB or lightheadedness with hypotension: No Has patient had a PCN reaction causing severe rash involving mucus membranes or skin necrosis: No Has patient had a PCN reaction that required hospitalization: No Has patient had a PCN reaction occurring within the last 10 years: No If all of the above answers are "NO", then may proceed with Cephalosporin use.     VITALS:  Blood pressure (!) 148/86, pulse 92, temperature 98.4 F (36.9 C), temperature source Oral, resp. rate (!) 22, SpO2 99 %.  PHYSICAL EXAMINATION:   Physical Exam  GENERAL:  84 y.o.-year-old patient lying in bed in no acute distress.  EYES: Pupils equal, round, reactive to light and accommodation. No scleral icterus. Extraocular muscles intact.  HEENT: Head atraumatic, normocephalic. Oropharynx and nasopharynx clear.  NECK:  Supple, no jugular venous distention. No thyroid enlargement, no tenderness.  LUNGS: Normal breath sounds bilaterally, no wheezing, rales, rhonchi. No use of accessory muscles of  respiration.  CARDIOVASCULAR: S1, S2 normal. No murmurs, rubs, or gallops.  ABDOMEN: Soft, nontender, nondistended. Bowel sounds present. No organomegaly or mass.  EXTREMITIES: No cyanosis, clubbing or edema b/l.  Left lower extremity externally rotated and shortened. NEUROLOGIC: Cranial nerves II through XII are intact. No focal Motor or sensory deficits b/l.   PSYCHIATRIC: The patient is alert and oriented x 1.  SKIN: No obvious rash, lesion, or ulcer.    LABORATORY PANEL:   CBC Recent Labs  Lab 06/26/19 2038  WBC 16.3*  HGB 13.1  HCT 39.0  PLT 234   ------------------------------------------------------------------------------------------------------------------  Chemistries  Recent Labs  Lab 06/26/19 2038  NA 139  K 3.8  CL 104  CO2 24  GLUCOSE 143*  BUN 14  CREATININE 0.74  CALCIUM 9.1  AST 26  ALT 15  ALKPHOS 75  BILITOT 0.4   ------------------------------------------------------------------------------------------------------------------  Cardiac Enzymes No results for input(s): TROPONINI in the last 168 hours. ------------------------------------------------------------------------------------------------------------------  RADIOLOGY:  Dg Chest 1 View  Result Date: 06/26/2019 CLINICAL DATA:  Pain status post fall EXAM: CHEST  1 VIEW COMPARISON:  April 12, 2019 FINDINGS: The heart size and mediastinal contours are within normal limits. Both lungs are clear. The visualized skeletal structures are unremarkable. IMPRESSION: No active disease. Electronically Signed   By: Constance Holster M.D.   On: 06/26/2019 21:26   Dg Hip Unilat W Or W/o Pelvis 2-3 Views Left  Result Date: 06/26/2019 CLINICAL DATA:  Pain EXAM: DG HIP (WITH OR WITHOUT PELVIS)  2-3V LEFT COMPARISON:  None. FINDINGS: There is an acute displaced comminuted intratrochanteric fracture of the proximal left femur. There is no dislocation. No additional acute displaced fracture identified on this exam.  IMPRESSION: Acute displaced comminuted intratrochanteric fracture of the proximal left femur. Electronically Signed   By: Constance Holster M.D.   On: 06/26/2019 21:24     ASSESSMENT AND PLAN:   84 year old female with past medical history of breast cancer, hypertension, history of dementia presented to the hospital after mechanical fall and noted to have a left hip fracture.  1.  Status post fall and left hip fracture- patient noted to have a left intratrochanteric fracture of the femur. -Orthopedics following.  Plan for surgery later today. - No contraindication to surgery at this time.  2.  Leukocytosis- likely stress mediated from the fall. -No acute infectious source.  3.  History of breast cancer- currently in remission.  4.  Essential hypertension- patient is not on any chronic blood pressure medications at home. -Follow BP for now as some of it is probably pain related.  If needed will start some as needed IV hydralazine.     All the records are reviewed and case discussed with Care Management/Social Worker. Management plans discussed with the patient, family and they are in agreement.  CODE STATUS: Full code  DVT Prophylaxis: Lovenox  TOTAL TIME TAKING CARE OF THIS PATIENT: 30 minutes.   POSSIBLE D/C IN 2-3 DAYS, DEPENDING ON CLINICAL CONDITION.   Cynthia Walker M.D on 06/27/2019 at 11:50 AM  Between 7am to 6pm - Pager - [phone]  After 6pm go to [email or web address] - Proofreader  [facility]  Office  [phone]  CC: Primary care physician; Sofie Hartigan, MD

## 2019-06-27 NOTE — Progress Notes (Signed)
Patient's husband stated he gave patient a miniature reeses cup around 1030am. OR called and notified. Surgery to be delayed today or tomorrow.

## 2019-06-27 NOTE — Op Note (Signed)
OPERATIVE NOTE  DATE OF SURGERY:  06/26/2019 - 06/27/2019  PATIENT NAME:  Cynthia Walker   DOB: [birth year]  MRN: [id number]  PRE-OPERATIVE DIAGNOSIS: Left intertrochanteric femur fracture  POST-OPERATIVE DIAGNOSIS:  Same  PROCEDURE: Intramedullary Nailing Left intertrochanteric femur fracture   SURGEON:  Creig Hines, M.D.  ANESTHESIA: GETA  ESTIMATED BLOOD LOSS: 50 mL  FLUIDS REPLACED: 1000 mL of crystalloid  DRAINS: None  IMPLANTS UTILIZED: 1- [employer]ryker Gamma 3 nail 11x 360                                     2. Lag screw  INDICATIONS FOR SURGERY: Cynthia Walker is a 84 y.o. year old female who fell and sustained a displaced left intertrochanteric femur fracture. After discussion of the risks and benefits of surgical intervention, the patient expressed understanding of the risks benefits and agree with plans for open reduction and internal fixation.   The risks, benefits, and alternatives were discussed at length including but not limited to the risks of infection, bleeding, nerve injury, stiffness, blood clots, the need for revision surgery, limb length inequality, cardiopulmonary complications, among others, and they were willing to proceed.  PROCEDURE IN DETAIL: The patient was brought into the operating room and, after adequate general endotracheal anesthesia was achieved, patient was placed on the fracture table. All bony prominences were well-padded. The left lower extremity was placed in traction and a provisional reduction was performed and verified using the C-arm. The patient's left hip and leg were cleaned and prepped with alcohol and DuraPrep and draped in the usual sterile fashion. A "timeout" was performed as per usual protocol. A lateral incision was made extended from the proximal portion of the greater trochanter proximally. The fascia was incised in line with the skin incision and the fibers of the hip abductors were split in line. The tip of the greater  trochanter was palpated and a distally threaded guide pin was inserted into the tip of the greater trochanter and advanced into the medullary canal. Position was confirmed in both AP and lateral planes using the C-arm. A pilot hole was enlarged using a step drill.  We used a ball-tipped guidewire that was inserted from the tip of the greater trochanter across the fracture site into the shaft of the femur.  The nail length was measured as 360 mm long.  We then progressively reamed the femoral canal up to size 13.  We procured a an 11 mm diameter nail which was 360 mm long and inserted in an antegrade fashion.  This nail was 125 degrees angle for the lag screw.  We then did a guidewire through the hole in the nail into the femoral head.  The position of the wire was checked under AP and lateral position of the C arm.  The length of the lag screw was measured as 95 mm.  A 95 mm long lag screw was then inserted in an through the nail into the femoral head.  The nail assembly was removed.  No distal locking screw was placed since the fracture was a stable configuration.  Final AP and lateral x-rays at the hip and the knee were obtained.  The wound was irrigated with copious amounts of normal saline with antibiotic solution and suctioned dry. Good hemostasis was appreciated. The fascia was reapproximated using interrupted sutures of #1 Vicryl. Subcutaneous tissue was approximated layers using first #0 Vicryl followed #  2-0 Vicryl. The skin was closed with skin staples. A sterile dressing was applied.  The patient tolerated the procedure well and was transported to the recovery room in stable condition.   Creig Hines, M.D.

## 2019-06-27 NOTE — Anesthesia Preprocedure Evaluation (Signed)
Anesthesia Evaluation  Patient identified by MRN, date of birth, ID band Patient awake and Patient confused    Reviewed: Allergy & Precautions, NPO status , Patient's Chart, lab work & pertinent test results  History of Anesthesia Complications Negative for: history of anesthetic complications  Airway Mallampati: III       Dental   Pulmonary neg sleep apnea, neg COPD, Not current smoker,           Cardiovascular hypertension (not on meds), (-) Past MI and (-) CHF (-) dysrhythmias (-) Valvular Problems/Murmurs     Neuro/Psych neg Seizures Pt with confusion overnight. Now seems coherent.   GI/Hepatic Neg liver ROS, neg GERD  ,  Endo/Other  neg diabetes  Renal/GU negative Renal ROS     Musculoskeletal   Abdominal   Peds  Hematology   Anesthesia Other Findings   Reproductive/Obstetrics                             Anesthesia Physical Anesthesia Plan  ASA: III and emergent  Anesthesia Plan: Spinal   Post-op Pain Management:    Induction:   PONV Risk Score and Plan:   Airway Management Planned:   Additional Equipment:   Intra-op Plan:   Post-operative Plan:   Informed Consent: I have reviewed the patients History and Physical, chart, labs and discussed the procedure including the risks, benefits and alternatives for the proposed anesthesia with the patient or authorized representative who has indicated his/her understanding and acceptance.       Plan Discussed with:   Anesthesia Plan Comments:         Anesthesia Quick Evaluation

## 2019-06-27 NOTE — Progress Notes (Signed)
Pt agitated confused and combative trying to get out of the bed. Telemetry called and stating that heartrate is in the 140's. Dr. Marcille Blanco notified and is going to place orders.

## 2019-06-27 NOTE — Transfer of Care (Signed)
Immediate Anesthesia Transfer of Care Note  Patient: Cynthia Walker  Procedure(s) Performed: INTRAMEDULLARY (IM) NAIL INTERTROCHANTRIC (Left Hip)  Patient Location: PACU  Anesthesia Type:Spinal  Level of Consciousness: drowsy and patient cooperative  Airway & Oxygen Therapy: Patient Spontanous Breathing and Patient connected to nasal cannula oxygen  Post-op Assessment: Report given to RN and Post -op Vital signs reviewed and stable  Post vital signs: Reviewed and stable  Last Vitals:  Vitals Value Taken Time  BP 101/61 06/27/19 2024  Temp    Pulse 68 06/27/19 2023  Resp 17 06/27/19 2024  SpO2 100 % 06/27/19 2024  Vitals shown include unvalidated device data.  Last Pain:  Vitals:   06/27/19 1740  TempSrc:   PainSc: Asleep         Complications: No apparent anesthesia complications

## 2019-06-27 NOTE — Consult Note (Signed)
ORTHOPAEDIC CONSULTATION  PATIENT NAME: Cynthia Walker DOB: 08-16-1935  MRN: [id number]  REQUESTING PHYSICIAN: [name], MD  Chief Complaint: Left hip intratrochanteric fracture  HPI: Cynthia Walker is a 84 y.o. female who complains of left hip pain and inability to walk after a fall at home.  Patient was brought to the ER at [facility].  X-ray showed evidence of a comminuted left hip intertrochanteric fracture.  Patient has been admitted to the hospitalist service.  Patient denies pain anywhere else in her body.  Patient was ambulatory before the fall without any assistive devices.  Past Medical History:  Diagnosis Date  . Breast cancer [facility]) 2009   Right breast CA with lumpectomy, radiation and chemo tx's.  Marland Kitchen Hypertension   . Personal history of chemotherapy   . Personal history of radiation therapy    Past Surgical History:  Procedure Laterality Date  . BREAST BIOPSY Right 03/15/2008   invasive mammary carcinoma  . BREAST LUMPECTOMY Right 04/14/2008   Pathology revealed a 1.1 cm grade I invasive ductal carcinoma. Margins were negative.   . COLONOSCOPY WITH PROPOFOL N/A 07/10/2015   Procedure: COLONOSCOPY WITH PROPOFOL;  Surgeon: Hulen Luster, MD;  Location: [facility] ENDOSCOPY;  Service: Gastroenterology;  Laterality: N/A;  . TONSILLECTOMY     age of 84 years old   Social History   Socioeconomic History  . Marital status: Married    Spouse name: Not on file  . Number of children: Not on file  . Years of education: Not on file  . Highest education level: Not on file  Occupational History  . Not on file  Social Needs  . Financial resource strain: Not on file  . Food insecurity    Worry: Not on file    Inability: Not on file  . Transportation needs    Medical: Not on file    Non-medical: Not on file  Tobacco Use  . Smoking status: Never Smoker  . Smokeless tobacco: Never Used  Substance and Sexual Activity  . Alcohol use: No  . Drug  use: No  . Sexual activity: Not on file  Lifestyle  . Physical activity    Days per week: Not on file    Minutes per session: Not on file  . Stress: Not on file  Relationships  . Social Herbalist on phone: Not on file    Gets together: Not on file    Attends religious service: Not on file    Active member of club or organization: Not on file    Attends meetings of clubs or organizations: Not on file    Relationship status: Not on file  Other Topics Concern  . Not on file  Social History Narrative  . Not on file   Family History  Problem Relation Age of Onset  . Cancer Brother        lung cancer  . Cancer Sister        not sure  . Breast cancer Sister   . Heart disease Father   . Hypertension Father    Allergies  Allergen Reactions  . Flu Virus Vaccine     Other reaction(s): Other (See Comments) Stomach cramps  . Latex Swelling  . Other     Other reaction(s): Unknown Allergy to eggs  . Procaine Other (See Comments)    weakness  . Soy Isoflavones     Other reaction(s): Other (See Comments) Soy causes legs to feel  like rubber  . Penicillins Rash    Has patient had a PCN reaction causing immediate rash, facial/tongue/throat swelling, SOB or lightheadedness with hypotension: No Has patient had a PCN reaction causing severe rash involving mucus membranes or skin necrosis: No Has patient had a PCN reaction that required hospitalization: No Has patient had a PCN reaction occurring within the last 10 years: No If all of the above answers are "NO", then may proceed with Cephalosporin use.    Prior to Admission medications   Medication Sig Start Date End Date Taking? Authorizing Provider  Multiple Vitamin (MULTI-VITAMINS) TABS Take by mouth.    [provider]   Dg Chest 1 View  Result Date: 06/26/2019 CLINICAL DATA:  Pain status post fall EXAM: CHEST  1 VIEW COMPARISON:  April 12, 2019 FINDINGS: The heart size and mediastinal contours are within  normal limits. Both lungs are clear. The visualized skeletal structures are unremarkable. IMPRESSION: No active disease. Electronically Signed   By: Constance Holster M.D.   On: 06/26/2019 21:26   Dg Hip Unilat W Or W/o Pelvis 2-3 Views Left  Result Date: 06/26/2019 CLINICAL DATA:  Pain EXAM: DG HIP (WITH OR WITHOUT PELVIS) 2-3V LEFT COMPARISON:  None. FINDINGS: There is an acute displaced comminuted intratrochanteric fracture of the proximal left femur. There is no dislocation. No additional acute displaced fracture identified on this exam. IMPRESSION: Acute displaced comminuted intratrochanteric fracture of the proximal left femur. Electronically Signed   By: Constance Holster M.D.   On: 06/26/2019 21:24    Positive ROS: All other systems have been reviewed and were otherwise negative with the exception of those mentioned in the HPI and as above.  Physical Exam: General: Well developed, well nourished female seen in no acute distress. HEENT: Atraumatic and normocephalic. Sclera are clear. Extraocular motion is intact. Oropharynx is clear with moist mucosa. Neck: Supple, nontender, good range of motion. No JVD or carotid bruits. Lungs: Clear to auscultation bilaterally. Cardiovascular: Regular rate and rhythm with normal S1 and S2. No murmurs. No gallops or rubs. Pedal pulses are palpable bilaterally. Homans test is negative bilaterally. No significant pretibial or ankle edema. Abdomen: Soft, nontender, and nondistended. Bowel sounds are present. Skin: No lesions in the area of chief complaint Neurologic: Awake, alert, and oriented. Sensory function is grossly intact. Motor strength is felt to be 5 over 5 bilaterally. No clonus or tremor. Good motor coordination. Lymphatic: No axillary or cervical lymphadenopathy  MUSCULOSKELETAL: Left lower extremity is shortened and in external rotation.  She maintains intact flexion and extension of her toes and ankle.  Leg compartments are  soft.  Assessment: 84 years old female with displaced left hip intertrochanteric fracture after fall  Plan: 84 years old female with displaced left hip intertrochanteric fracture after fall from standing height at home.  I had a discussion with the patient.  Based on the nature of the fracture reduction and fixation with intramedullary device is recommended.  Patient understands that the risks involved in surgery include but not limited to risk of infection, implant related complications, need for further surgery, continued pain, DVT, pulmonary embolism, stroke and even death.  Her surgery scheduled for 06/27/2019.  Creig Hines, M.D.

## 2019-06-28 ENCOUNTER — Encounter: Payer: Self-pay | Admitting: Orthopedic Surgery

## 2019-06-28 LAB — BASIC METABOLIC PANEL
Anion gap: 7 (ref 5–15)
BUN: 18 mg/dL (ref 8–23)
CO2: 23 mmol/L (ref 22–32)
Calcium: 7.8 mg/dL — ABNORMAL LOW (ref 8.9–10.3)
Chloride: 107 mmol/L (ref 98–111)
Creatinine, Ser: 0.71 mg/dL (ref 0.44–1.00)
GFR calc Af Amer: >60 mL/min (ref >60)
GFR calc non Af Amer: >60 mL/min (ref >60)
Glucose, Bld: 149 mg/dL — ABNORMAL HIGH (ref 70–99)
Potassium: 3.8 mmol/L (ref 3.5–5.1)
Sodium: 137 mmol/L (ref 135–145)

## 2019-06-28 LAB — CBC
HCT: 28.3 % — ABNORMAL LOW (ref 36.0–46.0)
Hemoglobin: 9.2 g/dL — ABNORMAL LOW (ref 12.0–15.0)
MCH: 31.7 pg (ref 26.0–34.0)
MCHC: 32.5 g/dL (ref 30.0–36.0)
MCV: 97.6 fL (ref 80.0–100.0)
Platelets: 163 10*3/uL (ref 150–400)
RBC: 2.9 MIL/uL — ABNORMAL LOW (ref 3.87–5.11)
RDW: 12.1 % (ref 11.5–15.5)
WBC: 12.7 10*3/uL — ABNORMAL HIGH (ref 4.0–10.5)
nRBC: 0 % (ref 0.0–0.2)

## 2019-06-28 MED ORDER — MORPHINE SULFATE (PF) 2 MG/ML IV SOLN
2.0000 mg | INTRAVENOUS | Status: DC | PRN
  Administered 2019-06-28 (×2): 2 mg via INTRAVENOUS
  Filled 2019-06-28 (×2): qty 1

## 2019-06-28 MED ORDER — METOPROLOL TARTRATE 25 MG PO TABS
12.5000 mg | ORAL_TABLET | Freq: Two times a day (BID) | ORAL | Status: DC
  Administered 2019-06-28 – 2019-07-01 (×6): 12.5 mg via ORAL
  Filled 2019-06-28 (×7): qty 1

## 2019-06-28 MED ORDER — ENOXAPARIN SODIUM 30 MG/0.3ML ~~LOC~~ SOLN
30.0000 mg | Freq: Two times a day (BID) | SUBCUTANEOUS | Status: DC
  Administered 2019-06-28 – 2019-07-01 (×6): 30 mg via SUBCUTANEOUS
  Filled 2019-06-28 (×6): qty 0.3

## 2019-06-28 MED ORDER — METOPROLOL TARTRATE 25 MG PO TABS: 25.0000 mg | ORAL_TABLET | Freq: Two times a day (BID) | ORAL | Status: DC

## 2019-06-28 NOTE — Progress Notes (Signed)
Pt having pain agitated, nothing iv for pain. Dr. Jannifer Franklin to place order.

## 2019-06-28 NOTE — Progress Notes (Signed)
[place] [facility] [place] NAME: Cynthia Walker    MR#:  [id number]  DATE OF BIRTH:  Jun 15, 1935  SUBJECTIVE:   Status post left hip intramedullary nail postop day #1 today.  Husband is at bedside.  No other acute events overnight.  Patient remains somewhat confused.  REVIEW OF SYSTEMS:    Review of Systems  Unable to perform ROS: Dementia    Nutrition: Regular Tolerating Diet: Yes Tolerating PT: Eval noted.   DRUG ALLERGIES:   Allergies  Allergen Reactions  . Flu Virus Vaccine     Other reaction(s): Other (See Comments) Stomach cramps  . Latex Swelling  . Other     Other reaction(s): Unknown Allergy to eggs  . Procaine Other (See Comments)    weakness  . Soy Isoflavones     Other reaction(s): Other (See Comments) Soy causes legs to feel like rubber  . Penicillins Rash    Has patient had a PCN reaction causing immediate rash, facial/tongue/throat swelling, SOB or lightheadedness with hypotension: No Has patient had a PCN reaction causing severe rash involving mucus membranes or skin necrosis: No Has patient had a PCN reaction that required hospitalization: No Has patient had a PCN reaction occurring within the last 10 years: No If all of the above answers are "NO", then may proceed with Cephalosporin use.     VITALS:  Blood pressure (!) 141/70, pulse (!) 102, temperature 98.8 F (37.1 C), temperature source Axillary, resp. rate 17, height 5\' 4"  (1.626 m), weight 59 kg, SpO2 96 %.  PHYSICAL EXAMINATION:   Physical Exam  GENERAL:  84 y.o.-year-old patient lying in bed in no acute distress.  EYES: Pupils equal, round, reactive to light and accommodation. No scleral icterus. Extraocular muscles intact.  HEENT: Head atraumatic, normocephalic. Oropharynx and nasopharynx clear.  NECK:  Supple, no jugular venous distention. No thyroid enlargement, no tenderness.  LUNGS: Normal breath sounds bilaterally, no wheezing, rales,  rhonchi. No use of accessory muscles of respiration.  CARDIOVASCULAR: S1, S2 normal. No murmurs, rubs, or gallops.  ABDOMEN: Soft, nontender, nondistended. Bowel sounds present. No organomegaly or mass.  EXTREMITIES: No cyanosis, clubbing or edema b/l.  Left Hip dressing in place with no acute drainage or bleeding noted.  NEUROLOGIC: Cranial nerves II through XII are intact. No focal Motor or sensory deficits b/l.   PSYCHIATRIC: The patient is alert and oriented x 1.  SKIN: No obvious rash, lesion, or ulcer.    LABORATORY PANEL:   CBC Recent Labs  Lab 06/28/19 0505  WBC 12.7*  HGB 9.2*  HCT 28.3*  PLT 163   ------------------------------------------------------------------------------------------------------------------  Chemistries  Recent Labs  Lab 06/26/19 2038  06/28/19 0505  NA 139   < > 137  K 3.8   < > 3.8  CL 104   < > 107  CO2 24   < > 23  GLUCOSE 143*   < > 149*  BUN 14   < > 18  CREATININE 0.74   < > 0.71  CALCIUM 9.1   < > 7.8*  AST 26  --   --   ALT 15  --   --   ALKPHOS 75  --   --   BILITOT 0.4  --   --    < > = values in this interval not displayed.   ------------------------------------------------------------------------------------------------------------------  Cardiac Enzymes No results for input(s): TROPONINI in the last 168 hours. ------------------------------------------------------------------------------------------------------------------  RADIOLOGY:  Dg Chest 1  View  Result Date: 06/26/2019 CLINICAL DATA:  Pain status post fall EXAM: CHEST  1 VIEW COMPARISON:  April 12, 2019 FINDINGS: The heart size and mediastinal contours are within normal limits. Both lungs are clear. The visualized skeletal structures are unremarkable. IMPRESSION: No active disease. Electronically Signed   By: Constance Holster M.D.   On: 06/26/2019 21:26   Dg Hip Unilat W Or W/o Pelvis 2-3 Views Left  Result Date: 06/26/2019 CLINICAL DATA:  Pain EXAM: DG HIP (WITH  OR WITHOUT PELVIS) 2-3V LEFT COMPARISON:  None. FINDINGS: There is an acute displaced comminuted intratrochanteric fracture of the proximal left femur. There is no dislocation. No additional acute displaced fracture identified on this exam. IMPRESSION: Acute displaced comminuted intratrochanteric fracture of the proximal left femur. Electronically Signed   By: Constance Holster M.D.   On: 06/26/2019 21:24     ASSESSMENT AND PLAN:   84 year old female with past medical history of breast cancer, hypertension, history of dementia presented to the hospital after mechanical fall and noted to have a left hip fracture.  1.  Status post fall and left hip fracture- status post left hip intramedullary pinning postop day #1 today. - Continue pain control as per orthopedics. -Continue PT as tolerated.  Patient will likely need short-term rehab but patient's husband prefers to take her home.  2.  Leukocytosis- likely stress mediated from the fall. - improving and will cont. To monitor.   3.  History of breast cancer- currently in remission.  4.  Essential hypertension- patient's blood pressure is still slightly elevated she is also a bit tachycardic.  We will start the patient on some low-dose metoprolol for now.    All the records are reviewed and case discussed with Care Management/Social Worker. Management plans discussed with the patient, family and they are in agreement.  CODE STATUS: Full code  DVT Prophylaxis: Lovenox  TOTAL TIME TAKING CARE OF THIS PATIENT: 30 minutes.   POSSIBLE D/C IN 1-2 DAYS, DEPENDING ON CLINICAL CONDITION.   [name] M.D on 06/28/2019 at 1:00 PM  Between 7am to 6pm - Pager - [phone]  After 6pm go to [email or web address] - Proofreader  [facility]  Office  [phone]  CC: Primary care physician; Sofie Hartigan, MD

## 2019-06-28 NOTE — Progress Notes (Signed)
Notified MD of Mews score per protocol, no new orders at this time.

## 2019-06-28 NOTE — Progress Notes (Signed)
Subjective: 1 Day Post-Op Procedure(s) (LRB): INTRAMEDULLARY (IM) NAIL INTERTROCHANTRIC (Left) Patient reports pain as moderate.   Patient is well, and has had no acute complaints or problems We will start therapy today.  Plan is to go Rehab after hospital stay. no nausea and no vomiting Patient denies any chest pains or shortness of breath. Patient extremely confused this morning.  Does not know anybody in the room or touching her.  However she did allow evaluation of the wound area.  Husband states that after having pain medicine around 2:00 she slept until about 5 and is now extremity agitated and confused again.  Objective: Vital signs in last 24 hours: Temp:  [97.1 F (36.2 C)-99.5 F (37.5 C)] 98 F (36.7 C) (09/21 0757) Pulse Rate:  [67-121] 121 (09/21 0757) Resp:  [12-17] 17 (09/21 0520) BP: (88-162)/(55-91) 162/86 (09/21 0757) SpO2:  [94 %-100 %] 94 % (09/21 0757) Heels are non tender and elevated off the bed using rolled towels Intake/Output from previous day: 09/20 0701 - 09/21 0700 In: 1579.6 [I.V.:1479.6; IV Piggyback:100] Out: 725 [Urine:725] Intake/Output this shift: No intake/output data recorded.  Recent Labs    06/26/19 2038 06/27/19 1308 06/28/19 0505  HGB 13.1 11.1* 9.2*   Recent Labs    06/27/19 1308 06/28/19 0505  WBC 13.6* 12.7*  RBC 3.47* 2.90*  HCT 33.3* 28.3*  PLT 200 163   Recent Labs    06/27/19 1308 06/28/19 0505  NA 139 137  K 3.9 3.8  CL 105 107  CO2 26 23  BUN 16 18  CREATININE 0.77 0.71  GLUCOSE 132* 149*  CALCIUM 8.5* 7.8*   No results for input(s): LABPT, INR in the last 72 hours.  EXAM General - Patient is Alert and Confused Extremity - Neurologically intact Neurovascular intact Sensation intact distally Intact pulses distally Dorsiflexion/Plantar flexion intact Compartment soft Dressing - dressing C/D/I Motor Function - intact, moving foot and toes well on exam.    Past Medical History:  Diagnosis Date   . Breast cancer (HCC) 2009   Right breast CA with lumpectomy, radiation and chemo tx's.  . Hypertension   . Personal history of chemotherapy   . Personal history of radiation therapy     Assessment/Plan: 1 Day Post-Op Procedure(s) (LRB): INTRAMEDULLARY (IM) NAIL INTERTROCHANTRIC (Left) Principal Problem:   Femur fracture, left (HCC) Active Problems:   Essential hypertension   Short-term memory loss   Femoral neck fracture (HCC)  Estimated body mass index is 22.86 kg/m as calculated from the following:   Height as of 05/17/19: 5' 4" (1.626 m).   Weight as of 05/17/19: 60.4 kg. Advance diet Up with therapy D/C IV fluids Discharge to SNF  Labs: Met be within normal limits except for glucose which was discussed the risks 149.  Hemoglobin 9.2 DVT Prophylaxis - TED hose and SCD.  We will add Lovenox Weight-Bearing as tolerated to left leg D/C O2 and Pulse OX and try on Room Air Begin working on bowel movement Lovenox 30 mg twice daily while in the hospital and then switched to 40 mg daily for 2 weeks at discharge  Jon R. Wolfe PA [facility] 06/28/2019, 8:01 AM   

## 2019-06-28 NOTE — Progress Notes (Signed)
Gold colored ring found in the patient's shadow chart. Ring returned to patient's who placed on the patient's finger

## 2019-06-28 NOTE — Progress Notes (Signed)
Notified Ortho team([facility]) know that family wanted update per request.

## 2019-06-28 NOTE — Progress Notes (Signed)
Pt alert but very confused. Husband at the bedside. Medicated for pain with good result. Surgical dressing dry and intact. Iv infusing without difficulty. Foley patent and draining urine.

## 2019-06-28 NOTE — TOC Progression Note (Signed)
Transition of Care [facility]) - Progression Note    Patient Details  Name: Cynthia Walker MRN: [id number] Date of Birth: [birth year]  [facility]) CM/SW Contact  Katurah Karapetian, Lenice Llamas Phone Number: [phone]  06/28/2019, 2:15 PM  Clinical Narrative: Clinical Social Worker (CSW) presented SNF bed offers to patient's husband Richard. Husband chose [facility]. Per husband he would still like to take patient home if she improves enough with PT. Per Otila K[facility] liaison she will start [facility] SNF authorization today. CSW will continue to follow and assist as needed.      Expected Discharge Plan: [place] Barriers to Discharge: Continued Medical Work up  Expected Discharge Plan and Services Expected Discharge Plan: [place] In-house Referral: Clinical Social Work   Post Acute Care Choice: [place] Living arrangements for the past 2 months: Single Family Home Expected Discharge Date: 06/30/19                                     Social Determinants of Health (SDOH) Interventions    Readmission Risk Interventions No flowsheet data found.

## 2019-06-28 NOTE — Progress Notes (Signed)
OT Cancellation Note  Patient Details Name: Cynthia Walker MRN: [id number] DOB: January 21, 1935   Cancelled Treatment:    Reason Eval/Treat Not Completed: Other (comment). Consult received, chart reviewed. Spoke with RN. Per RN, pt remains agitated and also just received breakfast tray. Will hold OT evaluation at this time and re-attempt at later time as pt is appropriate.  Jeni Salles, MPH, MS, OTR/L ascom [phone] 06/28/19, 8:54 AM

## 2019-06-28 NOTE — Anesthesia Postprocedure Evaluation (Signed)
Anesthesia Post Note  Patient: Cynthia Walker  Procedure(s) Performed: INTRAMEDULLARY (IM) NAIL INTERTROCHANTRIC (Left Hip)  Patient location during evaluation: Nursing Unit Anesthesia Type: Spinal Level of consciousness: awake, patient uncooperative and confused (same as preop) Pain management: pain level controlled Vital Signs Assessment: post-procedure vital signs reviewed and stable Respiratory status: spontaneous breathing, respiratory function stable and nonlabored ventilation Cardiovascular status: blood pressure returned to baseline and stable Postop Assessment: no headache and no backache Anesthetic complications: no     Last Vitals:  Vitals:   06/28/19 0120 06/28/19 0520  BP: (!) 129/91 (!) 115/55  Pulse: 91 84  Resp: 17 17  Temp:  37.1 C  SpO2: 96% 94%    Last Pain:  Vitals:   06/28/19 0633  TempSrc:   PainSc: Asleep                 Johnna Acosta

## 2019-06-28 NOTE — TOC Initial Note (Signed)
Transition of Care [facility]) - Initial/Assessment Note    Patient Details  Name: Cynthia Walker MRN: [id number] Date of Birth: 12-May-1935  Transition of Care [facility]) CM/SW Contact:    Kaleiah Kutzer, Lenice Llamas Phone Number: [phone]  06/28/2019, 12:00 PM  Clinical Narrative: Clinical Social Worker (CSW) received SNF consult. PT is recommending SNF. CSW met with patient and her husband Delfino Lovett was at bedside. Patient was pleasantly confused and her husband participated in assessment. CSW introduced self and explained role of CSW department. Per husband they live in [place] and have 2 adult children. Husband reported that he is HPOA for patient. CSW explained SNF process and that [facility] will have to approve SNF. Husband reported that he prefers to take patient home however he will consider SNF. FL2 complete and faxed out. CSW provided husband CMS SNF list and CMS home health list. Per husband patient will need a rolling walker and bedside commode if she goes home. Husband reported that patient walked without a walker prior to hospitalization. CSW will continue to follow and assist as needed.                  Expected Discharge Plan: Skilled Nursing Facility Barriers to Discharge: Continued Medical Work up   Patient Goals and CMS Choice Patient states their goals for this hospitalization and ongoing recovery are:: To home home. CMS Medicare.gov Compare Post Acute Care list provided to:: Patient Represenative (must comment)(Patient's husband Herbie [place].) Choice offered to / list presented to : Spouse  Expected Discharge Plan and Services Expected Discharge Plan: [place] In-house Referral: Clinical Social Work   Post Acute Care Choice: [place] Living arrangements for the past 2 months: [place] Expected Discharge Date: 06/30/19                                    Prior Living Arrangements/Services Living arrangements for the past 2 months:  Single Family Home Lives with:: Spouse Patient language and need for interpreter reviewed:: No Do you feel safe going back to the place where you live?: Yes      Need for Family Participation in Patient Care: Yes (Comment) Care giver support system in place?: Yes (comment)   Criminal Activity/Legal Involvement Pertinent to Current Situation/Hospitalization: No - Comment as needed  Activities of Daily Living Home Assistive Devices/Equipment: Eyeglasses ADL Screening (condition at time of admission) Patient's cognitive ability adequate to safely complete daily activities?: Yes Is the patient deaf or have difficulty hearing?: No Does the patient have difficulty seeing, even when wearing glasses/contacts?: No Does the patient have difficulty concentrating, remembering, or making decisions?: No Patient able to express need for assistance with ADLs?: Yes Does the patient have difficulty dressing or bathing?: No Independently performs ADLs?: Yes (appropriate for developmental age) Does the patient have difficulty walking or climbing stairs?: Yes Weakness of Legs: None Weakness of Arms/Hands: None  Permission Sought/Granted Permission sought to share information with : Chartered certified accountant granted to share information with : Yes, Verbal Permission Granted              Emotional Assessment Appearance:: Appears stated age   Affect (typically observed): Pleasant, Calm Orientation: : Oriented to Self, Oriented to Place, Fluctuating Orientation (Suspected and/or reported Sundowners) Alcohol / Substance Use: Not Applicable Psych Involvement: No (comment)  Admission diagnosis:  Fall, initial encounter [W19.XXXA] Closed left hip fracture, initial encounter ([place]) [  S72.002A] Patient Active Problem List   Diagnosis Date Noted  . Femoral neck fracture ([place]) 06/27/2019  . Femur fracture, left ([place]) 06/26/2019  . Right lower lobe pulmonary nodule 05/04/2019  . Syncope  09/27/2017  . Atypical chest pain 09/03/2017  . Essential hypertension 09/03/2017  . Short-term memory loss 09/03/2017  . Osteoporosis 04/02/2016  . Breast cancer, right ([place]) 04/06/2008   PCP:  Sofie Hartigan, MD Pharmacy:   [id number] [place] [place] [address]Phone: [phone]: [address]Phone: [phone]: [phone]    Social Determinants of Health (SDOH) Interventions    Readmission Risk Interventions No flowsheet data found.

## 2019-06-28 NOTE — Evaluation (Addendum)
Physical Therapy Evaluation Patient Details Name: Cynthia Walker MRN: [id number] DOB: 01-Jul-1935 Today's Date: 06/28/2019   History of Present Illness  presented to ER secondary to mechanical fall with L hip pain; admitted for management of L intertrochanteric hip fracture, s/p IM nailing 06/27/19, WBAT.  Clinical Impression  Upon evaluation, patient alert and oriented to self only; intermittently follows simple verbal commands with increased time for processing and initiation, intermittent hand-over-hand to initiate and sequence tasks.  Reoriented to situation, location multiple times with limited carryover noted.  Maintains L LE in very guarded posture, limited tolerance for act assist or ROM due to pain.  Currently requiring max/total assist +2 for bed mobility; min to max assist for unsupported sitting balance (lists posteriorally with fatigue or divided attention); mod/max assist +2 for sit/stand and static standing balance; mod/max assist +2 for single step forward/backward with RW.  Poor tolerance for L LE WBing/stance time, poor balance and postural control.  Unsafe to attempt additional gait/mobility tasks at this time. Would benefit from skilled PT to address above deficits and promote optimal return to PLOF; recommend transition to STR upon discharge from acute hospitalization.  If patient/family pursue discharge home, may require total care set up for equipment; RW, 3 in 1, manual WC with cushion and hoyer lift.   Patient suffers from L hip fracture/repair which impairs his/her ability to perform daily activities like toileting, feeding, dressing, grooming, bathing in the home. A cane, walker, crutch will not resolve the patient's issue with performing activities of daily living. A lightweight wheelchair is required/recommended and will allow patient to safely perform daily activities.   Patient can safely propel the wheelchair in the home or has a caregiver who can provide assistance.        Follow Up Recommendations SNF    Equipment Recommendations  Wheelchair (measurements PT);Wheelchair cushion (measurements PT);3in1 (PT);Rolling walker with 5" wheels(hoyer lift)    Recommendations for Other Services       Precautions / Restrictions Precautions Precautions: Fall Restrictions Weight Bearing Restrictions: Yes LLE Weight Bearing: Weight bearing as tolerated      Mobility  Bed Mobility Overal bed mobility: Needs Assistance Bed Mobility: Supine to Sit;Sit to Supine     Supine to sit: Max assist Sit to supine: Max assist;Total assist;+2 for physical assistance   General bed mobility comments: poor dissociation of trunk and extremities; extensive hand-over-hand to initiate and complete  Transfers Overall transfer level: Needs assistance Equipment used: Rolling walker (2 wheeled) Transfers: Sit to/from Stand Sit to Stand: Mod assist;+2 physical assistance         General transfer comment: assist for lift off and standing balance, tendancy towards posterior LOB (absent self-righting reactions)  Ambulation/Gait Ambulation/Gait assistance: Max assist;+2 physical assistance Gait Distance (Feet): 1 Feet Assistive device: Rolling walker (2 wheeled)       General Gait Details: single step forward and backward, max assist +2; poor L LE stance time/WBing due to pain; decreased step height/length bilat; forward trunk flexion; limited ability to manage RW  Stairs            Wheelchair Mobility    Modified Rankin (Stroke Patients Only)       Balance Overall balance assessment: Needs assistance Sitting-balance support: No upper extremity supported;Feet supported Sitting balance-Leahy Scale: Poor Sitting balance - Comments: lists posteriorally with divided attention, very delayed/absent self-righting reactions Postural control: Posterior lean   Standing balance-Leahy Scale: Zero Standing balance comment: +2 at all times  Pertinent Vitals/Pain Pain Assessment: Faces Faces Pain Scale: Hurts even more Pain Location: L hip Pain Descriptors / Indicators: Aching;Guarding;Grimacing;Moaning Pain Intervention(s): Limited activity within patient's tolerance;Monitored during session;Repositioned    Home Living Family/patient expects to be discharged to:: Private residence Living Arrangements: Spouse/significant other Available Help at Discharge: Family;Available 24 hours/day Type of Home: House Home Access: Stairs to enter Entrance Stairs-Rails: Right Entrance Stairs-Number of Steps: 3-4 Home Layout: One level(does have 1-step down to sunken living room) Home Equipment: Cane - single point;Grab bars - toilet;Grab bars - tub/shower Additional Comments: Husband reports that son/daughter available to assist upon discharge (not present to verify themselves)    Prior Function Level of Independence: Independent         Comments: Indep with ADLs, household and limited community mobilization without assist device; husband assists with household chores, med management; has meal delivery service for food prep.  Denies additional falls outside of this event.     Hand Dominance   Dominant Hand: Right    Extremity/Trunk Assessment   Upper Extremity Assessment Upper Extremity Assessment: Overall WFL for tasks assessed(grossly at least 4-/5 throughout)    Lower Extremity Assessment Lower Extremity Assessment: (L LE grossly 3-/5, limited by pain, tolerating only 25% normal ROM due to muscle guarding; R LE grossly WFL)       Communication   Communication: No difficulties(per husband, "sometimes has trouble finishing sentences" at baseline)  Cognition Arousal/Alertness: Awake/alert Behavior During Therapy: Anxious Overall Cognitive Status: Impaired/Different from baseline                                 General Comments: oriented to self only; follows simple verbal commands with  increased time/cuing, intermittent hand-over-hand to initiate; difficulty with divided attention; poor awareness of situation, location despite frequent reorientation      General Comments      Exercises Other Exercises Other Exercises: L LE supine therex, 1x10, act assist: ankle pumps, heel slides, hip abduct/adduct.  Guarded, tolerating limited ROM due to pain. Other Exercises: Unsupported sitting edge of bed, worked to promote awareness of postural control, midline orientation and balance correction.  Able to maintain statically with cga/min assist intermittently; fluctuates to max assist with divided attention or fatigue.  Very delayed/absent balance/righting reactions   Assessment/Plan    PT Assessment Patient needs continued PT services  PT Problem List Decreased strength;Decreased range of motion;Decreased activity tolerance;Decreased balance;Decreased mobility;Decreased coordination;Decreased cognition;Decreased knowledge of use of DME;Decreased safety awareness;Decreased knowledge of precautions;Decreased skin integrity;Pain       PT Treatment Interventions DME instruction;Gait training;Stair training;Functional mobility training;Therapeutic activities;Therapeutic exercise;Balance training;Patient/family education    PT Goals (Current goals can be found in the Care Plan section)  Acute Rehab PT Goals Patient Stated Goal: per husband, "we want to be able to take her home" PT Goal Formulation: With patient/family Time For Goal Achievement: 07/12/19 Potential to Achieve Goals: Fair    Frequency 7X/week   Barriers to discharge        Co-evaluation PT/OT/SLP Co-Evaluation/Treatment: Yes Reason for Co-Treatment: Complexity of the patient's impairments (multi-system involvement);To address functional/ADL transfers;For patient/therapist safety PT goals addressed during session: Mobility/safety with mobility;Balance;Strengthening/ROM OT goals addressed during session: ADL's and  self-care;Strengthening/ROM       AM-PAC PT "6 Clicks" Mobility  Outcome Measure Help needed turning from your back to your side while in a flat bed without using bedrails?: A Lot Help needed moving from lying on your back  to sitting on the side of a flat bed without using bedrails?: A Lot Help needed moving to and from a bed to a chair (including a wheelchair)?: Total Help needed standing up from a chair using your arms (e.g., wheelchair or bedside chair)?: Total Help needed to walk in hospital room?: Total Help needed climbing 3-5 steps with a railing? : Total 6 Click Score: 8    End of Session Equipment Utilized During Treatment: Gait belt Activity Tolerance: Patient tolerated treatment well Patient left: in bed;with bed alarm set;with chair alarm set;with family/visitor present Nurse Communication: Mobility status PT Visit Diagnosis: Muscle weakness (generalized) (M62.81);Difficulty in walking, not elsewhere classified (R26.2);Pain Pain - Right/Left: Left Pain - part of body: Hip    Time: 0955-1040 PT Time Calculation (min) (ACUTE ONLY): 45 min   Charges:   PT Evaluation $PT Eval Moderate Complexity: 1 Mod PT Treatments $Therapeutic Activity: 8-22 mins        Zamari Bonsall H. Owens Shark, PT, DPT, NCS 06/28/19, 11:05 AM [phone]

## 2019-06-28 NOTE — Evaluation (Signed)
Occupational Therapy Evaluation Patient Details Name: Cynthia Walker MRN: [id number] DOB: 09-02-1935 Today's Date: 06/28/2019    History of Present Illness presented to ER secondary to mechanical fall with L hip pain; admitted for management of L intertrochanteric hip fracture, s/p IM nailing 06/27/19, WBAT.   Clinical Impression   Pt seen for OT evaluation with PT. Pt pleasant, agreeable, able to follow simple commands. Spouse present for session. Pt performs functional transfers with +2 assist. Difficulty with sitting dyn balance act and with additional distraction/multi-tasking. Pt reports indep with ADL however spouse notes that has has been known to "put 3 socks on one foot before." Pt reports that her spouse manages medications, otherwise she would forget. Pt demo's impairments in balance, pain, strength, cognition, and activity tolerance requiring increased assist for ADL and mobility tasks. Recommend SNF at this time. Pt/spouse report being eager to return home.     Follow Up Recommendations  SNF    Equipment Recommendations  3 in 1 bedside commode    Recommendations for Other Services       Precautions / Restrictions Precautions Precautions: Fall Restrictions Weight Bearing Restrictions: Yes LLE Weight Bearing: Weight bearing as tolerated      Mobility Bed Mobility Overal bed mobility: Needs Assistance Bed Mobility: Sit to Supine     Supine to sit: Max assist Sit to supine: Max assist;Total assist;+2 for physical assistance   General bed mobility comments: poor dissociation of trunk and extremities; extensive hand-over-hand to initiate and complete  Transfers Overall transfer level: Needs assistance Equipment used: Rolling walker (2 wheeled) Transfers: Sit to/from Stand Sit to Stand: Mod assist;+2 physical assistance         General transfer comment: assist for lift off and standing balance, tendancy towards posterior LOB (absent self-righting  reactions)    Balance Overall balance assessment: Needs assistance Sitting-balance support: No upper extremity supported;Feet supported Sitting balance-Leahy Scale: Poor Sitting balance - Comments: lists posteriorally with divided attention, very delayed/absent self-righting reactions Postural control: Posterior lean   Standing balance-Leahy Scale: Zero Standing balance comment: +2 at all times                           ADL either performed or assessed with clinical judgement   ADL Overall ADL's : Needs assistance/impaired                                       General ADL Comments: Max for LB ADL tasks and toilet transfers     Vision Baseline Vision/History: Wears glasses Wears Glasses: At all times Patient Visual Report: No change from baseline Vision Assessment?: No apparent visual deficits     Perception     Praxis      Pertinent Vitals/Pain Pain Assessment: Faces Faces Pain Scale: Hurts even more Pain Location: L hip Pain Descriptors / Indicators: Aching;Guarding;Grimacing;Moaning Pain Intervention(s): Limited activity within patient's tolerance;Monitored during session;Repositioned;Ice applied     Hand Dominance Right   Extremity/Trunk Assessment Upper Extremity Assessment Upper Extremity Assessment: Overall WFL for tasks assessed(grossly at least 4-/5 throughout)   Lower Extremity Assessment Lower Extremity Assessment: (L LE grossly 3-/5, limited by pain, tolerating only 25% normal ROM due to muscle guarding; R LE grossly WFL)       Communication Communication Communication: No difficulties(per husband, "sometimes has trouble finishing sentences" at baseline)   Cognition Arousal/Alertness: Awake/alert Behavior During  Therapy: Anxious Overall Cognitive Status: Impaired/Different from baseline                                 General Comments: oriented to self only; follows simple verbal commands with increased  time/cuing, intermittent hand-over-hand to initiate; difficulty with divided attention; poor awareness of situation, location despite frequent reorientation   General Comments       Exercises Other Exercises Other Exercises: L LE supine therex, 1x10, act assist: ankle pumps, heel slides, hip abduct/adduct.  Guarded, tolerating limited ROM due to pain. Other Exercises: Unsupported sitting edge of bed, worked to promote awareness of postural control, midline orientation and balance correction.  Able to maintain statically with cga/min assist intermittently; fluctuates to max assist with divided attention or fatigue.  Very delayed/absent balance/righting reactions   Shoulder Instructions      Home Living Family/patient expects to be discharged to:: Private residence Living Arrangements: Spouse/significant other Available Help at Discharge: Family;Available 24 hours/day Type of Home: House Home Access: Stairs to enter [employer] of Steps: 3-4 Entrance Stairs-Rails: Right Home Layout: One level(does have 1-step down to sunken living room)     Bathroom Shower/Tub: Tub/shower unit     Bathroom Accessibility: Yes   Home Equipment: Cane - single point;Grab bars - toilet;Grab bars - tub/shower   Additional Comments: Husband reports that son/daughter available to assist upon discharge (not present to verify themselves)      Prior Functioning/Environment Level of Independence: Independent        Comments: Indep with ADLs, household and limited community mobilization without assist device; husband assists with household chores, med management; has meal delivery service for food prep.  Denies additional falls outside of this event.        OT Problem List: Decreased strength;Pain;Decreased cognition;Decreased safety awareness;Decreased activity tolerance;Impaired balance (sitting and/or standing);Decreased knowledge of use of DME or AE      OT Treatment/Interventions:  Self-care/ADL training;Therapeutic exercise;Therapeutic activities;Cognitive remediation/compensation;DME and/or AE instruction;Patient/family education;Balance training    OT Goals(Current goals can be found in the care plan section) Acute Rehab OT Goals Patient Stated Goal: per husband, "we want to be able to take her home" OT Goal Formulation: With patient/family Time For Goal Achievement: 07/12/19 Potential to Achieve Goals: Good ADL Goals Pt Will Perform Lower Body Dressing: with caregiver independent in assisting;sit to/from stand;with mod assist Pt Will Transfer to Toilet: with mod assist;bedside commode;stand pivot transfer  OT Frequency: Min 2X/week   Barriers to D/C:            Co-evaluation PT/OT/SLP Co-Evaluation/Treatment: Yes Reason for Co-Treatment: Complexity of the patient's impairments (multi-system involvement);For patient/therapist safety;To address functional/ADL transfers PT goals addressed during session: Mobility/safety with mobility;Balance;Strengthening/ROM OT goals addressed during session: ADL's and self-care;Strengthening/ROM      AM-PAC OT "6 Clicks" Daily Activity     Outcome Measure Help from another person eating meals?: None Help from another person taking care of personal grooming?: A Little Help from another person toileting, which includes using toliet, bedpan, or urinal?: A Lot Help from another person bathing (including washing, rinsing, drying)?: A Lot Help from another person to put on and taking off regular upper body clothing?: A Lot Help from another person to put on and taking off regular lower body clothing?: A Lot 6 Click Score: 15   End of Session Equipment Utilized During Treatment: Gait belt;Rolling walker Nurse Communication: Other (comment)(L mit on, pt needs PureWick (Notified  RN and nurse tech))  Activity Tolerance: Patient tolerated treatment well Patient left: in bed;with call bell/phone within reach;with bed alarm set;with  family/visitor present;with SCD's reapplied;Other (comment)(L mit reapplied to L hand, RN notified)  OT Visit Diagnosis: Other abnormalities of gait and mobility (R26.89)                Time: 8590-9311 OT Time Calculation (min): 22 min Charges:  OT General Charges $OT Visit: 1 Visit OT Treatments $Therapeutic Activity: 8-22 mins  Jeni Salles, MPH, MS, OTR/L ascom [phone] 06/28/19, 1:33 PM

## 2019-06-28 NOTE — NC FL2 (Signed)
[place] LEVEL OF CARE SCREENING TOOL     IDENTIFICATION  Patient Name: Cynthia Walker Birthdate: Jan 15, 1935 Sex: female Admission Date (Current Location): 06/26/2019  [place] and [place] Number:  Engineering geologist and Address:  [facility], [address] [place], [place] [address]      Provider Number: [id number]  Attending Physician Name and Address:  [name], MD  Relative Name and Phone Number:       Current Level of Care: Hospital Recommended Level of Care: [place] Prior Approval Number:    Date Approved/Denied:   PASRR Number: [id number]  Discharge Plan: SNF    Current Diagnoses: Patient Active Problem List   Diagnosis Date Noted  . Femoral neck fracture ([place]) 06/27/2019  . Femur fracture, left ([place]) 06/26/2019  . Right lower lobe pulmonary nodule 05/04/2019  . Syncope 09/27/2017  . Atypical chest pain 09/03/2017  . Essential hypertension 09/03/2017  . Short-term memory loss 09/03/2017  . Osteoporosis 04/02/2016  . Breast cancer, right ([place]) 04/06/2008    Orientation RESPIRATION BLADDER Height & Weight     Self, Place, Situation  Normal Continent Weight: 130 lb (59 kg) Height:  5\' 4"  (162.6 cm)  BEHAVIORAL SYMPTOMS/MOOD NEUROLOGICAL BOWEL NUTRITION STATUS      Continent Diet(Diet: Regular)  AMBULATORY STATUS COMMUNICATION OF NEEDS Skin   Extensive Assist Verbally Surgical wounds(Incision: Left Hip.)                       Personal Care Assistance Level of Assistance  Bathing, Feeding, Dressing Bathing Assistance: Limited assistance Feeding assistance: Limited assistance Dressing Assistance: Limited assistance     Functional Limitations Info  Sight, Hearing, Speech Sight Info: Adequate Hearing Info: Adequate Speech Info: Adequate    SPECIAL CARE FACTORS FREQUENCY  PT (By licensed PT), OT (By licensed OT)     PT Frequency: 5 OT Frequency: 5             Contractures      Additional Factors Info  Code Status, Allergies Code Status Info: Full Code. Allergies Info: Flu Virus Vaccine, Latex, Other, Procaine, Soy, Isoflavones, Penicillins           Current Medications (06/28/2019):  This is the current hospital active medication list Current Facility-Administered Medications  Medication Dose Route Frequency Provider Last Rate Last Dose  . 0.9 %  sodium chloride infusion   Intravenous Continuous Creig Hines, MD 100 mL/hr at 06/27/19 2251    . acetaminophen (TYLENOL) tablet 650 mg  650 mg Oral Q6H PRN Lance Coon, MD   650 mg at 06/28/19 5638   Or  . acetaminophen (TYLENOL) suppository 650 mg  650 mg Rectal Q6H PRN Lance Coon, MD      . aspirin EC tablet 325 mg  325 mg Oral Q breakfast Creig Hines, MD   325 mg at 06/28/19 0909  . bisacodyl (DULCOLAX) suppository 10 mg  10 mg Rectal Daily PRN Creig Hines, MD      . ceFAZolin (ANCEF) IVPB 2g/100 mL premix  2 g Intravenous Q6H Creig Hines, MD 200 mL/hr at 06/28/19 0454 2 g at 06/28/19 0454  . celecoxib (CELEBREX) capsule 200 mg  200 mg Oral BID Creig Hines, MD   200 mg at 06/28/19 0909  . enoxaparin (LOVENOX) injection 30 mg  30 mg Subcutaneous Q12H Vance Peper R, PA      . gabapentin (NEURONTIN) capsule 300 mg  300 mg Oral QHS Durrani,  Shakeel, MD      . magnesium hydroxide (MILK OF MAGNESIA) suspension 30 mL  30 mL Oral Daily PRN Creig Hines, MD   30 mL at 06/28/19 0910  . menthol-cetylpyridinium (CEPACOL) lozenge 3 mg  1 lozenge Oral PRN Creig Hines, MD       Or  . phenol (CHLORASEPTIC) mouth spray 1 spray  1 spray Mouth/Throat PRN Creig Hines, MD      . methocarbamol (ROBAXIN) 500 mg in dextrose 5 % 50 mL IVPB  500 mg Intravenous Q8H PRN Lance Coon, MD      . metoCLOPramide (REGLAN) tablet 5-10 mg  5-10 mg Oral Q8H PRN Creig Hines, MD       Or  . metoCLOPramide (REGLAN) injection 5-10 mg  5-10 mg Intravenous Q8H PRN Creig Hines,  MD      . morphine 2 MG/ML injection 2 mg  2 mg Intravenous Q4H PRN Lance Coon, MD   2 mg at 06/28/19 0603  . ondansetron (ZOFRAN) tablet 4 mg  4 mg Oral Q6H PRN Lance Coon, MD       Or  . ondansetron [facility]) injection 4 mg  4 mg Intravenous Q6H PRN Lance Coon, MD      . oxyCODONE (Oxy IR/ROXICODONE) immediate release tablet 5 mg  5 mg Oral Q4H PRN Creig Hines, MD   5 mg at 06/28/19 0909  . sodium phosphate (FLEET) 7-19 GM/118ML enema 1 enema  1 enema Rectal Once PRN Creig Hines, MD         Discharge Medications: Please see discharge summary for a list of discharge medications.  Relevant Imaging Results:  Relevant Lab Results:   Additional Information SSN: [id number]  Gladyes Kudo, Veronia Beets, [place]

## 2019-06-29 MED ORDER — CALCIUM CARBONATE-VITAMIN D 500-200 MG-UNIT PO TABS
1.0000 | ORAL_TABLET | Freq: Two times a day (BID) | ORAL | Status: DC
  Administered 2019-06-29 – 2019-07-01 (×4): 1 via ORAL
  Filled 2019-06-29 (×4): qty 1

## 2019-06-29 NOTE — Care Management Important Message (Signed)
Important Message  Patient Details  Name: Cynthia Walker MRN: [id number] Date of Birth: 04-28-1935   Medicare Important Message Given:  Yes     [name] 06/29/2019, 11:16 AM

## 2019-06-29 NOTE — Progress Notes (Signed)
Physical Therapy Treatment Patient Details Name: Cynthia Walker MRN: [id number] DOB: 1935/05/23 Today's Date: 06/29/2019    History of Present Illness Pt presented to ER secondary to mechanical fall with L hip pain. Pt was admitted for management of L intertrochanteric hip fracture and is now s/p IM nailing 06/27/19, WBAT.    PT Comments    Pt continues to be limited in mobility secondary to L hip pain and difficulty following commands, requiring frequent encouragement and tactile cuing throughout session. Pt initially lethargic at start of session, though demos increased participation once upright. Pt requires max A +2 to perform supine>sit, and demos improved sitting balance at edge of bed today with bilateral UE support and close supervision/CGA. Pt able to transfer from bed to chair today requiring extra time and max A +2 for sequencing, RW management and weight-shifting to allow for appropriate foot placement during transfer.     Follow Up Recommendations  SNF     Equipment Recommendations  Wheelchair (measurements PT);Wheelchair cushion (measurements PT);3in1 (PT);Rolling walker with 5" wheels    Recommendations for Other Services       Precautions / Restrictions Precautions Precautions: Fall Restrictions Weight Bearing Restrictions: Yes LLE Weight Bearing: Weight bearing as tolerated    Mobility  Bed Mobility Overal bed mobility: Needs Assistance Bed Mobility: Supine to Sit     Supine to sit: Max assist;+2 for physical assistance     General bed mobility comments: Requiring encouragement and tactile cuing for sequencing and assistance for management of LEs and trunk.  Transfers Overall transfer level: Needs assistance Equipment used: Rolling walker (2 wheeled) Transfers: Sit to/from [facility] Sit to Stand: +2 physical assistance;Max assist Stand pivot transfers: Max assist;+2 physical assistance       General transfer comment: Assistance  for lift off and standing balance with STS. Assistance during squat pivot for RW management and weight shifting with verbal and tactile cues for foot placement/sequencing.  Ambulation/Gait             General Gait Details: Deferred gait   Stairs             Wheelchair Mobility    Modified Rankin (Stroke Patients Only)       Balance Overall balance assessment: Needs assistance Sitting-balance support: Feet supported;No upper extremity supported Sitting balance-Leahy Scale: Fair Sitting balance - Comments: Improved sitting balance today - able to sit edge of bed with close supervision/CGA   Standing balance support: Bilateral upper extremity supported Standing balance-Leahy Scale: Zero Standing balance comment: +2 at all times                            Cognition Arousal/Alertness: Awake/alert Behavior During Therapy: Anxious(requiring encouragement for mobility) Overall Cognitive Status: Within Functional Limits for tasks assessed(though initially lethargic, and requiring frequent tactile cuing for attention and performance of tasks) Area of Impairment: Following commands                       Following Commands: Follows one step commands inconsistently       General Comments: Difficulty following verbal commands requiring increased time and cuing; frequent tactile cuing required for correct performance of mobility tasks. Pt requiring frequent encouragement to complete tasks.      Exercises General Exercises - Lower Extremity Gluteal Sets: Strengthening;10 reps;Both;Supine Long Arc Quad: Seated;Both;5 reps;AAROM(with visual target of PT's hand; difficulty following commands) Heel Slides: AAROM;Left;10 reps;Supine  General Comments        Pertinent Vitals/Pain Pain Assessment: No/denies pain(while resting in bed; increasing with mobility.) Pain Location: L hip Pain Descriptors / Indicators: Aching;Guarding;Grimacing;Moaning Pain  Intervention(s): Limited activity within patient's tolerance;Monitored during session;Repositioned    Home Living                      Prior Function            PT Goals (current goals can now be found in the care plan section) Acute Rehab PT Goals Patient Stated Goal: per husband, "we want to be able to take her home" PT Goal Formulation: With patient/family Time For Goal Achievement: 07/12/19 Potential to Achieve Goals: Fair Progress towards PT goals: Progressing toward goals    Frequency    7X/week      PT Plan Current plan remains appropriate    Co-evaluation              AM-PAC PT "6 Clicks" Mobility   Outcome Measure  Help needed turning from your back to your side while in a flat bed without using bedrails?: A Lot Help needed moving from lying on your back to sitting on the side of a flat bed without using bedrails?: A Lot Help needed moving to and from a bed to a chair (including a wheelchair)?: Total Help needed standing up from a chair using your arms (e.g., wheelchair or bedside chair)?: Total Help needed to walk in hospital room?: Total Help needed climbing 3-5 steps with a railing? : Total 6 Click Score: 8    End of Session Equipment Utilized During Treatment: Gait belt Activity Tolerance: Patient limited by pain Patient left: in chair;with call bell/phone within reach;with chair alarm set;with family/visitor present Nurse Communication: Mobility status PT Visit Diagnosis: Muscle weakness (generalized) (M62.81);Difficulty in walking, not elsewhere classified (R26.2);Pain Pain - Right/Left: Left Pain - part of body: Hip     Time: 4784-1282 PT Time Calculation (min) (ACUTE ONLY): 32 min  Charges:  $Therapeutic Exercise: 8-22 mins $Therapeutic Activity: 8-22 mins                     Petra Kuba, PT, DPT 06/29/19, 4:45 PM

## 2019-06-29 NOTE — Progress Notes (Signed)
   Subjective: 2 Days Post-Op Procedure(s) (LRB): INTRAMEDULLARY (IM) NAIL INTERTROCHANTRIC (Left) Patient reports pain as mild.   Patient is well, and has had no acute complaints or problems Therapy was initiated yesterday but patient was not able to cooperate. Plan is to go Rehab after hospital stay. no nausea and no vomiting Patient denies any chest pains or shortness of breath.  Patient doing much better today.  Not near as confused.  Was sleepy but was able to communicate. Objective: Vital signs in last 24 hours: Temp:  [98 F (36.7 C)-98.8 F (37.1 C)] 98.5 F (36.9 C) (09/21 2212) Pulse Rate:  [93-121] 101 (09/21 2212) Resp:  [17-18] 18 (09/21 2212) BP: (109-162)/(64-86) 150/76 (09/21 2212) SpO2:  [94 %-99 %] 94 % (09/21 2212) Weight:  [59 kg] 59 kg (09/21 1016) well approximated incision Heels are non tender and elevated off the bed using rolled towels Intake/Output from previous day: 09/21 0701 - 09/22 0700 In: 240 [P.O.:240] Out: -  Intake/Output this shift: No intake/output data recorded.  Recent Labs    06/26/19 2038 06/27/19 1308 06/28/19 0505  HGB 13.1 11.1* 9.2*   Recent Labs    06/27/19 1308 06/28/19 0505  WBC 13.6* 12.7*  RBC 3.47* 2.90*  HCT 33.3* 28.3*  PLT 200 163   Recent Labs    06/27/19 1308 06/28/19 0505  NA 139 137  K 3.9 3.8  CL 105 107  CO2 26 23  BUN 16 18  CREATININE 0.77 0.71  GLUCOSE 132* 149*  CALCIUM 8.5* 7.8*   No results for input(s): LABPT, INR in the last 72 hours.  EXAM General - Patient is Alert, Appropriate and Oriented Extremity - Neurologically intact Neurovascular intact Sensation intact distally Intact pulses distally No cellulitis present Compartment soft Dressing - dressing C/D/I Motor Function - intact, moving foot and toes well on exam.    Past Medical History:  Diagnosis Date  . Breast cancer [facility]) 2009   Right breast CA with lumpectomy, radiation and chemo tx's.  [place] Kitchen Hypertension   . Personal  history of chemotherapy   . Personal history of radiation therapy     Assessment/Plan: 2 Days Post-Op Procedure(s) (LRB): INTRAMEDULLARY (IM) NAIL INTERTROCHANTRIC (Left) Principal Problem:   Femur fracture, left (HCC) Active Problems:   Essential hypertension   Short-term memory loss   Femoral neck fracture (HCC)  Estimated body mass index is 22.31 kg/m as calculated from the following:   Height as of this encounter: 5\' 4"  (1.626 m).   Weight as of this encounter: 59 kg. Up with therapy Discharge to SNF when medically cleared  Labs: None DVT Prophylaxis - Lovenox, TED hose and SCD bilaterally Weight-Bearing as tolerated to left leg D/C O2 and Pulse OX and try on Room Air Begin working on bowel movement Lovenox 30 mg twice daily while in the hospital and then switched to 40 mg daily for 2 weeks at discharge Will need to follow-up in clinical clinic in 2 weeks  Jon R. [place] 06/29/2019, 6:56 AM

## 2019-06-29 NOTE — Progress Notes (Signed)
[place] at [place] NAME: Cynthia Walker    MR#:  [id number]  DATE OF BIRTH:  1935-01-29  SUBJECTIVE:   Status post left hip intramedullary nail postop day #2 today.  Husband is at bedside.  No other acute events overnight.  Pain is improved.  Awaiting disposition to short-term rehab.  REVIEW OF SYSTEMS:    Review of Systems  Unable to perform ROS: Dementia    Nutrition: Regular Tolerating Diet: Yes Tolerating PT: Eval noted.   DRUG ALLERGIES:   Allergies  Allergen Reactions  . Flu Virus Vaccine     Other reaction(s): Other (See Comments) Stomach cramps  . Latex Swelling  . Other     Other reaction(s): Unknown Allergy to eggs  . Procaine Other (See Comments)    weakness  . Soy Isoflavones     Other reaction(s): Other (See Comments) Soy causes legs to feel like rubber  . Penicillins Rash    Has patient had a PCN reaction causing immediate rash, facial/tongue/throat swelling, SOB or lightheadedness with hypotension: No Has patient had a PCN reaction causing severe rash involving mucus membranes or skin necrosis: No Has patient had a PCN reaction that required hospitalization: No Has patient had a PCN reaction occurring within the last 10 years: No If all of the above answers are "NO", then may proceed with Cephalosporin use.     VITALS:  Blood pressure (!) 116/58, pulse 80, temperature 97.9 F (36.6 C), temperature source Oral, resp. rate 18, height 5\' 4"  (1.626 m), weight 59 kg, SpO2 95 %.  PHYSICAL EXAMINATION:   Physical Exam  GENERAL:  84 y.o.-year-old patient lying in bed in no acute distress.  EYES: Pupils equal, round, reactive to light and accommodation. No scleral icterus. Extraocular muscles intact.  HEENT: Head atraumatic, normocephalic. Oropharynx and nasopharynx clear.  NECK:  Supple, no jugular venous distention. No thyroid enlargement, no tenderness.  LUNGS: Normal breath sounds bilaterally, no  wheezing, rales, rhonchi. No use of accessory muscles of respiration.  CARDIOVASCULAR: S1, S2 normal. No murmurs, rubs, or gallops.  ABDOMEN: Soft, nontender, nondistended. Bowel sounds present. No organomegaly or mass.  EXTREMITIES: No cyanosis, clubbing or edema b/l.  Left Hip dressing in place with no acute drainage or bleeding noted. Some bruising noted around the surgical site.   NEUROLOGIC: Cranial nerves II through XII are intact. No focal Motor or sensory deficits b/l.   PSYCHIATRIC: The patient is alert and oriented x 1.  SKIN: No obvious rash, lesion, or ulcer.    LABORATORY PANEL:   CBC Recent Labs  Lab 06/28/19 0505  WBC 12.7*  HGB 9.2*  HCT 28.3*  PLT 163   ------------------------------------------------------------------------------------------------------------------  Chemistries  Recent Labs  Lab 06/26/19 2038  06/28/19 0505  NA 139   < > 137  K 3.8   < > 3.8  CL 104   < > 107  CO2 24   < > 23  GLUCOSE 143*   < > 149*  BUN 14   < > 18  CREATININE 0.74   < > 0.71  CALCIUM 9.1   < > 7.8*  AST 26  --   --   ALT 15  --   --   ALKPHOS 75  --   --   BILITOT 0.4  --   --    < > = values in this interval not displayed.   ------------------------------------------------------------------------------------------------------------------  Cardiac Enzymes No results for input(s): TROPONINI  in the last 168 hours. ------------------------------------------------------------------------------------------------------------------  RADIOLOGY:  No results found.   ASSESSMENT AND PLAN:   84 year old female with past medical history of breast cancer, hypertension, history of dementia presented to the hospital after mechanical fall and noted to have a left hip fracture.  1.  Status post fall and left hip fracture- status post left hip intramedullary pinning postop day #2 today. - Continue pain control as per orthopedics. -Continue PT as tolerated.  Patient will  likely need short-term rehab upon discharge and pt's husband in agreement now.   2.  Leukocytosis- likely stress mediated from the fall. - trending down. NO evidence of infection.   3.  History of breast cancer- currently in remission.  4.  Essential hypertension- cont. Low dose Metoprolol.  - BP improved.    All the records are reviewed and case discussed with Care Management/Social Worker. Management plans discussed with the patient, family and they are in agreement.  CODE STATUS: Full code  DVT Prophylaxis: Lovenox  TOTAL TIME TAKING CARE OF THIS PATIENT: 30 minutes.   POSSIBLE D/C IN 1-2 DAYS, DEPENDING ON CLINICAL CONDITION.   [name] M.D on 06/29/2019 at 1:09 PM  Between 7am to 6pm - Pager - [phone]  After 6pm go to [email or web address] - Proofreader  [facility]  Office  [phone]  CC: Primary care physician; Sofie Hartigan, MD

## 2019-06-30 LAB — SARS CORONAVIRUS 2 (TAT 6-24 HRS): SARS Coronavirus 2: NEGATIVE

## 2019-06-30 NOTE — Progress Notes (Signed)
[place] at [place] NAME: Cynthia Walker    MR#:  [id number]  DATE OF BIRTH:  January 18, 1935  SUBJECTIVE:   Status post left hip intramedullary nail postop day #3 today.  Husband is at bedside.  No other acute events overnight. Working with PT.   REVIEW OF SYSTEMS:    Review of Systems  Unable to perform ROS: Dementia    Nutrition: Regular Tolerating Diet: Yes Tolerating PT: Eval noted.   DRUG ALLERGIES:   Allergies  Allergen Reactions  . Flu Virus Vaccine     Other reaction(s): Other (See Comments) Stomach cramps  . Latex Swelling  . Other     Other reaction(s): Unknown Allergy to eggs  . Procaine Other (See Comments)    weakness  . Soy Isoflavones     Other reaction(s): Other (See Comments) Soy causes legs to feel like rubber  . Penicillins Rash    Has patient had a PCN reaction causing immediate rash, facial/tongue/throat swelling, SOB or lightheadedness with hypotension: No Has patient had a PCN reaction causing severe rash involving mucus membranes or skin necrosis: No Has patient had a PCN reaction that required hospitalization: No Has patient had a PCN reaction occurring within the last 10 years: No If all of the above answers are "NO", then may proceed with Cephalosporin use.     VITALS:  Blood pressure 136/71, pulse 87, temperature 98.2 F (36.8 C), temperature source Oral, resp. rate 15, height 5\' 4"  (1.626 m), weight 59 kg, SpO2 96 %.  PHYSICAL EXAMINATION:   Physical Exam  GENERAL:  84 y.o.-year-old patient lying in bed in no acute distress.  EYES: Pupils equal, round, reactive to light and accommodation. No scleral icterus. Extraocular muscles intact.  HEENT: Head atraumatic, normocephalic. Oropharynx and nasopharynx clear.  NECK:  Supple, no jugular venous distention. No thyroid enlargement, no tenderness.  LUNGS: Normal breath sounds bilaterally, no wheezing, rales, rhonchi. No use of accessory  muscles of respiration.  CARDIOVASCULAR: S1, S2 normal. No murmurs, rubs, or gallops.  ABDOMEN: Soft, nontender, nondistended. Bowel sounds present. No organomegaly or mass.  EXTREMITIES: No cyanosis, clubbing or edema b/l.  Left Hip dressing in place with no acute drainage or bleeding noted. Some bruising noted around the surgical site.   NEUROLOGIC: Cranial nerves II through XII are intact. No focal Motor or sensory deficits b/l.   PSYCHIATRIC: The patient is alert and oriented x 2.  SKIN: No obvious rash, lesion, or ulcer.    LABORATORY PANEL:   CBC Recent Labs  Lab 06/28/19 0505  WBC 12.7*  HGB 9.2*  HCT 28.3*  PLT 163   ------------------------------------------------------------------------------------------------------------------  Chemistries  Recent Labs  Lab 06/26/19 2038  06/28/19 0505  NA 139   < > 137  K 3.8   < > 3.8  CL 104   < > 107  CO2 24   < > 23  GLUCOSE 143*   < > 149*  BUN 14   < > 18  CREATININE 0.74   < > 0.71  CALCIUM 9.1   < > 7.8*  AST 26  --   --   ALT 15  --   --   ALKPHOS 75  --   --   BILITOT 0.4  --   --    < > = values in this interval not displayed.   ------------------------------------------------------------------------------------------------------------------  Cardiac Enzymes No results for input(s): TROPONINI in the last 168 hours. ------------------------------------------------------------------------------------------------------------------  RADIOLOGY:  No results found.   ASSESSMENT AND PLAN:   84 year old female with past medical history of breast cancer, hypertension, history of dementia presented to the hospital after mechanical fall and noted to have a left hip fracture.  1.  Status post fall and left hip fracture- status post left hip intramedullary pinning postop day #3 today. - Continue pain control with PRN Oxycodone.  - Tolerating PT and awaiting dispo to SNF/STR.    2.  Leukocytosis- likely stress  mediated from the fall. - no evidence of infection.   3.  History of breast cancer- currently in remission.  4.  Essential hypertension- cont. Low dose Metoprolol.  - BP stable.    Repeat COVID-19 test sent today for anticipation for dispo to SNF tomorrow.    All the records are reviewed and case discussed with Care Management/Social Worker. Management plans discussed with the patient, family and they are in agreement.  CODE STATUS: Full code  DVT Prophylaxis: Lovenox  TOTAL TIME TAKING CARE OF THIS PATIENT: 30 minutes.   POSSIBLE D/C IN 1-2 DAYS, DEPENDING ON CLINICAL CONDITION.   [name] M.D on 06/30/2019 at 12:41 PM  Between 7am to 6pm - Pager - [phone]  After 6pm go to [email or web address] - Proofreader  [facility] [place] [facility]  Office  [phone]  CC: Primary care physician; Sofie Hartigan, MD

## 2019-06-30 NOTE — TOC Progression Note (Signed)
Transition of Care [facility]) - Progression Note    Patient Details  Name: Cynthia Walker MRN: [id number] Date of Birth: [birth year]  [facility] CM/SW Contact  Rickelle Sylvestre, Lenice Llamas Phone Number: [phone]  06/30/2019, 11:55 AM  Clinical Narrative: Per Otila Klu[facility] liaison [facility] SNF authorization has been received. Per Otila Kluver patient will need another negative covid test to come to [facility]. Covid test has been ordered and is pending. Clinical Education officer, museum (CSW) met with patient and her husband Cynthia Walker at bedside and made them aware of above. CSW will continue to follow and assist as needed.     Expected Discharge Plan: [place] Barriers to Discharge: Continued Medical Work up  Expected Discharge Plan and Services Expected Discharge Plan: [place] In-house Referral: Clinical Social Work   Post Acute Care Choice: [place] Living arrangements for the past 2 months: Single Family Home Expected Discharge Date: 06/30/19                                     Social Determinants of Health (SDOH) Interventions    Readmission Risk Interventions No flowsheet data found.

## 2019-06-30 NOTE — Progress Notes (Signed)
Physical Therapy Treatment Patient Details Name: DELAYNE SANZO MRN: [id number] DOB: [birth year] Today's Date: 06/30/2019    History of Present Illness Pt presented to ER secondary to mechanical fall with L hip pain. Pt was admitted for management of L intertrochanteric hip fracture and is now s/p IM nailing 06/27/19, WBAT.    PT Comments    Pt agreeable to PT; L hip pain mild at rest; moderate/severe with functional bed mobility. Pt participates with supine and seated exercises with increased verbal and tactile cueing. Max A for supine to/from sit bed mobility. Pt can demonstrate close supervised sitting; however, intermittently requires Min A for posterior lean primarily due to pt maintaining RLE up off floor despite verbal/tactile cues. Pt as a whole responds better with functional instructions versus individual sequence movement instructions, but overall demonstrates slow processing. Pt does initiate some movement, but unable to follow through completely (processing and pain both inhibiting). Education and support provided to spouse regarding treatment session; spouse given ideas on how to assist pt and encouraged to perform exercises with pt as well. Continue PT to progress strength, endurance, balance, to improve functional mobility.    Follow Up Recommendations  SNF     Equipment Recommendations  Wheelchair (measurements PT);Wheelchair cushion (measurements PT);3in1 (PT);Rolling walker with 5" wheels    Recommendations for Other Services       Precautions / Restrictions Precautions Precautions: Fall Restrictions Weight Bearing Restrictions: Yes LLE Weight Bearing: Weight bearing as tolerated    Mobility  Bed Mobility Overal bed mobility: Needs Assistance Bed Mobility: Supine to Sit;Sit to Supine     Supine to sit: Max assist;HOB elevated Sit to supine: Total assist;HOB elevated   General bed mobility comments: Demonstrates decreased movement with LEs with functional  movement due to pain; pt becomes rigid at times. Once seated pt despite all efforts would not keep RLE on the ground. Does demonstrate initiation of movement to scoot to equal balance on hip and scoot back into bed  Transfers                 General transfer comment: Not attempted  Ambulation/Gait                 Stairs             Wheelchair Mobility    Modified Rankin (Stroke Patients Only)       Balance Overall balance assessment: Needs assistance Sitting-balance support: Bilateral upper extremity supported;Feet supported Sitting balance-Leahy Scale: Fair(Fair -) Sitting balance - Comments: Requires intermittent Min A and heavy cueing                                    Cognition Arousal/Alertness: Awake/alert Behavior During Therapy: Anxious Overall Cognitive Status: History of cognitive impairments - at baseline(spouse states dementia/Alzheimers at baseline) Area of Impairment: Following commands;Problem solving                       Following Commands: Follows one step commands inconsistently     Problem Solving: Slow processing;Decreased initiation;Difficulty sequencing;Requires verbal cues;Requires tactile cues General Comments: Increased cueing throughout; does mildly better with functional instruction versus individual sequence movements      Exercises General Exercises - Lower Extremity Ankle Circles/Pumps: AROM;AAROM;Both;20 reps Quad Sets: Strengthening;Both;10 reps(requires tactile cues) Gluteal Sets: Strengthening;Both;10 reps Long Arc Quad: AAROM;Left;5 reps;Seated(AROM 10x R) Heel Slides: AAROM;Left;20 reps(AROM R) Hip ABduction/ADduction: AAROM;Left;20 reps  Straight Leg Raises: AAROM;Left;10 reps(AROM 5x on R) Hip Flexion/Marching: AAROM;Left;10 reps Other Exercises Other Exercises: 10 min discussion with spouse regarding methods for aiding pt in following commands and benefits of exercise repetition. Also  educated on other factors increasing pt confusion. Emotional support provided    General Comments        Pertinent Vitals/Pain Pain Assessment: Faces Faces Pain Scale: Hurts whole lot(With supint to/from sit; less so with exercise) Pain Location: L hip Pain Intervention(s): Monitored during session;Limited activity within patient's tolerance    Home Living                      Prior Function            PT Goals (current goals can now be found in the care plan section) Progress towards PT goals: Progressing toward goals(slowly)    Frequency    7X/week      PT Plan Current plan remains appropriate    Co-evaluation              AM-PAC PT "6 Clicks" Mobility   Outcome Measure  Help needed turning from your back to your side while in a flat bed without using bedrails?: A Lot Help needed moving from lying on your back to sitting on the side of a flat bed without using bedrails?: Total Help needed moving to and from a bed to a chair (including a wheelchair)?: Total Help needed standing up from a chair using your arms (e.g., wheelchair or bedside chair)?: Total Help needed to walk in hospital room?: Total Help needed climbing 3-5 steps with a railing? : Total 6 Click Score: 7    End of Session   Activity Tolerance: Patient limited by pain;Other (comment)(disorientation) Patient left: in bed;with call bell/phone within reach;with bed alarm set;with family/visitor present;with SCD's reapplied   PT Visit Diagnosis: Muscle weakness (generalized) (M62.81);Difficulty in walking, not elsewhere classified (R26.2);Pain Pain - Right/Left: Left Pain - part of body: Hip     Time: 1829-9371 PT Time Calculation (min) (ACUTE ONLY): 41 min  Charges:  $Therapeutic Exercise: 23-37 mins $Therapeutic Activity: 8-22 mins                      Larae Grooms, PTA 06/30/2019, 12:55 PM

## 2019-06-30 NOTE — Progress Notes (Addendum)
   Subjective: 3 Days Post-Op Procedure(s) (LRB): INTRAMEDULLARY (IM) NAIL INTERTROCHANTRIC (Left) Patient reports pain as 0 on 0-10 scale.   Patient is well, and has had no acute complaints or problems Patient was able to begin therapy yesterday.  Was able to ambulate from bed to chair. Plan is to go Rehab after hospital stay. no nausea and no vomiting Patient denies any chest pains or shortness of breath. Patient appears to be doing much better since morning.  Is able to communicate.  Does not appear to have any further agitation or confusion at this time Sleeping well. Voicing no complaints.  Objective: Vital signs in last 24 hours: Temp:  [97.9 F (36.6 C)] 97.9 F (36.6 C) (09/22 2325) Pulse Rate:  [80-91] 84 (09/22 2325) Resp:  [19] 19 (09/22 2325) BP: (116-144)/(58-79) 133/79 (09/22 2325) SpO2:  [95 %-99 %] 96 % (09/22 2325) well approximated incision Heels are non tender and elevated off the bed using rolled towels Intake/Output from previous day: 09/22 0701 - 09/23 0700 In: -  Out: 1050 [Urine:1050] Intake/Output this shift: Total I/O In: -  Out: 450 [Urine:450]  Recent Labs    06/27/19 1308 06/28/19 0505  HGB 11.1* 9.2*   Recent Labs    06/27/19 1308 06/28/19 0505  WBC 13.6* 12.7*  RBC 3.47* 2.90*  HCT 33.3* 28.3*  PLT 200 163   Recent Labs    06/27/19 1308 06/28/19 0505  NA 139 137  K 3.9 3.8  CL 105 107  CO2 26 23  BUN 16 18  CREATININE 0.77 0.71  GLUCOSE 132* 149*  CALCIUM 8.5* 7.8*   No results for input(s): LABPT, INR in the last 72 hours.  EXAM General - Patient is Alert, Appropriate and Oriented Extremity - Neurologically intact Neurovascular intact Sensation intact distally Intact pulses distally Dorsiflexion/Plantar flexion intact No cellulitis present Compartment soft Dressing - dressing C/D/I Motor Function - intact, moving foot and toes well on exam.    Past Medical History:  Diagnosis Date  . Breast cancer [facility]) 2009    Right breast CA with lumpectomy, radiation and chemo tx's.  Marland Kitchen Hypertension   . Personal history of chemotherapy   . Personal history of radiation therapy     Assessment/Plan: 3 Days Post-Op Procedure(s) (LRB): INTRAMEDULLARY (IM) NAIL INTERTROCHANTRIC (Left) Principal Problem:   Femur fracture, left (HCC) Active Problems:   Essential hypertension   Short-term memory loss   Femoral neck fracture (HCC)  Estimated body mass index is 22.31 kg/m as calculated from the following:   Height as of this encounter: 5\' 4"  (1.626 m).   Weight as of this encounter: 59 kg. Up with therapy Discharge to SNF when medically cleared  Labs: None DVT Prophylaxis - Lovenox, TED hose and SCD Weight-Bearing as tolerated to left leg Patient needs bowel movement Will need to follow-up in clinical clinic in 2 weeks Continue Lovenox for 2 weeks daily    Jacquel Mccamish R. [place]otte Cecil 06/30/2019, 6:29 AM

## 2019-07-01 LAB — CBC
HCT: 26.2 % — ABNORMAL LOW (ref 36.0–46.0)
Hemoglobin: 8.8 g/dL — ABNORMAL LOW (ref 12.0–15.0)
MCH: 31.7 pg (ref 26.0–34.0)
MCHC: 33.6 g/dL (ref 30.0–36.0)
MCV: 94.2 fL (ref 80.0–100.0)
Platelets: 240 10*3/uL (ref 150–400)
RBC: 2.78 MIL/uL — ABNORMAL LOW (ref 3.87–5.11)
RDW: 12 % (ref 11.5–15.5)
WBC: 7.8 10*3/uL (ref 4.0–10.5)
nRBC: 0 % (ref 0.0–0.2)

## 2019-07-01 MED ORDER — OXYCODONE HCL 5 MG PO TABS: 5.0000 mg | ORAL_TABLET | ORAL | 0 refills | Status: DC | PRN

## 2019-07-01 MED ORDER — CALCIUM CARBONATE-VITAMIN D 500-200 MG-UNIT PO TABS: 1.0000 | ORAL_TABLET | Freq: Two times a day (BID) | ORAL | Status: DC

## 2019-07-01 MED ORDER — CELECOXIB 200 MG PO CAPS: 200.0000 mg | ORAL_CAPSULE | Freq: Two times a day (BID) | ORAL | Status: DC

## 2019-07-01 MED ORDER — METOPROLOL TARTRATE 25 MG PO TABS: 12.5000 mg | ORAL_TABLET | Freq: Two times a day (BID) | ORAL | Status: DC

## 2019-07-01 MED ORDER — ENOXAPARIN SODIUM 30 MG/0.3ML ~~LOC~~ SOLN: 30.0000 mg | Freq: Two times a day (BID) | SUBCUTANEOUS | Status: DC

## 2019-07-01 NOTE — Discharge Instructions (Signed)
INSTRUCTIONS AFTER Surgery  o Remove items at home which could result in a fall. This includes throw rugs or furniture in walking pathways o ICE to the affected joint every three hours while awake for 30 minutes at a time, for at least the first 3-5 days, and then as needed for pain and swelling.  Continue to use ice for pain and swelling. You may notice swelling that will progress down to the foot and ankle.  This is normal after surgery.  Elevate your leg when you are not up walking on it.   o Continue to use the breathing machine you got in the hospital (incentive spirometer) which will help keep your temperature down.  It is common for your temperature to cycle up and down following surgery, especially at night when you are not up moving around and exerting yourself.  The breathing machine keeps your lungs expanded and your temperature down.   DIET:  As you were doing prior to hospitalization, we recommend a well-balanced diet.  DRESSING / WOUND CARE / SHOWERING  Dressing changes needed.  No showering.  Staples can be removed 2 weeks from surgery.  Apply Steri-Strips and a new bandage.  Able to shower 2 days after staples are removed.  ACTIVITY  o Increase activity slowly as tolerated, but follow the weight bearing instructions below.   o No driving for 6 weeks or until further direction given by your physician.  You cannot drive while taking narcotics.  o No lifting or carrying greater than 10 lbs. until further directed by your surgeon. o Avoid periods of inactivity such as sitting longer than an hour when not asleep. This helps prevent blood clots.  o You may return to work once you are authorized by your doctor.     WEIGHT BEARING  Weightbearing as tolerated on the left lower extremity   EXERCISES Gait training and ambulation.  CONSTIPATION  Constipation is defined medically as fewer than three stools per week and severe constipation as less than one stool per week.  Even if  you have a regular bowel pattern at home, your normal regimen is likely to be disrupted due to multiple reasons following surgery.  Combination of anesthesia, postoperative narcotics, change in appetite and fluid intake all can affect your bowels.   YOU MUST use at least one of the following options; they are listed in order of increasing strength to get the job done.  They are all available over the counter, and you may need to use some, POSSIBLY even all of these options:    Drink plenty of fluids (prune juice may be helpful) and high fiber foods Colace 100 mg by mouth twice a day  Senokot for constipation as directed and as needed Dulcolax (bisacodyl), take with full glass of water  Miralax (polyethylene glycol) once or twice a day as needed.  If you have tried all these things and are unable to have a bowel movement in the first 3-4 days after surgery call either your surgeon or your primary doctor.    If you experience loose stools or diarrhea, hold the medications until you stool forms back up.  If your symptoms do not get better within 1 week or if they get worse, check with your doctor.  If you experience "the worst abdominal pain ever" or develop nausea or vomiting, please contact the office immediately for further recommendations for treatment.   ITCHING:  If you experience itching with your medications, try taking only a single  pain pill, or even half a pain pill at a time.  You can also use Benadryl over the counter for itching or also to help with sleep.   TED HOSE STOCKINGS:  Use stockings on both legs until for at least 2 weeks or as directed by physician office. They may be removed at night for sleeping.  MEDICATIONS:  See your medication summary on the After Visit Summary that nursing will review with you.  You may have some home medications which will be placed on hold until you complete the course of blood thinner medication.  It is important for you to complete the blood  thinner medication as prescribed.  PRECAUTIONS:  If you experience chest pain or shortness of breath - call 911 immediately for transfer to the hospital emergency department.   If you develop a fever greater that 101 F, purulent drainage from wound, increased redness or drainage from wound, foul odor from the wound/dressing, or calf pain - CONTACT YOUR SURGEON.                                                   FOLLOW-UP APPOINTMENTS:  If you do not already have a post-op appointment, please call the office for an appointment to be seen by your surgeon.  Guidelines for how soon to be seen are listed in your After Visit Summary, but are typically between 1-4 weeks after surgery.  OTHER INSTRUCTIONS:     MAKE SURE YOU:   Understand these instructions.   Get help right away if you are not doing well or get worse.    Thank you for letting us be a part of your medical care team.  It is a privilege we respect greatly.  We hope these instructions will help you stay on track for a fast and full recovery!

## 2019-07-01 NOTE — Progress Notes (Signed)
Progress/Discharge note: Probation officer informed patient and husband that discharge paperwork was ready per MD order, both verbalized understanding.  I called report and spoke to [employer] at [employer].   Writer then called EMS for nonemergent transport.  Both patient and husband updated on expected EMS arrival.

## 2019-07-01 NOTE — TOC Transition Note (Signed)
Transition of Care [facility]) - CM/SW Discharge Note   Patient Details  Name: Cynthia Walker MRN: [id number] Date of Birth: September 17, 1935  [facility] CM/SW Contact:  Su Hilt, RN Phone Number: 07/01/2019, 9:07 AM   Clinical Narrative:    [facility] to [facility] today via EMS transport. Bedside nurse to call report to [phone] Spouse Richard made aware   Final next level of care: [place] Barriers to Discharge: Barriers Resolved   Patient Goals and CMS Choice Patient states their goals for this hospitalization and ongoing recovery are:: To home home. CMS Medicare.gov Compare Post Acute Care list provided to:: Patient Represenative (must comment)(Patient's husband Herbie [place].) Choice offered to / list presented to : Spouse  Discharge Placement                       Discharge Plan and Services In-house Referral: Clinical Social Work   Post Acute Care Choice: [place]                               Social Determinants of Health (SDOH) Interventions     Readmission Risk Interventions No flowsheet data found.

## 2019-07-01 NOTE — Discharge Summary (Signed)
[facility] [place] NAME: Cynthia Walker    MR#:  [id number]  DATE OF BIRTH:  [birth year]  DATE OF ADMISSION:  06/26/2019 ADMITTING PHYSICIAN: Lance Coon, MD  DATE OF DISCHARGE: 07/01/2019  PRIMARY CARE PHYSICIAN: Sofie Hartigan, MD    ADMISSION DIAGNOSIS:  Fall, initial encounter [id number]] Closed left hip fracture, initial encounter ([place]) [S72.002A]  DISCHARGE DIAGNOSIS:  Principal Problem:   Femur fracture, left ([place]) Active Problems:   Essential hypertension   Short-term memory loss   Femoral neck fracture (HCC)   SECONDARY DIAGNOSIS:   Past Medical History:  Diagnosis Date  . Breast cancer [facility]) 2009   Right breast CA with lumpectomy, radiation and chemo tx's.  Marland Kitchen Hypertension   . Personal history of chemotherapy   . Personal history of radiation therapy     HOSPITAL COURSE:   84 year old female with past medical history of breast cancer, hypertension, history of dementia presented to the hospital after mechanical fall and noted to have a left hip fracture.  1.  Status post fall and left hip fracture- This was a mechanical fall and pt. Had no prodromal symptoms prior to the fall. Pt. Was admitted and seen by Orthopedics and now is status post left hip intramedullary pinning postop day #4 today. - Tolerating PT well and pain is controlled on Oral Oxycodone as needed.  - PT recommended SNF and family in agreement and pt. Will be discharged there today.  - cont. Lovenox for DVT Prophylaxis for 2 weeks post-operatively and follow up with Ortho in 2 weeks.   2.  Leukocytosis- likely stress mediated from the fall and now normalized.  - no source of infection.   3.  History of breast cancer- currently in remission.  4.  Essential hypertension- pt. Was on no BP meds prior to coming to hospital. Started on low dose metoprolol and BP improved and she will be discharged on that.   DISCHARGE CONDITIONS:   Stable.    CONSULTS OBTAINED:  Treatment Team:  Dereck Leep, MD  DRUG ALLERGIES:   Allergies  Allergen Reactions  . Flu Virus Vaccine     Other reaction(s): Other (See Comments) Stomach cramps  . Latex Swelling  . Other     Other reaction(s): Unknown Allergy to eggs  . Procaine Other (See Comments)    weakness  . Soy Isoflavones     Other reaction(s): Other (See Comments) Soy causes legs to feel like rubber  . Penicillins Rash    Has patient had a PCN reaction causing immediate rash, facial/tongue/throat swelling, SOB or lightheadedness with hypotension: No Has patient had a PCN reaction causing severe rash involving mucus membranes or skin necrosis: No Has patient had a PCN reaction that required hospitalization: No Has patient had a PCN reaction occurring within the last 10 years: No If all of the above answers are "NO", then may proceed with Cephalosporin use.     DISCHARGE MEDICATIONS:   Allergies as of 07/01/2019      Reactions   Flu Virus Vaccine    Other reaction(s): Other (See Comments) Stomach cramps   Latex Swelling   Other    Other reaction(s): Unknown Allergy to eggs   Procaine Other (See Comments)   weakness   Soy Isoflavones    Other reaction(s): Other (See Comments) Soy causes legs to feel like rubber   Penicillins Rash   Has patient had a PCN reaction causing immediate rash, facial/tongue/throat swelling, SOB or  lightheadedness with hypotension: No Has patient had a PCN reaction causing severe rash involving mucus membranes or skin necrosis: No Has patient had a PCN reaction that required hospitalization: No Has patient had a PCN reaction occurring within the last 10 years: No If all of the above answers are "NO", then may proceed with Cephalosporin use.      Medication List    TAKE these medications   calcium-vitamin D 500-200 MG-UNIT tablet Commonly known as: OSCAL WITH D Take 1 tablet by mouth 2 (two) times daily.   celecoxib 200 MG  capsule Commonly known as: CELEBREX Take 1 capsule (200 mg total) by mouth 2 (two) times daily.   enoxaparin 30 MG/0.3ML injection Commonly known as: LOVENOX Inject 0.3 mLs (30 mg total) into the skin every 12 (twelve) hours for 14 days.   metoprolol tartrate 25 MG tablet Commonly known as: LOPRESSOR Take 0.5 tablets (12.5 mg total) by mouth 2 (two) times daily.   Multi-Vitamins Tabs Take by mouth.   oxyCODONE 5 MG immediate release tablet Commonly known as: Oxy IR/ROXICODONE Take 1 tablet (5 mg total) by mouth every 4 (four) hours as needed for moderate pain (pain score 4-6).         DISCHARGE INSTRUCTIONS:   DIET:  Regular diet  DISCHARGE CONDITION:  Stable  ACTIVITY:  Activity as tolerated  OXYGEN:  Home Oxygen: No.   Oxygen Delivery: room air  DISCHARGE LOCATION:  nursing home   If you experience worsening of your admission symptoms, develop shortness of breath, life threatening emergency, suicidal or homicidal thoughts you must seek medical attention immediately by calling 911 or calling your MD immediately  if symptoms less severe.  You Must read complete instructions/literature along with all the possible adverse reactions/side effects for all the Medicines you take and that have been prescribed to you. Take any new Medicines after you have completely understood and accpet all the possible adverse reactions/side effects.   Please note  You were cared for by a hospitalist during your hospital stay. If you have any questions about your discharge medications or the care you received while you were in the hospital after you are discharged, you can call the unit and asked to speak with the hospitalist on call if the hospitalist that took care of you is not available. Once you are discharged, your primary care physician will handle any further medical issues. Please note that NO REFILLS for any discharge medications will be authorized once you are discharged, as it  is imperative that you return to your primary care physician (or establish a relationship with a primary care physician if you do not have one) for your aftercare needs so that they can reassess your need for medications and monitor your lab values.     Today   No acute events overnight. Pain is well controlled.  Will d/c to SNF today.   VITAL SIGNS:  Blood pressure 110/71, pulse 89, temperature 98.4 F (36.9 C), temperature source Oral, resp. rate 17, height 5\' 4"  (1.626 m), weight 59 kg, SpO2 99 %.  I/O:    Intake/Output Summary (Last 24 hours) at 07/01/2019 0957 Last data filed at 07/01/2019 0900 Gross per 24 hour  Intake 50 ml  Output -  Net 50 ml    PHYSICAL EXAMINATION:   GENERAL:  84 y.o.-year-old patient lying in bed in no acute distress.  EYES: Pupils equal, round, reactive to light and accommodation. No scleral icterus. Extraocular muscles intact.  HEENT: Head atraumatic,  normocephalic. Oropharynx and nasopharynx clear.  NECK:  Supple, no jugular venous distention. No thyroid enlargement, no tenderness.  LUNGS: Normal breath sounds bilaterally, no wheezing, rales, rhonchi. No use of accessory muscles of respiration.  CARDIOVASCULAR: S1, S2 normal. No murmurs, rubs, or gallops.  ABDOMEN: Soft, nontender, nondistended. Bowel sounds present. No organomegaly or mass.  EXTREMITIES: No cyanosis, clubbing or edema b/l.  Left Hip honeycomb dressing in place with no acute drainage or bleeding noted. Some bruising noted around the surgical site.   NEUROLOGIC: Cranial nerves II through XII are intact. No focal Motor or sensory deficits b/l.   PSYCHIATRIC: The patient is alert and oriented x 3.  SKIN: No obvious rash, lesion, or ulcer.   DATA REVIEW:   CBC Recent Labs  Lab 07/01/19 0427  WBC 7.8  HGB 8.8*  HCT 26.2*  PLT 240    Chemistries  Recent Labs  Lab 06/26/19 2038  06/28/19 0505  NA 139   < > 137  K 3.8   < > 3.8  CL 104   < > 107  CO2 24   < > 23   GLUCOSE 143*   < > 149*  BUN 14   < > 18  CREATININE 0.74   < > 0.71  CALCIUM 9.1   < > 7.8*  AST 26  --   --   ALT 15  --   --   ALKPHOS 75  --   --   BILITOT 0.4  --   --    < > = values in this interval not displayed.    Cardiac Enzymes No results for input(s): TROPONINI in the last 168 hours.  Microbiology Results  Results for orders placed or performed during the hospital encounter of 06/26/19  SARS Coronavirus 2 [facility] order, Performed in [facility] hospital lab) Nasopharyngeal Nasopharyngeal Swab     Status: None   Collection Time: 06/26/19  9:31 PM   Specimen: Nasopharyngeal Swab  Result Value Ref Range Status   SARS Coronavirus 2 NEGATIVE NEGATIVE Final    Comment: (NOTE) If result is NEGATIVE SARS-CoV-2 target nucleic acids are NOT DETECTED. The SARS-CoV-2 RNA is generally detectable in upper and lower  respiratory specimens during the acute phase of infection. The lowest  concentration of SARS-CoV-2 viral copies this assay can detect is 250  copies / mL. A negative result does not preclude SARS-CoV-2 infection  and should not be used as the sole basis for treatment or other  patient management decisions.  A negative result may occur with  improper specimen collection / handling, submission of specimen other  than nasopharyngeal swab, presence of viral mutation(s) within the  areas targeted by this assay, and inadequate number of viral copies  (<250 copies / mL). A negative result must be combined with clinical  observations, patient history, and epidemiological information. If result is POSITIVE SARS-CoV-2 target nucleic acids are DETECTED. The SARS-CoV-2 RNA is generally detectable in upper and lower  respiratory specimens dur ing the acute phase of infection.  Positive  results are indicative of active infection with SARS-CoV-2.  Clinical  correlation with patient history and other diagnostic information is  necessary to determine patient infection status.   Positive results do  not rule out bacterial infection or co-infection with other viruses. If result is PRESUMPTIVE POSTIVE SARS-CoV-2 nucleic acids MAY BE PRESENT.   A presumptive positive result was obtained on the submitted specimen  and confirmed on repeat testing.  While 2019 novel coronavirus  (  SARS-CoV-2) nucleic acids may be present in the submitted sample  additional confirmatory testing may be necessary for epidemiological  and / or clinical management purposes  to differentiate between  SARS-CoV-2 and other Sarbecovirus currently known to infect humans.  If clinically indicated additional testing with an alternate test  methodology [phone]) is advised. The SARS-CoV-2 RNA is generally  detectable in upper and lower respiratory sp ecimens during the acute  phase of infection. The expected result is Negative. Fact Sheet for Patients:  StrictlyIdeas.no Fact Sheet for Healthcare Providers: BankingDealers.co.za This test is not yet approved or cleared by the [place] FDA and has been authorized for detection and/or diagnosis of SARS-CoV-2 by FDA under an Emergency Use Authorization (EUA).  This EUA will remain in effect (meaning this test can be used) for the duration of the COVID-19 declaration under Section 564(b)(1) of the Act, 21 U.S.C. section 360bbb-3(b)(1), unless the authorization is terminated or revoked sooner. Performed at [facility], [address], [place], [place] [address]   Surgical pcr screen     Status: None   Collection Time: 06/26/19 11:05 PM   Specimen: Nasal Mucosa; Nasal Swab  Result Value Ref Range Status   MRSA, PCR NEGATIVE NEGATIVE Final   Staphylococcus aureus NEGATIVE NEGATIVE Final    Comment: (NOTE) The Xpert SA Assay (FDA approved for NASAL specimens in patients 39 years of age and older), is one component of a comprehensive surveillance program. It is not intended to diagnose  infection nor to guide or monitor treatment. Performed at [facility], [place], [place] [address]   SARS CORONAVIRUS 2 (TAT 6-24 HRS) Nasopharyngeal Nasopharyngeal Swab     Status: None   Collection Time: 06/30/19 10:02 AM   Specimen: Nasopharyngeal Swab  Result Value Ref Range Status   SARS Coronavirus 2 NEGATIVE NEGATIVE Final    Comment: (NOTE) SARS-CoV-2 target nucleic acids are NOT DETECTED. The SARS-CoV-2 RNA is generally detectable in upper and lower respiratory specimens during the acute phase of infection. Negative results do not preclude SARS-CoV-2 infection, do not rule out co-infections with other pathogens, and should not be used as the sole basis for treatment or other patient management decisions. Negative results must be combined with clinical observations, patient history, and epidemiological information. The expected result is Negative. Fact Sheet for Patients: SugarRoll.be Fact Sheet for Healthcare Providers: https://www.woods-mathews.com/ This test is not yet approved or cleared by the [place] FDA and  has been authorized for detection and/or diagnosis of SARS-CoV-2 by FDA under an Emergency Use Authorization (EUA). This EUA will remain  in effect (meaning this test can be used) for the duration of the COVID-19 declaration under Section 56 4(b)(1) of the Act, 21 U.S.C. section 360bbb-3(b)(1), unless the authorization is terminated or revoked sooner. Performed at [facility], [place] [address], [place], [place] [address]     RADIOLOGY:  No results found.    Management plans discussed with the patient, family and they are in agreement.  CODE STATUS:     Code Status Orders  (From admission, onward)         Start     Ordered   06/26/19 2302  Full code  Continuous     06/26/19 2301       TOTAL TIME TAKING CARE OF THIS PATIENT: 40 minutes.    [name] M.D on  07/01/2019 at 9:57 AM  Between 7am to 6pm - Pager - [phone]  After 6pm go  to [email or web address] - Proofreader  [facility]  Office  [phone]  CC: Primary care physician; Sofie Hartigan, MD

## 2019-07-01 NOTE — Progress Notes (Signed)
Occupational Therapy Treatment Patient Details Name: Cynthia Walker MRN: [id number] DOB: 03-25-1935 Today's Date: 07/01/2019    History of present illness Pt presented to ER secondary to mechanical fall with L hip pain. Pt was admitted for management of L intertrochanteric hip fracture and is now s/p IM nailing 06/27/19, WBAT.   OT comments  Pt. and her husband were provided with education about A/E use for LE ADLs verbally, and through visual demonstration. Pt. was assisted with repositioning, pillowcase linen was changed, the bedside table was wiped clean,  and items were placed within her reach before leaving the room. Pt. continues to benefit from OT services for ADL training, continued A/E training, and pt. education about home modification, and DME. Pt. Continues to benefit from SNF level of care upon discharge with follow-up OT services.   Follow Up Recommendations  SNF    Equipment Recommendations  3 in 1 bedside commode    Recommendations for Other Services      Precautions / Restrictions Precautions Precautions: Fall Restrictions Weight Bearing Restrictions: Yes LLE Weight Bearing: Weight bearing as tolerated       Mobility Bed Mobility               General bed mobility comments: Deferred  Transfers                 General transfer comment: Deferred    Balance                                           ADL either performed or assessed with clinical judgement   ADL Overall ADL's : Needs assistance/impaired                                       General ADL Comments: Max for LB ADL tasks     Vision Baseline Vision/History: Wears glasses Wears Glasses: At all times Patient Visual Report: No change from baseline Vision Assessment?: No apparent visual deficits   Perception     Praxis      Cognition Arousal/Alertness: Awake/alert Behavior During Therapy: Flat affect Overall Cognitive Status: History of  cognitive impairments - at baseline Area of Impairment: Following commands;Problem solving                             Problem Solving: Slow processing;Decreased initiation;Difficulty sequencing;Requires verbal cues;Requires tactile cues          Exercises     Shoulder Instructions       General Comments      Pertinent Vitals/ Pain       Pain Assessment: Faces Faces Pain Scale: Hurts even more Pain Location: L hip Pain Descriptors / Indicators: Aching;Guarding Pain Intervention(s): Monitored during session;Limited activity within patient's tolerance  Home Living                                          Prior Functioning/Environment              Frequency  Min 2X/week        Progress Toward Goals  OT Goals(current goals can now be found in the care plan section)  Progress towards OT goals: Progressing toward goals  Acute Rehab OT Goals Patient Stated Goal: To return home OT Goal Formulation: With patient/family Time For Goal Achievement: 07/12/19 Potential to Achieve Goals: Good  Plan      Co-evaluation                 AM-PAC OT "6 Clicks" Daily Activity     Outcome Measure   Help from another person eating meals?: None Help from another person taking care of personal grooming?: A Little Help from another person toileting, which includes using toliet, bedpan, or urinal?: A Lot Help from another person bathing (including washing, rinsing, drying)?: A Lot Help from another person to put on and taking off regular upper body clothing?: A Lot Help from another person to put on and taking off regular lower body clothing?: A Lot 6 Click Score: 15    End of Session Equipment Utilized During Treatment: Gait belt;Rolling walker  OT Visit Diagnosis: Other abnormalities of gait and mobility (R26.89)   Activity Tolerance Patient tolerated treatment well   Patient Left     Nurse Communication Other (comment)         Time: 5277-8242 OT Time Calculation (min): 20 min  Charges: OT General Charges $OT Visit: 1 Visit OT Treatments $Self Care/Home Management : 8-22 mins  Harrel Carina, MS, OTR/L  Harrel Carina 07/01/2019, 11:20 AM

## 2019-07-01 NOTE — Progress Notes (Signed)
   Subjective: 4 Days Post-Op Procedure(s) (LRB): INTRAMEDULLARY (IM) NAIL INTERTROCHANTRIC (Left) Patient reports pain as 0 on 0-10 scale.   Patient is well, and has had no acute complaints or problems Patient was able to begin therapy yesterday.  Was able to ambulate from bed to chair. Plan is to go Rehab after hospital stay. no nausea and no vomiting Patient denies any chest pains or shortness of breath. Patient appears to be doing much better since morning.  Is able to communicate.  Does not appear to have any further agitation or confusion at this time Sleeping well. Voicing no complaints.  Objective: Vital signs in last 24 hours: Temp:  [97.9 F (36.6 C)-98.2 F (36.8 C)] 97.9 F (36.6 C) (09/23 2144) Pulse Rate:  [83-89] 85 (09/23 2144) Resp:  [15-19] 19 (09/23 2144) BP: (128-157)/(71-87) 128/87 (09/23 2144) SpO2:  [96 %-99 %] 98 % (09/23 2144) well approximated incision Heels are non tender and elevated off the bed using rolled towels Intake/Output from previous day: No intake/output data recorded. Intake/Output this shift: No intake/output data recorded.  Recent Labs    07/01/19 0427  HGB 8.8*   Recent Labs    07/01/19 0427  WBC 7.8  RBC 2.78*  HCT 26.2*  PLT 240   No results for input(s): NA, K, CL, CO2, BUN, CREATININE, GLUCOSE, CALCIUM in the last 72 hours. No results for input(s): LABPT, INR in the last 72 hours.  EXAM General - Patient is Alert, Appropriate and Oriented Extremity - Neurologically intact Neurovascular intact Sensation intact distally Intact pulses distally Dorsiflexion/Plantar flexion intact No cellulitis present Compartment soft Dressing - dressing C/D/I Motor Function - intact, moving foot and toes well on exam.    Past Medical History:  Diagnosis Date  . Breast cancer [facility]) 2009   Right breast CA with lumpectomy, radiation and chemo tx's.  Marland Kitchen Hypertension   . Personal history of chemotherapy   . Personal history of  radiation therapy     Assessment/Plan: 4 Days Post-Op Procedure(s) (LRB): INTRAMEDULLARY (IM) NAIL INTERTROCHANTRIC (Left) Principal Problem:   Femur fracture, left (HCC) Active Problems:   Essential hypertension   Short-term memory loss   Femoral neck fracture (HCC)  Estimated body mass index is 22.31 kg/m as calculated from the following:   Height as of this encounter: 5\' 4"  (1.626 m).   Weight as of this encounter: 59 kg. Up with therapy Discharge to SNF when medically cleared  Labs: None DVT Prophylaxis - Lovenox, TED hose and SCD Weight-Bearing as tolerated to left leg Patient needs bowel movement Will need to follow-up in clinical clinic in 2 weeks Continue Lovenox for 2 weeks daily    Reche Dixon, PA-C [place] 07/01/2019, 6:13 AM

## 2019-07-01 NOTE — Care Management Important Message (Signed)
Important Message  Patient Details  Name: Cynthia Walker MRN: [id number] Date of Birth: 07-Jul-1935   Medicare Important Message Given:  Yes     [name] 07/01/2019, 11:11 AM

## 2019-07-06 ENCOUNTER — Ambulatory Visit: Payer: Medicare HMO | Admitting: Radiation Oncology

## 2019-07-25 ENCOUNTER — Encounter: Payer: Self-pay | Admitting: Orthopedic Surgery

## 2019-08-04 ENCOUNTER — Other Ambulatory Visit: Payer: Self-pay

## 2019-08-05 ENCOUNTER — Ambulatory Visit: Payer: Medicare HMO | Attending: Radiation Oncology | Admitting: Radiation Oncology

## 2019-08-07 NOTE — Telephone Encounter (Signed)
ok 

## 2019-09-17 ENCOUNTER — Encounter: Payer: Self-pay | Admitting: *Deleted

## 2019-09-29 ENCOUNTER — Other Ambulatory Visit: Payer: Self-pay | Admitting: Orthopedic Surgery

## 2019-09-29 DIAGNOSIS — M79605 Pain in left leg: Secondary | ICD-10-CM

## 2019-09-29 DIAGNOSIS — Z8781 Personal history of (healed) traumatic fracture: Secondary | ICD-10-CM

## 2019-10-13 ENCOUNTER — Ambulatory Visit
Admission: RE | Admit: 2019-10-13 | Discharge: 2019-10-13 | Disposition: A | Discharge status: Home / Self Care | Payer: Medicare HMO | Source: Ambulatory Visit | Attending: Orthopedic Surgery | Admitting: Orthopedic Surgery

## 2019-10-13 ENCOUNTER — Other Ambulatory Visit: Payer: Self-pay

## 2019-10-13 DIAGNOSIS — M79605 Pain in left leg: Secondary | ICD-10-CM | POA: Diagnosis present

## 2019-10-13 DIAGNOSIS — Z8781 Personal history of (healed) traumatic fracture: Secondary | ICD-10-CM

## 2019-10-20 ENCOUNTER — Encounter: Payer: Self-pay | Admitting: *Deleted

## 2019-12-06 ENCOUNTER — Emergency Department: Payer: Medicare HMO

## 2019-12-06 ENCOUNTER — Emergency Department
Admission: EM | Admit: 2019-12-06 | Discharge: 2019-12-06 | Disposition: A | Discharge status: Home / Self Care | Payer: Medicare HMO | Attending: Emergency Medicine | Admitting: Emergency Medicine

## 2019-12-06 ENCOUNTER — Other Ambulatory Visit: Payer: Self-pay

## 2019-12-06 DIAGNOSIS — R1030 Lower abdominal pain, unspecified: Secondary | ICD-10-CM | POA: Insufficient documentation

## 2019-12-06 DIAGNOSIS — M25552 Pain in left hip: Secondary | ICD-10-CM | POA: Insufficient documentation

## 2019-12-06 DIAGNOSIS — Y929 Unspecified place or not applicable: Secondary | ICD-10-CM | POA: Insufficient documentation

## 2019-12-06 DIAGNOSIS — Z79899 Other long term (current) drug therapy: Secondary | ICD-10-CM | POA: Diagnosis not present

## 2019-12-06 DIAGNOSIS — R0789 Other chest pain: Secondary | ICD-10-CM | POA: Diagnosis not present

## 2019-12-06 DIAGNOSIS — I10 Essential (primary) hypertension: Secondary | ICD-10-CM | POA: Diagnosis not present

## 2019-12-06 DIAGNOSIS — M545 Low back pain: Secondary | ICD-10-CM | POA: Diagnosis not present

## 2019-12-06 DIAGNOSIS — F0390 Unspecified dementia without behavioral disturbance: Secondary | ICD-10-CM | POA: Insufficient documentation

## 2019-12-06 DIAGNOSIS — W19XXXA Unspecified fall, initial encounter: Secondary | ICD-10-CM | POA: Insufficient documentation

## 2019-12-06 DIAGNOSIS — Y990 Civilian activity done for income or pay: Secondary | ICD-10-CM | POA: Insufficient documentation

## 2019-12-06 DIAGNOSIS — M549 Dorsalgia, unspecified: Secondary | ICD-10-CM

## 2019-12-06 DIAGNOSIS — Y9389 Activity, other specified: Secondary | ICD-10-CM | POA: Insufficient documentation

## 2019-12-06 LAB — CBC WITH DIFFERENTIAL/PLATELET
Abs Immature Granulocytes: 0.04 10*3/uL (ref 0.00–0.07)
Basophils Absolute: 0 10*3/uL (ref 0.0–0.1)
Basophils Relative: 0 %
Eosinophils Absolute: 0.1 10*3/uL (ref 0.0–0.5)
Eosinophils Relative: 1 %
HCT: 38.9 % (ref 36.0–46.0)
Hemoglobin: 12.7 g/dL (ref 12.0–15.0)
Immature Granulocytes: 1 %
Lymphocytes Relative: 13 %
Lymphs Abs: 1.1 10*3/uL (ref 0.7–4.0)
MCH: 31.6 pg (ref 26.0–34.0)
MCHC: 32.6 g/dL (ref 30.0–36.0)
MCV: 96.8 fL (ref 80.0–100.0)
Monocytes Absolute: 0.6 10*3/uL (ref 0.1–1.0)
Monocytes Relative: 7 %
Neutro Abs: 6.9 10*3/uL (ref 1.7–7.7)
Neutrophils Relative %: 78 %
Platelets: 303 10*3/uL (ref 150–400)
RBC: 4.02 MIL/uL (ref 3.87–5.11)
RDW: 12.9 % (ref 11.5–15.5)
WBC: 8.9 10*3/uL (ref 4.0–10.5)
nRBC: 0 % (ref 0.0–0.2)

## 2019-12-06 LAB — COMPREHENSIVE METABOLIC PANEL
ALT: 11 U/L (ref 0–44)
AST: 16 U/L (ref 15–41)
Albumin: 3.7 g/dL (ref 3.5–5.0)
Alkaline Phosphatase: 99 U/L (ref 38–126)
Anion gap: 10 (ref 5–15)
BUN: 11 mg/dL (ref 8–23)
CO2: 26 mmol/L (ref 22–32)
Calcium: 9 mg/dL (ref 8.9–10.3)
Chloride: 105 mmol/L (ref 98–111)
Creatinine, Ser: 0.54 mg/dL (ref 0.44–1.00)
GFR calc Af Amer: >60 mL/min (ref >60)
GFR calc non Af Amer: >60 mL/min (ref >60)
Glucose, Bld: 97 mg/dL (ref 70–99)
Potassium: 4.3 mmol/L (ref 3.5–5.1)
Sodium: 141 mmol/L (ref 135–145)
Total Bilirubin: 0.6 mg/dL (ref 0.3–1.2)
Total Protein: 6.8 g/dL (ref 6.5–8.1)

## 2019-12-06 LAB — URINALYSIS, COMPLETE (UACMP) WITH MICROSCOPIC
Bacteria, UA: NONE SEEN
Bilirubin Urine: NEGATIVE
Glucose, UA: NEGATIVE mg/dL
Hgb urine dipstick: NEGATIVE
Ketones, ur: NEGATIVE mg/dL
Leukocytes,Ua: NEGATIVE
Nitrite: NEGATIVE
Protein, ur: NEGATIVE mg/dL
Specific Gravity, Urine: 1.006 (ref 1.005–1.030)
pH: 8 (ref 5.0–8.0)

## 2019-12-06 LAB — LIPASE, BLOOD: Lipase: 28 U/L (ref 11–51)

## 2019-12-06 MED ORDER — LIDOCAINE 5 % EX PTCH: 1.0000 | MEDICATED_PATCH | Freq: Two times a day (BID) | CUTANEOUS | 0 refills | Status: DC

## 2019-12-06 NOTE — ED Notes (Signed)
Pt taken to Xray.

## 2019-12-06 NOTE — ED Provider Notes (Signed)
[facility] Emergency Department Provider Note   ____________________________________________   First MD Initiated Contact with Patient 12/06/19 [id number]     (approximate)  I have reviewed the triage vital signs and the nursing notes.   HISTORY  Chief Complaint Back Pain    HPI Cynthia Walker is a 85 y.o. female with past medical history of dementia, hypertension, and breast cancer who presents to the ED for pain.  History is limited due to patient's baseline dementia.  Per EMS, she had been screaming out in pain when her husband was attempting to get her up off of the toilet.  Patient initially stated she did not remember this, but then admitted she remembered being in pain in her lower abdomen and left hip.  EMS had reported patient complained of pain in her upper back and left leg initially, but denied pain during transport.  Patient continues to deny any ongoing pain here in the ED, does not report any recent falls.  She does report some pain in her left hip and proximal thigh with movement of her left leg.  She denies any vomiting, diarrhea, dysuria, or hematuria.        Past Medical History:  Diagnosis Date  . Breast cancer [facility]) 2009   Right breast CA with lumpectomy, radiation and chemo tx's.  Marland Kitchen Hypertension   . Personal history of chemotherapy   . Personal history of radiation therapy     Patient Active Problem List   Diagnosis Date Noted  . Femoral neck fracture ([place]) 06/27/2019  . Femur fracture, left ([place]) 06/26/2019  . Right lower lobe pulmonary nodule 05/04/2019  . Syncope 09/27/2017  . Atypical chest pain 09/03/2017  . Essential hypertension 09/03/2017  . Short-term memory loss 09/03/2017  . Osteoporosis 04/02/2016  . Breast cancer, right ([place]) 04/06/2008    Past Surgical History:  Procedure Laterality Date  . BREAST BIOPSY Right 03/15/2008   invasive mammary carcinoma  . BREAST LUMPECTOMY Right 04/14/2008   Pathology revealed  a 1.1 cm grade I invasive ductal carcinoma. Margins were negative.   . COLONOSCOPY WITH PROPOFOL N/A 07/10/2015   Procedure: COLONOSCOPY WITH PROPOFOL;  Surgeon: Hulen Luster, MD;  Location: [facility] ENDOSCOPY;  Service: Gastroenterology;  Laterality: N/A;  . INTRAMEDULLARY (IM) NAIL INTERTROCHANTERIC Left 06/27/2019   Procedure: INTRAMEDULLARY (IM) NAIL INTERTROCHANTRIC;  Surgeon: Creig Hines, MD;  Location: [facility] ORS;  Service: Orthopedics;  Laterality: Left;  . TONSILLECTOMY     age of 85 years old    Prior to Admission medications   Medication Sig Start Date End Date Taking? Authorizing Provider  calcium-vitamin D (OSCAL WITH D) 500-200 MG-UNIT tablet Take 1 tablet by mouth 2 (two) times daily. 07/01/19   [name], MD  celecoxib (CELEBREX) 200 MG capsule Take 1 capsule (200 mg total) by mouth 2 (two) times daily. 07/01/19   [name], MD  enoxaparin (LOVENOX) 30 MG/0.3ML injection Inject 0.3 mLs (30 mg total) into the skin every 12 (twelve) hours for 14 days. 07/01/19 07/15/19  [name], MD  lidocaine (LIDODERM) 5 % Place 1 patch onto the skin every 12 (twelve) hours. Remove & Discard patch within 12 hours or as directed by MD 12/06/19 12/05/20  Blake Divine, MD  metoprolol tartrate (LOPRESSOR) 25 MG tablet Take 0.5 tablets (12.5 mg total) by mouth 2 (two) times daily. 07/01/19   [name], MD  Multiple Vitamin (MULTI-VITAMINS) TABS Take by mouth.    [provider]  oxyCODONE (OXY IR/ROXICODONE) 5 MG immediate release tablet Take 1 tablet (5 mg total) by mouth every 4 (four) hours as needed for moderate pain (pain score 4-6). 07/01/19   [name], MD    Allergies Flu virus vaccine, Latex, Other, Procaine, Soy isoflavones, and Penicillins  Family History  Problem Relation Age of Onset  . Cancer Brother        lung cancer  . Cancer Sister        not sure  . Breast cancer Sister   . Heart disease Father   . Hypertension Father     Social  History Social History   Tobacco Use  . Smoking status: Never Smoker  . Smokeless tobacco: Never Used  Substance Use Topics  . Alcohol use: No  . Drug use: No    Review of Systems  Constitutional: No fever/chills Eyes: No visual changes. ENT: No sore throat. Cardiovascular: Denies chest pain. Respiratory: Denies shortness of breath. Gastrointestinal: Positive for abdominal pain.  No nausea, no vomiting.  No diarrhea.  No constipation. Genitourinary: Negative for dysuria. Musculoskeletal: Negative for back pain.  Positive for left hip pain. Skin: Negative for rash. Neurological: Negative for headaches, focal weakness or numbness.  ____________________________________________   PHYSICAL EXAM:  VITAL SIGNS: ED Triage Vitals [12/06/19 0710]  Enc Vitals Group     BP (!) 156/86     Pulse Rate 83     Resp 18     Temp      Temp src      SpO2 97 %     Weight      Height      Head Circumference      Peak Flow      Pain Score      Pain Loc      Pain Edu?      Excl. in [place]?     Constitutional: Awake and alert. Eyes: Conjunctivae are normal. Head: Atraumatic. Nose: No congestion/rhinnorhea. Mouth/Throat: Mucous membranes are moist. Neck: Normal ROM Cardiovascular: Normal rate, regular rhythm. Grossly normal heart sounds. Respiratory: Normal respiratory effort.  No retractions. Lungs CTAB. Gastrointestinal: Soft and nontender. No distention. Genitourinary: deferred Musculoskeletal: No lower extremity tenderness nor edema.  Pain with range of motion at left hip but no pain with range of motion at left knee.  Range of motion intact to right lower extremity without discomfort. Neurologic:  Normal speech and language. No gross focal neurologic deficits are appreciated. Skin:  Skin is warm, dry and intact. No rash noted. Psychiatric: Mood and affect are normal. Speech and behavior are normal.  ____________________________________________   LABS (all labs ordered are  listed, but only abnormal results are displayed)  Labs Reviewed  URINALYSIS, COMPLETE (UACMP) WITH MICROSCOPIC - Abnormal; Notable for the following components:      Result Value   Color, Urine STRAW (*)    APPearance CLEAR (*)    All other components within normal limits  CBC WITH DIFFERENTIAL/PLATELET  COMPREHENSIVE METABOLIC PANEL  LIPASE, BLOOD     PROCEDURES  Procedure(s) performed (including Critical Care):  Procedures   ____________________________________________   INITIAL IMPRESSION / ASSESSMENT AND PLAN / ED COURSE       85 year old female with history of hypertension, dementia, and breast cancer presents to the ED after screaming out in pain to lower abdomen and left hip when husband was attempting to get her up off of the toilet.  She is calm and comfortable here in the ED, but does seem to  have some discomfort with range of motion at her left hip.  Given prior fracture to this area, we will further assess with x-ray.  While she has benign abdominal exam here in the ED, will screen labs given she complained of here previously, will also assess for UTI.  She is at her neurologic baseline with no focal deficits.  Patient's husband now at bedside, states she has fallen on multiple occasions recently, but has not hit her head and has otherwise been acting herself.  She had been complaining of back pain earlier today and on reevaluation has midline thoracic and lumbar spinal tenderness as well as left lateral chest wall tenderness.  X-ray imaging of chest and back is negative, x-rays of left hip also show appropriate placement of hardware with no acute changes.  Lab work is reassuring, no evidence of UTI.  Will prescribe Lidoderm patches for her chest wall discomfort and have patient follow-up with her PCP, otherwise return to the ED for new or worsening symptoms.  Husband agrees with plan.      ____________________________________________   FINAL CLINICAL IMPRESSION(S) /  ED DIAGNOSES  Final diagnoses:  Chest wall pain  Acute back pain, unspecified back location, unspecified back pain laterality  Fall, initial encounter     ED Discharge Orders         Ordered    lidocaine (LIDODERM) 5 %  Every 12 hours     12/06/19 1251           Note:  This document was prepared using Dragon voice recognition software and may include unintentional dictation errors.   Blake Divine, MD 12/06/19 1253

## 2019-12-06 NOTE — ED Triage Notes (Addendum)
Pt arrives from home with EMS. Pt's husband called EMS after patient was screaming out in pain getting off the toilet.  Per EMS pt's pain was upper back and left leg. Pt has hx of left fracture and dementia. Per husband the pt's dementia is progressing quickly. Pt is wheelchair bound.

## 2019-12-06 NOTE — TOC Initial Note (Signed)
Transition of Care [facility]) - Initial/Assessment Note    Patient Details  Name: Cynthia Walker MRN: [id number] Date of Birth: [birth year]  Transition of [facility]) CM/SW Contact:    Anselm Pancoast, RN Phone Number: 12/06/2019, 11:03 AM  Clinical Narrative:                 Spoke with spouse who confirmed he was primary caregiver for patient. States patient is having increased dementia and hallucinations however patient is manageable at home. Patient active with [facility] for PT and spouse is requesting aide services for short term assistance. RN CM requested from [employer].Patient receives frozen meals from the church and recently purchased handicap accessible van.   Expected Discharge Plan: [place]     Patient Goals and CMS Choice Patient states their goals for this hospitalization and ongoing recovery are:: get back home and get some aide services      Expected Discharge Plan and Services Expected Discharge Plan: [place]       Living arrangements for the past 2 months: Single Family Home                           HH Arranged: PT HH Agency: [place] Date [place]: 12/06/19 Time [place]: [id number] Representative spoke with at [place]: Malachy Mood  Prior Living Arrangements/Services Living arrangements for the past 2 months: [place] with:: Spouse Patient language and need for interpreter reviewed:: Yes Do you feel safe going back to the place where you live?: Yes      Need for Family Participation in Patient Care: Yes (Comment) Care giver support system in place?: Yes (comment) Current home services: DME, Home PT(walker, wheelchair, handicap van, grab bars, ramp) Criminal Activity/Legal Involvement Pertinent to Current Situation/Hospitalization: No - Comment as needed  Activities of Daily Living      Permission Sought/Granted   Permission granted to share information with :  Yes, Verbal Permission Granted     Permission granted to share info w AGENCY: [facility]        Emotional Assessment Appearance:: Appears stated age Attitude/Demeanor/Rapport: Engaged Affect (typically observed): Accepting Orientation: : Oriented to Self, Oriented to Place, Oriented to Situation Alcohol / Substance Use: Never Used Psych Involvement: No (comment)  Admission diagnosis:  Back/Leg Pain  Patient Active Problem List   Diagnosis Date Noted  . Femoral neck fracture ([place]) 06/27/2019  . Femur fracture, left ([place]) 06/26/2019  . Right lower lobe pulmonary nodule 05/04/2019  . Syncope 09/27/2017  . Atypical chest pain 09/03/2017  . Essential hypertension 09/03/2017  . Short-term memory loss 09/03/2017  . Osteoporosis 04/02/2016  . Breast cancer, right ([place]) 04/06/2008   PCP:  Sofie Hartigan, MD Pharmacy:   [id number] [place], [place] [address] Phone: [phone] Fax: [place], [place] [facility] [address] [place] [facility] [place] [address] Phone: [phone] Fax: [phone]     Social Determinants of Health (SDOH) Interventions    Readmission Risk Interventions No flowsheet data found.

## 2020-01-07 ENCOUNTER — Emergency Department: Payer: Medicare HMO

## 2020-01-07 ENCOUNTER — Other Ambulatory Visit: Payer: Self-pay

## 2020-01-07 ENCOUNTER — Encounter: Payer: Self-pay | Admitting: Emergency Medicine

## 2020-01-07 ENCOUNTER — Emergency Department
Admission: EM | Admit: 2020-01-07 | Discharge: 2020-01-07 | Disposition: A | Discharge status: Home / Self Care | Payer: Medicare HMO | Attending: Emergency Medicine | Admitting: Emergency Medicine

## 2020-01-07 DIAGNOSIS — Z91040 Latex allergy status: Secondary | ICD-10-CM | POA: Diagnosis not present

## 2020-01-07 DIAGNOSIS — J189 Pneumonia, unspecified organism: Secondary | ICD-10-CM | POA: Insufficient documentation

## 2020-01-07 DIAGNOSIS — Z853 Personal history of malignant neoplasm of breast: Secondary | ICD-10-CM | POA: Diagnosis not present

## 2020-01-07 DIAGNOSIS — Z79899 Other long term (current) drug therapy: Secondary | ICD-10-CM | POA: Insufficient documentation

## 2020-01-07 DIAGNOSIS — I10 Essential (primary) hypertension: Secondary | ICD-10-CM | POA: Insufficient documentation

## 2020-01-07 DIAGNOSIS — F0390 Unspecified dementia without behavioral disturbance: Secondary | ICD-10-CM | POA: Insufficient documentation

## 2020-01-07 DIAGNOSIS — N644 Mastodynia: Secondary | ICD-10-CM | POA: Insufficient documentation

## 2020-01-07 HISTORY — DX: Unspecified dementia, unspecified severity, without behavioral disturbance, psychotic disturbance, mood disturbance, and anxiety: F03.90

## 2020-01-07 LAB — BASIC METABOLIC PANEL
Anion gap: 8 (ref 5–15)
BUN: 10 mg/dL (ref 8–23)
CO2: 31 mmol/L (ref 22–32)
Calcium: 9.4 mg/dL (ref 8.9–10.3)
Chloride: 101 mmol/L (ref 98–111)
Creatinine, Ser: 0.65 mg/dL (ref 0.44–1.00)
GFR calc Af Amer: >60 mL/min (ref >60)
GFR calc non Af Amer: >60 mL/min (ref >60)
Glucose, Bld: 125 mg/dL — ABNORMAL HIGH (ref 70–99)
Potassium: 3.7 mmol/L (ref 3.5–5.1)
Sodium: 140 mmol/L (ref 135–145)

## 2020-01-07 LAB — CBC
HCT: 41.1 % (ref 36.0–46.0)
Hemoglobin: 13.6 g/dL (ref 12.0–15.0)
MCH: 32.2 pg (ref 26.0–34.0)
MCHC: 33.1 g/dL (ref 30.0–36.0)
MCV: 97.4 fL (ref 80.0–100.0)
Platelets: 393 10*3/uL (ref 150–400)
RBC: 4.22 MIL/uL (ref 3.87–5.11)
RDW: 12.2 % (ref 11.5–15.5)
WBC: 9.7 10*3/uL (ref 4.0–10.5)
nRBC: 0 % (ref 0.0–0.2)

## 2020-01-07 LAB — TROPONIN I (HIGH SENSITIVITY): Troponin I (High Sensitivity): 3 ng/L (ref <18)

## 2020-01-07 MED ORDER — TRAMADOL HCL 50 MG PO TABS: 50.0000 mg | ORAL_TABLET | Freq: Two times a day (BID) | ORAL | 0 refills | Status: AC

## 2020-01-07 MED ORDER — AZITHROMYCIN 500 MG PO TABS
500.0000 mg | ORAL_TABLET | Freq: Once | ORAL | Status: AC
  Administered 2020-01-07: 500 mg via ORAL
  Filled 2020-01-07: qty 1

## 2020-01-07 MED ORDER — SODIUM CHLORIDE 0.9% FLUSH: 3.0000 mL | Freq: Once | INTRAVENOUS | Status: DC

## 2020-01-07 MED ORDER — AZITHROMYCIN 250 MG PO TABS: 250.0000 mg | ORAL_TABLET | Freq: Every day | ORAL | 0 refills | Status: AC

## 2020-01-07 MED ORDER — TRAMADOL HCL 50 MG PO TABS
50.0000 mg | ORAL_TABLET | Freq: Once | ORAL | Status: AC
  Administered 2020-01-07: 17:00:00 50 mg via ORAL
  Filled 2020-01-07: qty 1

## 2020-01-07 NOTE — ED Triage Notes (Signed)
Pt to ER with husband with c/o right breast pain.  Husband states she had a lumpectomy several years ago and recently has begun to have pain in that area.  States it has been worse today.  Denies SHOB, n/v or other c/o at this time.

## 2020-01-07 NOTE — ED Notes (Signed)
See triage note   Husband states he noticed some soreness to right breast area for some time  Noticed today the area was more painful

## 2020-01-07 NOTE — ED Provider Notes (Signed)
[facility] Emergency Department Provider Note ____________________________________________  Time seen: 1512  I have reviewed the triage vital signs and the nursing notes.  HISTORY  Chief Complaint  Breast Pain  HPI Cynthia Walker is a 85 y.o. female presents to the ED accompanied by her husband, for evaluation of right greater than left breast pain.  Patient with a history of dementia, hypertension, and a right breast lumpectomy with subsequent radiation and chemotherapy  in 2009.  Patient is also about 91-month status post a ORIF for a left hip fracture follow mechanical fall.  Patient has been receiving home PT, but her husband is her primary caregiver.  He reports that she had complaints of pain to the right breast as he attempted to help her stand up out of the chair.  He describes he typically will occur around the waist and/or chest, and she will interlace her fingers around his neck.  She complained of pain to the right breast as he attempted to help her transition today.  She denies any fever, chills, or sweats.  She also denies any nausea, vomiting, or diarrhea.  He denies any change in her mental status, weight, or general state of health.  Past Medical History:  Diagnosis Date  . Breast cancer [facility] 2009   Right breast CA with lumpectomy, radiation and chemo tx's.  . Dementia ([place])   . Hypertension   . Personal history of chemotherapy   . Personal history of radiation therapy     Patient Active Problem List   Diagnosis Date Noted  . Femoral neck fracture ([place]) 06/27/2019  . Femur fracture, left ([place]) 06/26/2019  . Right lower lobe pulmonary nodule 05/04/2019  . Syncope 09/27/2017  . Atypical chest pain 09/03/2017  . Essential hypertension 09/03/2017  . Short-term memory loss 09/03/2017  . Osteoporosis 04/02/2016  . Breast cancer, right ([place]) 04/06/2008    Past Surgical History:  Procedure Laterality Date  . BREAST BIOPSY Right 03/15/2008    invasive mammary carcinoma  . BREAST LUMPECTOMY Right 04/14/2008   Pathology revealed a 1.1 cm grade I invasive ductal carcinoma. Margins were negative.   . COLONOSCOPY WITH PROPOFOL N/A 07/10/2015   Procedure: COLONOSCOPY WITH PROPOFOL;  Surgeon: Hulen Luster, MD;  Location: [facility] ENDOSCOPY;  Service: Gastroenterology;  Laterality: N/A;  . INTRAMEDULLARY (IM) NAIL INTERTROCHANTERIC Left 06/27/2019   Procedure: INTRAMEDULLARY (IM) NAIL INTERTROCHANTRIC;  Surgeon: Creig Hines, MD;  Location: [facility] ORS;  Service: Orthopedics;  Laterality: Left;  . TONSILLECTOMY     age of 85 years old    Prior to Admission medications   Medication Sig Start Date End Date Taking? Authorizing Provider  azithromycin (ZITHROMAX Z-PAK) 250 MG tablet Take 1 tablet (250 mg total) by mouth daily for 4 days. 01/08/20 01/12/20  Abdulrahim Siddiqi, Dannielle Karvonen, PA-C  calcium-vitamin D (OSCAL WITH D) 500-200 MG-UNIT tablet Take 1 tablet by mouth 2 (two) times daily. 07/01/19   [name], MD  celecoxib (CELEBREX) 200 MG capsule Take 1 capsule (200 mg total) by mouth 2 (two) times daily. 07/01/19   [name], MD  lidocaine (LIDODERM) 5 % Place 1 patch onto the skin every 12 (twelve) hours. Remove & Discard patch within 12 hours or as directed by MD 12/06/19 12/05/20  Blake Divine, MD  metoprolol tartrate (LOPRESSOR) 25 MG tablet Take 0.5 tablets (12.5 mg total) by mouth 2 (two) times daily. 07/01/19   [name], MD  Multiple Vitamin (MULTI-VITAMINS) TABS Take by mouth.  [provider]  traMADol (ULTRAM) 50 MG tablet Take 1 tablet (50 mg total) by mouth 2 (two) times daily for 5 days. 01/07/20 01/12/20  Jam[employer]acle [employer], Dannielle Karvonen, PA-C    Allergies Flu virus vaccine, Latex, Other, Procaine, Soy isoflavones, and Penicillins  Family History  Problem Relation Age of Onset  . Cancer Brother        lung cancer  . Cancer Sister        not sure  . Breast cancer Sister   . Heart disease Father   . Hypertension Father      Social History Social History   Tobacco Use  . Smoking status: Never Smoker  . Smokeless tobacco: Never Used  Substance Use Topics  . Alcohol use: No  . Drug use: No    Review of Systems  Constitutional: Negative for fever. Cardiovascular: Negative for chest pain. Respiratory: Negative for shortness of breath. Gastrointestinal: Negative for abdominal pain, vomiting and diarrhea. Genitourinary: Negative for dysuria. Musculoskeletal: Negative for back pain. Skin: Negative for rash. Breast pain R>L Neurological: Negative for headaches, focal weakness or numbness. ____________________________________________  PHYSICAL EXAM:  VITAL SIGNS: ED Triage Vitals  Enc Vitals Group     BP 01/07/20 1345 130/78     Pulse Rate 01/07/20 1345 96     Resp 01/07/20 1345 18     Temp 01/07/20 1345 97.7 F (36.5 C)     Temp Source 01/07/20 1345 Oral     SpO2 01/07/20 1345 100 %     Weight 01/07/20 1344 130 lb (59 kg)     Height 01/07/20 1344 5\' 3"  (1.6 m)     Head Circumference --      Peak Flow --      Pain Score 01/07/20 1657 3     Pain Loc --      Pain Edu? --      Excl. in [place]? --     Constitutional: Alert and oriented. Well appearing and in no distress. Head: Normocephalic and atraumatic. Eyes: Conjunctivae are normal. Normal extraocular movements Neck: Supple. No thyromegaly. Hematological/Lymphatic/Immunological: No cervical, supra-/infraclavicular, axillary lymphadenopathy. Cardiovascular: Normal rate, regular rhythm. Normal distal pulses. Respiratory: Normal respiratory effort. No wheezes/rales/rhonchi. Breast: right breast with stable lumpectomy scar to the outer-lower quadrant. No nipple retraction, dimpling, or peau d orange skin changes noted. No erythema, edema, or induration noted. No nipple discharge Tender to light touch over the lower right breast. Similar benign exam to the left breast. Tenderness to palp to the lower lateral left breast.  Gastrointestinal: Soft  and nontender. No distention. Musculoskeletal: Nontender with normal range of motion in all extremities.  Neurologic:  Normal gait without ataxia. Normal speech and language. No gross focal neurologic deficits are appreciated. Skin:  Skin is warm, dry and intact. No rash noted. Psychiatric: Mood and affect are normal. Patient exhibits appropriate insight and judgment. ____________________________________________   LABS (pertinent positives/negatives) Labs Reviewed  BASIC METABOLIC PANEL - Abnormal; Notable for the following components:      Result Value   Glucose, Bld 125 (*)    All other components within normal limits  CBC  TROPONIN I (HIGH SENSITIVITY)  TROPONIN I (HIGH SENSITIVITY)  ____________________________________________  EKG NSR 92  bpm PR Interval 138 ms 72 QRS Duration  No STEMI ____________________________________________   RADIOLOGY  CXR  IMPRESSION: Mild right mid lung field infiltrate. ____________________________________________  PROCEDURES  Azithromycin 500 mg PO Ultram 50 mg PO  Procedures ____________________________________________  INITIAL IMPRESSION / ASSESSMENT AND PLAN /  ED COURSE  Geriatric patient with ED evaluation of right greater than left breast pain.  Patient with exam that did not show any acute skin changes to the right or left breast.  There was no concerning nodularity, cystic findings, skin changes, or nipple discharge.  Patient was exquisitely tender to light touch over the breast bilaterally.  She did have an early infiltrate on the right middle lobe on the right.  Labs are otherwise reassuring without any signs of acute leukocytosis, anemia, or dehydration.  Patient will be treated empirically for a community-acquired pneumonia, and pain medicines are provided for the breast pain.  She is referred to her oncologist for further evaluation of breast pain.  Her husband was advised to call on Monday to schedule a screening mammogram or  ultrasound to further evaluate her complaint.  Cynthia Walker was evaluated in Emergency Department on 01/08/2020 for the symptoms described in the history of present illness. She was evaluated in the context of the global COVID-19 pandemic, which necessitated consideration that the patient might be at risk for infection with the SARS-CoV-2 virus that causes COVID-19. Institutional protocols and algorithms that pertain to the evaluation of patients at risk for COVID-19 are in a state of rapid change based on information released by regulatory bodies including the CDC and federal and state organizations. These policies and algorithms were followed during the patient's care in the ED.  I reviewed the patient's prescription history over the last 12 months in the multi-state controlled substances database(s) that includes [place], [place], [place], [place], [place], [place], [place], [place], [place], [place], [place], [place], [place], and [place].  Results were notable for no recent RX.  ____________________________________________  FINAL CLINICAL IMPRESSION(S) / ED DIAGNOSES  Final diagnoses:  Community acquired pneumonia of right middle lobe of lung  Breast pain      Callista Hoh, Dannielle Karvonen, PA-C 01/08/20 1804    Nance Pear, MD 01/08/20 1929

## 2020-01-07 NOTE — Discharge Instructions (Signed)
Your exam and labs are essentially normal at this time. Your XR did reveal a mild pneumonia on the right. We are treating your with antibiotics for the pneumonia, and pain medicine for the breast tenderness. You should call Dr. Baruch Gouty and follow-up with him for further evaluation. Return to the ED as needed. God bless you.

## 2020-01-10 ENCOUNTER — Telehealth: Payer: Self-pay | Admitting: *Deleted

## 2020-01-10 NOTE — Progress Notes (Signed)
[facility]  [address] [place], [place] [address] Phone: [phone]  Fax: [phone]   Clinic Day:  01/11/2020  Referring physician: Sofie Hartigan, MD  Chief Complaint: Cynthia Walker is a 85 y.o. female with stage IA right breast cancerand a right lower lobe lung nodule who is seen for reassessment.  HPI: The patient was last seen in the medical oncology clinic on 05/17/2019. At that time, she denied any concerns. We discussed consideration of Prolia for her osteoporosis. Labs were normal. CA27.29 was 21.2. She remained on calcium and vitamin D. Plan was for follow-up after SBRT to the enlarging nodule in the RLL (not amendable to biopsy).   She received SBRT to the RLL nodule from 05/31/2019 - 06/15/2019.  Patient was admitted to [facility] from 06/26/2019 - 07/01/2019 s/p mechanical fall and closed left hip fracture. She underwent left hip intramedullary pinning by Dr. Marry Guan on 06/27/2019.   She was seen in neurology on 08/10/2019 by Gayland Curry, PA. Her short term memory was getting worse. She was not taking medications as prescribed.  She was performing ADLs without assistance. Referral was sent to home health with palliative care for ADLs. Follow up planned in 6 months.   Patient was seen in orthopaedics on 11/30/2019 by Hampton Abbot, PA. Patient was using a walker to ambulate. She was improving slowly. She felt stronger. She was advised to continue exercises at home to improve ambulation.    Patient presented to the ER on 01/07/2020 with right breast pain. Pain increased with movement. She denied any fever, chills, or sweats.  She also denied any nausea, vomiting, or diarrhea.  Her husband denied any change in her mental status, weight, or general state of health. Labs were otherwise reassuring without any signs of acute leukocytosis, anemia, or dehydration. CXR revealed a mild right mid lung field infiltrate. She was treated empirically  for a community-acquired pneumonia, and pain medicines were provided for the breast pain.  She was referred to her oncologist for further evaluation of breast pain.   During the interim, she has felt "pretty good". Patient and husband will continue to consider Prolia. Her husband notes if he tries to touch her or move her and help her get dressed the right breast pain increases. The pain is located the site of lumpectomy. He notes she had a breast exam in the ER and it was unremarkable. She has had right breast pain x 2 months along with bilateral rib pain. [name] reports about 10 falls since the last visit. She has had 4 recent falls where she slips up or falls off the couch. He denies that she had any injuries with her recent falls. He notes some weight loss but she has not been weighed since she broke her left hip. He reports that he is always with her. Both received their last COVID-19 vaccine x 2 weeks ago. She has no side effects.    Past Medical History:  Diagnosis Date  . Breast cancer [facility]) 2009   Right breast CA with lumpectomy, radiation and chemo tx's.  . Dementia ([place])   . Hypertension   . Personal history of chemotherapy   . Personal history of radiation therapy     Past Surgical History:  Procedure Laterality Date  . BREAST BIOPSY Right 03/15/2008   invasive mammary carcinoma  . BREAST LUMPECTOMY Right 04/14/2008   Pathology revealed a 1.1 cm grade I invasive ductal carcinoma. Margins were negative.   . COLONOSCOPY  WITH PROPOFOL N/A 07/10/2015   Procedure: COLONOSCOPY WITH PROPOFOL;  Surgeon: Hulen Luster, MD;  Location: [facility] ENDOSCOPY;  Service: Gastroenterology;  Laterality: N/A;  . INTRAMEDULLARY (IM) NAIL INTERTROCHANTERIC Left 06/27/2019   Procedure: INTRAMEDULLARY (IM) NAIL INTERTROCHANTRIC;  Surgeon: Creig Hines, MD;  Location: [facility] ORS;  Service: Orthopedics;  Laterality: Left;  . TONSILLECTOMY     age of 85 years old    Family History  Problem Relation Age of Onset   . Cancer Brother        lung cancer  . Cancer Sister        not sure  . Breast cancer Sister   . Heart disease Father   . Hypertension Father     Social History:  reports that she has never smoked. She has never used smokeless tobacco. She reports that she does not drink alcohol or use drugs. She has never smoked and has not had significant exposure to secondhand smoke.Her husband's name is [name]. She lives in [place]. The patient is accompanied by [name] today.  Allergies:  Allergies  Allergen Reactions  . Flu Virus Vaccine     Other reaction(s): Other (See Comments) Stomach cramps  . Latex Swelling  . Other     Other reaction(s): Unknown Allergy to eggs  . Procaine Other (See Comments)    weakness  . Soy Isoflavones     Other reaction(s): Other (See Comments) Soy causes legs to feel like rubber  . Penicillins Rash    Has patient had a PCN reaction causing immediate rash, facial/tongue/throat swelling, SOB or lightheadedness with hypotension: No Has patient had a PCN reaction causing severe rash involving mucus membranes or skin necrosis: No Has patient had a PCN reaction that required hospitalization: No Has patient had a PCN reaction occurring within the last 10 years: No If all of the above answers are "NO", then may proceed with Cephalosporin use.     Current Medications: Current Outpatient Medications  Medication Sig Dispense Refill  . azithromycin (ZITHROMAX Z-PAK) 250 MG tablet Take 1 tablet (250 mg total) by mouth daily for 4 days. 4 each 0  . calcium-vitamin D (OSCAL WITH D) 500-200 MG-UNIT tablet Take 1 tablet by mouth 2 (two) times daily.    . celecoxib (CELEBREX) 200 MG capsule Take 1 capsule (200 mg total) by mouth 2 (two) times daily. (Patient not taking: Reported on 01/11/2020)    . lidocaine (LIDODERM) 5 % Place 1 patch onto the skin every 12 (twelve) hours. Remove & Discard patch within 12 hours or as directed by MD (Patient not taking: Reported on 01/11/2020) 10  patch 0  . metoprolol tartrate (LOPRESSOR) 25 MG tablet Take 0.5 tablets (12.5 mg total) by mouth 2 (two) times daily. (Patient not taking: Reported on 01/11/2020)    . Multiple Vitamin (MULTI-VITAMINS) TABS Take by mouth.    . traMADol (ULTRAM) 50 MG tablet Take 1 tablet (50 mg total) by mouth 2 (two) times daily for 5 days. (Patient not taking: Reported on 01/11/2020) 10 tablet 0   No current facility-administered medications for this visit.    Review of Systems  Constitutional: Positive for weight loss. Negative for chills, diaphoresis, fever and malaise/fatigue.       Feels "pretty good". Husband assist with ADLs.  HENT: Negative for congestion, ear discharge, ear pain, hearing loss, nosebleeds, sinus pain and sore throat.   Eyes: Negative for blurred vision.  Respiratory: Negative for cough, hemoptysis, sputum production and shortness of breath.   Cardiovascular:  Positive for chest pain (right breast; right ribs>left ribs). Negative for palpitations and leg swelling.  Gastrointestinal: Negative for abdominal pain, blood in stool, constipation, diarrhea, heartburn, melena, nausea and vomiting.  Genitourinary: Negative for dysuria, frequency, hematuria and urgency.  Musculoskeletal: Positive for falls. Negative for back pain, myalgias and neck pain.       Tenderness in chest wall and bilateral breasts.  Skin: Negative for itching and rash.  Neurological: Negative for dizziness, tingling, sensory change, weakness and headaches.  Endo/Heme/Allergies: Does not bruise/bleed easily.  Psychiatric/Behavioral: Positive for memory loss. Negative for depression. The patient is not nervous/anxious and does not have insomnia.   All other systems reviewed and are negative.   Performance status (ECOG): 2-3  Vitals Blood pressure 131/72, pulse 87, temperature 97.6 F (36.4 C), temperature source Tympanic, resp. rate 18, height '5\' 3"'$  (1.6 m), SpO2 100 %.   Physical Exam  Constitutional: She is  oriented to person, place, and time. She appears well-developed and well-nourished. No distress.  Patient seated in wheelchair.  HENT:  Head: Normocephalic and atraumatic.  Mouth/Throat: Oropharynx is clear and moist. No oropharyngeal exudate.  Curly short gray hair. Mask.  Eyes: Pupils are equal, round, and reactive to light. Conjunctivae and EOM are normal. No scleral icterus.  Brown/hazel eyes.  Cardiovascular: Normal rate, regular rhythm and normal heart sounds.  No murmur heard. Pulmonary/Chest: Effort normal and breath sounds normal. No respiratory distress. She has no wheezes. She has no rales. She exhibits no tenderness. Right breast exhibits tenderness. Right breast exhibits no inverted nipple, no mass, no nipple discharge and no skin change. Left breast exhibits tenderness. Left breast exhibits no inverted nipple, no mass, no nipple discharge and no skin change.  Abdominal: Soft. Bowel sounds are normal. She exhibits no distension and no mass. There is abdominal tenderness (right rib > left rib). There is no rebound and no guarding.  Musculoskeletal:        General: No tenderness or edema. Normal range of motion.     Cervical back: Normal range of motion and neck supple.  Lymphadenopathy:    She has no cervical adenopathy.    She has no axillary adenopathy.       Right: No supraclavicular adenopathy present.       Left: No supraclavicular adenopathy present.  Neurological: She is alert and oriented to person, place, and time.  Skin: Skin is warm and dry. She is not diaphoretic.  Psychiatric: She has a normal mood and affect. Her behavior is normal. Judgment and thought content normal.  Nursing note and vitals reviewed.   No visits with results within 3 Day(s) from this visit.  Latest known visit with results is:  Admission on 01/07/2020, Discharged on 01/07/2020  Component Date Value Ref Range Status  . Sodium 01/07/2020 140  135 - 145 mmol/L Final  . Potassium 01/07/2020 3.7   3.5 - 5.1 mmol/L Final  . Chloride 01/07/2020 101  98 - 111 mmol/L Final  . CO2 01/07/2020 31  22 - 32 mmol/L Final  . Glucose, Bld 01/07/2020 125* 70 - 99 mg/dL Final   Glucose reference range applies only to samples taken after fasting for at least 8 hours.  . BUN 01/07/2020 10  8 - 23 mg/dL Final  . Creatinine, Ser 01/07/2020 0.65  0.44 - 1.00 mg/dL Final  . Calcium 01/07/2020 9.4  8.9 - 10.3 mg/dL Final  . GFR calc non Af Amer 01/07/2020 >60  >60 mL/min Final  . GFR calc  Af Amer 01/07/2020 >60  >60 mL/min Final  . Anion gap 01/07/2020 8  5 - 15 Final   Performed at [facility], [address] [place], [place] [address]  . WBC 01/07/2020 9.7  4.0 - 10.5 K/uL Final  . RBC 01/07/2020 4.22  3.87 - 5.11 MIL/uL Final  . Hemoglobin 01/07/2020 13.6  12.0 - 15.0 g/dL Final  . HCT 01/07/2020 41.1  36.0 - 46.0 % Final  . MCV 01/07/2020 97.4  80.0 - 100.0 fL Final  . MCH 01/07/2020 32.2  26.0 - 34.0 pg Final  . MCHC 01/07/2020 33.1  30.0 - 36.0 g/dL Final  . RDW 01/07/2020 12.2  11.5 - 15.5 % Final  . Platelets 01/07/2020 393  150 - 400 K/uL Final  . nRBC 01/07/2020 0.0  0.0 - 0.2 % Final   Performed at [facility], [address], [place], [place] [address]  . Troponin I (High Sensitivity) 01/07/2020 3  <18 ng/L Final   Comment: (NOTE) Elevated high sensitivity troponin I (hsTnI) values and significant  changes across serial measurements may suggest ACS but many other  chronic and acute conditions are known to elevate hsTnI results.  Refer to the "Links" section for chest pain algorithms and additional  guidance. Performed at [facility], [address] [place], [place] [address]     Assessment:  AKIBA MELFI is a 85 y.o. female with stage IA right breast cancers/p lumpectomy and sentinel lymph node biopsy in 04/2008. Pathology revealed a 1.1 cm grade I invasive ductal carcinoma. Margins were negative. There was no lymphovascular invasion. Stage  was T1cN0M0. Tumor was ER and PR positive and HER-2 negative (2+ by IHC but FISH -). Oncotype DX testing revealed an intermediate score of 21.  She received adjuvant chemotherapy. Chemotherapy was truncated secondary to an allergic reaction to Taxotere. She received TC1 followed by weekly Taxol and Cytoxan(no records available). She received radiation. She received 5 years of Femara(10/26/2008 - 10/2013).  CA27.29 has been followed: 16.4 on 06/20/2011, 23.4 on 01/16/2012, 20.7 on 07/23/2012, 23 on 01/11/2013, 22.5 on 11/25/2013, 20.9 on 04/23/2017, 18.2 on 05/06/2018, 21.2 on 05/05/2019, and 16.2 on 01/11/2020.  Bilateral screening mammogramon 04/12/2019 revealed no evidence of malignancy.  Chest CTon 07/06/2020revealedno acute right rib fracture or evidence of acute traumatic injury to the thorax. There were pulmonary nodules, the majority of which are stable, however11 x 8 mm right lower lobe pulmonary nodule hadincreased in size from 2015 and hadirregular margins.   PET scanon 05/03/2019 revealed a11 x 8 mm nodule along the anterolateral right lung base, slowly progressive from 2015, concerning for indolent primary bronchogenicneoplasm.There was mild hypermetabolism in the right hilar region,favored to be reactive. No findings suspicious for metastatic disease. There was hypermetabolism involving the right 4th, 7th, and 10th ribs.In the setting of trauma, this appearance is suspicious for healing nondisplaced fractures.  She received SBRT to the RLL nodule from 05/31/2019 - 06/15/2019.  Bone density on 01/27/2014 revealed osteoporosiswith a T score of -3.6 in the AP spine and -3.7 in the right femur. Bone density on 04/02/2016 revealed a T score of -3.4 in the AP spine (L1-L4) and -3.2 in the right femur (improved). Bone density on 04/07/2018 revealed osteoporosis with a T score of -3.4 in the right femur.She stopped Fosamax in 2017.  She received her last COVID-19  vaccine 2 weeks ago.  Symptomatically, she describes breast pain x 2 months.  She appears to have rib pain.  She has  taken several falls.  Exam is unremarkable.  Plan: 1.   Labs today:  LFTs, CA27.29. 2.   Stage IA right breast cancer Clinically, she appears to be doing well.               She is 11 years s/p initial diagnosis.               Exam reveals no evidence of recurrent disease.               CA27.29 is 16.2 (normal).               Schedule mammogram on 04/12/2020.    Reassurance provided.  Continue surveillance. 3. Right breast pain  Pain appears to be localized to ribs.  Alkaline phosphatase is 96 (normal).  No breast lesions noted. 4.   Right lower lobe nodule She was felt to have a stage I indolent primary lung cancer. She completed SBRT to the right lower lobe nodule on 06/15/2019.             Schedule chest CT on 01/14/2020   Continue to monitor. 5. Osteoporosis Continue calcium and vitamin D Consider Prolia in the future 6.   RN to call radiation oncology re: last appointment and scan with Dr. Baruch Gouty 7.   RTC after chest CT for MD assessment (video visit).  I discussed the assessment and treatment plan with the patient.  The patient was provided an opportunity to ask questions and all were answered.  The patient agreed with the plan and demonstrated an understanding of the instructions.  The patient was advised to call back if the symptoms worsen or if the condition fails to improve as anticipated.  I provided 21 minutes of face-to-face time during this encounter and > 50% was spent counseling as documented under my assessment and plan. An additional 10 minutes were spent reviewing her chart (Epic and Care Everywhere) including notes, labs, and imaging studies.    Lequita Asal, MD, PhD    01/11/2020, 10:29 AM  I, Selena Batten, am acting as scribe for [employer]. Mike Gip, MD, PhD.  I,  Tangie Stay C. Mike Gip, MD, have reviewed the above documentation for accuracy and completeness, and I agree with the above.

## 2020-01-10 NOTE — Telephone Encounter (Signed)
  Please call and schedule a follow-up appointment in clinic.   Lequita Asal, MD

## 2020-01-10 NOTE — Telephone Encounter (Signed)
Husband called reporting that patient was in ER 01/06/20 with chest pain and right breast pain and CXR showed a shadow and she is being treated for pneumonia and he was told to follow up with Dr Baruch Gouty and our office regarding this. She has no follow up appointments scheduled and was a No Show for her last appointment with Dr Baruch Gouty. Please return his call [phone]

## 2020-01-11 ENCOUNTER — Other Ambulatory Visit: Payer: Self-pay

## 2020-01-11 ENCOUNTER — Inpatient Hospital Stay: Payer: Medicare HMO | Attending: Hematology and Oncology | Admitting: Hematology and Oncology

## 2020-01-11 ENCOUNTER — Inpatient Hospital Stay: Payer: Medicare HMO

## 2020-01-11 ENCOUNTER — Encounter: Payer: Self-pay | Admitting: Hematology and Oncology

## 2020-01-11 VITALS — BP 131/72 | HR 87 | Temp 97.6°F | Resp 18 | Ht 63.0 in

## 2020-01-11 DIAGNOSIS — M810 Age-related osteoporosis without current pathological fracture: Secondary | ICD-10-CM

## 2020-01-11 DIAGNOSIS — C50911 Malignant neoplasm of unspecified site of right female breast: Secondary | ICD-10-CM

## 2020-01-11 DIAGNOSIS — Z853 Personal history of malignant neoplasm of breast: Secondary | ICD-10-CM | POA: Insufficient documentation

## 2020-01-11 DIAGNOSIS — C3431 Malignant neoplasm of lower lobe, right bronchus or lung: Secondary | ICD-10-CM | POA: Diagnosis present

## 2020-01-11 DIAGNOSIS — N644 Mastodynia: Secondary | ICD-10-CM | POA: Diagnosis not present

## 2020-01-11 DIAGNOSIS — R911 Solitary pulmonary nodule: Secondary | ICD-10-CM

## 2020-01-11 DIAGNOSIS — Z170 Estrogen receptor positive status [ER+]: Secondary | ICD-10-CM

## 2020-01-11 LAB — HEPATIC FUNCTION PANEL
ALT: 13 U/L (ref 0–44)
AST: 18 U/L (ref 15–41)
Albumin: 3.6 g/dL (ref 3.5–5.0)
Alkaline Phosphatase: 96 U/L (ref 38–126)
Bilirubin, Direct: <0.1 mg/dL (ref 0.0–0.2)
Total Bilirubin: 0.3 mg/dL (ref 0.3–1.2)
Total Protein: 7 g/dL (ref 6.5–8.1)

## 2020-01-11 NOTE — Progress Notes (Signed)
The patient husband c/o increase pain with tender to touch/ right more than left x 1 month

## 2020-01-11 NOTE — Patient Instructions (Signed)
Denosumab injection What is this medicine? DENOSUMAB (den oh sue mab) slows bone breakdown. Prolia is used to treat osteoporosis in women after menopause and in men, and in people who are taking corticosteroids for 6 months or more. Xgeva is used to treat a high calcium level due to cancer and to prevent bone fractures and other bone problems caused by multiple myeloma or cancer bone metastases. Xgeva is also used to treat giant cell tumor of the bone. This medicine may be used for other purposes; ask your health care provider or pharmacist if you have questions. COMMON BRAND NAME(S): Prolia, XGEVA What should I tell my health care provider before I take this medicine? They need to know if you have any of these conditions:  dental disease  having surgery or tooth extraction  infection  kidney disease  low levels of calcium or Vitamin D in the blood  malnutrition  on hemodialysis  skin conditions or sensitivity  thyroid or parathyroid disease  an unusual reaction to denosumab, other medicines, foods, dyes, or preservatives  pregnant or trying to get pregnant  breast-feeding How should I use this medicine? This medicine is for injection under the skin. It is given by a health care professional in a hospital or clinic setting. A special MedGuide will be given to you before each treatment. Be sure to read this information carefully each time. For Prolia, talk to your pediatrician regarding the use of this medicine in children. Special care may be needed. For Xgeva, talk to your pediatrician regarding the use of this medicine in children. While this drug may be prescribed for children as young as 13 years for selected conditions, precautions do apply. Overdosage: If you think you have taken too much of this medicine contact a poison control center or emergency room at once. NOTE: This medicine is only for you. Do not share this medicine with others. What if I miss a dose? It is  important not to miss your dose. Call your doctor or health care professional if you are unable to keep an appointment. What may interact with this medicine? Do not take this medicine with any of the following medications:  other medicines containing denosumab This medicine may also interact with the following medications:  medicines that lower your chance of fighting infection  steroid medicines like prednisone or cortisone This list may not describe all possible interactions. Give your health care provider a list of all the medicines, herbs, non-prescription drugs, or dietary supplements you use. Also tell them if you smoke, drink alcohol, or use illegal drugs. Some items may interact with your medicine. What should I watch for while using this medicine? Visit your doctor or health care professional for regular checks on your progress. Your doctor or health care professional may order blood tests and other tests to see how you are doing. Call your doctor or health care professional for advice if you get a fever, chills or sore throat, or other symptoms of a cold or flu. Do not treat yourself. This drug may decrease your body's ability to fight infection. Try to avoid being around people who are sick. You should make sure you get enough calcium and vitamin D while you are taking this medicine, unless your doctor tells you not to. Discuss the foods you eat and the vitamins you take with your health care professional. See your dentist regularly. Brush and floss your teeth as directed. Before you have any dental work done, tell your dentist you are   receiving this medicine. Do not become pregnant while taking this medicine or for 5 months after stopping it. Talk with your doctor or health care professional about your birth control options while taking this medicine. Women should inform their doctor if they wish to become pregnant or think they might be pregnant. There is a potential for serious side  effects to an unborn child. Talk to your health care professional or pharmacist for more information. What side effects may I notice from receiving this medicine? Side effects that you should report to your doctor or health care professional as soon as possible:  allergic reactions like skin rash, itching or hives, swelling of the face, lips, or tongue  bone pain  breathing problems  dizziness  jaw pain, especially after dental work  redness, blistering, peeling of the skin  signs and symptoms of infection like fever or chills; cough; sore throat; pain or trouble passing urine  signs of low calcium like fast heartbeat, muscle cramps or muscle pain; pain, tingling, numbness in the hands or feet; seizures  unusual bleeding or bruising  unusually weak or tired Side effects that usually do not require medical attention (report to your doctor or health care professional if they continue or are bothersome):  constipation  diarrhea  headache  joint pain  loss of appetite  muscle pain  runny nose  tiredness  upset stomach This list may not describe all possible side effects. Call your doctor for medical advice about side effects. You may report side effects to FDA at 1-800-FDA-1088. Where should I keep my medicine? This medicine is only given in a clinic, doctor's office, or other health care setting and will not be stored at home. NOTE: This sheet is a summary. It may not cover all possible information. If you have questions about this medicine, talk to your doctor, pharmacist, or health care provider.  2020 Elsevier/Gold Standard (2018-01-30 16:10:44)

## 2020-01-13 LAB — CANCER ANTIGEN 27.29: CA 27.29: 16.2 U/mL (ref 0.0–38.6)

## 2020-01-13 NOTE — Progress Notes (Incomplete)
[facility]  [address] [place], [place] [address] Phone: [phone]  Fax: [phone]   Telemedicine Office Visit:  01/17/2020  Referring physician: Sofie Hartigan, MD  I connected with Cynthia Walker on 01/17/2020 at 4:08 PM by videoconferencing and verified that I was speaking with the correct person using 2 identifiers.  The patient was at *** home.  I discussed the limitations, risk, security and privacy concerns of performing an evaluation and management service by videoconferencing and the availability of in person appointments.  I also discussed with the patient that there may be a patient responsible charge related to this service.  The patient expressed understanding and agreed to proceed.   Chief Complaint: Cynthia Walker is a 85 y.o. female with stage IA right breast cancerand a right lower lobe lung nodule who is seen for a review of interval chest CT and discussion regarding direction of therapy.   HPI: The patient was last seen in the medical oncology clinic on 01/11/2020. At that time, she felt "pretty good".  She notes breast pain with movement.  Pain was at the side of her lumpectomy site.  She had several falls.  Exam revealed apparent rib tenderness.  LFTs were normal. CA 27.29 was 16.2. We discussed plans for a chest CT.   Chest CT on 01/14/2020 revealed a decreased nodularity in the RIGHT lower lobe following SBRT. There was new focus of peripheral consolidation with air bronchograms in the RIGHT middle lobe was favored a focus of infection. Recommended short-term CT follow-up (1-3 months).   During the interim, the patient ***    Past Medical History:  Diagnosis Date  . Breast cancer [facility]) 2009   Right breast CA with lumpectomy, radiation and chemo tx's.  . Dementia ([place])   . Hypertension   . Personal history of chemotherapy   . Personal history of radiation therapy     Past Surgical History:  Procedure  Laterality Date  . BREAST BIOPSY Right 03/15/2008   invasive mammary carcinoma  . BREAST LUMPECTOMY Right 04/14/2008   Pathology revealed a 1.1 cm grade I invasive ductal carcinoma. Margins were negative.   . COLONOSCOPY WITH PROPOFOL N/A 07/10/2015   Procedure: COLONOSCOPY WITH PROPOFOL;  Surgeon: Hulen Luster, MD;  Location: [facility] ENDOSCOPY;  Service: Gastroenterology;  Laterality: N/A;  . INTRAMEDULLARY (IM) NAIL INTERTROCHANTERIC Left 06/27/2019   Procedure: INTRAMEDULLARY (IM) NAIL INTERTROCHANTRIC;  Surgeon: Creig Hines, MD;  Location: [facility] ORS;  Service: Orthopedics;  Laterality: Left;  . TONSILLECTOMY     age of 85 years old    Family History  Problem Relation Age of Onset  . Cancer Brother        lung cancer  . Cancer Sister        not sure  . Breast cancer Sister   . Heart disease Father   . Hypertension Father     Social History:  reports that she has never smoked. She has never used smokeless tobacco. She reports that she does not drink alcohol or use drugs. She has never smoked and has not had significant exposure to secondhand smoke.Her husband's name is [name]. She lives in [place]. The patient is accompanied by Cynthia Walker today.  Participants in the patient's visit and their role in the encounter included the patient, Cynthia Walker, and Cynthia Walker, CMA, today.  The intake visit was provided by Cynthia Walker, CMA.  Allergies:  Allergies  Allergen Reactions  . Flu Virus Vaccine  Other reaction(s): Other (See Comments) Stomach cramps  . Latex Swelling  . Other     Other reaction(s): Unknown Allergy to eggs  . Procaine Other (See Comments)    weakness  . Soy Isoflavones     Other reaction(s): Other (See Comments) Soy causes legs to feel like rubber  . Penicillins Rash    Has patient had a PCN reaction causing immediate rash, facial/tongue/throat swelling, SOB or lightheadedness with hypotension: No Has patient had a PCN reaction causing severe rash involving mucus  membranes or skin necrosis: No Has patient had a PCN reaction that required hospitalization: No Has patient had a PCN reaction occurring within the last 10 years: No If all of the above answers are "NO", then may proceed with Cephalosporin use.     Current Medications: Current Outpatient Medications  Medication Sig Dispense Refill  . calcium-vitamin D (OSCAL WITH D) 500-200 MG-UNIT tablet Take 1 tablet by mouth 2 (two) times daily.    . Multiple Vitamin (MULTI-VITAMINS) TABS Take by mouth.    . celecoxib (CELEBREX) 200 MG capsule Take 1 capsule (200 mg total) by mouth 2 (two) times daily. (Patient not taking: Reported on 01/11/2020)    . lidocaine (LIDODERM) 5 % Place 1 patch onto the skin every 12 (twelve) hours. Remove & Discard patch within 12 hours or as directed by MD (Patient not taking: Reported on 01/11/2020) 10 patch 0  . metoprolol tartrate (LOPRESSOR) 25 MG tablet Take 0.5 tablets (12.5 mg total) by mouth 2 (two) times daily. (Patient not taking: Reported on 01/11/2020)     No current facility-administered medications for this visit.     Review of Systems  Constitutional: Positive for weight loss. Negative for chills, diaphoresis, fever and malaise/fatigue.       Feels "pretty good". Husband assist with ADLs.  HENT: Negative for congestion, ear discharge, ear pain, hearing loss, nosebleeds, sinus pain and sore throat.   Eyes: Negative for blurred vision.  Respiratory: Negative for cough, hemoptysis, sputum production and shortness of breath.   Cardiovascular: Positive for chest pain (right breast; right ribs > left ribs; chest wall and bilateral breasts). Negative for palpitations and leg swelling.  Gastrointestinal: Negative for abdominal pain, blood in stool, constipation, diarrhea, heartburn, melena, nausea and vomiting.  Genitourinary: Negative for dysuria, frequency, hematuria and urgency.  Musculoskeletal: Positive for falls. Negative for back pain, joint pain, myalgias and  neck pain.  Skin: Negative for itching and rash.  Neurological: Negative for dizziness, tingling, sensory change, weakness and headaches.  Endo/Heme/Allergies: Does not bruise/bleed easily.  Psychiatric/Behavioral: Positive for memory loss. Negative for depression. The patient is not nervous/anxious and does not have insomnia.   All other systems reviewed and are negative.   Performance status (ECOG): 2-3  Physical Exam  Constitutional: She is oriented to person, place, and time. She appears well-developed and well-nourished. No distress.  HENT:  Head: Normocephalic and atraumatic.  Curly short hair. Mask.  Eyes: Conjunctivae and EOM are normal. No scleral icterus.  Brown/hazel eyes.  Neurological: She is alert and oriented to person, place, and time.  Skin: She is not diaphoretic.  Psychiatric: She has a normal mood and affect. Her behavior is normal. Judgment and thought content normal.  Nursing note reviewed.    No visits with results within 3 Day(s) from this visit.  Latest known visit with results is:  Appointment on 01/11/2020  Component Date Value Ref Range Status  . CA 27.29 01/11/2020 16.2  0.0 - 38.6 U/mL Final  Comment: (NOTE) Siemens Centaur Immunochemiluminometric Methodology [facility]) Values obtained with different assay methods or kits cannot be used interchangeably. Results cannot be interpreted as absolute evidence of the presence or absence of malignant disease. Performed At: [facility] [place], [place] [id number] Rush Farmer MD [id number]   . Total Protein 01/11/2020 7.0  6.5 - 8.1 g/dL Final  . Albumin 01/11/2020 3.6  3.5 - 5.0 g/dL Final  . AST 01/11/2020 18  15 - 41 U/L Final  . ALT 01/11/2020 13  0 - 44 U/L Final  . Alkaline Phosphatase 01/11/2020 96  38 - 126 U/L Final  . Total Bilirubin 01/11/2020 0.3  0.3 - 1.2 mg/dL Final  . Bilirubin, Direct 01/11/2020 <0.1  0.0 - 0.2 mg/dL Final  . Indirect Bilirubin 01/11/2020 NOT  CALCULATED  0.3 - 0.9 mg/dL Final   Performed at [facility], [address], [place], [place] [address]    Assessment:  ALAINAH PHANG is a 85 y.o. female with stage IA right breast cancers/p lumpectomy and sentinel lymph node biopsy in 04/2008. Pathology revealed a 1.1 cm grade I invasive ductal carcinoma. Margins were negative. There was no lymphovascular invasion. Stage was T1cN0M0. Tumor was ER and PR positive and HER-2 negative (2+ by IHC but FISH -). Oncotype DX testing revealed an intermediate score of 21.  She received adjuvant chemotherapy. Chemotherapy was truncated secondary to an allergic reaction to Taxotere. She received TC1 followed by weekly Taxol and Cytoxan(no records available). She received radiation. She received 5 years of Femara(10/26/2008 - 10/2013).  CA27.29 has been followed: 16.4 on 06/20/2011, 23.4 on 01/16/2012, 20.7 on 07/23/2012, 23 on 01/11/2013, 22.5 on 11/25/2013, 20.9 on 04/23/2017, 18.2 on 05/06/2018, and 21.2 on 05/05/2019.  Bilateral screening mammogramon 04/12/2019 revealed no evidence of malignancy.  Chest CTon 07/06/2020revealedno acute right rib fracture or evidence of acute traumatic injury to the thorax. There were pulmonary nodules, the majority of which are stable, however11 x 8 mm right lower lobe pulmonary nodule hadincreased in size from 2015 and hadirregular margins.   PET scanon 05/03/2019 revealed a11 x 8 mm nodule along the anterolateral right lung base, slowly progressive from 2015, concerning for indolent primary bronchogenicneoplasm.There was mild hypermetabolism in the right hilar region,favored to be reactive. No findings suspicious for metastatic disease. There was hypermetabolism involving the right 4th, 7th, and 10th ribs.In the setting of trauma, this appearance is suspicious for healing nondisplaced fractures.  She received SBRT to the RLL nodule from 05/31/2019 - 06/15/2019.  Bone  density on 01/27/2014 revealed osteoporosiswith a T score of -3.6 in the AP spine and -3.7 in the right femur. Bone density on 04/02/2016 revealed a T score of -3.4 in the AP spine (L1-L4) and -3.2 in the right femur (improved). Bone density on 04/07/2018 revealed osteoporosis with a T score of -3.4 in the right femur.She stopped Fosamax in 2017.  Symptomatically, ***  Plan: 1.   Review chest CT.  2.   Stage IA right breast cancer Clinically, she is doing well. She is13 yearss/pinitial diagnosis.  Exam has revealed no evidence of recurrent disease.  CA27.29 was normal on 05/05/2019. Continuesurveillance.  3. Right breast pain  4.   Right lower lobe nodule Review tumor board discussions. Review consult with Dr. Baruch Gouty. Biopsyfor the 1.1 cm right lung base lesion is felt problematic. Etiology is felt secondary to a stage I indolent primary lung cancer. She isagreeable to SBRT. Radiationto begin on 05/31/2019. 5. Osteoporosis Continue calcium and vitamin  D ConsiderProlia in the future. 6.Patient to call 1st week of December forMDfollow-up after 3 month chest CTs/pradiation (ordered by Dr Baruch Gouty).     I discussed the assessment and treatment plan with the patient.  The patient was provided an opportunity to ask questions and all were answered.  The patient agreed with the plan and demonstrated an understanding of the instructions.  The patient was advised to call back or seek an in person evaluation if the symptoms worsen or if the condition fails to improve as anticipated.  I provided *** minutes (4:08 PM - X:XX) of face-to-face video visit time during this this encounter and > 50% was spent counseling as documented under my assessment and plan.  I provided these services from the [facility] office  ***.   Nolon Stalls, MD, PhD  01/17/2020, 4:08 PM  I, Selena Batten, am acting as scribe for [employer]. Mike Gip, MD, PhD.  {Add scribe attestation statement}

## 2020-01-14 ENCOUNTER — Other Ambulatory Visit: Payer: Self-pay

## 2020-01-14 ENCOUNTER — Ambulatory Visit
Admission: RE | Admit: 2020-01-14 | Discharge: 2020-01-14 | Disposition: A | Discharge status: Home / Self Care | Payer: Medicare HMO | Source: Ambulatory Visit | Attending: Hematology and Oncology | Admitting: Hematology and Oncology

## 2020-01-14 DIAGNOSIS — R911 Solitary pulmonary nodule: Secondary | ICD-10-CM | POA: Diagnosis present

## 2020-01-14 DIAGNOSIS — C50911 Malignant neoplasm of unspecified site of right female breast: Secondary | ICD-10-CM | POA: Diagnosis not present

## 2020-01-14 DIAGNOSIS — Z170 Estrogen receptor positive status [ER+]: Secondary | ICD-10-CM | POA: Insufficient documentation

## 2020-01-14 DIAGNOSIS — N644 Mastodynia: Secondary | ICD-10-CM | POA: Diagnosis present

## 2020-01-17 ENCOUNTER — Encounter: Payer: Self-pay | Admitting: Hematology and Oncology

## 2020-01-17 ENCOUNTER — Inpatient Hospital Stay: Payer: Medicare HMO | Admitting: Hematology and Oncology

## 2020-01-17 DIAGNOSIS — R911 Solitary pulmonary nodule: Secondary | ICD-10-CM

## 2020-01-17 DIAGNOSIS — M810 Age-related osteoporosis without current pathological fracture: Secondary | ICD-10-CM

## 2020-01-17 DIAGNOSIS — C50911 Malignant neoplasm of unspecified site of right female breast: Secondary | ICD-10-CM

## 2020-01-17 DIAGNOSIS — R0789 Other chest pain: Secondary | ICD-10-CM

## 2020-01-17 NOTE — Progress Notes (Unsigned)
No new changes noted today. The patient name and DOB has been verified by phone today. 

## 2020-01-18 NOTE — Progress Notes (Signed)
[facility]  [address] [place], [place] [address] Phone: [phone]  Fax: [phone]   Office Visit:  01/19/2020  Referring physician: Sofie Hartigan, MD  Chief Complaint: Cyn[place] Walker is a 85 y.o. female with stage IA right breast cancerand a right lower lobe lung nodule who is seen for a review of interval chest CT and discussion regarding direction of therapy.   HPI: The patient was last seen in the medical oncology clinic on 01/11/2020. At that time, she felt "pretty good".  She notes breast pain with movement.  Pain was at the side of her lumpectomy site.  She had several falls.  Exam revealed apparent rib tenderness.  LFTs were normal. CA 27.29 was 16.2. We discussed plans for a chest CT.   Chest CT on 01/14/2020 revealed a decreased nodularity in the RIGHT lower lobe following SBRT. There was new focus of peripheral consolidation with air bronchograms in the RIGHT middle lobe was favored a focus of infection. Recommended short-term CT follow-up (1-3 months).   Symptomatically, the patient feels good today. She does not feel bothered by anything today. Her breast pain is improving. Her husband says he doesn't notice anything on her left breast. She has no issues with choking on food. Her husband notes she is seeing Dr. Baruch Gouty on Friday 01/21/2020. When she was seen in the ER on 01/07/2020 for the pain in her breast, she was told she may have beginning stages of pneumonia since her chest x-ray revealed mild right mid lung field infiltrate. She was prescribed azithromycin and took it once a day. Her husband said she has been having an intermittent cough. She feels cold.    Past Medical History:  Diagnosis Date  . Breast cancer [facility]) 2009   Right breast CA with lumpectomy, radiation and chemo tx's.  . Dementia ([place])   . Hypertension   . Personal history of chemotherapy   . Personal history of radiation therapy     Past Surgical  History:  Procedure Laterality Date  . BREAST BIOPSY Right 03/15/2008   invasive mammary carcinoma  . BREAST LUMPECTOMY Right 04/14/2008   Pathology revealed a 1.1 cm grade I invasive ductal carcinoma. Margins were negative.   . COLONOSCOPY WITH PROPOFOL N/A 07/10/2015   Procedure: COLONOSCOPY WITH PROPOFOL;  Surgeon: Hulen Luster, MD;  Location: [facility] ENDOSCOPY;  Service: Gastroenterology;  Laterality: N/A;  . INTRAMEDULLARY (IM) NAIL INTERTROCHANTERIC Left 06/27/2019   Procedure: INTRAMEDULLARY (IM) NAIL INTERTROCHANTRIC;  Surgeon: Creig Hines, MD;  Location: [facility] ORS;  Service: Orthopedics;  Laterality: Left;  . TONSILLECTOMY     age of 85 years old    Family History  Problem Relation Age of Onset  . Cancer Brother        lung cancer  . Cancer Sister        not sure  . Breast cancer Sister   . Heart disease Father   . Hypertension Father     Social History:  reports that she has never smoked. She has never used smokeless tobacco. She reports that she does not drink alcohol or use drugs. She has never smoked and has not had significant exposure to secondhand smoke.Her husband's name is [name]. She lives in [place]. The patient is accompanied by her husband today.  Allergies:  Allergies  Allergen Reactions  . Flu Virus Vaccine     Other reaction(s): Other (See Comments) Stomach cramps  . Latex Swelling  . Other  Other reaction(s): Unknown Allergy to eggs  . Procaine Other (See Comments)    weakness  . Soy Isoflavones     Other reaction(s): Other (See Comments) Soy causes legs to feel like rubber  . Penicillins Rash    Has patient had a PCN reaction causing immediate rash, facial/tongue/throat swelling, SOB or lightheadedness with hypotension: No Has patient had a PCN reaction causing severe rash involving mucus membranes or skin necrosis: No Has patient had a PCN reaction that required hospitalization: No Has patient had a PCN reaction occurring within the last 10  years: No If all of the above answers are "NO", then may proceed with Cephalosporin use.     Current Medications: Current Outpatient Medications  Medication Sig Dispense Refill  . calcium-vitamin D (OSCAL WITH D) 500-200 MG-UNIT tablet Take 1 tablet by mouth 2 (two) times daily.    . celecoxib (CELEBREX) 200 MG capsule Take 1 capsule (200 mg total) by mouth 2 (two) times daily. (Patient not taking: Reported on 01/11/2020)    . lidocaine (LIDODERM) 5 % Place 1 patch onto the skin every 12 (twelve) hours. Remove & Discard patch within 12 hours or as directed by MD (Patient not taking: Reported on 01/11/2020) 10 patch 0  . metoprolol tartrate (LOPRESSOR) 25 MG tablet Take 0.5 tablets (12.5 mg total) by mouth 2 (two) times daily. (Patient not taking: Reported on 01/11/2020)    . Multiple Vitamin (MULTI-VITAMINS) TABS Take by mouth.     No current facility-administered medications for this visit.    Review of Systems  Constitutional: Positive for weight loss. Negative for chills, diaphoresis, fever and malaise/fatigue.       Feels "pretty good". Husband assist with ADLs.  HENT: Negative for congestion, ear discharge, ear pain, hearing loss, nosebleeds, sinus pain and sore throat.   Eyes: Negative for blurred vision.  Respiratory: Positive for cough (intermittent). Negative for hemoptysis, sputum production and shortness of breath.   Cardiovascular: Negative for chest pain (right breast; right ribs > left ribs; chest wall and bilateral breasts), palpitations and leg swelling.  Gastrointestinal: Negative for abdominal pain, blood in stool, constipation, diarrhea, heartburn, melena, nausea and vomiting.  Genitourinary: Negative for dysuria, frequency, hematuria and urgency.  Musculoskeletal: Negative for back pain, falls, joint pain, myalgias and neck pain.  Skin: Negative for itching and rash.  Neurological: Negative for dizziness, tingling, sensory change, weakness and headaches.    Endo/Heme/Allergies: Does not bruise/bleed easily.  Psychiatric/Behavioral: Positive for memory loss. Negative for depression. The patient is not nervous/anxious and does not have insomnia.   All other systems reviewed and are negative.   Performance status (ECOG): 3  Physical Exam Vitals and nursing note reviewed.  Constitutional:      Appearance: Normal appearance. She is well-developed and well-nourished. She is not diaphoretic.     Interventions: Face mask in place.     Comments: Patient sitting in a wheelchair in no acute distress.   HENT:     Head: Normocephalic and atraumatic.     Right Ear: Hearing normal.     Left Ear: Hearing normal.     Mouth/Throat:     Mouth: Oropharynx is clear and moist and mucous membranes are normal. No oral lesions.     Pharynx: No oropharyngeal exudate.      Comments: Curly short hair. Mask. Eyes:     General: No scleral icterus.       Right eye: No discharge.        Left eye: No  discharge.     Extraocular Movements: EOM normal.     Conjunctiva/sclera: Conjunctivae normal.     Pupils: Pupils are equal, round, and reactive to light.     Comments: Brown/hazel eyes.  Neck:     Vascular: No JVD.  Cardiovascular:     Rate and Rhythm: Normal rate and regular rhythm.     Heart sounds: Normal heart sounds. No murmur heard.  No friction rub. No gallop.   Pulmonary:     Effort: Pulmonary effort is normal.     Breath sounds: Normal breath sounds. No wheezing, rhonchi or rales.  Abdominal:     General: Bowel sounds are normal.     Palpations: Abdomen is soft. There is no hepatosplenomegaly or mass.     Tenderness: There is no abdominal tenderness. There is no CVA tenderness, guarding or rebound.  Musculoskeletal:        General: No tenderness or edema. Normal range of motion.     Cervical back: Normal range of motion and neck supple.  Lymphadenopathy:     Head:     Right side of head: No preauricular, posterior auricular or occipital  adenopathy.     Left side of head: No preauricular, posterior auricular or occipital adenopathy.     Cervical: No cervical adenopathy.     Right cervical: No superficial cervical adenopathy.    Upper Body:  No axillary adenopathy present.    Right upper body: No supraclavicular adenopathy.     Left upper body: No supraclavicular adenopathy.     Lower Body: No right inguinal adenopathy. No left inguinal adenopathy.  Skin:    General: Skin is warm, dry and intact.     Coloration: Skin is not pale.     Findings: No bruising, erythema, lesion or rash.  Neurological:     Mental Status: She is alert and oriented to person, place, and time.  Psychiatric:        Mood and Affect: Mood and affect normal.        Behavior: Behavior normal.        Thought Content: Thought content normal.        Judgment: Judgment normal.     No visits with results within 3 Day(s) from this visit.  Latest known visit with results is:  Appointment on 01/11/2020  Component Date Value Ref Range Status  . CA 27.29 01/11/2020 16.2  0.0 - 38.6 U/mL Final   Comment: (NOTE) Siemens Centaur Immunochemiluminometric Methodology [facility]) Values obtained with different assay methods or kits cannot be used interchangeably. Results cannot be interpreted as absolute evidence of the presence or absence of malignant disease. Performed At: [facility] [place], [place] [id number] Rush Farmer MD [id number]   . Total Protein 01/11/2020 7.0  6.5 - 8.1 g/dL Final  . Albumin 01/11/2020 3.6  3.5 - 5.0 g/dL Final  . AST 01/11/2020 18  15 - 41 U/L Final  . ALT 01/11/2020 13  0 - 44 U/L Final  . Alkaline Phosphatase 01/11/2020 96  38 - 126 U/L Final  . Total Bilirubin 01/11/2020 0.3  0.3 - 1.2 mg/dL Final  . Bilirubin, Direct 01/11/2020 <0.1  0.0 - 0.2 mg/dL Final  . Indirect Bilirubin 01/11/2020 NOT CALCULATED  0.3 - 0.9 mg/dL Final   Performed at [facility], [address],  [place], [place] [address]    Assessment:  GILBERTO STRECK is a 85 y.o. female with stage IA right breast cancers/p lumpectomy  and sentinel lymph node biopsy in 04/2008. Pathology revealed a 1.1 cm grade I invasive ductal carcinoma. Margins were negative. There was no lymphovascular invasion. Stage was T1cN0M0. Tumor was ER and PR positive and HER-2 negative (2+ by IHC but FISH -). Oncotype DX testing revealed an intermediate score of 21.  She received adjuvant chemotherapy. Chemotherapy was truncated secondary to an allergic reaction to Taxotere. She received TC1 followed by weekly Taxol and Cytoxan(no records available). She received radiation. She received 5 years of Femara(10/26/2008 - 10/2013).  CA27.29 has been followed: 16.4 on 06/20/2011, 23.4 on 01/16/2012, 20.7 on 07/23/2012, 23 on 01/11/2013, 22.5 on 11/25/2013, 20.9 on 04/23/2017, 18.2 on 05/06/2018, 21.2 on 05/05/2019, and 16.2 on 01/11/2020.  Bilateral screening mammogramon 04/12/2019 revealed no evidence of malignancy.  Chest CTon 07/06/2020revealedno acute right rib fracture or evidence of acute traumatic injury to the thorax. There were pulmonary nodules, the majority of which are stable, however11 x 8 mm right lower lobe pulmonary nodule hadincreased in size from 2015 and hadirregular margins.   PET scanon 05/03/2019 revealed a11 x 8 mm nodule along the anterolateral right lung base, slowly progressive from 2015, concerning for indolent primary bronchogenicneoplasm.There was mild hypermetabolism in the right hilar region,favored to be reactive. No findings suspicious for metastatic disease. There was hypermetabolism involving the right 4th, 7th, and 10th ribs.In the setting of trauma, this appearance is suspicious for healing nondisplaced fractures.  She received SBRT to the RLL nodule from 05/31/2019 - 06/15/2019.  Chest CT on 01/14/2020 revealed a decreased nodularity in the RIGHT lower lobe following  SBRT. There was new focus of peripheral consolidation with air bronchograms in the RIGHT middle lobe was favored a focus of infection.  Bone density on 01/27/2014 revealed osteoporosiswith a T score of -3.6 in the AP spine and -3.7 in the right femur. Bone density on 04/02/2016 revealed a T score of -3.4 in the AP spine (L1-L4) and -3.2 in the right femur (improved). Bone density on 04/07/2018 revealed osteoporosis with a T score of -3.4 in the right femur.She stopped Fosamax in 2017.  Symptomatically, her breast/rib pain is improving.  She denies any fever or cough.  She has completed a course of azithromycin for possible pneumonia.  Plan: 1.   Review labs from 01/07/2020 and 01/11/2020. 2.   Stage IA right breast cancer Clinically, she is doing well  Exam reveals no evidence of recurrent disease.  She is11+ yearss/pinitial diagnosis.  CA 27.29 was normal on 01/11/2020. Continue surveillance. 3. Right breast pain  Right breast/rib pain is resolving.  No apparent trauma.    Etiology remains unclear. 4.   Right lower lobe nodule She was unable to undergo biopsy. She was felt to be a stage I indolent primary lung cancer. She is status post SBRT. Chest CT on 01/14/2020 was personally reviewed.  Agree with radiology interpretation.     She has decreased nodularity in the RIGHT lower lobe following SBRT.    There was new focus of peripheral consolidation with air bronchograms in the RIGHT middle lobe.  Continue surveillance. 5. Osteoporosis Continue calcium and vitamin D Address patient's thoughts about Prolia at next visit. 6.Right mid lung infiltrate   Patient is s/p a course of azithromycin   Repeat CXR (PA and lateral) on 02/07/2020  7.   RTC in 3 months for MD assessment and +/- labs (to be determined day of clinic).   I discussed the assessment and  treatment plan with the patient.  The patient was provided  an opportunity to ask questions and all were answered.  The patient agreed with the plan and demonstrated an understanding of the instructions.  The patient was advised to call back or seek an in person evaluation if the symptoms worsen or if the condition fails to improve as anticipated.  I provided 13 minutes (11:38 AM - 11:51 AM) of face-to-face time during this this encounter and > 50% was spent counseling as documented under my assessment and plan.  I provided these services from the [facility] office. An additional 8-10 minutes were spent reviewing her chart (Epic and Care Everywhere) including notes, labs, and imaging studies.    Nolon Stalls, MD, PhD  01/19/2020, 11:38 AM  I, Heywood Footman, am acting as Education administrator for [employer]. Mike Gip, MD, PhD.  I, Rashika Bettes C. Mike Gip, MD, have reviewed the above documentation for accuracy and completeness, and I agree with the above.

## 2020-01-19 ENCOUNTER — Encounter: Payer: Self-pay | Admitting: Hematology and Oncology

## 2020-01-19 ENCOUNTER — Inpatient Hospital Stay (HOSPITAL_BASED_OUTPATIENT_CLINIC_OR_DEPARTMENT_OTHER): Payer: Medicare HMO | Admitting: Hematology and Oncology

## 2020-01-19 ENCOUNTER — Other Ambulatory Visit: Payer: Self-pay

## 2020-01-19 VITALS — BP 113/62 | HR 83 | Temp 96.6°F | Resp 18 | Ht 63.0 in

## 2020-01-19 DIAGNOSIS — R918 Other nonspecific abnormal finding of lung field: Secondary | ICD-10-CM

## 2020-01-19 DIAGNOSIS — C50911 Malignant neoplasm of unspecified site of right female breast: Secondary | ICD-10-CM

## 2020-01-19 DIAGNOSIS — Z170 Estrogen receptor positive status [ER+]: Secondary | ICD-10-CM

## 2020-01-19 DIAGNOSIS — N644 Mastodynia: Secondary | ICD-10-CM | POA: Diagnosis not present

## 2020-01-19 DIAGNOSIS — M810 Age-related osteoporosis without current pathological fracture: Secondary | ICD-10-CM | POA: Diagnosis not present

## 2020-01-19 DIAGNOSIS — C3431 Malignant neoplasm of lower lobe, right bronchus or lung: Secondary | ICD-10-CM | POA: Diagnosis not present

## 2020-01-19 NOTE — Progress Notes (Signed)
No new changes noted today 

## 2020-01-20 ENCOUNTER — Other Ambulatory Visit: Payer: Self-pay

## 2020-01-20 ENCOUNTER — Encounter: Payer: Self-pay | Admitting: Radiation Oncology

## 2020-01-21 ENCOUNTER — Other Ambulatory Visit: Payer: Self-pay | Admitting: *Deleted

## 2020-01-21 ENCOUNTER — Ambulatory Visit
Admission: RE | Admit: 2020-01-21 | Discharge: 2020-01-21 | Disposition: A | Discharge status: Home / Self Care | Payer: Medicare HMO | Source: Ambulatory Visit | Attending: Radiation Oncology | Admitting: Radiation Oncology

## 2020-01-21 ENCOUNTER — Other Ambulatory Visit: Payer: Self-pay

## 2020-01-21 VITALS — BP 127/86 | HR 79 | Temp 96.0°F | Resp 16

## 2020-01-21 DIAGNOSIS — R911 Solitary pulmonary nodule: Secondary | ICD-10-CM

## 2020-01-21 NOTE — Progress Notes (Signed)
Radiation Oncology Follow up Note  Name: [place]   Date:   01/21/2020 MRN:  [id number] DOB: June 20, 1935    This 85 y.o. female presents to the clinic today for 78-month follow-up status post SBRT for stage I non-small cell lung cancer of the right lower lobe.  REFERRING PROVIDER: Sofie Hartigan, MD  HPI: Patient is a 85 year old female now out 7 months status post SBRT to her right lower lobe for stage I non-small cell lung cancer seen today in routine follow-up she is doing well specifically has cough hemoptysis chest tightness.  She was having some rib pain which was probably related to a minor degree of pneumonia.  She had a CT scan.  Last week showing decreased nodularity in right lower lobe consistent with treatment response.  She had new focus peripheral consolidation in the right middle lobe favoring a focus of infection.  That area corresponds to areas of pain that pain has subsided.  COMPLICATIONS OF TREATMENT: none  FOLLOW UP COMPLIANCE: keeps appointments   PHYSICAL EXAM:  BP 127/86   Pulse 79   Temp (!) 96 F (35.6 C)   Resp 16   SpO2 100%  Wheelchair-bound female in NAD.  Well-developed well-nourished patient in NAD. HEENT reveals PERLA, EOMI, discs not visualized.  Oral cavity is clear. No oral mucosal lesions are identified. Neck is clear without evidence of cervical or supraclavicular adenopathy. Lungs are clear to A&P. Cardiac examination is essentially unremarkable with regular rate and rhythm without murmur rub or thrill. Abdomen is benign with no organomegaly or masses noted. Motor sensory and DTR levels are equal and symmetric in the upper and lower extremities. Cranial nerves II through XII are grossly intact. Proprioception is intact. No peripheral adenopathy or edema is identified. No motor or sensory levels are noted. Crude visual fields are within normal range.  RADIOLOGY RESULTS: CT scan reviewed compatible with above-stated findings  PLAN: Present  time patient is doing well.  Excellent response to treatment by CT criteria.  I am pleased with her overall progress.  I have asked to see her back in 1 year for follow-up with a CT scan prior to that visit.  Patient continues close follow-up care with Dr. Mike Gip.  I would like to take this opportunity to thank you for allowing me to participate in the care of your patient.Noreene Filbert, MD

## 2020-02-09 ENCOUNTER — Other Ambulatory Visit: Payer: Self-pay

## 2020-02-09 ENCOUNTER — Encounter: Payer: Self-pay | Admitting: Physical Therapy

## 2020-02-09 ENCOUNTER — Ambulatory Visit: Payer: Medicare HMO | Attending: Family Medicine | Admitting: Physical Therapy

## 2020-02-09 DIAGNOSIS — M25662 Stiffness of left knee, not elsewhere classified: Secondary | ICD-10-CM | POA: Insufficient documentation

## 2020-02-09 DIAGNOSIS — M6281 Muscle weakness (generalized): Secondary | ICD-10-CM

## 2020-02-09 DIAGNOSIS — R262 Difficulty in walking, not elsewhere classified: Secondary | ICD-10-CM

## 2020-02-09 DIAGNOSIS — M25661 Stiffness of right knee, not elsewhere classified: Secondary | ICD-10-CM | POA: Diagnosis not present

## 2020-02-09 NOTE — Therapy (Signed)
[place] [facility] [address] [place], [place], [address] Phone: [phone]   Fax:  [phone]  Physical Therapy Evaluation  Patient Details  Name: Cynthia Walker MRN: [id number] Date of Birth: Jul 22, 1935 Referring Provider (PT): Feldpausch   Encounter Date: 02/09/2020  PT End of Session - 02/09/20 1240    Visit Number  1    Number of Visits  24    Date for PT Re-Evaluation  05/03/20    PT Start Time  1000    PT Stop Time  1100    PT Time Calculation (min)  60 min    Equipment Utilized During Treatment  Gait belt    Activity Tolerance  Patient limited by fatigue;Other (comment)   Patient limited by cognition/arousal   Behavior During Therapy  Flat affect   Highly distractable      Past Medical History:  Diagnosis Date  . Breast cancer [facility]) 2009   Right breast CA with lumpectomy, radiation and chemo tx's.  . Dementia ([place])   . Hypertension   . Personal history of chemotherapy   . Personal history of radiation therapy     Past Surgical History:  Procedure Laterality Date  . BREAST BIOPSY Right 03/15/2008   invasive mammary carcinoma  . BREAST LUMPECTOMY Right 04/14/2008   Pathology revealed a 1.1 cm grade I invasive ductal carcinoma. Margins were negative.   . COLONOSCOPY WITH PROPOFOL N/A 07/10/2015   Procedure: COLONOSCOPY WITH PROPOFOL;  Surgeon: Hulen Luster, MD;  Location: [facility] ENDOSCOPY;  Service: Gastroenterology;  Laterality: N/A;  . INTRAMEDULLARY (IM) NAIL INTERTROCHANTERIC Left 06/27/2019   Procedure: INTRAMEDULLARY (IM) NAIL INTERTROCHANTRIC;  Surgeon: Creig Hines, MD;  Location: [facility] ORS;  Service: Orthopedics;  Laterality: Left;  . TONSILLECTOMY     age of 85 years old    There were no vitals filed for this visit.   Subjective Assessment - 02/09/20 1231    Subjective  Patient presents to clinic in w/c with spouse for evaluation regarding BLE weakness as a result of L femur fracture and fixation  06/2019. Patient is asleep on arrival, but able to be aroused although remaining somewhat drowsy. Patient oriented to self and place. Patient unable to provide her birthdate, birth month, nor birth year when asked. Patient's spouse notes that she made decent progress with HHPT but since they have been discharged from that service, she has not been as active. Patient's spouse notes that he is performing all transfers <> w/c, bed, couch. Patient requires > 75% assistance for most tasks. Patient's spouse reports a significantly diminished attention span.    Patient is accompained by:  Family member   husband, Cynthia Walker   Pertinent History  Femur fracture, left (CMS-HCC) 06/26/2019; Patient was ambulatory without AD prior to the fall that resulted in fx. Patient's spouse notes that she was capable of walking ~0.5 miles prior to the fall. Patient's spouse reports that she can at present stand for up to 10 min, but not consistently and not without assistance. While working with HHPT patient was able to ambulate with a RW for ~ 200 feet with rest breaks throughout.    Limitations  Lifting;Standing;Walking;House hold activities    How long can you sit comfortably?  Unlimited    How long can you stand comfortably?  5-10 min with support    How long can you walk comfortably?  < 200 feet    Patient Stated Goals  2/2 to cognitive state, patient did not state  speicifc goals. Spouse would like to see her ambulatory within the house.    Currently in Pain?  Other (Comment)   Faces: no pain        [facility] PT Assessment - 02/09/20 0001      Assessment   Medical Diagnosis  BLE weakness    Referring Provider (PT)  Feldpausch    Prior Therapy  HHPT (d/c early April 2021)      Precautions   Precautions  Fall      ROM / Strength   AROM / PROM / Strength  AROM      AROM   Overall AROM   Deficits    AROM Assessment Site  Knee    Right/Left Knee  Right;Left    Right Knee Extension  --   lacking 15-20 degrees   Left  Knee Extension  --   lackign 15-20 degrees     Transfers   Transfers  Sit to Stand;Stand to Sit    Sit to Stand  3: Mod assist;With upper extremity assist    Sit to Stand Details  Tactile cues for initiation;Tactile cues for sequencing;Tactile cues for posture;Tactile cues for placement;Verbal cues for sequencing;Verbal cues for precautions/safety;Visual cues/gestures for precautions/safety    Stand to Sit  4: Min assist;With upper extremity assist    Stand to Sit Details (indicate cue type and reason)  Tactile cues for initiation;Verbal cues for sequencing      Balance   Balance Assessed  Yes      Static Sitting Balance   Static Sitting - Balance Support  Feet supported    Static Sitting - Level of Assistance  5: Stand by assistance    Static Sitting - Comment/# of Minutes  without back support; < 1 min      Static Standing Balance   Static Standing - Balance Support  Bilateral upper extremity supported    Static Standing - Level of Assistance  4: Min assist;3: Mod assist    Static Standing - Comment/# of Minutes  1.5 min      MUSCULOSKELETAL: Tremor: Absent Bulk: Normal Tone: Normal, no clonus  Posture Patient assumes rounded spine posture in wheelchair with hips forward and arms crossed over chest/abdomen.  Gait Patient non-ambulatory. Patient unable to stand for > 90 seconds.  Strength R/L 3/3 Hip flexion 3-/3- Hip extension  3/3 Hip abduction 3/3 Hip adduction 3-/3- Knee extension 3/3 Knee flexion 4/4 Ankle Plantarflexion (seated) 3-/3- Ankle Dorsiflexion   NEUROLOGICAL: Mental Status Patient is oriented to self and place. Recent memory is not fully intact. Patient unable to recall breakfast. Remote memory is not fully intact. Patient unable to recall when she fell and her state prior to falling (ambulatory without AD)  Attention span and concentration are significantly limited. Patient unable to consistently follow one-step commands. Expressive speech is  intact.  Patient's fund of knowledge is within normal limits for educational level.  Cranial Nerves Visual acuity and visual fields are intact  Extraocular muscles are intact  Facial sensation is intact bilaterally  Facial strength is intact bilaterally  Hearing is normal as tested by gross conversation Palate elevates midline, normal phonation  Shoulder shrug strength is intact  Tongue protrudes midline   Sensation Grossly intact to light touch bilateral UEs/LEs as determined by testing dermatomes C2-T2/L2-S2 respectively Proprioception and hot/cold testing deferred on this date    ASSESSMENT Patient is an 85 year old presenting to clinic with chief complaints of BLE weakness and difficulty standing and walking. Upon  examination, patient demonstrates deficits in cognition, posture, BLE strength, gait, balance, and function as evidenced by inability to consistently recall recent and remote facts, inability to consistently follow one-step commands, kyphotic posture with posterior pelvic tilt, grossly 3-/5 MMT for BLE, inability to ambulate, and inability to stand without MinA, BUE support, and MAX VCs. Patient's responses on FOTO outcome measures (16) indicate severe functional limitations/disability/distress. Patient's progress may be limited due to changes in cognition; however, patient's spousal support is advantageous. Patient was able to achieve standing for 90 seconds with MinA and BUE support with MAX encouragement and VCs during today's evaluation and responded positively to ROM interventions. Patient will benefit from continued skilled therapeutic intervention to address deficits in cognition, posture, BLE strength, gait, balance, and function in order to increase function and improve overall QOL.   Objective measurements completed on examination: See above findings.   TREATMENT  Therapeutic Exercise: B hamstring stretch, 3x 30 sec each, in sitting with max PT assist Caregiver  education on PROM and prolonged hamstring stretch for decreased risk of contracture and improved ability to stand.   Patient and caregiver educated on prognosis, POC, and provided with HEP including: B hamstring stretch. Patient articulated understanding and returned demonstration. Patient will benefit from further education in order to maximize compliance and understanding for long-term therapeutic gains.       PT Long Term Goals - 02/09/20 1415      PT LONG TERM GOAL #1   Title  Patient will be modified independent with spousal support with HEP for improved strength and balance in order to decrease fall risk and dependence at home and in the community.    Baseline  IE: not demonstrated    Time  12    Period  Weeks    Status  New    Target Date  05/03/20      PT LONG TERM GOAL #2   Title  Patient will demonstrate improved function as evidenced by a score of 44 on FOTO measure for full participation in activities at home and in the community.    Baseline  IE: 16    Time  12    Period  Weeks    Status  New    Target Date  05/03/20      PT LONG TERM GOAL #3   Title  Patient will perform STS with BUE support with Modified IND/SBA with VCs for sequencing and safety in order to participate more fully in basic ADLs.    Baseline  IE: Mod/MinA, MAX VCs and TCs for sequencing/safety    Time  12    Period  Weeks    Status  New    Target Date  05/03/20      PT LONG TERM GOAL #4   Title  Patient will demonstrate improved B knee extension ROM as evidenced by lacking < 10 degrees of B knee extension PROM/AROM in order to achieve standing posture with greater independence.    Baseline  IE: B lacking 15-20 degrees of knee extension    Time  12    Period  Weeks    Status  New    Target Date  05/03/20      PT LONG TERM GOAL #5   Title  Patient will demonstrate improved static standing balance as evidenced by ability to maintain standing posture with CGA/SBA, SUE support for 4 minutes in  order to participate in basic ADLs.    Baseline  IE: 90 sec, MinA, BUE  support    Time  20    Period  Weeks    Status  New    Target Date  05/03/20             Plan - 02/09/20 1241    Clinical Impression Statement  Patient is an 85 year old presenting to clinic with chief complaints of BLE weakness and difficulty standing and walking. Upon examination, patient demonstrates deficits in cognition, posture, BLE strength, gait, balance, and function as evidenced by inability to consistently recall recent and remote facts, inability to consistently follow one-step commands, kyphotic posture with posterior pelvic tilt, grossly 3-/5 MMT for BLE, inability to ambulate, and inability to stand without MinA, BUE support, and MAX VCs. Patient's responses on FOTO outcome measures (16) indicate severe functional limitations/disability/distress. Patient's progress may be limited due to changes in cognition; however, patient's spousal support is advantageous. Patient was able to achieve standing for 90 seconds with MinA and BUE support with MAX encouragement and VCs during today's evaluation and responded positively to ROM interventions. Patient will benefit from continued skilled therapeutic intervention to address deficits in cognition, posture, BLE strength, gait, balance, and function in order to increase function and improve overall QOL.    Personal Factors and Comorbidities  Age;Education;Comorbidity 3+;Past/Current Experience;Time since onset of injury/illness/exacerbation;Fitness;Transportation;Other   Cognitive State   Comorbidities  anemia, breast cancer, chronic constipation, diverticulosis, hypertension, hyperlipidemia, osteoporosis    Examination-Activity Limitations  Bathing;Dressing;Transfers;Hygiene/Grooming;Bed Mobility;Squat;Lift;Bend;Locomotion Level;Stairs;Reach Overhead;Stand;Toileting;Carry    Examination-Participation Restrictions  Church;Interpersonal Relationship;Personal Finances;Yard  Work;Cleaning;Laundry;Community Activity;Medication Management;Shop;Driving;Meal Prep    Stability/Clinical Decision Making  Evolving/Moderate complexity    Clinical Decision Making  Moderate    Rehab Potential  Fair    PT Frequency  2x / week    PT Duration  12 weeks    PT Treatment/Interventions  Moist Heat;DME Instruction;Therapeutic activities;Functional mobility training;Stair training;Gait training;Therapeutic exercise;Balance training;Neuromuscular re-education;Cognitive remediation;Orthotic Fit/Training;Patient/family education;Manual techniques;Scar mobilization;Passive range of motion;Energy conservation;Taping    PT Next Visit Plan  Hamstring extensibility, standing tolerance with weight shifts    PT Home Exercise Plan  hamstring stretch    Consulted and Agree with Plan of Care  Patient;Family member/caregiver    Family Member Consulted  Cynthia Walker, husband       Patient will benefit from skilled therapeutic intervention in order to improve the following deficits and impairments:  Abnormal gait, Decreased balance, Decreased endurance, Decreased mobility, Difficulty walking, Hypomobility, Postural dysfunction, Improper body mechanics, Increased edema, Decreased range of motion, Decreased cognition, Decreased activity tolerance, Decreased coordination, Decreased safety awareness, Decreased strength, Impaired flexibility  Visit Diagnosis: Decreased range of motion (ROM) of both knees  Muscle weakness (generalized)  Difficulty in walking, not elsewhere classified     Problem List Patient Active Problem List   Diagnosis Date Noted  . Breast pain, right 01/11/2020  . Femoral neck fracture ([place]) 06/27/2019  . Femur fracture, left ([place]) 06/26/2019  . Right lower lobe pulmonary nodule 05/04/2019  . Syncope 09/27/2017  . Atypical chest pain 09/03/2017  . Essential hypertension 09/03/2017  . Short-term memory loss 09/03/2017  . Osteoporosis 04/02/2016  . Breast cancer, right  [facility]) 04/06/2008   Myles Gip PT, DPT [phone]  02/09/2020, 2:20 PM  [facility] [address] [place], [place], [address] Phone: [phone]   Fax:  [phone]  Name: Cynthia Walker MRN: [id number] Date of Birth: 06-06-1935

## 2020-02-15 ENCOUNTER — Ambulatory Visit: Payer: Medicare HMO | Admitting: Physical Therapy

## 2020-02-15 ENCOUNTER — Other Ambulatory Visit: Payer: Self-pay

## 2020-02-15 ENCOUNTER — Encounter: Payer: Self-pay | Admitting: Physical Therapy

## 2020-02-15 DIAGNOSIS — M6281 Muscle weakness (generalized): Secondary | ICD-10-CM

## 2020-02-15 DIAGNOSIS — M25661 Stiffness of right knee, not elsewhere classified: Secondary | ICD-10-CM

## 2020-02-15 DIAGNOSIS — R7881 Bacteremia: Secondary | ICD-10-CM | POA: Diagnosis not present

## 2020-02-15 DIAGNOSIS — R262 Difficulty in walking, not elsewhere classified: Secondary | ICD-10-CM

## 2020-02-15 NOTE — Therapy (Signed)
[place] [address] [place], [place], [address] Phone: [phone]   Fax:  [phone]  Physical Therapy Treatment  Patient Details  Name: Cynthia Walker MRN: [id number] Date of Birth: 08-22-1935 Referring Provider (PT): Feldpausch   Encounter Date: 02/15/2020  PT End of Session - 02/15/20 8841    Visit Number  2    Number of Visits  24    Date for PT Re-Evaluation  05/03/20    PT Start Time  1057    PT Stop Time  1152    PT Time Calculation (min)  55 min    Equipment Utilized During Treatment  Gait belt    Activity Tolerance  Patient tolerated treatment well    Behavior During Therapy  [facility] for tasks assessed/performed   Highly distractable      Past Medical History:  Diagnosis Date  . Breast cancer [facility]) 2009   Right breast CA with lumpectomy, radiation and chemo tx's.  . Dementia ([place])   . Hypertension   . Personal history of chemotherapy   . Personal history of radiation therapy     Past Surgical History:  Procedure Laterality Date  . BREAST BIOPSY Right 03/15/2008   invasive mammary carcinoma  . BREAST LUMPECTOMY Right 04/14/2008   Pathology revealed a 1.1 cm grade I invasive ductal carcinoma. Margins were negative.   . COLONOSCOPY WITH PROPOFOL N/A 07/10/2015   Procedure: COLONOSCOPY WITH PROPOFOL;  Surgeon: Hulen Luster, MD;  Location: [facility] ENDOSCOPY;  Service: Gastroenterology;  Laterality: N/A;  . INTRAMEDULLARY (IM) NAIL INTERTROCHANTERIC Left 06/27/2019   Procedure: INTRAMEDULLARY (IM) NAIL INTERTROCHANTRIC;  Surgeon: Creig Hines, MD;  Location: [facility] ORS;  Service: Orthopedics;  Laterality: Left;  . TONSILLECTOMY     age of 85 years old    There were no vitals filed for this visit.  Subjective Assessment - 02/15/20 1240    Subjective  Patient presents to clinic with spouse with much more alertness. Patient and spouse note that her L hand edema has continued and patient is complaining of more pain  referring to the hand as "horrible." Patient's spouse notes that they have been workign on stretching the hamstrings for 3 min each day. Patient and spouse deny any other ocncerns at this time.    Patient is accompained by:  Family member   husband, [name]   Pertinent History  Femur fracture, left (CMS-HCC) 06/26/2019; Patient was ambulatory without AD prior to the fall that resulted in fx. Patient's spouse notes that she was capable of walking ~0.5 miles prior to the fall. Patient's spouse reports that she can at present stand for up to 10 min, but not consistently and not without assistance. While working with HHPT patient was able to ambulate with a RW for ~ 200 feet with rest breaks throughout.    Limitations  Lifting;Standing;Walking;House hold activities    How long can you sit comfortably?  Unlimited    How long can you stand comfortably?  5-10 min with support    How long can you walk comfortably?  < 200 feet    Patient Stated Goals  2/2 to cognitive state, patient did not state speicifc goals. Spouse would like to see her ambulatory within the house.      TREATMENT  Pre-treatment assessment: B knees lacking 10-15 degrees extension  Neuromuscular Re-education: // bars: Standing posture/balance, BUE support, CGA, 2x 90sec, max VCs for erect posture Standing weight shifts, BUE support, CGA, x10 each side with  VCs for orientation to task STS, BUE support, CGA/MinA, 2x5 with VCs and TCs for sequencing and safety Standing forward/retro seps, CGA/MinA, x3 with LLE, d/c 2/2 to patient confusion with task Standing balance with over the shoulder gaze, x30 sec each side, BUE support, CGA, max VCs for safety and orientation to task  Therapeutic Exercise: Seated hamstring stretch, BLE, 2 min x2 each leg for improved ability to stand Seated hip abduction isometrics 5 sec hold, x10 BLE  Patient educated throughout session on appropriate technique and form using multi-modal cueing, HEP, and activity  modification. Patient articulated understanding and returned demonstration.  Patient Response to interventions: Patient lacking attention/focus toward end of session, but does not grimmace or indicate fatigue/pain.  ASSESSMENT Patient presents to clinic with excellent motivation to participate in therapy. Patient demonstrates deficits incognition, posture, BLE strength, gait, balance, and function. Patient able to achieve STS for increased reps with CGA/MinA during today's session and responded positively to active interventions. Patient will benefit from continued skilled therapeutic intervention to address remaining deficits in cognition, posture, BLE strength, gait, balance, and function in order to increase function and improve overall QOL.     PT Long Term Goals - 02/09/20 1415      PT LONG TERM GOAL #1   Title  Patient will be modified independent with spousal support with HEP for improved strength and balance in order to decrease fall risk and dependence at home and in the community.    Baseline  IE: not demonstrated    Time  12    Period  Weeks    Status  New    Target Date  05/03/20      PT LONG TERM GOAL #2   Title  Patient will demonstrate improved function as evidenced by a score of 44 on FOTO measure for full participation in activities at home and in the community.    Baseline  IE: 16    Time  12    Period  Weeks    Status  New    Target Date  05/03/20      PT LONG TERM GOAL #3   Title  Patient will perform STS with BUE support with Modified IND/SBA with VCs for sequencing and safety in order to participate more fully in basic ADLs.    Baseline  IE: Mod/MinA, MAX VCs and TCs for sequencing/safety    Time  12    Period  Weeks    Status  New    Target Date  05/03/20      PT LONG TERM GOAL #4   Title  Patient will demonstrate improved B knee extension ROM as evidenced by lacking < 10 degrees of B knee extension PROM/AROM in order to achieve standing posture with  greater independence.    Baseline  IE: B lacking 15-20 degrees of knee extension    Time  12    Period  Weeks    Status  New    Target Date  05/03/20      PT LONG TERM GOAL #5   Title  Patient will demonstrate improved static standing balance as evidenced by ability to maintain standing posture with CGA/SBA, SUE support for 4 minutes in order to participate in basic ADLs.    Baseline  IE: 90 sec, MinA, BUE support    Time  12    Period  Weeks    Status  New    Target Date  05/03/20  Plan - 02/15/20 1242    Clinical Impression Statement  Patient presents to clinic with excellent motivation to participate in therapy. Patient demonstrates deficits incognition, posture, BLE strength, gait, balance, and function. Patient able to achieve STS for increased reps with CGA/MinA during today's session and responded positively to active interventions. Patient will benefit from continued skilled therapeutic intervention to address remaining deficits in cognition, posture, BLE strength, gait, balance, and function in order to increase function and improve overall QOL.    Personal Factors and Comorbidities  Age;Education;Comorbidity 3+;Past/Current Experience;Time since onset of injury/illness/exacerbation;Fitness;Transportation;Other   Cognitive State   Comorbidities  anemia, breast cancer, chronic constipation, diverticulosis, hypertension, hyperlipidemia, osteoporosis    Examination-Activity Limitations  Bathing;Dressing;Transfers;Hygiene/Grooming;Bed Mobility;Squat;Lift;Bend;Locomotion Level;Stairs;Reach Overhead;Stand;Toileting;Carry    Examination-Participation Restrictions  Church;Interpersonal Relationship;Personal Finances;Yard Work;Cleaning;Laundry;Community Activity;Medication Management;Shop;Driving;Meal Prep    Stability/Clinical Decision Making  Evolving/Moderate complexity    Rehab Potential  Fair    PT Frequency  2x / week    PT Duration  12 weeks    PT  Treatment/Interventions  Moist Heat;DME Instruction;Therapeutic activities;Functional mobility training;Stair training;Gait training;Therapeutic exercise;Balance training;Neuromuscular re-education;Cognitive remediation;Orthotic Fit/Training;Patient/family education;Manual techniques;Scar mobilization;Passive range of motion;Energy conservation;Taping    PT Next Visit Plan  Hamstring extensibility, standing tolerance with weight shifts    PT Home Exercise Plan  hamstring stretch    Consulted and Agree with Plan of Care  Patient;Family member/caregiver    Family Member Consulted  Sabine Tenenbaum, husband       Patient will benefit from skilled therapeutic intervention in order to improve the following deficits and impairments:  Abnormal gait, Decreased balance, Decreased endurance, Decreased mobility, Difficulty walking, Hypomobility, Postural dysfunction, Improper body mechanics, Increased edema, Decreased range of motion, Decreased cognition, Decreased activity tolerance, Decreased coordination, Decreased safety awareness, Decreased strength, Impaired flexibility  Visit Diagnosis: Decreased range of motion (ROM) of both knees  Muscle weakness (generalized)  Difficulty in walking, not elsewhere classified     Problem List Patient Active Problem List   Diagnosis Date Noted  . Breast pain, right 01/11/2020  . Femoral neck fracture ([place]) 06/27/2019  . Femur fracture, left ([place]) 06/26/2019  . Right lower lobe pulmonary nodule 05/04/2019  . Syncope 09/27/2017  . Atypical chest pain 09/03/2017  . Essential hypertension 09/03/2017  . Short-term memory loss 09/03/2017  . Osteoporosis 04/02/2016  . Breast cancer, right [facility]) 04/06/2008   Myles Gip PT, DPT [phone]  02/15/2020, 12:50 PM  [facility] [address] [place], [place], [address] Phone: [phone]   Fax:  [phone]  Name: Cynthia Walker MRN: [id number] Date of Birth:  11-18-1934

## 2020-02-16 ENCOUNTER — Emergency Department: Payer: Medicare HMO

## 2020-02-16 ENCOUNTER — Inpatient Hospital Stay
Admission: EM | Admit: 2020-02-16 | Discharge: 2020-02-21 | DRG: 872 | Disposition: A | Discharge status: Home / Self Care | Payer: Medicare HMO | Attending: Internal Medicine | Admitting: Internal Medicine

## 2020-02-16 ENCOUNTER — Other Ambulatory Visit: Payer: Self-pay

## 2020-02-16 ENCOUNTER — Emergency Department
Admission: EM | Admit: 2020-02-16 | Discharge: 2020-02-16 | Disposition: A | Discharge status: Home / Self Care | Payer: Medicare HMO | Source: Home / Self Care | Attending: Student | Admitting: Student

## 2020-02-16 DIAGNOSIS — R4182 Altered mental status, unspecified: Secondary | ICD-10-CM

## 2020-02-16 DIAGNOSIS — Z20822 Contact with and (suspected) exposure to covid-19: Secondary | ICD-10-CM | POA: Diagnosis present

## 2020-02-16 DIAGNOSIS — Z79899 Other long term (current) drug therapy: Secondary | ICD-10-CM | POA: Insufficient documentation

## 2020-02-16 DIAGNOSIS — R509 Fever, unspecified: Secondary | ICD-10-CM

## 2020-02-16 DIAGNOSIS — Z9221 Personal history of antineoplastic chemotherapy: Secondary | ICD-10-CM

## 2020-02-16 DIAGNOSIS — Z880 Allergy status to penicillin: Secondary | ICD-10-CM

## 2020-02-16 DIAGNOSIS — Z515 Encounter for palliative care: Secondary | ICD-10-CM | POA: Diagnosis not present

## 2020-02-16 DIAGNOSIS — I10 Essential (primary) hypertension: Secondary | ICD-10-CM | POA: Insufficient documentation

## 2020-02-16 DIAGNOSIS — Z91040 Latex allergy status: Secondary | ICD-10-CM | POA: Insufficient documentation

## 2020-02-16 DIAGNOSIS — R2232 Localized swelling, mass and lump, left upper limb: Secondary | ICD-10-CM | POA: Insufficient documentation

## 2020-02-16 DIAGNOSIS — Z853 Personal history of malignant neoplasm of breast: Secondary | ICD-10-CM

## 2020-02-16 DIAGNOSIS — R7881 Bacteremia: Principal | ICD-10-CM | POA: Diagnosis present

## 2020-02-16 DIAGNOSIS — M810 Age-related osteoporosis without current pathological fracture: Secondary | ICD-10-CM | POA: Diagnosis present

## 2020-02-16 DIAGNOSIS — R413 Other amnesia: Secondary | ICD-10-CM

## 2020-02-16 DIAGNOSIS — Z887 Allergy status to serum and vaccine status: Secondary | ICD-10-CM

## 2020-02-16 DIAGNOSIS — F0390 Unspecified dementia without behavioral disturbance: Secondary | ICD-10-CM | POA: Diagnosis present

## 2020-02-16 DIAGNOSIS — Z803 Family history of malignant neoplasm of breast: Secondary | ICD-10-CM

## 2020-02-16 DIAGNOSIS — B9562 Methicillin resistant Staphylococcus aureus infection as the cause of diseases classified elsewhere: Secondary | ICD-10-CM | POA: Diagnosis present

## 2020-02-16 DIAGNOSIS — R54 Age-related physical debility: Secondary | ICD-10-CM | POA: Diagnosis present

## 2020-02-16 DIAGNOSIS — Z923 Personal history of irradiation: Secondary | ICD-10-CM

## 2020-02-16 DIAGNOSIS — Z888 Allergy status to other drugs, medicaments and biological substances status: Secondary | ICD-10-CM

## 2020-02-16 DIAGNOSIS — Z7189 Other specified counseling: Secondary | ICD-10-CM

## 2020-02-16 DIAGNOSIS — B955 Unspecified streptococcus as the cause of diseases classified elsewhere: Secondary | ICD-10-CM | POA: Diagnosis present

## 2020-02-16 DIAGNOSIS — M7989 Other specified soft tissue disorders: Secondary | ICD-10-CM | POA: Diagnosis present

## 2020-02-16 DIAGNOSIS — Z9102 Food additives allergy status: Secondary | ICD-10-CM

## 2020-02-16 DIAGNOSIS — Z8249 Family history of ischemic heart disease and other diseases of the circulatory system: Secondary | ICD-10-CM

## 2020-02-16 DIAGNOSIS — Z91012 Allergy to eggs: Secondary | ICD-10-CM

## 2020-02-16 DIAGNOSIS — Z66 Do not resuscitate: Secondary | ICD-10-CM | POA: Diagnosis present

## 2020-02-16 LAB — URINALYSIS, COMPLETE (UACMP) WITH MICROSCOPIC
Bacteria, UA: NONE SEEN
Bilirubin Urine: NEGATIVE
Glucose, UA: NEGATIVE mg/dL
Hgb urine dipstick: NEGATIVE
Ketones, ur: NEGATIVE mg/dL
Leukocytes,Ua: NEGATIVE
Nitrite: NEGATIVE
Protein, ur: NEGATIVE mg/dL
Specific Gravity, Urine: 1.019 (ref 1.005–1.030)
pH: 5 (ref 5.0–8.0)

## 2020-02-16 LAB — BLOOD CULTURE ID PANEL (REFLEXED)
Acinetobacter baumannii: NOT DETECTED
Candida albicans: NOT DETECTED
Candida glabrata: NOT DETECTED
Candida krusei: NOT DETECTED
Candida parapsilosis: NOT DETECTED
Candida tropicalis: NOT DETECTED
Enterobacter cloacae complex: NOT DETECTED
Enterobacteriaceae species: NOT DETECTED
Enterococcus species: NOT DETECTED
Escherichia coli: NOT DETECTED
Haemophilus influenzae: NOT DETECTED
Klebsiella oxytoca: NOT DETECTED
Klebsiella pneumoniae: NOT DETECTED
Listeria monocytogenes: NOT DETECTED
Methicillin resistance: DETECTED — AB
Neisseria meningitidis: NOT DETECTED
Proteus species: NOT DETECTED
Pseudomonas aeruginosa: NOT DETECTED
Serratia marcescens: NOT DETECTED
Staphylococcus aureus (BCID): NOT DETECTED
Staphylococcus species: DETECTED — AB
Streptococcus agalactiae: NOT DETECTED
Streptococcus pneumoniae: NOT DETECTED
Streptococcus pyogenes: NOT DETECTED
Streptococcus species: DETECTED — AB

## 2020-02-16 LAB — COMPREHENSIVE METABOLIC PANEL
ALT: 9 U/L (ref 0–44)
AST: 20 U/L (ref 15–41)
Albumin: 2.9 g/dL — ABNORMAL LOW (ref 3.5–5.0)
Alkaline Phosphatase: 67 U/L (ref 38–126)
Anion gap: 6 (ref 5–15)
BUN: 14 mg/dL (ref 8–23)
CO2: 26 mmol/L (ref 22–32)
Calcium: 7.8 mg/dL — ABNORMAL LOW (ref 8.9–10.3)
Chloride: 104 mmol/L (ref 98–111)
Creatinine, Ser: 0.57 mg/dL (ref 0.44–1.00)
GFR calc Af Amer: >60 mL/min (ref >60)
GFR calc non Af Amer: >60 mL/min (ref >60)
Glucose, Bld: 99 mg/dL (ref 70–99)
Potassium: 3.9 mmol/L (ref 3.5–5.1)
Sodium: 136 mmol/L (ref 135–145)
Total Bilirubin: 1 mg/dL (ref 0.3–1.2)
Total Protein: 5.8 g/dL — ABNORMAL LOW (ref 6.5–8.1)

## 2020-02-16 LAB — CBC WITH DIFFERENTIAL/PLATELET
Abs Immature Granulocytes: 0.02 10*3/uL (ref 0.00–0.07)
Basophils Absolute: 0 10*3/uL (ref 0.0–0.1)
Basophils Relative: 0 %
Eosinophils Absolute: 0.1 10*3/uL (ref 0.0–0.5)
Eosinophils Relative: 1 %
HCT: 31.8 % — ABNORMAL LOW (ref 36.0–46.0)
Hemoglobin: 10.7 g/dL — ABNORMAL LOW (ref 12.0–15.0)
Immature Granulocytes: 0 %
Lymphocytes Relative: 19 %
Lymphs Abs: 1.3 10*3/uL (ref 0.7–4.0)
MCH: 31.1 pg (ref 26.0–34.0)
MCHC: 33.6 g/dL (ref 30.0–36.0)
MCV: 92.4 fL (ref 80.0–100.0)
Monocytes Absolute: 0.6 10*3/uL (ref 0.1–1.0)
Monocytes Relative: 9 %
Neutro Abs: 4.7 10*3/uL (ref 1.7–7.7)
Neutrophils Relative %: 71 %
Platelets: 305 10*3/uL (ref 150–400)
RBC: 3.44 MIL/uL — ABNORMAL LOW (ref 3.87–5.11)
RDW: 11.9 % (ref 11.5–15.5)
WBC: 6.7 10*3/uL (ref 4.0–10.5)
nRBC: 0 % (ref 0.0–0.2)

## 2020-02-16 LAB — LACTIC ACID, PLASMA: Lactic Acid, Venous: 1.1 mmol/L (ref 0.5–1.9)

## 2020-02-16 MED ORDER — SODIUM CHLORIDE 0.9 % IV SOLN
1.0000 g | Freq: Once | INTRAVENOUS | Status: AC
  Administered 2020-02-17: 1 g via INTRAVENOUS
  Filled 2020-02-16: qty 1

## 2020-02-16 MED ORDER — VANCOMYCIN HCL IN DEXTROSE 1-5 GM/200ML-% IV SOLN
1000.0000 mg | Freq: Once | INTRAVENOUS | Status: AC
  Administered 2020-02-17: 1000 mg via INTRAVENOUS
  Filled 2020-02-16: qty 200

## 2020-02-16 NOTE — ED Triage Notes (Addendum)
Pt to ED via EMS from home, Per husband pt woke up this morning AMS. Pt is oriented to self and place only, when pt asked if she has had covid vaccine pt asked what covid was. Pt is a&ox4 at baseline, however does have short term memory loss. VSS. CBG 120

## 2020-02-16 NOTE — ED Provider Notes (Signed)
Work-up largely unremarkable.  No evidence of UTI.  Normal WBC count and lactic.  Electrolytes without actionable derangements.  CT head without acute findings.  Given negative work-up, feel patient is stable for discharge with outpatient follow-up.  On reassessment, patient is resting comfortably.  Husband at bedside says she seems more peaceful and calm, and closer to her baseline.  He is worried that he has been unable to remove the ring on her left fourth digit for the last few days because her hand has been a little bit more swollen than normal, she takes fluid pills for this.  It is not easily removable, however she has no gross swelling of the finger, no evidence of neurovascular compromise, able to range at the MCP, PIP, DIP and compartments are soft.  Cap refill less than 2 seconds.  Attempted to remove her ring with the string method and was nearly successful, however procedure terminated due to patient's discomfort.  In the setting of her dementia did not feel continuing through her voiced discomfort was appropriate, especially as the ring is not causing any clear compromise at this time.  No indication to cut the ring off at this time either.  Discussed with husband giving the patient her fluid pill, then elevating the hand and applying ice, and attempting to remove it at home, which he feels comfortable with.  Explained should she develop any signs or symptoms concerning for compromise of the finger (swelling, change of color, increased pain) he should return to the emergency department for removal, but do not feel persistent efforts in the setting of her discomfort and dementia are indicated or appropriate at this time. He is in agreement.   Advised PCP follow-up and given return precautions.   Lilia Pro., MD 02/16/20 [phone]

## 2020-02-16 NOTE — Discharge Instructions (Signed)
Thank you for letting us take care of you in the emergency department today.   Please continue to take any regular, prescribed medications. Take a few doses of her fluid pill, then ice and elevate her hand to help get her ring off. If you still cannot remove it, please return to the ER. Come back if the finger gets more swollen, changes color, or becomes painful.   Please follow up with: Your primary care doctor to review your ER visit and follow up on your symptoms.   Please return to the ER for any new or worsening symptoms.

## 2020-02-16 NOTE — Progress Notes (Signed)
PHARMACY - PHYSICIAN COMMUNICATION CRITICAL VALUE ALERT - BLOOD CULTURE IDENTIFICATION (BCID)  Cynthia Walker is an 85 y.o. female who presented to [facility] on 02/16/2020 with a chief complaint of altered mental status  Assessment:  Staph species growing in 2 of 4 bottles (both anaerobic) - Mec A was detected so most likely Coag negative staph  - Also growing Strep species in anaerobic bottle (include suspected source if known)  - pt was asymptomatic during admission and at discharge  Name of physician (or Provider) Contacted:  Pt already discharged .  Relayed results to ED charge RN (shannon) .  Pt was treated and discharged by Dr Joan Mayans  - will secure chat results to Dr Joan Mayans so she can review   Current antibiotics: Not discharged on abx   Changes to prescribed antibiotics recommended:  Pt will need to be treated with IV vancomycin  Results for orders placed or performed during the hospital encounter of 02/16/20  Blood Culture ID Panel (Reflexed) (Collected: 02/16/2020  6:44 AM)  Result Value Ref Range   Enterococcus species NOT DETECTED NOT DETECTED   Listeria monocytogenes NOT DETECTED NOT DETECTED   Staphylococcus species DETECTED (A) NOT DETECTED   Staphylococcus aureus (BCID) NOT DETECTED NOT DETECTED   Methicillin resistance DETECTED (A) NOT DETECTED   Streptococcus species DETECTED (A) NOT DETECTED   Streptococcus agalactiae NOT DETECTED NOT DETECTED   Streptococcus pneumoniae NOT DETECTED NOT DETECTED   Streptococcus pyogenes NOT DETECTED NOT DETECTED   Acinetobacter baumannii NOT DETECTED NOT DETECTED   Enterobacteriaceae species NOT DETECTED NOT DETECTED   Enterobacter cloacae complex NOT DETECTED NOT DETECTED   Escherichia coli NOT DETECTED NOT DETECTED   Klebsiella oxytoca NOT DETECTED NOT DETECTED   Klebsiella pneumoniae NOT DETECTED NOT DETECTED   Proteus species NOT DETECTED NOT DETECTED   Serratia marcescens NOT DETECTED NOT DETECTED   Haemophilus influenzae  NOT DETECTED NOT DETECTED   Neisseria meningitidis NOT DETECTED NOT DETECTED   Pseudomonas aeruginosa NOT DETECTED NOT DETECTED   Candida albicans NOT DETECTED NOT DETECTED   Candida glabrata NOT DETECTED NOT DETECTED   Candida krusei NOT DETECTED NOT DETECTED   Candida parapsilosis NOT DETECTED NOT DETECTED   Candida tropicalis NOT DETECTED NOT DETECTED    Jestin Burbach D 02/16/2020  9:15 PM

## 2020-02-16 NOTE — ED Notes (Signed)
Dr. Corky Downs made aware of positive blood culture results. Husband contacted by this nurse and alerted that it is recommended that pt return to ED.   Husband has consulted with patient and reported they will come back to ED immediately.

## 2020-02-16 NOTE — ED Provider Notes (Signed)
[facility] Emergency Department Provider Note  ____________________________________________  Time seen: Approximately 6:37 AM  I have reviewed the triage vital signs and the nursing notes.   HISTORY  Chief Complaint Altered Mental Status  Level 5 caveat:  Portions of the history and physical were unable to be obtained due to AMS   HPI Cynthia Walker is a 85 y.o. female with a history of breast cancer status post lumpectomy, radiation and chemo in 2009, dementia, hypertension, osteoporosis who presents from home for altered mental status.  Per EMS, husband reported the patient woke up this morning more confused than baseline.  He checked her temperature and was 101.63F.  He called EMS.   Per EMS patient's temperature was 98.8.  Normal sugar normal vital signs.  Patient is very confused and oriented to self only.  Unable to provide any history.  When asked if she had received the Covid vaccine patient replied "what is that?" She denies CP, SOB, cough, abdominal pain, nausea.  Past Medical History:  Diagnosis Date  . Breast cancer [facility]) 2009   Right breast CA with lumpectomy, radiation and chemo tx's.  . Dementia ([place])   . Hypertension   . Personal history of chemotherapy   . Personal history of radiation therapy     Patient Active Problem List   Diagnosis Date Noted  . Breast pain, right 01/11/2020  . Femoral neck fracture ([place]) 06/27/2019  . Femur fracture, left ([place]) 06/26/2019  . Right lower lobe pulmonary nodule 05/04/2019  . Syncope 09/27/2017  . Atypical chest pain 09/03/2017  . Essential hypertension 09/03/2017  . Short-term memory loss 09/03/2017  . Osteoporosis 04/02/2016  . Breast cancer, right ([place]) 04/06/2008    Past Surgical History:  Procedure Laterality Date  . BREAST BIOPSY Right 03/15/2008   invasive mammary carcinoma  . BREAST LUMPECTOMY Right 04/14/2008   Pathology revealed a 1.1 cm grade I invasive ductal carcinoma.  Margins were negative.   . COLONOSCOPY WITH PROPOFOL N/A 07/10/2015   Procedure: COLONOSCOPY WITH PROPOFOL;  Surgeon: Hulen Luster, MD;  Location: [facility] ENDOSCOPY;  Service: Gastroenterology;  Laterality: N/A;  . INTRAMEDULLARY (IM) NAIL INTERTROCHANTERIC Left 06/27/2019   Procedure: INTRAMEDULLARY (IM) NAIL INTERTROCHANTRIC;  Surgeon: Creig Hines, MD;  Location: [facility] ORS;  Service: Orthopedics;  Laterality: Left;  . TONSILLECTOMY     age of 85 years old    Prior to Admission medications   Medication Sig Start Date End Date Taking? Authorizing Provider  calcium-vitamin D (OSCAL WITH D) 500-200 MG-UNIT tablet Take 1 tablet by mouth 2 (two) times daily. Patient not taking: Reported on 01/20/2020 07/01/19   [name], MD  celecoxib (CELEBREX) 200 MG capsule Take 1 capsule (200 mg total) by mouth 2 (two) times daily. Patient not taking: Reported on 01/11/2020 07/01/19   [name], MD  lidocaine (LIDODERM) 5 % Place 1 patch onto the skin every 12 (twelve) hours. Remove & Discard patch within 12 hours or as directed by MD Patient not taking: Reported on 01/11/2020 12/06/19 12/05/20  Blake Divine, MD  metoprolol tartrate (LOPRESSOR) 25 MG tablet Take 0.5 tablets (12.5 mg total) by mouth 2 (two) times daily. Patient not taking: Reported on 01/11/2020 07/01/19   [name], MD  Multiple Vitamin (MULTI-VITAMINS) TABS Take by mouth.    [provider]    Allergies Flu virus vaccine, Latex, Other, Procaine, Soy isoflavones, and Penicillins  Family History  Problem Relation Age of Onset  . Cancer Brother  lung cancer  . Cancer Sister        not sure  . Breast cancer Sister   . Heart disease Father   . Hypertension Father     Social History Social History   Tobacco Use  . Smoking status: Never Smoker  . Smokeless tobacco: Never Used  Substance Use Topics  . Alcohol use: No  . Drug use: No    Review of Systems  Constitutional: + fever and AMS ENT: Negative  for sore throat. Neck: No neck pain  Cardiovascular: Negative for chest pain. Respiratory: Negative for shortness of breath. Gastrointestinal: Negative for abdominal pain, vomiting or diarrhea. Genitourinary: Negative for dysuria. Musculoskeletal: Negative for back pain. Skin: Negative for rash. Neurological: Negative for headaches, weakness or numbness. Psych: No SI or HI  ____________________________________________   PHYSICAL EXAM:  VITAL SIGNS: ED Triage Vitals  Enc Vitals Group     BP 02/16/20 0620 129/73     Pulse Rate 02/16/20 0624 78     Resp 02/16/20 0620 20     Temp 02/16/20 0620 98.3 F (36.8 C)     Temp Source 02/16/20 0620 Oral     SpO2 02/16/20 0624 96 %     Weight 02/16/20 0620 140 lb (63.5 kg)     Height 02/16/20 0620 5\' 7"  (1.702 m)     Head Circumference --      Peak Flow --      Pain Score 02/16/20 0620 0     Pain Loc --      Pain Edu? --      Excl. in [place]? --     Constitutional: Alert and oriented to self only. Well appearing and in no apparent distress. HEENT:      Head: Normocephalic and atraumatic.         Eyes: Conjunctivae are normal. Sclera is non-icteric.       Mouth/Throat: Mucous membranes are moist.       Neck: Supple with no signs of meningismus. Cardiovascular: Regular rate and rhythm. No murmurs, gallops, or rubs. 2+ symmetrical distal pulses are present in all extremities. No JVD. Respiratory: Normal respiratory effort. Lungs are clear to auscultation bilaterally. No wheezes, crackles, or rhonchi.  Gastrointestinal: Soft, non tender, and non distended with positive bowel sounds. No rebound or guarding. Musculoskeletal: No edema, cyanosis, or erythema of extremities. Neurologic: Normal speech and language. Face is symmetric. Moving all extremities. No gross focal neurologic deficits are appreciated. Skin: Skin is warm, dry and intact. No rash noted. Psychiatric: Mood and affect are normal. Speech and behavior are  normal.  ____________________________________________   LABS (all labs ordered are listed, but only abnormal results are displayed)  Labs Reviewed  CBC WITH DIFFERENTIAL/PLATELET - Abnormal; Notable for the following components:      Result Value   RBC 3.44 (*)    Hemoglobin 10.7 (*)    HCT 31.8 (*)    All other components within normal limits  COMPREHENSIVE METABOLIC PANEL - Abnormal; Notable for the following components:   Calcium 7.8 (*)    Total Protein 5.8 (*)    Albumin 2.9 (*)    All other components within normal limits  URINALYSIS, COMPLETE (UACMP) WITH MICROSCOPIC - Abnormal; Notable for the following components:   Color, Urine YELLOW (*)    APPearance HAZY (*)    All other components within normal limits  CULTURE, BLOOD (ROUTINE X 2)  CULTURE, BLOOD (ROUTINE X 2)  LACTIC ACID, PLASMA   ____________________________________________  EKG  ED ECG REPORT I, Rudene Re, the attending physician, personally viewed and interpreted this ECG.  Normal sinus rhythm, rate of 78, normal intervals, normal axis, no ST elevations or depressions.  Unchanged from prior. ____________________________________________  RADIOLOGY  I have personally reviewed the images performed during this visit and I agree with the Radiologist's read.   Interpretation by Radiologist:  DG Chest Portable 1 View  Result Date: 02/16/2020 CLINICAL DATA:  Fever and altered mental status. EXAM: PORTABLE CHEST 1 VIEW COMPARISON:  CT chest dated January 14, 2020. Chest x-ray dated January 07, 2020. FINDINGS: The heart size and mediastinal contours are within normal limits. Normal pulmonary vascularity. Small opacity in the peripheral right middle lobe is not significantly changed by x-ray. The lungs are otherwise clear. No pleural effusion or pneumothorax. No acute osseous abnormality. IMPRESSION: 1. Unchanged small opacity in the peripheral right middle lobe. Attention on follow-up chest CT in 1-2 months as  noted on the prior CT chest from April is recommended. 2. No new active disease. Electronically Signed   By: Titus Dubin M.D.   On: 02/16/2020 06:50      ____________________________________________   PROCEDURES  Procedure(s) performed:yes .1-3 Lead EKG Interpretation Performed by: Rudene Re, MD Authorized by: Rudene Re, MD     Interpretation: normal     ECG rate assessment: normal     Rhythm: sinus rhythm     Ectopy: none     Conduction: normal     Critical Care performed:  None ____________________________________________   INITIAL IMPRESSION / ASSESSMENT AND PLAN / ED COURSE   85 y.o. female with a history of breast cancer status post lumpectomy, radiation and chemo in 2009, dementia, hypertension, osteoporosis who presents from home for altered mental status and fever.  Patient arrives to the emergency room alert and oriented to self only, in no apparent distress, normal vital signs, afebrile, normal work of breathing and normal sats, lungs are clear to auscultation, abdomen is soft and nontender, neurologically intact.  EKG showing no acute ischemic changes or dysrhythmia.  Patient placed on telemetry for close monitoring.  Old medical records reviewed.  Patient has a documented history of dementia and per review of epic seems to be confused at baseline.  Differential diagnosis is broad and includes infection versus dehydration versus dysrhythmias versus electrolyte abnormalities versus ACS versus intracranial pathology.  Plan for labs, urinalysis, head CT, chest x-ray.  _________________________ 7:12 AM on 02/16/2020 -----------------------------------------  Labs and imaging pending.  Care transferred to Dr. Joan Mayans.    _____________________________________________ Please note:  Patient was evaluated in Emergency Department today for the symptoms described in the history of present illness. Patient was evaluated in the context of the global  COVID-19 pandemic, which necessitated consideration that the patient might be at risk for infection with the SARS-CoV-2 virus that causes COVID-19. Institutional protocols and algorithms that pertain to the evaluation of patients at risk for COVID-19 are in a state of rapid change based on information released by regulatory bodies including the CDC and federal and state organizations. These policies and algorithms were followed during the patient's care in the ED.  Some ED evaluations and interventions may be delayed as a result of limited staffing during the pandemic.   [place] Controlled Substance Database was reviewed by me. ____________________________________________   FINAL CLINICAL IMPRESSION(S) / ED DIAGNOSES   Final diagnoses:  Altered mental status, unspecified altered mental status type  Fever, unspecified fever cause      NEW MEDICATIONS STARTED DURING THIS  VISIT:  ED Discharge Orders    None       Note:  This document was prepared using Dragon voice recognition software and may include unintentional dictation errors.    Alfred Levins, [place], MD 02/16/20 [phone]

## 2020-02-16 NOTE — ED Triage Notes (Signed)
Pt returning to ED after a positive blood culture yesterday. Pts status has not changed from that of discharge. Pt is confused at baseline but alert upon arrival to ED. Family at bedside.

## 2020-02-17 ENCOUNTER — Encounter: Payer: Self-pay | Admitting: Internal Medicine

## 2020-02-17 ENCOUNTER — Ambulatory Visit: Payer: Medicare HMO | Admitting: Physical Therapy

## 2020-02-17 ENCOUNTER — Other Ambulatory Visit: Payer: Self-pay

## 2020-02-17 DIAGNOSIS — Z515 Encounter for palliative care: Secondary | ICD-10-CM

## 2020-02-17 DIAGNOSIS — Z888 Allergy status to other drugs, medicaments and biological substances status: Secondary | ICD-10-CM | POA: Diagnosis not present

## 2020-02-17 DIAGNOSIS — Z9102 Food additives allergy status: Secondary | ICD-10-CM | POA: Diagnosis not present

## 2020-02-17 DIAGNOSIS — M810 Age-related osteoporosis without current pathological fracture: Secondary | ICD-10-CM | POA: Diagnosis present

## 2020-02-17 DIAGNOSIS — Z20822 Contact with and (suspected) exposure to covid-19: Secondary | ICD-10-CM | POA: Diagnosis present

## 2020-02-17 DIAGNOSIS — F0390 Unspecified dementia without behavioral disturbance: Secondary | ICD-10-CM | POA: Diagnosis present

## 2020-02-17 DIAGNOSIS — S60449A External constriction of unspecified finger, initial encounter: Secondary | ICD-10-CM | POA: Diagnosis not present

## 2020-02-17 DIAGNOSIS — Z9221 Personal history of antineoplastic chemotherapy: Secondary | ICD-10-CM | POA: Diagnosis not present

## 2020-02-17 DIAGNOSIS — Z853 Personal history of malignant neoplasm of breast: Secondary | ICD-10-CM | POA: Diagnosis not present

## 2020-02-17 DIAGNOSIS — I10 Essential (primary) hypertension: Secondary | ICD-10-CM | POA: Diagnosis present

## 2020-02-17 DIAGNOSIS — R54 Age-related physical debility: Secondary | ICD-10-CM | POA: Diagnosis present

## 2020-02-17 DIAGNOSIS — B9562 Methicillin resistant Staphylococcus aureus infection as the cause of diseases classified elsewhere: Secondary | ICD-10-CM

## 2020-02-17 DIAGNOSIS — Z887 Allergy status to serum and vaccine status: Secondary | ICD-10-CM | POA: Diagnosis not present

## 2020-02-17 DIAGNOSIS — B957 Other staphylococcus as the cause of diseases classified elsewhere: Secondary | ICD-10-CM

## 2020-02-17 DIAGNOSIS — B955 Unspecified streptococcus as the cause of diseases classified elsewhere: Secondary | ICD-10-CM

## 2020-02-17 DIAGNOSIS — R509 Fever, unspecified: Secondary | ICD-10-CM | POA: Diagnosis present

## 2020-02-17 DIAGNOSIS — M7989 Other specified soft tissue disorders: Secondary | ICD-10-CM | POA: Diagnosis present

## 2020-02-17 DIAGNOSIS — Z923 Personal history of irradiation: Secondary | ICD-10-CM | POA: Diagnosis not present

## 2020-02-17 DIAGNOSIS — Z66 Do not resuscitate: Secondary | ICD-10-CM | POA: Diagnosis present

## 2020-02-17 DIAGNOSIS — Z880 Allergy status to penicillin: Secondary | ICD-10-CM | POA: Diagnosis not present

## 2020-02-17 DIAGNOSIS — Z91040 Latex allergy status: Secondary | ICD-10-CM | POA: Diagnosis not present

## 2020-02-17 DIAGNOSIS — Z7189 Other specified counseling: Secondary | ICD-10-CM | POA: Insufficient documentation

## 2020-02-17 DIAGNOSIS — R7881 Bacteremia: Secondary | ICD-10-CM | POA: Diagnosis present

## 2020-02-17 DIAGNOSIS — Z803 Family history of malignant neoplasm of breast: Secondary | ICD-10-CM | POA: Diagnosis not present

## 2020-02-17 DIAGNOSIS — R4182 Altered mental status, unspecified: Secondary | ICD-10-CM | POA: Diagnosis present

## 2020-02-17 DIAGNOSIS — Z79899 Other long term (current) drug therapy: Secondary | ICD-10-CM | POA: Diagnosis not present

## 2020-02-17 DIAGNOSIS — Z8249 Family history of ischemic heart disease and other diseases of the circulatory system: Secondary | ICD-10-CM | POA: Diagnosis not present

## 2020-02-17 DIAGNOSIS — W4904XA Ring or other jewelry causing external constriction, initial encounter: Secondary | ICD-10-CM | POA: Diagnosis not present

## 2020-02-17 LAB — CBC WITH DIFFERENTIAL/PLATELET
Abs Immature Granulocytes: 0.02 10*3/uL (ref 0.00–0.07)
Basophils Absolute: 0 10*3/uL (ref 0.0–0.1)
Basophils Relative: 1 %
Eosinophils Absolute: 0.1 10*3/uL (ref 0.0–0.5)
Eosinophils Relative: 2 %
HCT: 33.8 % — ABNORMAL LOW (ref 36.0–46.0)
Hemoglobin: 11 g/dL — ABNORMAL LOW (ref 12.0–15.0)
Immature Granulocytes: 0 %
Lymphocytes Relative: 17 %
Lymphs Abs: 1.3 10*3/uL (ref 0.7–4.0)
MCH: 31 pg (ref 26.0–34.0)
MCHC: 32.5 g/dL (ref 30.0–36.0)
MCV: 95.2 fL (ref 80.0–100.0)
Monocytes Absolute: 0.7 10*3/uL (ref 0.1–1.0)
Monocytes Relative: 10 %
Neutro Abs: 5.4 10*3/uL (ref 1.7–7.7)
Neutrophils Relative %: 70 %
Platelets: 325 10*3/uL (ref 150–400)
RBC: 3.55 MIL/uL — ABNORMAL LOW (ref 3.87–5.11)
RDW: 11.8 % (ref 11.5–15.5)
WBC: 7.6 10*3/uL (ref 4.0–10.5)
nRBC: 0 % (ref 0.0–0.2)

## 2020-02-17 LAB — BASIC METABOLIC PANEL
Anion gap: 9 (ref 5–15)
BUN: 9 mg/dL (ref 8–23)
CO2: 26 mmol/L (ref 22–32)
Calcium: 8.3 mg/dL — ABNORMAL LOW (ref 8.9–10.3)
Chloride: 101 mmol/L (ref 98–111)
Creatinine, Ser: 0.53 mg/dL (ref 0.44–1.00)
GFR calc Af Amer: >60 mL/min (ref >60)
GFR calc non Af Amer: >60 mL/min (ref >60)
Glucose, Bld: 110 mg/dL — ABNORMAL HIGH (ref 70–99)
Potassium: 3.5 mmol/L (ref 3.5–5.1)
Sodium: 136 mmol/L (ref 135–145)

## 2020-02-17 LAB — LACTIC ACID, PLASMA: Lactic Acid, Venous: 1.6 mmol/L (ref 0.5–1.9)

## 2020-02-17 LAB — SARS CORONAVIRUS 2 (TAT 6-24 HRS): SARS Coronavirus 2: NEGATIVE

## 2020-02-17 MED ORDER — DEXTROSE-NACL 5-0.9 % IV SOLN: INTRAVENOUS | Status: DC

## 2020-02-17 MED ORDER — SERTRALINE HCL 50 MG PO TABS
25.0000 mg | ORAL_TABLET | Freq: Every day | ORAL | Status: DC
  Administered 2020-02-17 – 2020-02-20 (×4): 25 mg via ORAL
  Filled 2020-02-17 (×4): qty 1

## 2020-02-17 MED ORDER — ENOXAPARIN SODIUM 40 MG/0.4ML ~~LOC~~ SOLN
40.0000 mg | SUBCUTANEOUS | Status: DC
  Administered 2020-02-17 – 2020-02-20 (×4): 40 mg via SUBCUTANEOUS
  Filled 2020-02-17 (×4): qty 0.4

## 2020-02-17 MED ORDER — ONDANSETRON HCL 4 MG/2ML IJ SOLN: 4.0000 mg | Freq: Four times a day (QID) | INTRAMUSCULAR | Status: DC | PRN

## 2020-02-17 MED ORDER — VANCOMYCIN HCL IN DEXTROSE 1-5 GM/200ML-% IV SOLN
1000.0000 mg | INTRAVENOUS | Status: DC
  Administered 2020-02-17 – 2020-02-18 (×2): 1000 mg via INTRAVENOUS
  Filled 2020-02-17 (×3): qty 200

## 2020-02-17 MED ORDER — ACETAMINOPHEN 650 MG RE SUPP: 650.0000 mg | Freq: Four times a day (QID) | RECTAL | Status: DC | PRN

## 2020-02-17 MED ORDER — ONDANSETRON HCL 4 MG PO TABS: 4.0000 mg | ORAL_TABLET | Freq: Four times a day (QID) | ORAL | Status: DC | PRN

## 2020-02-17 MED ORDER — ACETAMINOPHEN 325 MG PO TABS
650.0000 mg | ORAL_TABLET | Freq: Four times a day (QID) | ORAL | Status: DC | PRN
  Administered 2020-02-19: 650 mg via ORAL
  Filled 2020-02-17: qty 2

## 2020-02-17 NOTE — ED Notes (Signed)
Attempted to remove pt's wedding rings w/ lubrication unsuccessfully.

## 2020-02-17 NOTE — ED Notes (Signed)
Pt was given a cup of orange juice and her husband was given 1 cup of sprite

## 2020-02-17 NOTE — Consult Note (Signed)
NAME: Cynthia Walker  DOB: [birth year]  MRN: [id number]  Date/Time: 02/17/2020 1:58 PM  REQUESTING PROVIDER: Dr. Manuella Ghazi Subjective:  REASON FOR CONSULT: Bacteremia ?No history available from patient.  Chart reviewed.  Spoke to the husband was at bedside. Cynthia Walker is a 85 y.o. female with a history of rt breast ca stage 1A s/p lumpectomy, chemo and radiation 12 yrs ago and rt lower lobe lung nodule for which she had SBRT recently, neurocognitive impairment, history of left intertrochanteric fracture s/p intramedullary nail 06/27/19 Presented to the ED 2 days ago with a temperature of 101.  As per husband patient was not feeling well.  She was not her usual self.  She was feeling hot and hence he checked her temperature and as it was more than 101 she was brought to the ED.  In the ED she was assessed and as there was no abnormal findings blood cultures were taken and she was discharged home.  She was called back yesterday because the blood culture had gram-positive cocci.  And I am seeing the patient for the same. As per her husband patient at baseline is wheelchair-bound.  She provides care for her.  She is also confused and has poor memory. He denies any cough or shortness of breath or abdominal pain or diarrhea or difficulty passing urine.  She does have urinary incontinence and wears  pads Past Medical History:  Diagnosis Date  . Breast cancer [facility]) 2009   Right breast CA with lumpectomy, radiation and chemo tx's.  . Dementia ([employer] [facility] [employer])   . Hypertension   . Personal history of chemotherapy   . Personal history of radiation therapy     Past Surgical History:  Procedure Laterality Date  . BREAST BIOPSY Right 03/15/2008   invasive mammary carcinoma  . BREAST LUMPECTOMY Right 04/14/2008   Pathology revealed a 1.1 cm grade I invasive ductal carcinoma. Margins were negative.   . COLONOSCOPY WITH PROPOFOL N/A 07/10/2015   Procedure: COLONOSCOPY WITH PROPOFOL;  Surgeon: Hulen Luster, MD;   Location: [facility] ENDOSCOPY;  Service: Gastroenterology;  Laterality: N/A;  . INTRAMEDULLARY (IM) NAIL INTERTROCHANTERIC Left 06/27/2019   Procedure: INTRAMEDULLARY (IM) NAIL INTERTROCHANTRIC;  Surgeon: Creig Hines, MD;  Location: [facility] ORS;  Service: Orthopedics;  Laterality: Left;  . TONSILLECTOMY     age of 85 years old    Social History   Socioeconomic History  . Marital status: Married    Spouse name: Not on file  . Number of children: Not on file  . Years of education: Not on file  . Highest education level: Not on file  Occupational History  . Not on file  Tobacco Use  . Smoking status: Never Smoker  . Smokeless tobacco: Never Used  Substance and Sexual Activity  . Alcohol use: No  . Drug use: No  . Sexual activity: Not on file  Other Topics Concern  . Not on file  Social History Narrative  . Not on file   Social Determinants of Health   Financial Resource Strain:   . Difficulty of Paying Living Expenses:   Food Insecurity:   . Worried About Charity fundraiser in the Last Year:   . Arboriculturist in the Last Year:   Transportation Needs:   . Film/video editor (Medical):   Marland Kitchen Lack of Transportation (Non-Medical):   Physical Activity:   . Days of Exercise per Week:   . Minutes of Exercise per Session:   Stress:   .  Feeling of Stress :   Social Connections:   . Frequency of Communication with Friends and Family:   . Frequency of Social Gatherings with Friends and Family:   . Attends Religious Services:   . Active Member of Clubs or Organizations:   . Attends Archivist Meetings:   Marland Kitchen Marital Status:   Intimate Partner Violence:   . Fear of Current or Ex-Partner:   . Emotionally Abused:   Marland Kitchen Physically Abused:   . Sexually Abused:     Family History  Problem Relation Age of Onset  . Cancer Brother        lung cancer  . Cancer Sister        not sure  . Breast cancer Sister   . Heart disease Father   . Hypertension Father    Allergies   Allergen Reactions  . Flu Virus Vaccine     Other reaction(s): Other (See Comments) Stomach cramps  . Latex Swelling  . Other     Other reaction(s): Unknown Allergy to eggs  . Procaine Other (See Comments)    weakness  . Soy Isoflavones     Other reaction(s): Other (See Comments) Soy causes legs to feel like rubber  . Penicillins Rash    Has patient had a PCN reaction causing immediate rash, facial/tongue/throat swelling, SOB or lightheadedness with hypotension: No Has patient had a PCN reaction causing severe rash involving mucus membranes or skin necrosis: No Has patient had a PCN reaction that required hospitalization: No Has patient had a PCN reaction occurring within the last 10 years: No If all of the above answers are "NO", then may proceed with Cephalosporin use.     Current Facility-Administered Medications  Medication Dose Route Frequency Provider Last Rate Last Admin  . acetaminophen (TYLENOL) tablet 650 mg  650 mg Oral Q6H PRN Athena Masse, MD       Or  . acetaminophen (TYLENOL) suppository 650 mg  650 mg Rectal Q6H PRN Athena Masse, MD      . dextrose 5 %-0.9 % sodium chloride infusion   Intravenous Continuous Athena Masse, MD   Stopped at 02/17/20 [phone]  . enoxaparin (LOVENOX) injection 40 mg  40 mg Subcutaneous Q24H Judd Gaudier V, MD      . ondansetron [facility]) tablet 4 mg  4 mg Oral Q6H PRN Athena Masse, MD       Or  . ondansetron [facility]) injection 4 mg  4 mg Intravenous Q6H PRN Athena Masse, MD      . vancomycin (VANCOCIN) IVPB 1000 mg/200 mL premix  1,000 mg Intravenous Q24H Hart Robinsons A, RPH       Current Outpatient Medications  Medication Sig Dispense Refill  . furosemide (LASIX) 20 MG tablet Take 20 mg by mouth daily.    . sertraline (ZOLOFT) 50 MG tablet Take 50 mg by mouth daily.    . calcium-vitamin D (OSCAL WITH D) 500-200 MG-UNIT tablet Take 1 tablet by mouth 2 (two) times daily. (Patient not taking: Reported on 01/20/2020)    .  celecoxib (CELEBREX) 200 MG capsule Take 1 capsule (200 mg total) by mouth 2 (two) times daily. (Patient not taking: Reported on 01/11/2020)    . donepezil (ARICEPT) 5 MG tablet Take 5 mg by mouth daily.    Marland Kitchen lidocaine (LIDODERM) 5 % Place 1 patch onto the skin every 12 (twelve) hours. Remove & Discard patch within 12 hours or as directed by MD (Patient not taking:  Reported on 01/11/2020) 10 patch 0  . metoprolol tartrate (LOPRESSOR) 25 MG tablet Take 0.5 tablets (12.5 mg total) by mouth 2 (two) times daily. (Patient not taking: Reported on 01/11/2020)    . Multiple Vitamin (MULTI-VITAMINS) TABS Take by mouth.       Abtx:  Anti-infectives (From admission, onward)   Start     Dose/Rate Route Frequency Ordered Stop   02/17/20 1500  vancomycin (VANCOCIN) IVPB 1000 mg/200 mL premix     1,000 mg 200 mL/hr over 60 Minutes Intravenous Every 24 hours 02/17/20 0244     02/16/20 2330  ceFEPIme (MAXIPIME) 1 g in sodium chloride 0.9 % 100 mL IVPB     1 g 200 mL/hr over 30 Minutes Intravenous  Once 02/16/20 2329 02/17/20 0239   02/16/20 2330  vancomycin (VANCOCIN) IVPB 1000 mg/200 mL premix     1,000 mg 200 mL/hr over 60 Minutes Intravenous  Once 02/16/20 2329 02/17/20 0239      REVIEW OF SYSTEMS:  NA  Objective:  VITALS:  BP 119/68   Pulse 65   Temp 97.6 F (36.4 C) (Oral)   Resp 16   Ht 5\' 4"  (1.626 m)   Wt 59 kg   SpO2 96%   BMI 22.31 kg/m  PHYSICAL EXAM:  General: Lethargic on calling her name she responds to questions but she does not want to be examined.  She is irritable.  She is oriented in person and place. Head: Normocephalic, without obvious abnormality, atraumatic. Eyes: Could not examine ENT Nares normal. No drainage or sinus tenderness. Cannot examine oral cavity Neck: Supple, . Back: Did not examine as patient was not cooperative. Lungs: Bilateral air entry heart:  Regular rate and rhythm, no murmur, rub or gallop. Abdomen: Soft, non-tender,not distended. Bowel sounds  normal. No masses Extremities: Bilateral ankle edema.  No obvious lesions. Skin: Dryness and mild pinkish erythema over the chest wall Lymph: Cervical, supraclavicular normal. Neurologic: Moves all limbs Pertinent Labs Lab Results CBC    Component Value Date/Time   WBC 7.6 02/17/2020 0032   RBC 3.55 (L) 02/17/2020 0032   HGB 11.0 (L) 02/17/2020 0032   HGB 14.4 11/25/2013 1031   HCT 33.8 (L) 02/17/2020 0032   HCT 42.4 11/25/2013 1031   PLT 325 02/17/2020 0032   PLT 258 11/25/2013 1031   MCV 95.2 02/17/2020 0032   MCV 97 11/25/2013 1031   MCH 31.0 02/17/2020 0032   MCHC 32.5 02/17/2020 0032   RDW 11.8 02/17/2020 0032   RDW 12.8 11/25/2013 1031   LYMPHSABS 1.3 02/17/2020 0032   LYMPHSABS 1.5 11/25/2013 1031   MONOABS 0.7 02/17/2020 0032   MONOABS 0.5 11/25/2013 1031   EOSABS 0.1 02/17/2020 0032   EOSABS 0.0 11/25/2013 1031   BASOSABS 0.0 02/17/2020 0032   BASOSABS 0.1 11/25/2013 1031    CMP Latest Ref Rng & Units 02/17/2020 02/16/2020 01/11/2020  Glucose 70 - 99 mg/dL 110(H) 99 -  BUN 8 - 23 mg/dL 9 14 -  Creatinine 0.44 - 1.00 mg/dL 0.53 0.57 -  Sodium 135 - 145 mmol/L 136 136 -  Potassium 3.5 - 5.1 mmol/L 3.5 3.9 -  Chloride 98 - 111 mmol/L 101 104 -  CO2 22 - 32 mmol/L 26 26 -  Calcium 8.9 - 10.3 mg/dL 8.3(L) 7.8(L) -  Total Protein 6.5 - 8.1 g/dL - 5.8(L) 7.0  Total Bilirubin 0.3 - 1.2 mg/dL - 1.0 0.3  Alkaline Phos 38 - 126 U/L - 67 96  AST 15 -  41 U/L - 20 18  ALT 0 - 44 U/L - 9 13      Microbiology: Recent Results (from the past 240 hour(s))  Blood culture (routine x 2)     Status: None (Preliminary result)   Collection Time: 02/16/20  6:44 AM   Specimen: BLOOD LEFT WRIST  Result Value Ref Range Status   Specimen Description BLOOD LEFT WRIST  Final   Special Requests   Final    BOTTLES DRAWN AEROBIC AND ANAEROBIC Blood Culture results may not be optimal due to an excessive volume of blood received in culture bottles   Culture  Setup Time   Final    GRAM  POSITIVE COCCI IN BOTH AEROBIC AND ANAEROBIC BOTTLES CRITICAL RESULT CALLED TO, READ BACK BY AND VERIFIED WITH: JASON ROBBINS ON 02/16/20 AT 2038 QSD Performed at [facility], [address], [place], [place] [address]    Culture GRAM POSITIVE COCCI  Final   Report Status PENDING  Incomplete  Blood culture (routine x 2)     Status: None (Preliminary result)   Collection Time: 02/16/20  6:44 AM   Specimen: BLOOD RIGHT FOREARM  Result Value Ref Range Status   Specimen Description BLOOD RIGHT FOREARM  Final   Special Requests   Final    BOTTLES DRAWN AEROBIC AND ANAEROBIC Blood Culture results may not be optimal due to an excessive volume of blood received in culture bottles   Culture  Setup Time   Final    Organism ID to follow GRAM POSITIVE COCCI IN BOTH AEROBIC AND ANAEROBIC BOTTLES CRITICAL RESULT CALLED TO, READ BACK BY AND VERIFIED WITH: JASON ROBBINS ON 02/16/20 AT 2038 QSD Performed at [facility], [address] [place], [place] [address]    Culture GRAM POSITIVE COCCI  Final   Report Status PENDING  Incomplete  Blood Culture ID Panel (Reflexed)     Status: Abnormal   Collection Time: 02/16/20  6:44 AM  Result Value Ref Range Status   Enterococcus species NOT DETECTED NOT DETECTED Final   Listeria monocytogenes NOT DETECTED NOT DETECTED Final   Staphylococcus species DETECTED (A) NOT DETECTED Final    Comment: Methicillin (oxacillin) resistant coagulase negative staphylococcus. Possible blood culture contaminant (unless isolated from more than one blood culture draw or clinical case suggests pathogenicity). No antibiotic treatment is indicated for blood  culture contaminants. CRITICAL RESULT CALLED TO, READ BACK BY AND VERIFIED WITH: JASON ROBBINS ON 02/16/20 AT 2038 QSD    Staphylococcus aureus (BCID) NOT DETECTED NOT DETECTED Final   Methicillin resistance DETECTED (A) NOT DETECTED Final    Comment: CRITICAL RESULT CALLED TO, READ BACK BY AND VERIFIED  WITH: JASON ROBBINS ON 02/16/20 AT 2038 QSD    Streptococcus species DETECTED (A) NOT DETECTED Final    Comment: Not Enterococcus species, Streptococcus agalactiae, Streptococcus pyogenes, or Streptococcus pneumoniae. CRITICAL RESULT CALLED TO, READ BACK BY AND VERIFIED WITH: JASON ROBBINS ON 02/16/20 AT 2038 QSD    Streptococcus agalactiae NOT DETECTED NOT DETECTED Final   Streptococcus pneumoniae NOT DETECTED NOT DETECTED Final   Streptococcus pyogenes NOT DETECTED NOT DETECTED Final   Acinetobacter baumannii NOT DETECTED NOT DETECTED Final   Enterobacteriaceae species NOT DETECTED NOT DETECTED Final   Enterobacter cloacae complex NOT DETECTED NOT DETECTED Final   Escherichia coli NOT DETECTED NOT DETECTED Final   Klebsiella oxytoca NOT DETECTED NOT DETECTED Final   Klebsiella pneumoniae NOT DETECTED NOT DETECTED Final   Proteus species NOT DETECTED NOT DETECTED Final  Serratia marcescens NOT DETECTED NOT DETECTED Final   Haemophilus influenzae NOT DETECTED NOT DETECTED Final   Neisseria meningitidis NOT DETECTED NOT DETECTED Final   Pseudomonas aeruginosa NOT DETECTED NOT DETECTED Final   Candida albicans NOT DETECTED NOT DETECTED Final   Candida glabrata NOT DETECTED NOT DETECTED Final   Candida krusei NOT DETECTED NOT DETECTED Final   Candida parapsilosis NOT DETECTED NOT DETECTED Final   Candida tropicalis NOT DETECTED NOT DETECTED Final    Comment: Performed at [facility], [address] [place], [place] [address]  Blood culture (routine x 2)     Status: None (Preliminary result)   Collection Time: 02/17/20 12:32 AM   Specimen: Left Antecubital; Blood  Result Value Ref Range Status   Specimen Description LEFT ANTECUBITAL  Final   Special Requests   Final    BOTTLES DRAWN AEROBIC AND ANAEROBIC Blood Culture results may not be optimal due to an excessive volume of blood received in culture bottles   Culture   Final    NO GROWTH < 12 HOURS Performed at [facility], [address] [place], [place] [address]    Report Status PENDING  Incomplete  Blood culture (routine x 2)     Status: None (Preliminary result)   Collection Time: 02/17/20 12:32 AM   Specimen: BLOOD LEFT HAND  Result Value Ref Range Status   Specimen Description BLOOD LEFT HAND  Final   Special Requests   Final    BOTTLES DRAWN AEROBIC AND ANAEROBIC Blood Culture adequate volume   Culture   Final    NO GROWTH < 12 HOURS Performed at [facility], [address] [place], [place] [address]    Report Status PENDING  Incomplete  SARS CORONAVIRUS 2 (TAT 6-24 HRS) Nasopharyngeal Nasopharyngeal Swab     Status: None   Collection Time: 02/17/20 12:49 AM   Specimen: Nasopharyngeal Swab  Result Value Ref Range Status   SARS Coronavirus 2 NEGATIVE NEGATIVE Final    Comment: (NOTE) SARS-CoV-2 target nucleic acids are NOT DETECTED. The SARS-CoV-2 RNA is generally detectable in upper and lower respiratory specimens during the acute phase of infection. Negative results do not preclude SARS-CoV-2 infection, do not rule out co-infections with other pathogens, and should not be used as the sole basis for treatment or other patient management decisions. Negative results must be combined with clinical observations, patient history, and epidemiological information. The expected result is Negative. Fact Sheet for Patients: SugarRoll.be Fact Sheet for Healthcare Providers: https://www.woods-mathews.com/ This test is not yet approved or cleared by the [place] FDA and  has been authorized for detection and/or diagnosis of SARS-CoV-2 by FDA under an Emergency Use Authorization (EUA). This EUA will remain  in effect (meaning this test can be used) for the duration of the COVID-19 declaration under Section 56 4(b)(1) of the Act, 21 U.S.C. section 360bbb-3(b)(1), unless the authorization is terminated or revoked sooner. Performed  at [facility], [place] [address], [place], [place] [address]     IMAGING RESULTS:  I have personally reviewed the films ? Impression/Recommendation ? ?85 year old female with history of right lower lobe lung nodule being treated as CA lung with SBRT   Gram-positive cocci bacteremia she has both coag negative staph and Streptococcus.  Unclear source very likely skin but no obvious lesions. ?Examination has been limited because of patient being uncooperative. She is currently on vancomycin will continue that. She does not have a cardiac device The left hip fracture  surgical site is healed  Normal creatinine, normal WBC and normal LFTs  History of right CA breast 12 years ago status post lumpectomy, chemo and radiation.  No evidence of cellulitis of the breast  Neurocognitive impairment/dementia  Discussed the management with her husband and hospitalist ___________________________________________________  Note:  This document was prepared using Dragon voice recognition software and may include unintentional dictation errors.

## 2020-02-17 NOTE — ED Notes (Signed)
Pt cleaned of wet/soiled brief. New sheet, brief and chuck placed.

## 2020-02-17 NOTE — ED Notes (Signed)
Pt resting, husband at bedside.

## 2020-02-17 NOTE — ED Notes (Signed)
Pt resting in bed at this time, introduced myself to pt's daughter.

## 2020-02-17 NOTE — H&P (Signed)
History and Physical    Cynthia Walker VPX:[id number] DOB: [birth year] DOA: 02/16/2020  PCP: Sofie Hartigan, MD   Patient coming from: home  I have personally briefly reviewed patient's old medical records in [place]  Chief Complaint: Fever, positive blood culture callback  HPI: Cynthia Walker is a 85 y.o. female with medical history significant for hypertension, early dementia and history of breast cancer status post right lumpectomy, radiation and chemo in 2009, osteoporosis who was seen in the emergency room yesterday on 02/16/2020, for fever.  Her husband notes that she was more confused than her baseline when she awoke that morning and temperature was 101.2.  By arrival in the emergency room she was afebrile and her work-up was unremarkable with normal WBC, normal lactic acid, negative UA and negative chest x-ray.  Blood cultures were drawn and preliminary results showed methicillin-resistant gram-positive cocci in both aerobic and anaerobic bottles and strep species and aerobic bottle.  According to husband at bedside, patient has been at baseline since leaving the emergency room the day prior.  She had no more fever had no complaints of cough or shortness of breath, abdominal pain vomiting diarrhea or dysuria.  History is limited due to patient's dementia   ED Course: On her return to the emergency room, work-up still for the most part unremarkable.  She is afebrile and with normal vitals,, WBC still normal at 7600, lactic acid is 1.6, up from 1.1 yesterday.  She was recultured, started on vancomycin and cefepime.  Hospitalist consulted for admission.,.  Review of Systems: As per HPI otherwise 10 point review of systems negative.    Past Medical History:  Diagnosis Date  . Breast cancer [facility]) 2009   Right breast CA with lumpectomy, radiation and chemo tx's.  . Dementia ([place])   . Hypertension   . Personal history of chemotherapy   . Personal history of radiation  therapy     Past Surgical History:  Procedure Laterality Date  . BREAST BIOPSY Right 03/15/2008   invasive mammary carcinoma  . BREAST LUMPECTOMY Right 04/14/2008   Pathology revealed a 1.1 cm grade I invasive ductal carcinoma. Margins were negative.   . COLONOSCOPY WITH PROPOFOL N/A 07/10/2015   Procedure: COLONOSCOPY WITH PROPOFOL;  Surgeon: Hulen Luster, MD;  Location: [facility] ENDOSCOPY;  Service: Gastroenterology;  Laterality: N/A;  . INTRAMEDULLARY (IM) NAIL INTERTROCHANTERIC Left 06/27/2019   Procedure: INTRAMEDULLARY (IM) NAIL INTERTROCHANTRIC;  Surgeon: Creig Hines, MD;  Location: [facility] ORS;  Service: Orthopedics;  Laterality: Left;  . TONSILLECTOMY     age of 85 years old     reports that she has never smoked. She has never used smokeless tobacco. She reports that she does not drink alcohol or use drugs.  Allergies  Allergen Reactions  . Flu Virus Vaccine     Other reaction(s): Other (See Comments) Stomach cramps  . Latex Swelling  . Other     Other reaction(s): Unknown Allergy to eggs  . Procaine Other (See Comments)    weakness  . Soy Isoflavones     Other reaction(s): Other (See Comments) Soy causes legs to feel like rubber  . Penicillins Rash    Has patient had a PCN reaction causing immediate rash, facial/tongue/throat swelling, SOB or lightheadedness with hypotension: No Has patient had a PCN reaction causing severe rash involving mucus membranes or skin necrosis: No Has patient had a PCN reaction that required hospitalization: No Has patient had a PCN reaction occurring within the last  10 years: No If all of the above answers are "NO", then may proceed with Cephalosporin use.     Family History  Problem Relation Age of Onset  . Cancer Brother        lung cancer  . Cancer Sister        not sure  . Breast cancer Sister   . Heart disease Father   . Hypertension Father      Prior to Admission medications   Medication Sig Start Date End Date Taking?  Authorizing Provider  calcium-vitamin D (OSCAL WITH D) 500-200 MG-UNIT tablet Take 1 tablet by mouth 2 (two) times daily. Patient not taking: Reported on 01/20/2020 07/01/19   [name], MD  celecoxib (CELEBREX) 200 MG capsule Take 1 capsule (200 mg total) by mouth 2 (two) times daily. Patient not taking: Reported on 01/11/2020 07/01/19   [name], MD  lidocaine (LIDODERM) 5 % Place 1 patch onto the skin every 12 (twelve) hours. Remove & Discard patch within 12 hours or as directed by MD Patient not taking: Reported on 01/11/2020 12/06/19 12/05/20  Blake Divine, MD  metoprolol tartrate (LOPRESSOR) 25 MG tablet Take 0.5 tablets (12.5 mg total) by mouth 2 (two) times daily. Patient not taking: Reported on 01/11/2020 07/01/19   [name], MD  Multiple Vitamin (MULTI-VITAMINS) TABS Take by mouth.    [provider]    Physical Exam: Vitals:   02/16/20 2330 02/17/20 0000 02/17/20 0059 02/17/20 0100  BP: 135/77 135/67  138/82  Pulse: 69 70  71  Resp: 20 17  16   Temp:      TempSrc:      SpO2: 99% 100% 100% 99%  Weight:      Height:         Vitals:   02/16/20 2330 02/17/20 0000 02/17/20 0059 02/17/20 0100  BP: 135/77 135/67  138/82  Pulse: 69 70  71  Resp: 20 17  16   Temp:      TempSrc:      SpO2: 99% 100% 100% 99%  Weight:      Height:        Constitutional: Alert and awake, oriented x3, not in any acute distress. Eyes: PERLA, EOMI, irises appear normal, anicteric sclera,  ENMT: external ears and nose appear normal, normal hearing             Lips appears normal, oropharynx mucosa, tongue, posterior pharynx appear normal  Neck: neck appears normal, no masses, normal ROM, no thyromegaly, no JVD  CVS: S1-S2 clear, no murmur rubs or gallops,  , no carotid bruits, pedal pulses palpable, No LE edema Respiratory:  clear to auscultation bilaterally, no wheezing, rales or rhonchi. Respiratory effort normal. No accessory muscle use.  Abdomen: soft nontender,  nondistended, normal bowel sounds, no hepatosplenomegaly, no hernias Musculoskeletal: : no cyanosis, clubbing , no contractures or atrophy Neuro: Cranial nerves II-XII intact, sensation, reflexes normal, strength Psych: judgement and insight appear normal, stable mood and affect,  Skin: no rashes or lesions or ulcers, no induration or nodules   Labs on Admission: I have personally reviewed following labs and imaging studies  CBC: Recent Labs  Lab 02/16/20 0625 02/17/20 0032  WBC 6.7 7.6  NEUTROABS 4.7 5.4  HGB 10.7* 11.0*  HCT 31.8* 33.8*  MCV 92.4 95.2  PLT 305 185   Basic Metabolic Panel: Recent Labs  Lab 02/16/20 0625 02/17/20 0032  NA 136 136  K 3.9 3.5  CL 104 101  CO2  26 26  GLUCOSE 99 110*  BUN 14 9  CREATININE 0.57 0.53  CALCIUM 7.8* 8.3*   GFR: Estimated Creatinine Clearance: 46.8 mL/min (by C-G formula based on SCr of 0.53 mg/dL). Liver Function Tests: Recent Labs  Lab 02/16/20 0625  AST 20  ALT 9  ALKPHOS 67  BILITOT 1.0  PROT 5.8*  ALBUMIN 2.9*   No results for input(s): LIPASE, AMYLASE in the last 168 hours. No results for input(s): AMMONIA in the last 168 hours. Coagulation Profile: No results for input(s): INR, PROTIME in the last 168 hours. Cardiac Enzymes: No results for input(s): CKTOTAL, CKMB, CKMBINDEX, TROPONINI in the last 168 hours. BNP (last 3 results) No results for input(s): PROBNP in the last 8760 hours. HbA1C: No results for input(s): HGBA1C in the last 72 hours. CBG: No results for input(s): GLUCAP in the last 168 hours. Lipid Profile: No results for input(s): CHOL, HDL, LDLCALC, TRIG, CHOLHDL, LDLDIRECT in the last 72 hours. Thyroid Function Tests: No results for input(s): TSH, T4TOTAL, FREET4, T3FREE, THYROIDAB in the last 72 hours. Anemia Panel: No results for input(s): VITAMINB12, FOLATE, FERRITIN, TIBC, IRON, RETICCTPCT in the last 72 hours. Urine analysis:    Component Value Date/Time   COLORURINE YELLOW (A)  02/16/2020 0644   APPEARANCEUR HAZY (A) 02/16/2020 0644   APPEARANCEUR Clear 05/06/2013 1321   LABSPEC 1.019 02/16/2020 0644   LABSPEC 1.005 05/06/2013 1321   PHURINE 5.0 02/16/2020 0644   GLUCOSEU NEGATIVE 02/16/2020 0644   GLUCOSEU Negative 05/06/2013 1321   [place] 02/16/2020 [place] 02/16/2020 0644   BILIRUBINUR Negative 05/06/2013 1321   KETONESUR NEGATIVE 02/16/2020 0644   PROTEINUR NEGATIVE 02/16/2020 0644   NITRITE NEGATIVE 02/16/2020 0644   LEUKOCYTESUR NEGATIVE 02/16/2020 0644   LEUKOCYTESUR 2+ 05/06/2013 1321    Radiological Exams on Admission: CT Head Wo Contrast  Result Date: 02/16/2020 CLINICAL DATA:  Cephalopathy. EXAM: CT HEAD WITHOUT CONTRAST TECHNIQUE: Contiguous axial images were obtained from the base of the skull through the vertex without intravenous contrast. COMPARISON:  02/08/2019 FINDINGS: Brain: No evidence of acute infarction, hemorrhage, hydrocephalus, extra-axial collection or mass lesion/mass effect. Advanced atrophy with ventriculomegaly. Chronic small vessel ischemia in the deep white matter with remote lacunar infarct at the right thalamus. Vascular: No hyperdense vessel or unexpected calcification. Skull: Normal. Negative for fracture or focal lesion. Sinuses/Orbits: No acute finding. IMPRESSION: 1. No acute finding. 2. Advanced cerebral atrophy. 3. Chronic small vessel ischemia including remote right thalamic infarct. Electronically Signed   By: Monte Fantasia M.D.   On: 02/16/2020 07:18   DG Chest Portable 1 View  Result Date: 02/16/2020 CLINICAL DATA:  Fever and altered mental status. EXAM: PORTABLE CHEST 1 VIEW COMPARISON:  CT chest dated January 14, 2020. Chest x-ray dated January 07, 2020. FINDINGS: The heart size and mediastinal contours are within normal limits. Normal pulmonary vascularity. Small opacity in the peripheral right middle lobe is not significantly changed by x-ray. The lungs are otherwise clear. No pleural  effusion or pneumothorax. No acute osseous abnormality. IMPRESSION: 1. Unchanged small opacity in the peripheral right middle lobe. Attention on follow-up chest CT in 1-2 months as noted on the prior CT chest from April is recommended. 2. No new active disease. Electronically Signed   By: Titus Dubin M.D.   On: 02/16/2020 06:50    EKG: Independently reviewed.   Assessment/Plan Principal Problem:   Streptococcal bacteremia   MRSA bacteremia -Patient presented initially to the emergency room on 02/16/2020 with confusion  above baseline and recorded temperature at home of 101.2 but with negative work-up in the emergency room -Blood cultures growing methicillin-resistant gram-positive cocci in both aerobic and anaerobic bottles as well as streptococcal species -Continue IV vancomycin -Follow cultures -No focalizing signs of infection -Covid test pending    Essential hypertension -Continue home metoprolol pending med rec  Dementia without behavioral disturbance -Not currently on medication.  Followed by neurology    History of breast cancer status post lumpectomy, radiation and chemotherapy -Patient does not have a port -No acute concerns    DVT prophylaxis: Lovenox  Code Status: full code  Family Communication: Husband at bedside  disposition Plan: Back to previous home environment Consults called: none  Status:obs    Athena Masse MD [facility]     02/17/2020, 2:14 AM

## 2020-02-17 NOTE — Progress Notes (Signed)
Pharmacy Antibiotic Note  Cynthia Walker is a 85 y.o. female admitted on 02/16/2020 with bacteremia.  Pharmacy has been consulted for Vancomycin dosing.  Plan: Vancomycin 1000 mg IV Q 24 hrs. Goal AUC 400-550. Expected AUC: 544.2, Css min 13.5 SCr used: 0.8   Height: 5\' 4"  (162.6 cm) Weight: 59 kg (130 lb) IBW/kg (Calculated) : 54.7  Temp (24hrs), Avg:98 F (36.7 C), Min:97.6 F (36.4 C), Max:98.3 F (36.8 C)  Recent Labs  Lab 02/16/20 0625 02/16/20 0644 02/17/20 0032  WBC 6.7  --  7.6  CREATININE 0.57  --  0.53  LATICACIDVEN  --  1.1 1.6    Estimated Creatinine Clearance: 46.8 mL/min (by C-G formula based on SCr of 0.53 mg/dL).    Allergies  Allergen Reactions  . Flu Virus Vaccine     Other reaction(s): Other (See Comments) Stomach cramps  . Latex Swelling  . Other     Other reaction(s): Unknown Allergy to eggs  . Procaine Other (See Comments)    weakness  . Soy Isoflavones     Other reaction(s): Other (See Comments) Soy causes legs to feel like rubber  . Penicillins Rash    Has patient had a PCN reaction causing immediate rash, facial/tongue/throat swelling, SOB or lightheadedness with hypotension: No Has patient had a PCN reaction causing severe rash involving mucus membranes or skin necrosis: No Has patient had a PCN reaction that required hospitalization: No Has patient had a PCN reaction occurring within the last 10 years: No If all of the above answers are "NO", then may proceed with Cephalosporin use.     Antimicrobials this admission:   >>    >>   Dose adjustments this admission:   Microbiology results:  BCx:   UCx:    Sputum:    MRSA PCR:   Thank you for allowing pharmacy to be a part of this patient's care.  Hart Robinsons A 02/17/2020 2:45 AM

## 2020-02-17 NOTE — Consult Note (Signed)
Consultation Note Date: 02/17/2020   Patient Name: Cynthia Walker  DOB: 07/31/1935  MRN: [id number]  Age / Sex: 85 y.o., female  PCP: Sofie Hartigan, MD Referring Physician: Max Sane, MD  Reason for Consultation: Establishing goals of care  HPI/Patient Profile: 85 y.o. female  with past medical history of dementia, hypertension, right breast cancer with lumpectomy, chemo and radiation 2009, left hip fracture with nailing September 2020 admitted on 02/16/2020 with methicillin-resistant gram-positive cocci in both aerobic and anaerobic blood cultures and strep species in aerobic bottle.  Being treated with IV vancomycin.  Clinical Assessment and Goals of Care: Mrs. Ruvalcaba is resting quietly on the stretcher in the ED.  She will make an somewhat keep eye contact.  She appears frail.  She is able to tell me her name, but not where we are.  I believe she is able to make her basic needs known.  Present at bedside as husband of 71 years, Venetta Knee.  We talked about her acute health issue, bacteremia.  We talked about the treatment plan in detail.  Mr. Hehr tells me that they had [facility] home health physical therapy, but were discharged.  He states that they were seeing [facility] rehab outpatient services in [place], second visit was to be today.  Mr. Gutknecht shares that he does not think that his wife would do well in SNF rehab even if she qualifies due to memory loss.  We talked about the seriousness of bacteremia.  We talked about time for outcomes.  Mr. Asebedo states that he and his wife live alone in their home with their dog.  He states they have 3 children total, 2 live locally.  Mr. Goodnow assists Mrs. Turney with bathing and dressing.  Mr. Matthew states that Mrs. Donner was started on 25 mg of Zoloft daily for 5 days ago.   Mr. Erney states that Mrs. Crispen has been having swelling in her left arm  and leg.  He tells me that she was started on a fluid pill recently, and has taken 3.  Mr. Assad shares that his wife had taken off her wedding ring set, he believes now due to discomfort/swelling.  Although it seems that her rings are not occluding blood flow, she is at high risk for this to happen.  If possible nursing staff to please assist Mrs. Slavick in removing rings.  I encourage Mr. Dreyfuss to help his wife keep her hand/arm elevated.  We talked about CODE STATUS.  Mr. Fineberg states that they have a living will, and would not want extraordinary measures.  CODE STATUS changed.  Conference with attending, bedside nursing staff related to patient condition, needs, goals of care, disposition.  PMT to follow.   HCPOA    NEXT OF KIN -spouse, Marielena Harvell.  Daughter, Elicia Lamp.     SUMMARY OF RECOMMENDATIONS   Time for outcomes Treat the treatable DNR status Unlikely to accept SNF rehab, home health instead   Code Status/Advance Care Planning:  DNR   Symptom  Management:   Per hospitalist, no additional needs at this time.  Palliative Prophylaxis:   Oral Care and Turn Reposition  Additional Recommendations (Limitations, Scope, Preferences):  Treat the treatable but no extraordinary measures  Psycho-social/Spiritual:   Desire for further Chaplaincy support:no  Additional Recommendations: Caregiving  Support/Resources and Education on Hospice  Prognosis:   Unable to determine, based on outcomes.  6 months or less would not be surprising based on functional decline, frailty, bacteremia.  Discharge Planning: To be determined, based on outcomes.  Anticipate home with home health due to memory loss.      Primary Diagnoses: Present on Admission: . Essential hypertension . Bacteremia   I have reviewed the medical record, interviewed the patient and family, and examined the patient. The following aspects are pertinent.  Past Medical History:  Diagnosis Date  .  Breast cancer [facility]) 2009   Right breast CA with lumpectomy, radiation and chemo tx's.  . Dementia ([place])   . Hypertension   . Personal history of chemotherapy   . Personal history of radiation therapy    Social History   Socioeconomic History  . Marital status: Married    Spouse name: Not on file  . Number of children: Not on file  . Years of education: Not on file  . Highest education level: Not on file  Occupational History  . Not on file  Tobacco Use  . Smoking status: Never Smoker  . Smokeless tobacco: Never Used  Substance and Sexual Activity  . Alcohol use: No  . Drug use: No  . Sexual activity: Not on file  Other Topics Concern  . Not on file  Social History Narrative  . Not on file   Social Determinants of Health   Financial Resource Strain:   . Difficulty of Paying Living Expenses:   Food Insecurity:   . Worried About Charity fundraiser in the Last Year:   . Arboriculturist in the Last Year:   Transportation Needs:   . Film/video editor (Medical):   Marland Kitchen Lack of Transportation (Non-Medical):   Physical Activity:   . Days of Exercise per Week:   . Minutes of Exercise per Session:   Stress:   . Feeling of Stress :   Social Connections:   . Frequency of Communication with Friends and Family:   . Frequency of Social Gatherings with Friends and Family:   . Attends Religious Services:   . Active Member of Clubs or Organizations:   . Attends Archivist Meetings:   Marland Kitchen Marital Status:    Family History  Problem Relation Age of Onset  . Cancer Brother        lung cancer  . Cancer Sister        not sure  . Breast cancer Sister   . Heart disease Father   . Hypertension Father    Scheduled Meds: . enoxaparin (LOVENOX) injection  40 mg Subcutaneous Q24H   Continuous Infusions: . dextrose 5 % and 0.9% NaCl Stopped (02/17/20 0847)  . vancomycin     PRN Meds:.acetaminophen **OR** acetaminophen, ondansetron **OR** ondansetron (ZOFRAN)  IV Medications Prior to Admission:  Prior to Admission medications   Medication Sig Start Date End Date Taking? Authorizing Provider  furosemide (LASIX) 20 MG tablet Take 20 mg by mouth daily. 02/08/20  Yes [provider]  sertraline (ZOLOFT) 50 MG tablet Take 50 mg by mouth daily. 02/09/20  Yes [provider]  calcium-vitamin D (OSCAL WITH D)  500-200 MG-UNIT tablet Take 1 tablet by mouth 2 (two) times daily. Patient not taking: Reported on 01/20/2020 07/01/19   [name], MD  celecoxib (CELEBREX) 200 MG capsule Take 1 capsule (200 mg total) by mouth 2 (two) times daily. Patient not taking: Reported on 01/11/2020 07/01/19   [name], MD  donepezil (ARICEPT) 5 MG tablet Take 5 mg by mouth daily. 02/07/20   [provider]  lidocaine (LIDODERM) 5 % Place 1 patch onto the skin every 12 (twelve) hours. Remove & Discard patch within 12 hours or as directed by MD Patient not taking: Reported on 01/11/2020 12/06/19 12/05/20  Blake Divine, MD  metoprolol tartrate (LOPRESSOR) 25 MG tablet Take 0.5 tablets (12.5 mg total) by mouth 2 (two) times daily. Patient not taking: Reported on 01/11/2020 07/01/19   [name], MD  Multiple Vitamin (MULTI-VITAMINS) TABS Take by mouth.    [provider]   Allergies  Allergen Reactions  . Flu Virus Vaccine     Other reaction(s): Other (See Comments) Stomach cramps  . Latex Swelling  . Other     Other reaction(s): Unknown Allergy to eggs  . Procaine Other (See Comments)    weakness  . Soy Isoflavones     Other reaction(s): Other (See Comments) Soy causes legs to feel like rubber  . Penicillins Rash    Has patient had a PCN reaction causing immediate rash, facial/tongue/throat swelling, SOB or lightheadedness with hypotension: No Has patient had a PCN reaction causing severe rash involving mucus membranes or skin necrosis: No Has patient had a PCN reaction that required hospitalization: No Has patient had a PCN  reaction occurring within the last 10 years: No If all of the above answers are "NO", then may proceed with Cephalosporin use.    Review of Systems  Unable to perform ROS: Dementia    Physical Exam Vitals and nursing note reviewed.     Vital Signs: BP 123/71   Pulse 63   Temp 97.6 F (36.4 C) (Oral)   Resp 16   Ht 5\' 4"  (1.626 m)   Wt 59 kg   SpO2 97%   BMI 22.31 kg/m      Pain Score: 0-No pain   SpO2: SpO2: 97 % O2 Device:SpO2: 97 % O2 Flow Rate: .   IO: Intake/output summary:   Intake/Output Summary (Last 24 hours) at 02/17/2020 1506 Last data filed at 02/17/2020 0847 Gross per 24 hour  Intake 500 ml  Output --  Net 500 ml    LBM:   Baseline Weight: Weight: 59 kg Most recent weight: Weight: 59 kg     Palliative Assessment/Data:   Flowsheet Rows     Most Recent Value  Intake Tab  Referral Department  Hospitalist  Unit at Time of Referral  ER  Palliative Care Primary Diagnosis  Sepsis/Infectious Disease  Date Notified  02/17/20  Palliative Care Type  New Palliative care  Reason for referral  Clarify Goals of Care  Date of Admission  02/16/20  Date first seen by Palliative Care  02/17/20  # of days Palliative referral response time  0 Day(s)  # of days IP prior to Palliative referral  1  Clinical Assessment  Palliative Performance Scale Score  20%  Pain Max last 24 hours  Not able to report  Pain Min Last 24 hours  Not able to report  Dyspnea Max Last 24 Hours  Not able to report  Dyspnea Min Last 24 hours  Not  able to report  Psychosocial & Spiritual Assessment  Palliative Care Outcomes      Time In: 1510 Time Out: 1600 Time Total: 50 minutes Greater than 50%  of this time was spent counseling and coordinating care related to the above assessment and plan.  Signed by: Drue Novel, NP   Please contact Palliative Medicine Team phone at [phone] for questions and concerns.  For individual provider: See Shea Evans

## 2020-02-17 NOTE — ED Notes (Signed)
Pt still sleeping in bed at this time. Husband is back at bed at this time.

## 2020-02-17 NOTE — ED Notes (Signed)
Pt and husband sitting together at bedside. Pt has memory problem and is unable to verbalize adequate information for herself but has a pleasant demeanor

## 2020-02-17 NOTE — Progress Notes (Signed)
I've updated daughter Marcie Bal @ [phone]

## 2020-02-17 NOTE — ED Notes (Signed)
Pt ate most of her breakfast with assistance of her husband. Pt's husband given peanut butter and crackers to hold him over until cafeteria open for lunch.

## 2020-02-17 NOTE — ED Notes (Signed)
Pt provided w/ dinner tray and sprite.

## 2020-02-17 NOTE — ED Notes (Signed)
Pt incontinent and soiled with urine. Pt was changed and placed into a gown and clean linen/brief. Pt was given 2 warm blankets

## 2020-02-17 NOTE — ED Provider Notes (Signed)
[facility] Emergency Department Provider Note  ____________________________________________  Time seen: Approximately 12:28 AM  I have reviewed the triage vital signs and the nursing notes.   HISTORY  Chief Complaint Positive Blood Cultures  Level 5 caveat:  Portions of the history and physical were unable to be obtained due to dementia   HPI Cynthia Walker is a 86 y.o. female with a history of breast cancer status post lumpectomy, radiation and chemo in 2009, dementia, hypertension, osteoporosis who presents from home for positive blood cultures.  Patient was sent to the emergency department yesterday and seen by me after having a fever at home.  Patient never had a fever per EMS or here in the emergency room.  She had a pretty unremarkable work-up and was discharged home by Dr. Joan Mayans.  She was called back due to positive blood cultures growing MRSA and strep.  History is now gathered from patient and her husband who is at bedside.  According to the husband, patient has been at baseline since gone home.  She has had no more fevers, no cough, no shortness of breath, no vomiting or diarrhea.  Patient denies any headache, chest pain or abdominal pain.   Past Medical History:  Diagnosis Date  . Breast cancer [facility]) 2009   Right breast CA with lumpectomy, radiation and chemo tx's.  . Dementia ([place])   . Hypertension   . Personal history of chemotherapy   . Personal history of radiation therapy     Patient Active Problem List   Diagnosis Date Noted  . Breast pain, right 01/11/2020  . Femoral neck fracture ([place]) 06/27/2019  . Femur fracture, left ([place]) 06/26/2019  . Right lower lobe pulmonary nodule 05/04/2019  . Syncope 09/27/2017  . Atypical chest pain 09/03/2017  . Essential hypertension 09/03/2017  . Short-term memory loss 09/03/2017  . Osteoporosis 04/02/2016  . Breast cancer, right ([place]) 04/06/2008    Past Surgical History:  Procedure  Laterality Date  . BREAST BIOPSY Right 03/15/2008   invasive mammary carcinoma  . BREAST LUMPECTOMY Right 04/14/2008   Pathology revealed a 1.1 cm grade I invasive ductal carcinoma. Margins were negative.   . COLONOSCOPY WITH PROPOFOL N/A 07/10/2015   Procedure: COLONOSCOPY WITH PROPOFOL;  Surgeon: Hulen Luster, MD;  Location: [facility] ENDOSCOPY;  Service: Gastroenterology;  Laterality: N/A;  . INTRAMEDULLARY (IM) NAIL INTERTROCHANTERIC Left 06/27/2019   Procedure: INTRAMEDULLARY (IM) NAIL INTERTROCHANTRIC;  Surgeon: Creig Hines, MD;  Location: [facility] ORS;  Service: Orthopedics;  Laterality: Left;  . TONSILLECTOMY     age of 85 years old    Prior to Admission medications   Medication Sig Start Date End Date Taking? Authorizing Provider  calcium-vitamin D (OSCAL WITH D) 500-200 MG-UNIT tablet Take 1 tablet by mouth 2 (two) times daily. Patient not taking: Reported on 01/20/2020 07/01/19   [name], MD  celecoxib (CELEBREX) 200 MG capsule Take 1 capsule (200 mg total) by mouth 2 (two) times daily. Patient not taking: Reported on 01/11/2020 07/01/19   [name], MD  lidocaine (LIDODERM) 5 % Place 1 patch onto the skin every 12 (twelve) hours. Remove & Discard patch within 12 hours or as directed by MD Patient not taking: Reported on 01/11/2020 12/06/19 12/05/20  Blake Divine, MD  metoprolol tartrate (LOPRESSOR) 25 MG tablet Take 0.5 tablets (12.5 mg total) by mouth 2 (two) times daily. Patient not taking: Reported on 01/11/2020 07/01/19   [name], MD  Multiple Vitamin (MULTI-VITAMINS) TABS  Take by mouth.    [provider]    Allergies Flu virus vaccine, Latex, Other, Procaine, Soy isoflavones, and Penicillins  Family History  Problem Relation Age of Onset  . Cancer Brother        lung cancer  . Cancer Sister        not sure  . Breast cancer Sister   . Heart disease Father   . Hypertension Father     Social History Social History   Tobacco Use  . Smoking  status: Never Smoker  . Smokeless tobacco: Never Used  Substance Use Topics  . Alcohol use: No  . Drug use: No    Review of Systems  Constitutional: Negative for fever. Eyes: Negative for visual changes. ENT: Negative for sore throat. Neck: No neck pain  Cardiovascular: Negative for chest pain. Respiratory: Negative for shortness of breath. Gastrointestinal: Negative for abdominal pain, vomiting or diarrhea. Genitourinary: Negative for dysuria. Musculoskeletal: Negative for back pain. Skin: Negative for rash. Neurological: Negative for headaches, weakness or numbness. Psych: No SI or HI  ____________________________________________   PHYSICAL EXAM:  VITAL SIGNS: ED Triage Vitals [02/16/20 2323]  Enc Vitals Group     BP (!) 140/91     Pulse Rate 70     Resp 15     Temp 97.6 F (36.4 C)     Temp Source Oral     SpO2 99 %     Weight 130 lb (59 kg)     Height 5\' 4"  (1.626 m)     Head Circumference      Peak Flow      Pain Score      Pain Loc      Pain Edu?      Excl. in [place]?     Constitutional: Alert and oriented to self only (baseline). Well appearing and in no apparent distress. HEENT:      Head: Normocephalic and atraumatic.         Eyes: Conjunctivae are normal. Sclera is non-icteric.       Mouth/Throat: Mucous membranes are moist.       Neck: Supple with no signs of meningismus. Cardiovascular: Regular rate and rhythm. No murmurs, gallops, or rubs. 2+ symmetrical distal pulses are present in all extremities. No JVD. Respiratory: Normal respiratory effort. Lungs are clear to auscultation bilaterally. No wheezes, crackles, or rhonchi.  Gastrointestinal: Soft, non tender, and non distended with positive bowel sounds. No rebound or guarding. Musculoskeletal:  No edema, cyanosis, or erythema of extremities. Neurologic: Normal speech and language. Face is symmetric. Moving all extremities. No gross focal neurologic deficits are appreciated. Skin: Skin is warm, dry  and intact. No rash noted. Psychiatric: Mood and affect are normal. Speech and behavior are normal.  ____________________________________________   LABS (all labs ordered are listed, but only abnormal results are displayed)  Labs Reviewed  CBC WITH DIFFERENTIAL/PLATELET - Abnormal; Notable for the following components:      Result Value   RBC 3.55 (*)    Hemoglobin 11.0 (*)    HCT 33.8 (*)    All other components within normal limits  BASIC METABOLIC PANEL - Abnormal; Notable for the following components:   Glucose, Bld 110 (*)    Calcium 8.3 (*)    All other components within normal limits  CULTURE, BLOOD (ROUTINE X 2)  CULTURE, BLOOD (ROUTINE X 2)  SARS CORONAVIRUS 2 (TAT 6-24 HRS)  LACTIC ACID, PLASMA   ____________________________________________  EKG  none  ____________________________________________  RADIOLOGY  none  ____________________________________________   PROCEDURES  Procedure(s) performed:yes .1-3 Lead EKG Interpretation Performed by: Rudene Re, MD Authorized by: Rudene Re, MD     Interpretation: normal     ECG rate assessment: normal     Rhythm: sinus rhythm     Ectopy: none     Conduction: normal     Critical Care performed:  Yes  CRITICAL CARE Performed by: Rudene Re  ?  Total critical care time: 35 min  Critical care time was exclusive of separately billable procedures and treating other patients.  Critical care was necessary to treat or prevent imminent or life-threatening deterioration.  Critical care was time spent personally by me on the following activities: development of treatment plan with patient and/or surrogate as well as nursing, discussions with consultants, evaluation of patient's response to treatment, examination of patient, obtaining history from patient or surrogate, ordering and performing treatments and interventions, ordering and review of laboratory studies, ordering and review of  radiographic studies, pulse oximetry and re-evaluation of patient's condition.  ____________________________________________   INITIAL IMPRESSION / ASSESSMENT AND PLAN / ED COURSE   85 y.o. female with a history of breast cancer status post lumpectomy, radiation and chemo in 2009, dementia, hypertension, osteoporosis who presents from home for positive blood cultures done on ER visit yesterday when patient was sent to the ED for fever.  Old medical record review.  History gathered from patient and her husband who was at bedside.  Patient has been afebrile and has no complaints, no infectious signs or symptoms since being discharged home.  Review of epic show the patient had MRSA grown in both anaerobic bottles and Strep in one of anaerobic bottles. Will repeat blood cultures, repeat labs, cover for bacteremia and admit for close monitoring. No signs of sepsis at this time. Exam is non focal.   _________________________ 2:02 AM on 02/17/2020 -----------------------------------------  Repeat labs with no acute abnormalities.  Blood cultures are pending.  Patient received cefepime and vancomycin.  Discussed with Dr. Damita Dunnings for admission for possible bacteremia.     _____________________________________________ Please note:  Patient was evaluated in Emergency Department today for the symptoms described in the history of present illness. Patient was evaluated in the context of the global COVID-19 pandemic, which necessitated consideration that the patient might be at risk for infection with the SARS-CoV-2 virus that causes COVID-19. Institutional protocols and algorithms that pertain to the evaluation of patients at risk for COVID-19 are in a state of rapid change based on information released by regulatory bodies including the CDC and federal and state organizations. These policies and algorithms were followed during the patient's care in the ED.  Some ED evaluations and interventions may be delayed as  a result of limited staffing during the pandemic.   [place] Controlled Substance Database was reviewed by me. ____________________________________________   FINAL CLINICAL IMPRESSION(S) / ED DIAGNOSES   Final diagnoses:  Bacteremia      NEW MEDICATIONS STARTED DURING THIS VISIT:  ED Discharge Orders    None       Note:  This document was prepared using Dragon voice recognition software and may include unintentional dictation errors.    Alfred Levins, [place], MD 02/17/20 [phone]

## 2020-02-17 NOTE — Progress Notes (Signed)
Same day rounding progress note  Patient seen and examined in the ED. husband at bedside very sleepy providing most of the information as patient is confused which is her baseline, per husband  Streptococcal bacteremia -Patient presented initially to the emergency room on 02/16/2020 with confusion above baseline and recorded temperature at home of 101.2 but with negative work-up in the emergency room -Blood cultures growing methicillin-resistant gram-positive cocci in both aerobic and anaerobic bottles as well as streptococcal species -Continue IV vancomycin -Repeat blood cultures on 5/13 are negative thus far -No focalizing signs of infection -Covid test negative    Essential hypertension -Stable without any medications  Dementia without behavioral disturbance -Not currently on medication.  Followed by neurology    History of breast cancer status post lumpectomy, radiation and chemotherapy -Patient does not have a port -No acute concerns   Overall poor prognosis  Per husband they both live alone with son and daughter live nearby one in [place] and one at [facility].  Per husband patient has been declining for last few years and has not been ambulating. Will consult palliative care for goals of care discussion  DVT prophylaxis: Lovenox  Code Status: full code  Family Communication: Husband at bedside  disposition Plan: Back to previous home environment, may be with palliative care/hospice Consults called:  Palliative care, infectious disease  Status: Inpatient  Time spent: 20 minutes

## 2020-02-17 NOTE — ED Notes (Signed)
Pt ate 50% of dinner tray

## 2020-02-17 NOTE — ED Notes (Signed)
Family updated as to patient's status. Provided pt's dad w/ phone to talk to daughter Marcie Bal.

## 2020-02-17 NOTE — ED Notes (Signed)
Called dietary to send food tray for pt.

## 2020-02-18 DIAGNOSIS — Z7189 Other specified counseling: Secondary | ICD-10-CM

## 2020-02-18 LAB — BASIC METABOLIC PANEL WITH GFR
Anion gap: 8 (ref 5–15)
BUN: 7 mg/dL — ABNORMAL LOW (ref 8–23)
CO2: 26 mmol/L (ref 22–32)
Calcium: 8.1 mg/dL — ABNORMAL LOW (ref 8.9–10.3)
Chloride: 103 mmol/L (ref 98–111)
Creatinine, Ser: 0.49 mg/dL (ref 0.44–1.00)
GFR calc Af Amer: >60 mL/min
GFR calc non Af Amer: >60 mL/min
Glucose, Bld: 109 mg/dL — ABNORMAL HIGH (ref 70–99)
Potassium: 3.7 mmol/L (ref 3.5–5.1)
Sodium: 137 mmol/L (ref 135–145)

## 2020-02-18 MED ORDER — FUROSEMIDE 20 MG PO TABS
20.0000 mg | ORAL_TABLET | Freq: Every day | ORAL | Status: DC
  Administered 2020-02-18 – 2020-02-20 (×3): 20 mg via ORAL
  Filled 2020-02-18 (×3): qty 1

## 2020-02-18 NOTE — Progress Notes (Addendum)
[facility] [place] NAME: Cynthia Walker    MR#:  [id number]  DATE OF BIRTH:  Oct 18, 1934  SUBJECTIVE:  CHIEF COMPLAINT:   Chief Complaint  Patient presents with  . Positive Blood Cultures  No complaints.  Husband at bedside REVIEW OF SYSTEMS:  Review of Systems  Constitutional: Negative for diaphoresis, fever, malaise/fatigue and weight loss.  HENT: Negative for ear discharge, ear pain, hearing loss, nosebleeds, sore throat and tinnitus.   Eyes: Negative for blurred vision and pain.  Respiratory: Negative for cough, hemoptysis, shortness of breath and wheezing.   Cardiovascular: Negative for chest pain, palpitations, orthopnea and leg swelling.  Gastrointestinal: Negative for abdominal pain, blood in stool, constipation, diarrhea, heartburn, nausea and vomiting.  Genitourinary: Negative for dysuria, frequency and urgency.  Musculoskeletal: Negative for back pain and myalgias.  Skin: Negative for itching and rash.  Neurological: Negative for dizziness, tingling, tremors, focal weakness, seizures, weakness and headaches.  Psychiatric/Behavioral: Negative for depression. The patient is not nervous/anxious.    DRUG ALLERGIES:   Allergies  Allergen Reactions  . Flu Virus Vaccine     Other reaction(s): Other (See Comments) Stomach cramps  . Latex Swelling  . Other     Other reaction(s): Unknown Allergy to eggs  . Procaine Other (See Comments)    weakness  . Soy Isoflavones     Other reaction(s): Other (See Comments) Soy causes legs to feel like rubber  . Penicillins Rash    Has patient had a PCN reaction causing immediate rash, facial/tongue/throat swelling, SOB or lightheadedness with hypotension: No Has patient had a PCN reaction causing severe rash involving mucus membranes or skin necrosis: No Has patient had a PCN reaction that required hospitalization: No Has patient had a PCN reaction occurring within the last 10 years: No If all of the  above answers are "NO", then may proceed with Cephalosporin use.    VITALS:  Blood pressure 119/60, pulse 76, temperature 98.4 F (36.9 C), temperature source Oral, resp. rate 18, height 5\' 4"  (1.626 m), weight 59 kg, SpO2 97 %. PHYSICAL EXAMINATION:  Physical Exam HENT:     Head: Normocephalic and atraumatic.  Eyes:     Conjunctiva/sclera: Conjunctivae normal.     Pupils: Pupils are equal, round, and reactive to light.  Neck:     Thyroid: No thyromegaly.     Trachea: No tracheal deviation.  Cardiovascular:     Rate and Rhythm: Normal rate and regular rhythm.     Heart sounds: Normal heart sounds.  Pulmonary:     Effort: Pulmonary effort is normal. No respiratory distress.     Breath sounds: Normal breath sounds. No wheezing.  Chest:     Chest wall: No tenderness.  Abdominal:     General: Bowel sounds are normal. There is no distension.     Palpations: Abdomen is soft.     Tenderness: There is no abdominal tenderness.  Musculoskeletal:        General: Normal range of motion.     Cervical back: Normal range of motion and neck supple.  Skin:    General: Skin is warm and dry.     Findings: No rash.  Neurological:     Mental Status: She is alert and oriented to person, place, and time.     Cranial Nerves: No cranial nerve deficit.    LABORATORY PANEL:  Female CBC Recent Labs  Lab 02/17/20 0032  WBC 7.6  HGB 11.0*  HCT 33.8*  PLT  325   ------------------------------------------------------------------------------------------------------------------ Chemistries  Recent Labs  Lab 02/16/20 0625 02/17/20 0032 02/18/20 0403  NA 136   < > 137  K 3.9   < > 3.7  CL 104   < > 103  CO2 26   < > 26  GLUCOSE 99   < > 109*  BUN 14   < > 7*  CREATININE 0.57   < > 0.49  CALCIUM 7.8*   < > 8.1*  AST 20  --   --   ALT 9  --   --   ALKPHOS 67  --   --   BILITOT 1.0  --   --    < > = values in this interval not displayed.   RADIOLOGY:  No results found. ASSESSMENT AND  PLAN:  Cynthia Walker is a 85 y.o. female with medical history significant for hypertension, early dementia and history of breast cancer status post right lumpectomy, radiation and chemo in 2009, osteoporosis admitted for + blood c/s  Streptococcal bacteremia -Patient presented initially to the emergency room on 02/16/2020 with confusion above baseline and recorded temperature at home of 101.2 but with negative work-up in the emergency room -Blood cultures growing ram-positive cocci/coag neg staph as well as streptococcal species -Continue IV vancomycin -Repeat blood cultures on 5/13 are negative thus far -No focalizing signs of infection, skin could be source -Covid test negative  Essential hypertension -Stable without any medications  Dementia without behavioral disturbance -Not currently on medication. Followed by neurology  History of breast cancerstatus post lumpectomy, radiation and chemotherapy -Patient does not have a port -No acute concerns  Outpt palliative care   Status is: Inpatient  Remains inpatient appropriate because:IV treatments appropriate due to intensity of illness or inability to take PO   Dispo: The patient is from: Home              Anticipated d/c is to: Home              Anticipated d/c date is: 1 day              Patient currently is not medically stable to d/c.     DVT prophylaxis: Lovenox Family Communication: Discussed with husband at bedside and with Daughter over phone   All the records are reviewed and case discussed with Care Management/Social Worker. Management plans discussed with the patient, family and they are in agreement.  CODE STATUS: DNR  TOTAL TIME TAKING CARE OF THIS PATIENT: 35 minutes.   More than 50% of the time was spent in counseling/coordination of care: YES  POSSIBLE D/C IN 1-2 DAYS, DEPENDING ON CLINICAL CONDITION.   Max Sane M.D on 02/18/2020 at 6:25 PM  [facility]   CC: Primary care  physician; Sofie Hartigan, MD  Note: This dictation was prepared with Dragon dictation along with smaller phrase technology. Any transcriptional errors that result from this process are unintentional.

## 2020-02-18 NOTE — Progress Notes (Signed)
ID Pt more alert and awake Responding to questions Husband at bed side. Says she is feeling better Ate lunch  Patient Vitals for the past 24 hrs:  BP Temp Temp src Pulse Resp SpO2  02/18/20 1152 119/60 -- -- 76 -- 97 %  02/18/20 0454 (!) 159/99 -- -- 89 18 100 %  02/17/20 2151 133/70 98.4 F (36.9 C) Oral 82 18 97 %  02/17/20 2115 (!) 147/88 -- -- 79 17 98 %    O/E awake and alert Some non purposeful movt of rt hand- picking on bed sheets  Chest b/l air entry HS [id number] Chest wall mild erythema has resolved abd soft Left hip- scratch seen   CNS -non focal   CBC Latest Ref Rng & Units 02/17/2020 02/16/2020 01/07/2020  WBC 4.0 - 10.5 K/uL 7.6 6.7 9.7  Hemoglobin 12.0 - 15.0 g/dL 11.0(L) 10.7(L) 13.6  Hematocrit 36.0 - 46.0 % 33.8(L) 31.8(L) 41.1  Platelets 150 - 400 K/uL 325 305 393    CMP Latest Ref Rng & Units 02/18/2020 02/17/2020 02/16/2020  Glucose 70 - 99 mg/dL 109(H) 110(H) 99  BUN 8 - 23 mg/dL 7(L) 9 14  Creatinine 0.44 - 1.00 mg/dL 0.49 0.53 0.57  Sodium 135 - 145 mmol/L 137 136 136  Potassium 3.5 - 5.1 mmol/L 3.7 3.5 3.9  Chloride 98 - 111 mmol/L 103 101 104  CO2 22 - 32 mmol/L 26 26 26   Calcium 8.9 - 10.3 mg/dL 8.1(L) 8.3(L) 7.8(L)  Total Protein 6.5 - 8.1 g/dL - - 5.8(L)  Total Bilirubin 0.3 - 1.2 mg/dL - - 1.0  Alkaline Phos 38 - 126 U/L - - 67  AST 15 - 41 U/L - - 20  ALT 0 - 44 U/L - - 9   Micro BC 5/12 staph epidermidis both sets Also strep salivarius, strep sanguis and staph hominis 5/13 BC NG   Impression/Recommendation  Fever- resolved Unclear etiology ? Skin  Multiple organisms in the blood culture -from rt forearm 4 different organisms -- this is  contaminantion rather than true pathogen  Staph epi in both sets rt forearm and left wrist  IF the MIC is sme for both then it could be a pathogen VS still a contaminant  harware on left femur- site healed well and there is no swelling except for  A superficial scratch which is not infected    Currently on vanco If repeat culture negative and if afebrile we may be able to stop antibiotics after the weekend  Discussed the management with the husband

## 2020-02-18 NOTE — Progress Notes (Signed)
Initially patient's husband wanted her wedding ring to be cut off of her finger, as he was unable to remove it.  When nursing came in to remove the ring the patient's husband declined and said he would decide tomorrow if he wants the ring to be removed.

## 2020-02-18 NOTE — Progress Notes (Signed)
Palliative: Cynthia Walker is resting quietly in bed.  She will make eye contact and smile.  She is alert, oriented to self only.  Spouse of 65 years, Cynthia Walker, is at bedside.  We talk about the treatment plan.   We talk in detail about the chronic illness pathway, what is normal and expected.  We talk in detail about the chronic illness pathway related to dementia.  In particular related to mental/verbal changes, functional status changes and nutritional changes.  Mr. Simi states that he has seen some of these changes in his wife.  We talked in detail about risk for poor by mouth intake, in particular increased risk for dehydration.  We talked about outpatient palliative and hospice services.  We talked about the benefits of each.  Mr. Chauncey tells me that he needs help at home with bathing Mrs. Argo.  I share that palliative will not provide at home services, home health would likely start the services but, they would at some point end.  I share that hospice services would be ongoing.  I encouraged him to accept palliative services, get connected, and remain open to any services that would help their needs.  He agrees to outpatient palliative services to start.  We talked about how to make choices for loved ones.  I shared that just like I will get sick again, so we will Mrs. Dubose.  We talked about are we doing something for her or to her.  I also asked Mr. Gaumond if the person his wife was 10 years ago or sitting here with [place] having this conversation, how would that Mrs. Borah tell us to care for her now.  At this point he becomes tearful, and nods affirmatively.  We again talked about Mrs. Gilmartin's rings.  It seems that she has worsening swelling from when I saw her yesterday afternoon.  Mr. Leisey states that he agrees that swelling is worsening, and is agreeable at this point to mechanical ring removal.  Nursing staff updated.  Conference with attending, bedside nursing staff, transition  of care team, hospice liaison related to patient condition, needs, goals of care.  Plan:   Continue to treat the treatable.  No CPR or intubation.  Agreeable to out patient palliative.    43 minutes Quinn Axe, NP Palliative Medicine Team Team Phone # [phone] Greater than 50% of this time was spent counseling and coordinating care related to the above assessment and plan.

## 2020-02-18 NOTE — Progress Notes (Signed)
Pharmacy Antibiotic Note  Cynthia Walker is a 85 y.o. female admitted on 02/16/2020 with bacteremia.  Pharmacy has been consulted for Vancomycin dosing.  Plan: Vancomycin 1000 mg IV Q 24 hrs. Goal AUC 400-550. Expected AUC: 544.2, Css min 13.5 SCr used: 0.8   Height: 5\' 4"  (162.6 cm) Weight: 59 kg (130 lb) IBW/kg (Calculated) : 54.7  Temp (24hrs), Avg:98.4 F (36.9 C), Min:98.4 F (36.9 C), Max:98.4 F (36.9 C)  Recent Labs  Lab 02/16/20 0625 02/16/20 0644 02/17/20 0032 02/18/20 0403  WBC 6.7  --  7.6  --   CREATININE 0.57  --  0.53 0.49  LATICACIDVEN  --  1.1 1.6  --     Estimated Creatinine Clearance: 46.8 mL/min (by C-G formula based on SCr of 0.49 mg/dL).    Allergies  Allergen Reactions  . Flu Virus Vaccine     Other reaction(s): Other (See Comments) Stomach cramps  . Latex Swelling  . Other     Other reaction(s): Unknown Allergy to eggs  . Procaine Other (See Comments)    weakness  . Soy Isoflavones     Other reaction(s): Other (See Comments) Soy causes legs to feel like rubber  . Penicillins Rash    Has patient had a PCN reaction causing immediate rash, facial/tongue/throat swelling, SOB or lightheadedness with hypotension: No Has patient had a PCN reaction causing severe rash involving mucus membranes or skin necrosis: No Has patient had a PCN reaction that required hospitalization: No Has patient had a PCN reaction occurring within the last 10 years: No If all of the above answers are "NO", then may proceed with Cephalosporin use.     Antimicrobials this admission:  Vancomycin 5/13 >> Cefepime 5/13 x 1  Dose adjustments this admission:   Microbiology results: 5/13 BCx: NG x 1 day 5/12 Bcx: CoNS, Strep (see ID note)  UCx:    Sputum:    MRSA PCR:   Thank you for allowing pharmacy to be a part of this patient's care.  Damiel Barthold A 02/18/2020 11:24 AM

## 2020-02-18 NOTE — Progress Notes (Signed)
[facility]) Hospital Liaison RN note  Patient has been referred for community based palliative care with Manufacturing engineer.   Will follow for disposition.  Please call with any questions or concerns.  Thank you. Margaretmary Eddy, BSN, RN [facility] Liaison [phone]

## 2020-02-18 NOTE — TOC Initial Note (Signed)
Transition of Care [facility]) - Initial/Assessment Note    Patient Details  Name: Cynthia Walker MRN: [id number] Date of Birth: [birth year]  [facility]) CM/SW Contact:    Beverly Sessions, RN Phone Number: 02/18/2020, 2:38 PM  Clinical Narrative:                 Patient admitted from home with sepsis  Patient with history of dementia.   Husband at bedside Patient lives at home with husband.  He provides 24 hour care  PCP [place] - denies issues obtaining medications  Husband states that she has a gait belt, WC, RW, and WC van at home to take her to appointments  Husband is not interested in home health services.  He states that the patient gets the therapy that she needs at outpatient therapy, and wishes for her to continue.  Her next appointment is Tuesday. MD updated. Husband agreeable to outpatient palliative serves.  Referral made to [facility] with [employer].  Husband to transport at discharge    Expected Discharge Plan: Home/Self Care Barriers to Discharge: Continued Medical Work up   Patient Goals and CMS Choice        Expected Discharge Plan and Services Expected Discharge Plan: Home/Self Care   Discharge Planning Services: CM Consult   Living arrangements for the past 2 months: Single Family Home                                      Prior Living Arrangements/Services Living arrangements for the past 2 months: Single Family Home Lives with:: Spouse   Do you feel safe going back to the place where you live?: Yes      Need for Family Participation in Patient Care: Yes (Comment) Care giver support system in place?: Yes (comment) Current home services: DME Criminal Activity/Legal Involvement Pertinent to Current Situation/Hospitalization: Yes - Comment as needed  Activities of Daily Living Home Assistive Devices/Equipment: Wheelchair ADL Screening (condition at time of admission) Patient's cognitive  ability adequate to safely complete daily activities?: No Is the patient deaf or have difficulty hearing?: No Does the patient have difficulty seeing, even when wearing glasses/contacts?: No Does the patient have difficulty concentrating, remembering, or making decisions?: Yes Patient able to express need for assistance with ADLs?: No Does the patient have difficulty dressing or bathing?: Yes Independently performs ADLs?: No Communication: Independent Dressing (OT): Needs assistance Is this a change from baseline?: Pre-admission baseline Grooming: Needs assistance Is this a change from baseline?: Pre-admission baseline Feeding: Needs assistance Is this a change from baseline?: Pre-admission baseline Bathing: Needs assistance Is this a change from baseline?: Pre-admission baseline Toileting: Needs assistance Is this a change from baseline?: Pre-admission baseline In/Out Bed: Dependent Is this a change from baseline?: Pre-admission baseline Walks in Home: Dependent Is this a change from baseline?: Pre-admission baseline Does the patient have difficulty walking or climbing stairs?: Yes Weakness of Legs: Both Weakness of Arms/Hands: None  Permission Sought/Granted                  Emotional Assessment       Orientation: : Oriented to Self      Admission diagnosis:  Bacteremia [R78.81] MRSA bacteremia [R78.81, B95.62] Patient Active Problem List   Diagnosis Date Noted  . Encounter for hospice care discussion   . MRSA bacteremia 02/17/2020  . Streptococcal bacteremia 02/17/2020  .  Bacteremia 02/17/2020  . History of breast cancer 02/17/2020  . Goals of care, counseling/discussion   . Palliative care by specialist   . DNR (do not resuscitate) discussion   . Breast pain, right 01/11/2020  . Femoral neck fracture ([place]) 06/27/2019  . Femur fracture, left ([place]) 06/26/2019  . Right lower lobe pulmonary nodule 05/04/2019  . Syncope 09/27/2017  . Atypical chest pain  09/03/2017  . Essential hypertension 09/03/2017  . Short-term memory loss 09/03/2017  . Osteoporosis 04/02/2016  . Breast cancer, right ([place]) 04/06/2008   PCP:  Sofie Hartigan, MD Pharmacy:   [id number] - [place], [place] [address] Phone: [phone] Fax: [place], [place] [facility] [address] AT [address] [place] [facility] [place] [address] Phone: [phone] Fax: [phone]     Social Determinants of Health (SDOH) Interventions    Readmission Risk Interventions No flowsheet data found.

## 2020-02-18 NOTE — Plan of Care (Signed)
Continuing with plan of care. 

## 2020-02-19 LAB — BASIC METABOLIC PANEL
Anion gap: 7 (ref 5–15)
BUN: <5 mg/dL — ABNORMAL LOW (ref 8–23)
CO2: 27 mmol/L (ref 22–32)
Calcium: 8.3 mg/dL — ABNORMAL LOW (ref 8.9–10.3)
Chloride: 103 mmol/L (ref 98–111)
Creatinine, Ser: 0.47 mg/dL (ref 0.44–1.00)
GFR calc Af Amer: >60 mL/min (ref >60)
GFR calc non Af Amer: >60 mL/min (ref >60)
Glucose, Bld: 127 mg/dL — ABNORMAL HIGH (ref 70–99)
Potassium: 3.3 mmol/L — ABNORMAL LOW (ref 3.5–5.1)
Sodium: 137 mmol/L (ref 135–145)

## 2020-02-19 LAB — CBC
HCT: 34.8 % — ABNORMAL LOW (ref 36.0–46.0)
Hemoglobin: 11.5 g/dL — ABNORMAL LOW (ref 12.0–15.0)
MCH: 30.8 pg (ref 26.0–34.0)
MCHC: 33 g/dL (ref 30.0–36.0)
MCV: 93.3 fL (ref 80.0–100.0)
Platelets: 350 10*3/uL (ref 150–400)
RBC: 3.73 MIL/uL — ABNORMAL LOW (ref 3.87–5.11)
RDW: 11.8 % (ref 11.5–15.5)
WBC: 7.7 10*3/uL (ref 4.0–10.5)
nRBC: 0 % (ref 0.0–0.2)

## 2020-02-19 LAB — MAGNESIUM: Magnesium: 1.8 mg/dL (ref 1.7–2.4)

## 2020-02-19 LAB — CULTURE, BLOOD (ROUTINE X 2)

## 2020-02-19 MED ORDER — POTASSIUM CHLORIDE CRYS ER 20 MEQ PO TBCR
40.0000 meq | EXTENDED_RELEASE_TABLET | Freq: Once | ORAL | Status: AC
  Administered 2020-02-19: 40 meq via ORAL
  Filled 2020-02-19: qty 2

## 2020-02-19 MED ORDER — CEFAZOLIN SODIUM-DEXTROSE 2-4 GM/100ML-% IV SOLN
2.0000 g | Freq: Three times a day (TID) | INTRAVENOUS | Status: DC
  Administered 2020-02-19 – 2020-02-20 (×3): 2 g via INTRAVENOUS
  Filled 2020-02-19 (×6): qty 100

## 2020-02-19 NOTE — Progress Notes (Signed)
[place] at [place] NAME: Cynthia Walker    MR#:  [id number]  DATE OF BIRTH:  [birth year]  SUBJECTIVE:  CHIEF COMPLAINT:   Chief Complaint  Patient presents with  . Positive Blood Cultures  Feeling better.  No fever.  Husband at bedside.  Requesting 1 more night stay to make sure she does not have to come back REVIEW OF SYSTEMS:  Review of Systems  Constitutional: Negative for diaphoresis, fever, malaise/fatigue and weight loss.  HENT: Negative for ear discharge, ear pain, hearing loss, nosebleeds, sore throat and tinnitus.   Eyes: Negative for blurred vision and pain.  Respiratory: Negative for cough, hemoptysis, shortness of breath and wheezing.   Cardiovascular: Negative for chest pain, palpitations, orthopnea and leg swelling.  Gastrointestinal: Negative for abdominal pain, blood in stool, constipation, diarrhea, heartburn, nausea and vomiting.  Genitourinary: Negative for dysuria, frequency and urgency.  Musculoskeletal: Negative for back pain and myalgias.  Skin: Negative for itching and rash.  Neurological: Negative for dizziness, tingling, tremors, focal weakness, seizures, weakness and headaches.  Psychiatric/Behavioral: Negative for depression. The patient is not nervous/anxious.    DRUG ALLERGIES:   Allergies  Allergen Reactions  . Flu Virus Vaccine     Other reaction(s): Other (See Comments) Stomach cramps  . Latex Swelling  . Other     Other reaction(s): Unknown Allergy to eggs  . Procaine Other (See Comments)    weakness  . Soy Isoflavones     Other reaction(s): Other (See Comments) Soy causes legs to feel like rubber  . Penicillins Rash    Has patient had a PCN reaction causing immediate rash, facial/tongue/throat swelling, SOB or lightheadedness with hypotension: No Has patient had a PCN reaction causing severe rash involving mucus membranes or skin necrosis: No Has patient had a PCN reaction that required hospitalization:  No Has patient had a PCN reaction occurring within the last 10 years: No If all of the above answers are "NO", then may proceed with Cephalosporin use.    VITALS:  Blood pressure (!) 152/82, pulse 72, temperature 98.1 F (36.7 C), temperature source Oral, resp. rate 20, height 5\' 4"  (1.626 m), weight 59 kg, SpO2 96 %. PHYSICAL EXAMINATION:  Physical Exam HENT:     Head: Normocephalic and atraumatic.  Eyes:     Conjunctiva/sclera: Conjunctivae normal.     Pupils: Pupils are equal, round, and reactive to light.  Neck:     Thyroid: No thyromegaly.     Trachea: No tracheal deviation.  Cardiovascular:     Rate and Rhythm: Normal rate and regular rhythm.     Heart sounds: Normal heart sounds.  Pulmonary:     Effort: Pulmonary effort is normal. No respiratory distress.     Breath sounds: Normal breath sounds. No wheezing.  Chest:     Chest wall: No tenderness.  Abdominal:     General: Bowel sounds are normal. There is no distension.     Palpations: Abdomen is soft.     Tenderness: There is no abdominal tenderness.  Musculoskeletal:        General: Normal range of motion.     Cervical back: Normal range of motion and neck supple.  Skin:    General: Skin is warm and dry.     Findings: No rash.  Neurological:     Mental Status: She is alert and oriented to person, place, and time.     Cranial Nerves: No cranial nerve deficit.    LABORATORY PANEL:  Female CBC Recent Labs  Lab 02/19/20 0554  WBC 7.7  HGB 11.5*  HCT 34.8*  PLT 350   ------------------------------------------------------------------------------------------------------------------ Chemistries  Recent Labs  Lab 02/16/20 0625 02/17/20 0032 02/19/20 0554  NA 136   < > 137  K 3.9   < > 3.3*  CL 104   < > 103  CO2 26   < > 27  GLUCOSE 99   < > 127*  BUN 14   < > <5*  CREATININE 0.57   < > 0.47  CALCIUM 7.8*   < > 8.3*  AST 20  --   --   ALT 9  --   --   ALKPHOS 67  --   --   BILITOT 1.0  --   --    <  > = values in this interval not displayed.   RADIOLOGY:  No results found. ASSESSMENT AND PLAN:  Cynthia Walker is a 85 y.o. female with medical history significant for hypertension, early dementia and history of breast cancer status post right lumpectomy, radiation and chemo in 2009, osteoporosis admitted for + blood c/s  Streptococcal bacteremia -Patient presented initially to the emergency room on 02/16/2020 with confusion above baseline and recorded temperature at home of 101.2 but with negative work-up in the emergency room -Blood cultures growing ram-positive cocci/coag neg staph as well as streptococcal species -Switch her IV Ancef from vancomycin based on culture sensitivity -Repeat blood cultures on 5/13 are negative thus far -No focalizing signs of infection, skin could be source -Covid test negative  Essential hypertension -Stable without any medications  Dementia without behavioral disturbance -Not currently on medication. Followed by neurology  History of breast cancerstatus post lumpectomy, radiation and chemotherapy -Patient does not have a port -No acute concerns  Outpt palliative care   Status is: Inpatient  Remains inpatient appropriate because:IV treatments appropriate due to intensity of illness or inability to take PO   Dispo: The patient is from: Home              Anticipated d/c is to: Home              Anticipated d/c date is: 1 day              Patient currently is not medically stable to d/c.  Patient requesting 1 more night stay as she does not feel comfortable going home yet     DVT prophylaxis: Lovenox Family Communication: Discussed with husband at bedside   All the records are reviewed and case discussed with Care Management/Social Worker. Management plans discussed with the patient, family and they are in agreement.  CODE STATUS: DNR  TOTAL TIME TAKING CARE OF THIS PATIENT: 35 minutes.   More than 50% of the time was  spent in counseling/coordination of care: YES  POSSIBLE D/C IN 1 DAYS, DEPENDING ON CLINICAL CONDITION.   Max Sane M.D on 02/19/2020 at 1:51 PM  [facility]   CC: Primary care physician; Sofie Hartigan, MD  Note: This dictation was prepared with Dragon dictation along with smaller phrase technology. Any transcriptional errors that result from this process are unintentional.

## 2020-02-19 NOTE — Progress Notes (Signed)
Patient in bed wide awake. Alert to self. Patient continues to have memory impairment/forgetfulness. Husband at the bedside. Husb worried about purewick not working properly. Reassessed purewick and is in place and is functioning with urine is present in canister. Patient complains of no pain or discomfort when asked but is was tearful for she thought her husband had left her. Reoriented patient and showed her that her husband is right next to her. Explained to patient that it is after midnight and that is she said she would get some rest. Will continue to monitor to end of shift.

## 2020-02-19 NOTE — Progress Notes (Signed)
Pharmacy Antibiotic Note  Cynthia Walker is a 85 y.o. female admitted on 02/16/2020 with bacteremia. 5/12 blood cultures growing multiple different organisms which likely indicates contamination; however, staph epi growing in both sets which could represent true pathogen. BCID identified methicillin resistant coag negative staph + streptococcus spp. Patient was empirically started on vancomycin. Staph epi from one set of blood cultures is oxacillin susceptible - second set susceptibilities pending. ID following and switching therapy to cefazolin.   Repeat 5/13 Bcx NG x 2 days. Patient is afebrile, no leukocytosis.  Plan: D/C vancomycin  Start cefazolin 2 g q8h  Continue to follow blood culture results   Height: 5\' 4"  (162.6 cm) Weight: 59 kg (130 lb) IBW/kg (Calculated) : 54.7  Temp (24hrs), Avg:98.3 F (36.8 C), Min:98.1 F (36.7 C), Max:98.5 F (36.9 C)  Recent Labs  Lab 02/16/20 0625 02/16/20 0644 02/17/20 0032 02/18/20 0403 02/19/20 0554  WBC 6.7  --  7.6  --  7.7  CREATININE 0.57  --  0.53 0.49 0.47  LATICACIDVEN  --  1.1 1.6  --   --     Estimated Creatinine Clearance: 46.8 mL/min (by C-G formula based on SCr of 0.47 mg/dL).    Allergies  Allergen Reactions  . Flu Virus Vaccine     Other reaction(s): Other (See Comments) Stomach cramps  . Latex Swelling  . Other     Other reaction(s): Unknown Allergy to eggs  . Procaine Other (See Comments)    weakness  . Soy Isoflavones     Other reaction(s): Other (See Comments) Soy causes legs to feel like rubber  . Penicillins Rash    Has patient had a PCN reaction causing immediate rash, facial/tongue/throat swelling, SOB or lightheadedness with hypotension: No Has patient had a PCN reaction causing severe rash involving mucus membranes or skin necrosis: No Has patient had a PCN reaction that required hospitalization: No Has patient had a PCN reaction occurring within the last 10 years: No If all of the above  answers are "NO", then may proceed with Cephalosporin use.     Antimicrobials this admission: Cefazolin 5/15 >> Vancomycin 5/13 >> 5/14 Cefepime 5/13 x 1  Dose adjustments this admission:   Microbiology results: 5/13 BCx: NG x 2 day 5/12 Bcx: Staph epi (both sets), multiple strep spp, staphylococcus hominis in one set (see ID note) 5/12 BCID: Methicillin resistance CoNS, strep spp  Thank you for allowing pharmacy to be a part of this patient's care.  [place] Resident 02/19/2020 12:34 PM

## 2020-02-19 NOTE — Plan of Care (Signed)
Continuing with plan of care. 

## 2020-02-20 LAB — CULTURE, BLOOD (ROUTINE X 2)

## 2020-02-20 MED ORDER — POTASSIUM CHLORIDE CRYS ER 20 MEQ PO TBCR
40.0000 meq | EXTENDED_RELEASE_TABLET | Freq: Once | ORAL | Status: DC
  Filled 2020-02-20: qty 2

## 2020-02-20 MED ORDER — FUROSEMIDE 10 MG/ML IJ SOLN
40.0000 mg | Freq: Once | INTRAMUSCULAR | Status: AC
  Administered 2020-02-20: 40 mg via INTRAVENOUS
  Filled 2020-02-20: qty 4

## 2020-02-20 MED ORDER — TRAZODONE HCL 50 MG PO TABS
50.0000 mg | ORAL_TABLET | Freq: Every evening | ORAL | Status: DC | PRN
  Administered 2020-02-20: 50 mg via ORAL
  Filled 2020-02-20: qty 1

## 2020-02-20 NOTE — Progress Notes (Addendum)
[place] at [place] NAME: Cynthia Walker    MR#:  [id number]  DATE OF BIRTH:  1934/10/09  SUBJECTIVE:  CHIEF COMPLAINT:   Chief Complaint  Patient presents with  . Positive Blood Cultures  Patient very sleepy, husband requesting not to discharge her like this as he does not feel comfortable taking her home.  Also requesting to get her ring removed if possible REVIEW OF SYSTEMS:  Review of Systems  Unable to perform ROS: Dementia  Patient is very sleepy DRUG ALLERGIES:   Allergies  Allergen Reactions  . Flu Virus Vaccine     Other reaction(s): Other (See Comments) Stomach cramps  . Latex Swelling  . Other     Other reaction(s): Unknown Allergy to eggs  . Procaine Other (See Comments)    weakness  . Soy Isoflavones     Other reaction(s): Other (See Comments) Soy causes legs to feel like rubber  . Penicillins Rash    Has patient had a PCN reaction causing immediate rash, facial/tongue/throat swelling, SOB or lightheadedness with hypotension: No Has patient had a PCN reaction causing severe rash involving mucus membranes or skin necrosis: No Has patient had a PCN reaction that required hospitalization: No Has patient had a PCN reaction occurring within the last 10 years: No If all of the above answers are "NO", then may proceed with Cephalosporin use.    VITALS:  Blood pressure (!) 171/89, pulse 76, temperature 97.8 F (36.6 C), temperature source Oral, resp. rate 16, height 5\' 4"  (1.626 m), weight 59 kg, SpO2 98 %. PHYSICAL EXAMINATION:  Physical Exam HENT:     Head: Normocephalic and atraumatic.  Eyes:     Conjunctiva/sclera: Conjunctivae normal.     Pupils: Pupils are equal, round, and reactive to light.  Neck:     Thyroid: No thyromegaly.     Trachea: No tracheal deviation.  Cardiovascular:     Rate and Rhythm: Normal rate and regular rhythm.     Heart sounds: Normal heart sounds.  Pulmonary:     Effort: Pulmonary effort is  normal. No respiratory distress.     Breath sounds: Normal breath sounds. No wheezing.  Chest:     Chest wall: No tenderness.  Abdominal:     General: Bowel sounds are normal. There is no distension.     Palpations: Abdomen is soft.     Tenderness: There is no abdominal tenderness.  Musculoskeletal:        General: Normal range of motion.     Cervical back: Normal range of motion and neck supple.  Skin:    General: Skin is warm and dry.     Findings: No rash.  Neurological:     Cranial Nerves: No cranial nerve deficit.     Comments: Sleepy    LABORATORY PANEL:  Female CBC Recent Labs  Lab 02/19/20 0554  WBC 7.7  HGB 11.5*  HCT 34.8*  PLT 350   ------------------------------------------------------------------------------------------------------------------ Chemistries  Recent Labs  Lab 02/16/20 0625 02/17/20 0032 02/19/20 0554  NA 136   < > 137  K 3.9   < > 3.3*  CL 104   < > 103  CO2 26   < > 27  GLUCOSE 99   < > 127*  BUN 14   < > <5*  CREATININE 0.57   < > 0.47  CALCIUM 7.8*   < > 8.3*  MG  --   --  1.8  AST 20  --   --  ALT 9  --   --   ALKPHOS 67  --   --   BILITOT 1.0  --   --    < > = values in this interval not displayed.   RADIOLOGY:  No results found. ASSESSMENT AND PLAN:  Cynthia Walker is a 85 y.o. female with medical history significant for hypertension, early dementia and history of breast cancer status post right lumpectomy, radiation and chemo in 2009, osteoporosis admitted for + blood c/s  Streptococcal bacteremia -Patient presented initially to the emergency room on 02/16/2020 with confusion above baseline and recorded temperature at home of 101.2 but with negative work-up in the emergency room -Blood cultures growing ram-positive cocci/coag neg staph as well as streptococcal species -Switch her IV Ancef from vancomycin based on culture sensitivity -Repeat blood cultures on 5/13 are negative thus far -No focalizing signs of infection,  skin could be source -Covid test negative  Essential hypertension -Stable without any medications  Dementia without behavioral disturbance -Not currently on medication. Followed by neurology  History of breast cancerstatus post lumpectomy, radiation and chemotherapy -Patient does not have a port -No acute concerns  Outpt palliative care  I was planning on discharging her today but husband refused as she has been sleeping all morning and not yet waking up he does not feel comfortable taking her home like this  Status is: Inpatient  Remains inpatient appropriate because:IV treatments appropriate due to intensity of illness or inability to take PO   Dispo: The patient is from: Home              Anticipated d/c is to: Home              Anticipated d/c date is: 1 day              Patient currently is not medically stable to d/c.  Patient requesting 1 more night stay as she does not feel comfortable going home yet     DVT prophylaxis: Lovenox Family Communication: Discussed with husband at bedside and Updated Daughter Marcie Bal over phone   All the records are reviewed and case discussed with Care Management/Social Worker. Management plans discussed with the patient, family and they are in agreement.  CODE STATUS: DNR  TOTAL TIME TAKING CARE OF THIS PATIENT: 35 minutes.   More than 50% of the time was spent in counseling/coordination of care: YES  POSSIBLE D/C IN 1 DAYS, DEPENDING ON CLINICAL CONDITION.   Max Sane M.D on 02/20/2020 at 1:08 PM  [facility]   CC: Primary care physician; Sofie Hartigan, MD  Note: This dictation was prepared with Dragon dictation along with smaller phrase technology. Any transcriptional errors that result from this process are unintentional.

## 2020-02-20 NOTE — Discharge Instructions (Signed)
Fever, Adult     A fever is an increase in your body's temperature. It often means a temperature of 100.62F (38C) or higher. Brief mild or moderate fevers often have no long-term effects. They often do not need treatment. Moderate or high fevers may make you feel uncomfortable. Sometimes, they can be a sign of a serious illness or disease. A fever that keeps coming back or that lasts a long time may cause you to lose water in your body (get dehydrated). You can take your temperature with a thermometer to see if you have a fever. Temperature can change with:  Age.  Time of day.  Where the thermometer is put in the body. Readings may vary when the thermometer is put: ? In the mouth (oral). ? In the butt (rectal). ? In the ear (tympanic). ? Under the arm (axillary). ? On the forehead (temporal). Follow these instructions at home: Medicines  Take over-the-counter and prescription medicines only as told by your doctor. Follow the dosing instructions carefully.  If you were prescribed an antibiotic medicine, take it as told by your doctor. Do not stop taking it even if you start to feel better. General instructions  Watch for any changes in your symptoms. Tell your doctor about them.  Rest as needed.  Drink enough fluid to keep your pee (urine) pale yellow.  Sponge yourself or bathe with room-temperature water as needed. This helps to lower your body temperature. Do not use ice water.  Do not use too many blankets or wear clothes that are too heavy.  If your fever was caused by an infection that spreads from person to person (is contagious), such as a cold or the flu: ? You should stay home from work and public places for at least 24 hours after your fever is gone. ? Your fever should be gone for at least 24 hours without the need to use medicines. Contact a doctor if:  You throw up (vomit).  You cannot eat or drink without throwing up.  You have watery poop (diarrhea).  It  hurts when you pee.  Your symptoms do not get better with treatment.  You have new symptoms.  You feel very weak. Get help right away if:  You are short of breath or have trouble breathing.  You are dizzy or you pass out (faint).  You feel mixed up (confused).  You have signs of not having enough water in your body, such as: ? Dark pee, very little pee, or no pee. ? Cracked lips. ? Dry mouth. ? Sunken eyes. ? Sleepiness. ? Weakness.  You have very bad pain in your belly (abdomen).  You keep throwing up or having watery poop.  You have a rash on your skin.  Your symptoms get worse all of a sudden. Summary  A fever is an increase in your body's temperature. It often means a temperature of 100.62F (38C) or higher.  Watch for any changes in your symptoms. Tell your doctor about them.  Take all medicines only as told by your doctor.  Do not go to work or other public places if your fever was caused by an illness that can spread to other people.  Get help right away if you have signs that you do not have enough water in your body. This information is not intended to replace advice given to you by your health care provider. Make sure you discuss any questions you have with your health care provider. Document Revised: 03/09/2018  Document Reviewed: 03/09/2018 Elsevier Patient Education  [employer].

## 2020-02-20 NOTE — Plan of Care (Signed)
Continuing with plan of care. 

## 2020-02-21 ENCOUNTER — Ambulatory Visit
Admission: EM | Admit: 2020-02-21 | Discharge: 2020-02-21 | Disposition: A | Discharge status: Home / Self Care | Payer: Medicare HMO | Attending: Emergency Medicine | Admitting: Emergency Medicine

## 2020-02-21 ENCOUNTER — Other Ambulatory Visit: Payer: Self-pay

## 2020-02-21 DIAGNOSIS — W4904XA Ring or other jewelry causing external constriction, initial encounter: Secondary | ICD-10-CM | POA: Diagnosis not present

## 2020-02-21 DIAGNOSIS — S60449A External constriction of unspecified finger, initial encounter: Secondary | ICD-10-CM

## 2020-02-21 MED ORDER — IBUPROFEN 400 MG PO TABS
400.0000 mg | ORAL_TABLET | Freq: Once | ORAL | Status: AC
  Administered 2020-02-21: 400 mg via ORAL

## 2020-02-21 MED ORDER — ACETAMINOPHEN 500 MG PO TABS
500.0000 mg | ORAL_TABLET | Freq: Once | ORAL | Status: AC
  Administered 2020-02-21: 500 mg via ORAL

## 2020-02-21 NOTE — ED Triage Notes (Signed)
Pt just released from hospital today. Pt has had swelling in her hands for past week and husband reports he asked for rings to be cut off before her discharge but they forgot to do it.

## 2020-02-21 NOTE — Plan of Care (Signed)
Patient husband states ready for discharge and will be transporting wife home. Wife with baseline dementia. PIVx2 remvoed with tip intact. Reviewed discharge instructions and medications changes with patient and husband who verbalized understanding.

## 2020-02-21 NOTE — Care Management Important Message (Signed)
Important Message  Patient Details  Name: Cynthia Walker MRN: [id number] Date of Birth: [birth year]   Medicare Important Message Given:  No  Patient discharged prior to arrival to unit to deliver concurrent Medicare IM.   Dannette Barbara 02/21/2020, 11:28 AM

## 2020-02-21 NOTE — Discharge Instructions (Addendum)
I am giving her some Tylenol and ibuprofen.  We will give her a cool compress to help with the swelling.

## 2020-02-21 NOTE — ED Provider Notes (Signed)
HPI  SUBJECTIVE:  Cynthia Walker is a right-handed 85 y.o. female who presents with left ring finger edema for the past 2 weeks.  She was seen by her primary care physician, given Lasix, advised ice and elevation.  Patient could not tolerate the ice baths.  They did offer to cut off her ring while she was in the hospital, but the husband declined removal at that time.  He states that they tried the string method as well without success.  Her skin is intact.  There is no erythema, pain.  She has a past medical history of breast cancer status post right lumpectomy, radiation and chemo 2009, dementia, hypertension.  Discharged from hospital today for MRSA/ streptococcal bacteremia.    Past Medical History:  Diagnosis Date  . Breast cancer [facility]) 2009   Right breast CA with lumpectomy, radiation and chemo tx's.  . Dementia ([place])   . Hypertension   . Personal history of chemotherapy   . Personal history of radiation therapy     Past Surgical History:  Procedure Laterality Date  . BREAST BIOPSY Right 03/15/2008   invasive mammary carcinoma  . BREAST LUMPECTOMY Right 04/14/2008   Pathology revealed a 1.1 cm grade I invasive ductal carcinoma. Margins were negative.   . COLONOSCOPY WITH PROPOFOL N/A 07/10/2015   Procedure: COLONOSCOPY WITH PROPOFOL;  Surgeon: Hulen Luster, MD;  Location: [facility] END[facility]COPY;  Service: Gastroenterology;  Laterality: N/A;  . INTRAMEDULLARY (IM) NAIL INTERTROCHANTERIC Left 06/27/2019   Procedure: INTRAMEDULLARY (IM) NAIL INTERTROCHANTRIC;  Surgeon: Creig Hines, MD;  Location: [facility] ORS;  Service: Orthopedics;  Laterality: Left;  . TONSILLECTOMY     age of 85 years old    Family History  Problem Relation Age of Onset  . Cancer Brother        lung cancer  . Cancer Sister        not sure  . Breast cancer Sister   . Heart disease Father   . Hypertension Father     Social History   Tobacco Use  . Smoking status: Never Smoker  . Smokeless tobacco: Never Used   Substance Use Topics  . Alcohol use: No  . Drug use: No     Current Facility-Administered Medications:  .  acetaminophen (TYLENOL) tablet 500 mg, 500 mg, Oral, Once, Melynda Ripple, MD .  ibuprofen (ADVIL) tablet 400 mg, 400 mg, Oral, Once, Melynda Ripple, MD  Current Outpatient Medications:  .  donepezil (ARICEPT) 5 MG tablet, Take 5 mg by mouth daily., Disp: , Rfl:  .  furosemide (LASIX) 20 MG tablet, Take 20 mg by mouth daily., Disp: , Rfl:  .  Multiple Vitamin (MULTI-VITAMINS) TABS, Take by mouth., Disp: , Rfl:  .  sertraline (ZOLOFT) 50 MG tablet, Take 50 mg by mouth daily., Disp: , Rfl:   Allergies  Allergen Reactions  . Flu Virus Vaccine     Other reaction(s): Other (See Comments) Stomach cramps  . Latex Swelling  . Other     Other reaction(s): Unknown Allergy to eggs  . Procaine Other (See Comments)    weakness  . Soy Isoflavones     Other reaction(s): Other (See Comments) Soy causes legs to feel like rubber  . Penicillins Rash    Has patient had a PCN reaction causing immediate rash, facial/tongue/throat swelling, SOB or lightheadedness with hypotension: No Has patient had a PCN reaction causing severe rash involving mucus membranes or skin necrosis: No Has patient had a PCN reaction that required hospitalization:  No Has patient had a PCN reaction occurring within the last 10 years: No If all of the above answers are "NO", then may proceed with Cephalosporin use.      ROS  As noted in HPI.   Physical Exam  BP 125/75 (BP Location: Right Arm)   Pulse 78   Temp (!) 97.5 F (36.4 C) (Oral)   Resp 18   Ht 5\' 4"  (1.626 m)   Wt 58.5 kg   SpO2 99%   BMI 22.14 kg/m   Constitutional: Well developed, well nourished, no acute distress Eyes:  EOMI, conjunctiva normal bilaterally HENT: Normocephalic, atraumatic,mucus membranes moist Respiratory: Normal inspiratory effort Cardiovascular: Normal rate GI: nondistended skin: No rash, skin  intact Musculoskeletal: Left ring finger swelling.  Finger nontender.  Cap refill less than 2 seconds.  Skin intact. Neurologic: Alert & oriented x 3, no focal neuro deficits Psychiatric: Speech and behavior appropriate   ED Course   Medications  acetaminophen (TYLENOL) tablet 500 mg (has no administration in time range)  ibuprofen (ADVIL) tablet 400 mg (has no administration in time range)    No orders of the defined types were placed in this encounter.   No results found for this or any previous visit (from the past 24 hour(s)). No results found.  ED Clinical Impression  1. Tight ring on finger      ED Assessment/Plan   Procedure note: Wrapped finger with tourniquet from distally to proximally.  Left on for about 2 minutes with improvement in the edema.  Attempted to get the ring off using lubriucant several times without success.  Patient was unable to tolerate this.  Then attempted a digital block.  Patient was unable to tolerate this as well.  Do not think that the patient will be able to tolerate a manual ring remover, husband states that he has contacted the hospital, and they will see her on the floor that she was discharged from and will remove it with an electric ring cutter.  Giving her 400 mg of ibuprofen and 500 mg of Tylenol prior to discharge.  Knee function was normal as of this morning  Finger was pink prior to discharge.   Meds ordered this encounter  Medications  . acetaminophen (TYLENOL) tablet 500 mg  . ibuprofen (ADVIL) tablet 400 mg    *This clinic note was created using [employer]. Therefore, there may be occasional mistakes despite careful proofreading.   ?   Melynda Ripple, MD 02/21/20 [phone]

## 2020-02-22 ENCOUNTER — Telehealth: Payer: Self-pay | Admitting: Primary Care

## 2020-02-22 ENCOUNTER — Ambulatory Visit: Payer: Medicare HMO | Admitting: Physical Therapy

## 2020-02-22 ENCOUNTER — Encounter: Payer: Self-pay | Admitting: Physical Therapy

## 2020-02-22 DIAGNOSIS — M6281 Muscle weakness (generalized): Secondary | ICD-10-CM

## 2020-02-22 DIAGNOSIS — R262 Difficulty in walking, not elsewhere classified: Secondary | ICD-10-CM

## 2020-02-22 DIAGNOSIS — M25662 Stiffness of left knee, not elsewhere classified: Secondary | ICD-10-CM | POA: Diagnosis present

## 2020-02-22 DIAGNOSIS — M25661 Stiffness of right knee, not elsewhere classified: Secondary | ICD-10-CM

## 2020-02-22 LAB — CULTURE, BLOOD (ROUTINE X 2)
Culture: NO GROWTH
Culture: NO GROWTH
Special Requests: ADEQUATE

## 2020-02-22 NOTE — Therapy (Signed)
[facility] [address] [place], [place], [address] Phone: [phone]   Fax:  [phone]  Physical Therapy Treatment  Patient Details  Name: Cynthia Walker MRN: [id number] Date of Birth: [birth year] Referring Provider (PT): Feldpausch   Encounter Date: 02/22/2020  PT End of Session - 02/22/20 1113    Visit Number  3    Number of Visits  24    Date for PT Re-Evaluation  05/03/20    PT Start Time  1105    PT Stop Time  1150    PT Time Calculation (min)  45 min    Equipment Utilized During Treatment  Gait belt    Activity Tolerance  Patient tolerated treatment well    Behavior During Therapy  [facility] for tasks assessed/performed   Highly distractable      Past Medical History:  Diagnosis Date  . Breast cancer [facility]) 2009   Right breast CA with lumpectomy, radiation and chemo tx's.  . Dementia ([place])   . Hypertension   . Personal history of chemotherapy   . Personal history of radiation therapy     Past Surgical History:  Procedure Laterality Date  . BREAST BIOPSY Right 03/15/2008   invasive mammary carcinoma  . BREAST LUMPECTOMY Right 04/14/2008   Pathology revealed a 1.1 cm grade I invasive ductal carcinoma. Margins were negative.   . COLONOSCOPY WITH PROPOFOL N/A 07/10/2015   Procedure: COLONOSCOPY WITH PROPOFOL;  Surgeon: Hulen Luster, MD;  Location: [facility] ENDOSCOPY;  Service: Gastroenterology;  Laterality: N/A;  . INTRAMEDULLARY (IM) NAIL INTERTROCHANTERIC Left 06/27/2019   Procedure: INTRAMEDULLARY (IM) NAIL INTERTROCHANTRIC;  Surgeon: Creig Hines, MD;  Location: [facility] ORS;  Service: Orthopedics;  Laterality: Left;  . TONSILLECTOMY     age of 85 years old    There were no vitals filed for this visit.  Subjective Assessment - 02/22/20 1110    Subjective  Patient presents to clinic with spouse s/p discharge from hospital for MRSA infection. Patient's spouse notes soem loss of strength and continued L hand and arm  edema for which he would like to have her rings cut off. Patient's spouse notes that he is adapting how to help with ADLs and dressing at home using seated postures for dressing.    Patient is accompained by:  Family member   husband, [name]   Pertinent History  Femur fracture, left (CMS-HCC) 06/26/2019; Patient was ambulatory without AD prior to the fall that resulted in fx. Patient's spouse notes that she was capable of walking ~0.5 miles prior to the fall. Patient's spouse reports that she can at present stand for up to 10 min, but not consistently and not without assistance. While working with HHPT patient was able to ambulate with a RW for ~ 200 feet with rest breaks throughout.    Limitations  Lifting;Standing;Walking;House hold activities    How long can you sit comfortably?  Unlimited    How long can you stand comfortably?  5-10 min with support    How long can you walk comfortably?  < 200 feet    Patient Stated Goals  2/2 to cognitive state, patient did not state speicifc goals. Spouse would like to see her ambulatory within the house.    Currently in Pain?  No/denies      TREATMENT  Neuromuscular Re-education: // bars: Standing posture/balance, BUE support, CGA, 2x 90sec, max VCs for erect posture Standing lateral weight shifts, BUE support, CGA, x10 each side with VCs for  orientation to task Standing anterior/posterior weight shifts, BUE support, CGA, x10 each side with VCs for orientation to task Standing balance with mini marches, x30 sec, BUE support, CGA, max VCs for safety and orientation to task  Therapeutic Exercise: Seated hamstring stretch, BLE, 2 min x2 each leg for improved ability to stand  Patient educated throughout session on appropriate technique and form using multi-modal cueing, HEP, and activity modification. Patient articulated understanding and returned demonstration.  Patient Response to interventions: Patient states, "Okay that's enough for today." after mini  marches.  ASSESSMENT Patient presents to clinic with excellent motivation to participate in therapy. Patient demonstrates deficits incognition, posture, BLE strength, gait, balance, and function. Patient able to perform mini marches with BUE support, CGA, and max encouragement during today's session and responded positively to active interventions. Patient will benefit from continued skilled therapeutic intervention to address remaining deficits in cognition, posture, BLE strength, gait, balance, and function in order to increase function and improve overall QOL.    PT Long Term Goals - 02/09/20 1415      PT LONG TERM GOAL #1   Title  Patient will be modified independent with spousal support with HEP for improved strength and balance in order to decrease fall risk and dependence at home and in the community.    Baseline  IE: not demonstrated    Time  12    Period  Weeks    Status  New    Target Date  05/03/20      PT LONG TERM GOAL #2   Title  Patient will demonstrate improved function as evidenced by a score of 44 on FOTO measure for full participation in activities at home and in the community.    Baseline  IE: 16    Time  12    Period  Weeks    Status  New    Target Date  05/03/20      PT LONG TERM GOAL #3   Title  Patient will perform STS with BUE support with Modified IND/SBA with VCs for sequencing and safety in order to participate more fully in basic ADLs.    Baseline  IE: Mod/MinA, MAX VCs and TCs for sequencing/safety    Time  12    Period  Weeks    Status  New    Target Date  05/03/20      PT LONG TERM GOAL #4   Title  Patient will demonstrate improved B knee extension ROM as evidenced by lacking < 10 degrees of B knee extension PROM/AROM in order to achieve standing posture with greater independence.    Baseline  IE: B lacking 15-20 degrees of knee extension    Time  12    Period  Weeks    Status  New    Target Date  05/03/20      PT LONG TERM GOAL #5   Title   Patient will demonstrate improved static standing balance as evidenced by ability to maintain standing posture with CGA/SBA, SUE support for 4 minutes in order to participate in basic ADLs.    Baseline  IE: 90 sec, MinA, BUE support    Time  12    Period  Weeks    Status  New    Target Date  05/03/20            Plan - 02/22/20 1227    Clinical Impression Statement  Patient presents to clinic with excellent motivation to participate in therapy. Patient demonstrates  deficits incognition, posture, BLE strength, gait, balance, and function. Patient able to perform mini marches with BUE support, CGA, and max encouragement during today's session and responded positively to active interventions. Patient will benefit from continued skilled therapeutic intervention to address remaining deficits in cognition, posture, BLE strength, gait, balance, and function in order to increase function and improve overall QOL.    Personal Factors and Comorbidities  Age;Education;Comorbidity 3+;Past/Current Experience;Time since onset of injury/illness/exacerbation;Fitness;Transportation;Other   Cognitive State   Comorbidities  anemia, breast cancer, chronic constipation, diverticulosis, hypertension, hyperlipidemia, osteoporosis    Examination-Activity Limitations  Bathing;Dressing;Transfers;Hygiene/Grooming;Bed Mobility;Squat;Lift;Bend;Locomotion Level;Stairs;Reach Overhead;Stand;Toileting;Carry    Examination-Participation Restrictions  Church;Interpersonal Relationship;Personal Finances;Yard Work;Cleaning;Laundry;Community Activity;Medication Management;Shop;Driving;Meal Prep    Stability/Clinical Decision Making  Evolving/Moderate complexity    Rehab Potential  Fair    PT Frequency  2x / week    PT Duration  12 weeks    PT Treatment/Interventions  Moist Heat;DME Instruction;Therapeutic activities;Functional mobility training;Stair training;Gait training;Therapeutic exercise;Balance training;Neuromuscular  re-education;Cognitive remediation;Orthotic Fit/Training;Patient/family education;Manual techniques;Scar mobilization;Passive range of motion;Energy conservation;Taping    PT Next Visit Plan  Hamstring extensibility, standing tolerance with weight shifts    PT Home Exercise Plan  hamstring stretch    Consulted and Agree with Plan of Care  Patient;Family member/caregiver    Family Member Consulted  Jamielynn Wigley, husband       Patient will benefit from skilled therapeutic intervention in order to improve the following deficits and impairments:  Abnormal gait, Decreased balance, Decreased endurance, Decreased mobility, Difficulty walking, Hypomobility, Postural dysfunction, Improper body mechanics, Increased edema, Decreased range of motion, Decreased cognition, Decreased activity tolerance, Decreased coordination, Decreased safety awareness, Decreased strength, Impaired flexibility  Visit Diagnosis: Decreased range of motion (ROM) of both knees  Muscle weakness (generalized)  Difficulty in walking, not elsewhere classified     Problem List Patient Active Problem List   Diagnosis Date Noted  . Encounter for hospice care discussion   . MRSA bacteremia 02/17/2020  . Streptococcal bacteremia 02/17/2020  . Bacteremia 02/17/2020  . History of breast cancer 02/17/2020  . Goals of care, counseling/discussion   . Palliative care by specialist   . DNR (do not resuscitate) discussion   . Breast pain, right 01/11/2020  . Femoral neck fracture ([place]) 06/27/2019  . Femur fracture, left ([place]) 06/26/2019  . Right lower lobe pulmonary nodule 05/04/2019  . Syncope 09/27/2017  . Atypical chest pain 09/03/2017  . Essential hypertension 09/03/2017  . Short-term memory loss 09/03/2017  . Osteoporosis 04/02/2016  . Breast cancer, right [facility]) 04/06/2008   Myles Gip PT, DPT [phone] 02/22/2020, 12:40 PM  [facility] [address] [place],  [place], [address] Phone: [phone]   Fax:  [phone]  Name: KARLISHA MATHENA MRN: [id number] Date of Birth: 04-23-1935

## 2020-02-22 NOTE — Telephone Encounter (Signed)
Spoke with patient's husband Delfino Lovett regarding Palliative services and all questions were answered and he was in agreement with this.  I have scheduled an In-person Consult for 03/07/20 @ 1 PM.

## 2020-02-23 NOTE — Discharge Summary (Signed)
[facility] [place] NAME: Cynthia Walker    MR#:  [id number]  DATE OF BIRTH:  1935/07/15  DATE OF ADMISSION:  02/16/2020   ADMITTING PHYSICIAN: Max Sane, MD  DATE OF DISCHARGE: 02/21/2020 11:40 AM  PRIMARY CARE PHYSICIAN: Sofie Hartigan, MD   ADMISSION DIAGNOSIS:  Bacteremia [R78.81] MRSA bacteremia [R78.81, B95.62] DISCHARGE DIAGNOSIS:  Principal Problem:   Streptococcal bacteremia Active Problems:   Essential hypertension   MRSA bacteremia   Bacteremia   History of breast cancer   Encounter for hospice care discussion  SECONDARY DIAGNOSIS:   Past Medical History:  Diagnosis Date  . Breast cancer [facility]) 2009   Right breast CA with lumpectomy, radiation and chemo tx's.  . Dementia ([place])   . Hypertension   . Personal history of chemotherapy   . Personal history of radiation therapy    HOSPITAL COURSE:  [facility] a 85 y.o.femalewith medical history significant forhypertension, early dementia and history of breast cancer status post right lumpectomy, radiation and chemo in 2009, osteoporosis admitted for + blood c/s  Bacteremia -Patient presented initially to the emergency room on 02/16/2020 with confusion above baseline and recorded temperature at home of 101.2 but with negative work-up in the emergency room -Repeat blood cultures on 5/13 are negative thus far -No focalizing signs of infection, skin could be source -Covid testnegative - Multiple organisms in the blood culture 5/12 -from rt forearm 4 different organisms -- this is  contaminantion rather than true pathogen  - harware on left femur- site healed well and there is no swelling except for a superficial scratch which is not infected - she remained afebrile and no leukocytosis.Marland Kitchen after d/w ID, abx were stopped.  Essential hypertension -Stable without any medications  Dementia without behavioral disturbance -Not currently on medication.  History of  breast cancerstatus post lumpectomy, radiation and chemotherapy -No acute concerns   outpt palliative care DISCHARGE CONDITIONS:  fair CONSULTS OBTAINED:   DRUG ALLERGIES:   Allergies  Allergen Reactions  . Flu Virus Vaccine     Other reaction(s): Other (See Comments) Stomach cramps  . Latex Swelling  . Other     Other reaction(s): Unknown Allergy to eggs  . Procaine Other (See Comments)    weakness  . Soy Isoflavones     Other reaction(s): Other (See Comments) Soy causes legs to feel like rubber  . Penicillins Rash    Has patient had a PCN reaction causing immediate rash, facial/tongue/throat swelling, SOB or lightheadedness with hypotension: No Has patient had a PCN reaction causing severe rash involving mucus membranes or skin necrosis: No Has patient had a PCN reaction that required hospitalization: No Has patient had a PCN reaction occurring within the last 10 years: No If all of the above answers are "NO", then may proceed with Cephalosporin use.    DISCHARGE MEDICATIONS:   Allergies as of 02/21/2020      Reactions   Flu Virus Vaccine    Other reaction(s): Other (See Comments) Stomach cramps   Latex Swelling   Other    Other reaction(s): Unknown Allergy to eggs   Procaine Other (See Comments)   weakness   Soy Isoflavones    Other reaction(s): Other (See Comments) Soy causes legs to feel like rubber   Penicillins Rash   Has patient had a PCN reaction causing immediate rash, facial/tongue/throat swelling, SOB or lightheadedness with hypotension: No Has patient had a PCN reaction causing severe rash involving mucus membranes or skin  necrosis: No Has patient had a PCN reaction that required hospitalization: No Has patient had a PCN reaction occurring within the last 10 years: No If all of the above answers are "NO", then may proceed with Cephalosporin use.      Medication List    STOP taking these medications   calcium-vitamin D 500-200 MG-UNIT  tablet Commonly known as: OSCAL WITH D   celecoxib 200 MG capsule Commonly known as: CELEBREX   lidocaine 5 % Commonly known as: Lidoderm   metoprolol tartrate 25 MG tablet Commonly known as: LOPRESSOR     TAKE these medications   donepezil 5 MG tablet Commonly known as: ARICEPT Take 5 mg by mouth daily.   furosemide 20 MG tablet Commonly known as: LASIX Take 20 mg by mouth daily.   Multi-Vitamins Tabs Take by mouth.   sertraline 50 MG tablet Commonly known as: ZOLOFT Take 50 mg by mouth daily.      DISCHARGE INSTRUCTIONS:   DIET:  Regular diet DISCHARGE CONDITION:  Fair ACTIVITY:  Activity as tolerated OXYGEN:  Home Oxygen: No.  Oxygen Delivery: room air DISCHARGE LOCATION:  home with HHPT, OT & Palliative care to follow  If you experience worsening of your admission symptoms, develop shortness of breath, life threatening emergency, suicidal or homicidal thoughts you must seek medical attention immediately by calling 911 or calling your MD immediately  if symptoms less severe.  You Must read complete instructions/literature along with all the possible adverse reactions/side effects for all the Medicines you take and that have been prescribed to you. Take any new Medicines after you have completely understood and accpet all the possible adverse reactions/side effects.   Please note  You were cared for by a hospitalist during your hospital stay. If you have any questions about your discharge medications or the care you received while you were in the hospital after you are discharged, you can call the unit and asked to speak with the hospitalist on call if the hospitalist that took care of you is not available. Once you are discharged, your primary care physician will handle any further medical issues. Please note that NO REFILLS for any discharge medications will be authorized once you are discharged, as it is imperative that you return to your primary care physician  (or establish a relationship with a primary care physician if you do not have one) for your aftercare needs so that they can reassess your need for medications and monitor your lab values.    On the day of Discharge:  VITAL SIGNS:  Blood pressure 116/66, pulse 79, temperature 99.7 F (37.6 C), temperature source Oral, resp. rate 15, height 5\' 4"  (1.626 m), weight 59 kg, SpO2 96 %. PHYSICAL EXAMINATION:  GENERAL:  85 y.o.-year-old patient lying in the bed with no acute distress.  EYES: Pupils equal, round, reactive to light and accommodation. No scleral icterus. Extraocular muscles intact.  HEENT: Head atraumatic, normocephalic. Oropharynx and nasopharynx clear.  NECK:  Supple, no jugular venous distention. No thyroid enlargement, no tenderness.  LUNGS: Normal breath sounds bilaterally, no wheezing, rales,rhonchi or crepitation. No use of accessory muscles of respiration.  CARDIOVASCULAR: S1, S2 normal. No murmurs, rubs, or gallops.  ABDOMEN: Soft, non-tender, non-distended. Bowel sounds present. No organomegaly or mass.  EXTREMITIES: No pedal edema, cyanosis, or clubbing.  NEUROLOGIC: Cranial nerves II through XII are intact. Muscle strength 5/5 in all extremities. Sensation intact. Gait not checked.  PSYCHIATRIC: The patient is alert and oriented x 3.  SKIN:  No obvious rash, lesion, or ulcer.  DATA REVIEW:   CBC Recent Labs  Lab 02/19/20 0554  WBC 7.7  HGB 11.5*  HCT 34.8*  PLT 350    Chemistries  Recent Labs  Lab 02/19/20 0554  NA 137  K 3.3*  CL 103  CO2 27  GLUCOSE 127*  BUN <5*  CREATININE 0.47  CALCIUM 8.3*  MG 1.8     Outpatient follow-up Follow-up Information    Sofie Hartigan, MD. Go on 02/28/2020.   Specialty: Family Medicine Why: 11:30am appointment Contact information: [address] [place] [address] [phone]            Management plans discussed with the patient, family and they are in agreement.  CODE STATUS: Prior   TOTAL  TIME TAKING CARE OF THIS PATIENT: 45 minutes.    Max Sane M.D on 02/23/2020 at 6:25 PM  [facility]   CC: Primary care physician; Sofie Hartigan, MD   Note: This dictation was prepared with Dragon dictation along with smaller phrase technology. Any transcriptional errors that result from this process are unintentional.

## 2020-02-28 ENCOUNTER — Emergency Department
Admission: EM | Admit: 2020-02-28 | Discharge: 2020-02-28 | Disposition: A | Discharge status: Home / Self Care | Payer: Medicare HMO | Attending: Emergency Medicine | Admitting: Emergency Medicine

## 2020-02-28 ENCOUNTER — Other Ambulatory Visit: Payer: Self-pay

## 2020-02-28 DIAGNOSIS — G478 Other sleep disorders: Secondary | ICD-10-CM | POA: Diagnosis not present

## 2020-02-28 DIAGNOSIS — Z853 Personal history of malignant neoplasm of breast: Secondary | ICD-10-CM | POA: Insufficient documentation

## 2020-02-28 DIAGNOSIS — F0390 Unspecified dementia without behavioral disturbance: Secondary | ICD-10-CM | POA: Insufficient documentation

## 2020-02-28 DIAGNOSIS — Z79899 Other long term (current) drug therapy: Secondary | ICD-10-CM | POA: Diagnosis not present

## 2020-02-28 DIAGNOSIS — Z91040 Latex allergy status: Secondary | ICD-10-CM | POA: Diagnosis not present

## 2020-02-28 DIAGNOSIS — I10 Essential (primary) hypertension: Secondary | ICD-10-CM | POA: Diagnosis not present

## 2020-02-28 DIAGNOSIS — R4182 Altered mental status, unspecified: Secondary | ICD-10-CM | POA: Diagnosis present

## 2020-02-28 LAB — URINALYSIS, COMPLETE (UACMP) WITH MICROSCOPIC
Bilirubin Urine: NEGATIVE
Glucose, UA: NEGATIVE mg/dL
Hgb urine dipstick: NEGATIVE
Ketones, ur: NEGATIVE mg/dL
Leukocytes,Ua: NEGATIVE
Nitrite: NEGATIVE
Protein, ur: NEGATIVE mg/dL
Specific Gravity, Urine: 1.012 (ref 1.005–1.030)
pH: 7 (ref 5.0–8.0)

## 2020-02-28 LAB — CBC
HCT: 31.5 % — ABNORMAL LOW (ref 36.0–46.0)
Hemoglobin: 10.6 g/dL — ABNORMAL LOW (ref 12.0–15.0)
MCH: 31.4 pg (ref 26.0–34.0)
MCHC: 33.7 g/dL (ref 30.0–36.0)
MCV: 93.2 fL (ref 80.0–100.0)
Platelets: 354 10*3/uL (ref 150–400)
RBC: 3.38 MIL/uL — ABNORMAL LOW (ref 3.87–5.11)
RDW: 11.8 % (ref 11.5–15.5)
WBC: 6 10*3/uL (ref 4.0–10.5)
nRBC: 0 % (ref 0.0–0.2)

## 2020-02-28 LAB — COMPREHENSIVE METABOLIC PANEL
ALT: 19 U/L (ref 0–44)
AST: 19 U/L (ref 15–41)
Albumin: 2.7 g/dL — ABNORMAL LOW (ref 3.5–5.0)
Alkaline Phosphatase: 70 U/L (ref 38–126)
Anion gap: 7 (ref 5–15)
BUN: 10 mg/dL (ref 8–23)
CO2: 28 mmol/L (ref 22–32)
Calcium: 8.4 mg/dL — ABNORMAL LOW (ref 8.9–10.3)
Chloride: 102 mmol/L (ref 98–111)
Creatinine, Ser: 0.52 mg/dL (ref 0.44–1.00)
GFR calc Af Amer: >60 mL/min (ref >60)
GFR calc non Af Amer: >60 mL/min (ref >60)
Glucose, Bld: 95 mg/dL (ref 70–99)
Potassium: 3.6 mmol/L (ref 3.5–5.1)
Sodium: 137 mmol/L (ref 135–145)
Total Bilirubin: 0.5 mg/dL (ref 0.3–1.2)
Total Protein: 6 g/dL — ABNORMAL LOW (ref 6.5–8.1)

## 2020-02-28 LAB — TROPONIN I (HIGH SENSITIVITY): Troponin I (High Sensitivity): 4 ng/L (ref <18)

## 2020-02-28 MED ORDER — SODIUM CHLORIDE 0.9 % IV BOLUS
500.0000 mL | Freq: Once | INTRAVENOUS | Status: AC
  Administered 2020-02-28: 500 mL via INTRAVENOUS

## 2020-02-28 MED ORDER — SODIUM CHLORIDE 0.9% FLUSH: 3.0000 mL | Freq: Once | INTRAVENOUS | Status: DC

## 2020-02-28 NOTE — ED Notes (Signed)
Pt unable to urinate with external cath. Pt informed of needing to complete In an Out cath to obtain urine.

## 2020-02-28 NOTE — ED Provider Notes (Signed)
[facility] Emergency Department Provider Note  ____________________________________________   I have reviewed the triage vital signs and the nursing notes.   HISTORY  Chief Complaint Altered Mental Status   History limited by and level 5 caveat due to: Dementia   HPI Cynthia Walker is a 85 y.o. female who presents to the emergency department today because of concerns for decreased responsiveness.  Patient does have a history of dementia so cannot give any history.  History is obtained from husband.  He states that this morning when he tried to wake her up from bed he was unable to wake her.  He states she would simply mumble.  She did have about an hour last night where she was whimpering.  The husband and home health aide were unable to wake the patient throughout the morning so then at 911 was called.  At the time of my exam however the patient is awake and alert.  Husband states this is her baseline appearance.  He has not noticed any fever since her recent hospitalization for weakness.  He has noticed some right hand swelling in addition to left hand swelling which was present during previous admission.   Records reviewed. Per medical record review patient has a history of recent admission to the hospital because of concerns for weakness.  Initially thought to be due to bacteremia however blood cultures were negative.  Past Medical History:  Diagnosis Date  . Breast cancer [facility]) 2009   Right breast CA with lumpectomy, radiation and chemo tx's.  . Dementia ([place])   . Hypertension   . Personal history of chemotherapy   . Personal history of radiation therapy     Patient Active Problem List   Diagnosis Date Noted  . Encounter for hospice care discussion   . MRSA bacteremia 02/17/2020  . Streptococcal bacteremia 02/17/2020  . Bacteremia 02/17/2020  . History of breast cancer 02/17/2020  . Goals of care, counseling/discussion   . Palliative care by  specialist   . DNR (do not resuscitate) discussion   . Breast pain, right 01/11/2020  . Femoral neck fracture ([place]) 06/27/2019  . Femur fracture, left ([place]) 06/26/2019  . Right lower lobe pulmonary nodule 05/04/2019  . Syncope 09/27/2017  . Atypical chest pain 09/03/2017  . Essential hypertension 09/03/2017  . Short-term memory loss 09/03/2017  . Osteoporosis 04/02/2016  . Breast cancer, right ([place]) 04/06/2008    Past Surgical History:  Procedure Laterality Date  . BREAST BIOPSY Right 03/15/2008   invasive mammary carcinoma  . BREAST LUMPECTOMY Right 04/14/2008   Pathology revealed a 1.1 cm grade I invasive ductal carcinoma. Margins were negative.   . COLONOSCOPY WITH PROPOFOL N/A 07/10/2015   Procedure: COLONOSCOPY WITH PROPOFOL;  Surgeon: Hulen Luster, MD;  Location[facility] ENDOSCOPY;  Service: Gastroenterology;  Laterality: N/A;  . INTRAMEDULLARY (IM) NAIL INTERTROCHANTERIC Left 06/27/2019   Procedure: INTRAMEDULLARY (IM) NAIL INTERTROCHANTRIC;  Surgeon: Creig Hines, MD;  Location: [facility] ORS;  Service: Orthopedics;  Laterality: Left;  . TONSILLECTOMY     age of 85 years old    Prior to Admission medications   Medication Sig Start Date End Date Taking? Authorizing Provider  donepezil (ARICEPT) 5 MG tablet Take 5 mg by mouth daily. 02/07/20   [provider]  furosemide (LASIX) 20 MG tablet Take 20 mg by mouth daily. 02/08/20   [provider]  Multiple Vitamin (MULTI-VITAMINS) TABS Take by mouth.    [provider]  sertraline (ZOLOFT) 50 MG  tablet Take 50 mg by mouth daily. 02/09/20   [provider]    Allergies Flu virus vaccine, Latex, Other, Procaine, Soy isoflavones, and Penicillins  Family History  Problem Relation Age of Onset  . Cancer Brother        lung cancer  . Cancer Sister        not sure  . Breast cancer Sister   . Heart disease Father   . Hypertension Father     Social History Social History   Tobacco Use  . Smoking  status: Never Smoker  . Smokeless tobacco: Never Used  Substance Use Topics  . Alcohol use: No  . Drug use: No    Review of Systems Unable to obtain secondary to dementia ____________________________________________   PHYSICAL EXAM:  VITAL SIGNS: ED Triage Vitals  Enc Vitals Group     BP 02/28/20 1205 (!) 113/57     Pulse Rate 02/28/20 1205 72     Resp 02/28/20 1205 15     Temp 02/28/20 1205 98.2 F (36.8 C)     Temp Source 02/28/20 1205 Oral     SpO2 02/28/20 1205 97 %     Weight 02/28/20 1113 145 lb (65.8 kg)     Height 02/28/20 1113 5\' 4"  (1.626 m)   Constitutional: Awake and alert Eyes: Conjunctivae are normal.  ENT      Head: Normocephalic and atraumatic.      Nose: No congestion/rhinnorhea.      Mouth/Throat: Mucous membranes are moist.      Neck: No stridor. Hematological/Lymphatic/Immunilogical: No cervical lymphadenopathy. Cardiovascular: Normal rate, regular rhythm.  No murmurs, rubs, or gallops.  Respiratory: Normal respiratory effort without tachypnea nor retractions. Breath sounds are clear and equal bilaterally. No wheezes/rales/rhonchi. Gastrointestinal: Soft and non tender. No rebound. No guarding.  Genitourinary: Deferred Musculoskeletal: Normal range of motion in all extremities. No lower extremity edema. Neurologic: Dementia.  Awake and alert.  Able to answer and 1 word and short sentences.  Moving all extremities. Skin:  Skin is warm, dry and intact. No rash noted. ____________________________________________    LABS (pertinent positives/negatives)  CBC wbc 6.0, hgb 10.6, plt 354 CMP wnl except ca 8.4, pro 6.0, alb 2.7 Trop hs 4 UA hazy, rare bacteria otherwise unremarkable  ____________________________________________    RADIOLOGY  None  ____________________________________________   PROCEDURES  Procedures  ____________________________________________   INITIAL IMPRESSION / ASSESSMENT AND PLAN / ED COURSE  Pertinent labs &  imaging results that were available during my care of the patient were reviewed by me and considered in my medical decision making (see chart for details).   Patient presented to the emergency department today because family was having a hard time getting her up this morning.  At the time my exam patient is awake and alert.  Husband states she is at baseline mental status.  Blood work without any concerning abnormalities that would necessarily explain the patient's symptoms.  Troponin was negative.  UA showed rare bacteria but no other signs of infection.  At this time given the patient back to baseline I do think is reasonable for her to be discharged home.  Discussed this with the husband.  ____________________________________________   FINAL CLINICAL IMPRESSION(S) / ED DIAGNOSES  Final diagnoses:  Difficulty waking     Note: This dictation was prepared with Dragon dictation. Any transcriptional errors that result from this process are unintentional     Nance Pear, MD 02/28/20 1935

## 2020-02-28 NOTE — Discharge Instructions (Addendum)
Please seek medical attention for any high fevers, chest pain, shortness of breath, change in behavior, persistent vomiting, bloody stool or any other new or concerning symptoms.  

## 2020-02-28 NOTE — ED Notes (Signed)
Family at bedside. Pt given apple juice

## 2020-02-28 NOTE — ED Triage Notes (Signed)
Pt comes into the ED via EMS from home with a hx of dementia and this morning family reports the pt not responding as quickly as normal. Report a strong urine odor. 98.9 temp, not normally verbally, non ambulatory. CBG 175, 144/70, 80's HR.

## 2020-02-28 NOTE — ED Notes (Signed)
EDT Dorian at bedside to in and out cath pt. EDT orientee at bedside

## 2020-02-29 ENCOUNTER — Encounter: Payer: Self-pay | Admitting: Physical Therapy

## 2020-02-29 ENCOUNTER — Ambulatory Visit: Payer: Medicare HMO | Admitting: Physical Therapy

## 2020-02-29 DIAGNOSIS — M6281 Muscle weakness (generalized): Secondary | ICD-10-CM

## 2020-02-29 DIAGNOSIS — M25661 Stiffness of right knee, not elsewhere classified: Secondary | ICD-10-CM

## 2020-02-29 DIAGNOSIS — R262 Difficulty in walking, not elsewhere classified: Secondary | ICD-10-CM

## 2020-02-29 NOTE — Therapy (Signed)
[facility] [address] [place], [place], [address] Phone: [phone]   Fax:  [phone]  Physical Therapy Treatment  Patient Details  Name: Cynthia Walker MRN: [id number] Date of Birth: [birth year] Referring Provider (PT): Feldpausch   Encounter Date: 02/29/2020  PT End of Session - 02/29/20 1532    Visit Number  4    Number of Visits  24    Date for PT Re-Evaluation  05/03/20    PT Start Time  1100    PT Stop Time  1155    PT Time Calculation (min)  55 min    Equipment Utilized During Treatment  Gait belt    Activity Tolerance  Patient tolerated treatment well    Behavior During Therapy  [facility] for tasks assessed/performed   Highly distractable      Past Medical History:  Diagnosis Date  . Breast cancer [facility]) 2009   Right breast CA with lumpectomy, radiation and chemo tx's.  . Dementia ([place])   . Hypertension   . Personal history of chemotherapy   . Personal history of radiation therapy     Past Surgical History:  Procedure Laterality Date  . BREAST BIOPSY Right 03/15/2008   invasive mammary carcinoma  . BREAST LUMPECTOMY Right 04/14/2008   Pathology revealed a 1.1 cm grade I invasive ductal carcinoma. Margins were negative.   . COLONOSCOPY WITH PROPOFOL N/A 07/10/2015   Procedure: COLONOSCOPY WITH PROPOFOL;  Surgeon: Hulen Luster, MD;  Location: [facility] ENDOSCOPY;  Service: Gastroenterology;  Laterality: N/A;  . INTRAMEDULLARY (IM) NAIL INTERTROCHANTERIC Left 06/27/2019   Procedure: INTRAMEDULLARY (IM) NAIL INTERTROCHANTRIC;  Surgeon: Creig Hines, MD;  Location: [facility] ORS;  Service: Orthopedics;  Laterality: Left;  . TONSILLECTOMY     age of 85 years old    There were no vitals filed for this visit.  Subjective Assessment - 02/29/20 1530    Subjective  Patient presents to clinic in good spirits after spending yesterday in the ED when she was unresponsive to her spouse and home health aide in the morning. Spouse  notes that they were able to go to a jeweler to have patient's rings removed and her swelling in her hands is coming down some. Patient and spouse continue to work on hamstring/knee extension stretches at home.    Patient is accompained by:  Family member   husband, [name]   Pertinent History  Femur fracture, left (CMS-HCC) 06/26/2019; Patient was ambulatory without AD prior to the fall that resulted in fx. Patient's spouse notes that she was capable of walking ~0.5 miles prior to the fall. Patient's spouse reports that she can at present stand for up to 10 min, but not consistently and not without assistance. While working with HHPT patient was able to ambulate with a RW for ~ 200 feet with rest breaks throughout.    Limitations  Lifting;Standing;Walking;House hold activities    How long can you sit comfortably?  Unlimited    How long can you stand comfortably?  5-10 min with support    How long can you walk comfortably?  < 200 feet    Patient Stated Goals  2/2 to cognitive state, patient did not state speicifc goals. Spouse would like to see her ambulatory within the house.    Currently in Pain?  No/denies       TREATMENT  Neuromuscular Re-education: // bars: Standing posture/balance, BUE support, CGA, x 90sec, max VCs for erect posture Standing lateral weight shifts, BUE support,  CGA, x10 each side with VCs for orientation to task STS, BUE support, CGA, 2x3 with max VCs for sequencing and participation   Therapeutic Exercise: Seated hamstring stretch, BLE, 2 min x3 each leg for improved ability to stand  Patient educated throughout session on appropriate technique and form using multi-modal cueing, HEP, and activity modification. Patient articulated understanding and returned demonstration.  Patient Response to interventions: Patient states, "I'm done."  ASSESSMENT Patient presents to clinic with excellent motivation to participate in therapy. Patient demonstrates deficits incognition,  posture, BLE strength, gait, balance, and function. Patient continues to require max encouragement and orientation to task during today's session and responded positively to active interventions. Patient will benefit from continued skilled therapeutic intervention to address remaining deficits in cognition, posture, BLE strength, gait, balance, and function in order to increase function and improve overall QOL.     PT Long Term Goals - 02/09/20 1415      PT LONG TERM GOAL #1   Title  Patient will be modified independent with spousal support with HEP for improved strength and balance in order to decrease fall risk and dependence at home and in the community.    Baseline  IE: not demonstrated    Time  12    Period  Weeks    Status  New    Target Date  05/03/20      PT LONG TERM GOAL #2   Title  Patient will demonstrate improved function as evidenced by a score of 44 on FOTO measure for full participation in activities at home and in the community.    Baseline  IE: 16    Time  12    Period  Weeks    Status  New    Target Date  05/03/20      PT LONG TERM GOAL #3   Title  Patient will perform STS with BUE support with Modified IND/SBA with VCs for sequencing and safety in order to participate more fully in basic ADLs.    Baseline  IE: Mod/MinA, MAX VCs and TCs for sequencing/safety    Time  12    Period  Weeks    Status  New    Target Date  05/03/20      PT LONG TERM GOAL #4   Title  Patient will demonstrate improved B knee extension ROM as evidenced by lacking < 10 degrees of B knee extension PROM/AROM in order to achieve standing posture with greater independence.    Baseline  IE: B lacking 15-20 degrees of knee extension    Time  12    Period  Weeks    Status  New    Target Date  05/03/20      PT LONG TERM GOAL #5   Title  Patient will demonstrate improved static standing balance as evidenced by ability to maintain standing posture with CGA/SBA, SUE support for 4 minutes in  order to participate in basic ADLs.    Baseline  IE: 90 sec, MinA, BUE support    Time  12    Period  Weeks    Status  New    Target Date  05/03/20            Plan - 02/29/20 1532    Clinical Impression Statement  Patient presents to clinic with excellent motivation to participate in therapy. Patient demonstrates deficits incognition, posture, BLE strength, gait, balance, and function. Patient continues to require max encouragement and orientation to task during today's session  and responded positively to active interventions. Patient will benefit from continued skilled therapeutic intervention to address remaining deficits in cognition, posture, BLE strength, gait, balance, and function in order to increase function and improve overall QOL.    Personal Factors and Comorbidities  Age;Education;Comorbidity 3+;Past/Current Experience;Time since onset of injury/illness/exacerbation;Fitness;Transportation;Other   Cognitive State   Comorbidities  anemia, breast cancer, chronic constipation, diverticulosis, hypertension, hyperlipidemia, osteoporosis    Examination-Activity Limitations  Bathing;Dressing;Transfers;Hygiene/Grooming;Bed Mobility;Squat;Lift;Bend;Locomotion Level;Stairs;Reach Overhead;Stand;Toileting;Carry    Examination-Participation Restrictions  Church;Interpersonal Relationship;Personal Finances;Yard Work;Cleaning;Laundry;Community Activity;Medication Management;Shop;Driving;Meal Prep    Stability/Clinical Decision Making  Evolving/Moderate complexity    Rehab Potential  Fair    PT Frequency  2x / week    PT Duration  12 weeks    PT Treatment/Interventions  Moist Heat;DME Instruction;Therapeutic activities;Functional mobility training;Stair training;Gait training;Therapeutic exercise;Balance training;Neuromuscular re-education;Cognitive remediation;Orthotic Fit/Training;Patient/family education;Manual techniques;Scar mobilization;Passive range of motion;Energy conservation;Taping     PT Next Visit Plan  Hamstring extensibility, standing tolerance with weight shifts    PT Home Exercise Plan  hamstring stretch    Consulted and Agree with Plan of Care  Patient;Family member/caregiver    Family Member Consulted  Dangela How, husband       Patient will benefit from skilled therapeutic intervention in order to improve the following deficits and impairments:  Abnormal gait, Decreased balance, Decreased endurance, Decreased mobility, Difficulty walking, Hypomobility, Postural dysfunction, Improper body mechanics, Increased edema, Decreased range of motion, Decreased cognition, Decreased activity tolerance, Decreased coordination, Decreased safety awareness, Decreased strength, Impaired flexibility  Visit Diagnosis: Decreased range of motion (ROM) of both knees  Muscle weakness (generalized)  Difficulty in walking, not elsewhere classified     Problem List Patient Active Problem List   Diagnosis Date Noted  . Encounter for hospice care discussion   . MRSA bacteremia 02/17/2020  . Streptococcal bacteremia 02/17/2020  . Bacteremia 02/17/2020  . History of breast cancer 02/17/2020  . Goals of care, counseling/discussion   . Palliative care by specialist   . DNR (do not resuscitate) discussion   . Breast pain, right 01/11/2020  . Femoral neck fracture ([place]) 06/27/2019  . Femur fracture, left ([place]) 06/26/2019  . Right lower lobe pulmonary nodule 05/04/2019  . Syncope 09/27/2017  . Atypical chest pain 09/03/2017  . Essential hypertension 09/03/2017  . Short-term memory loss 09/03/2017  . Osteoporosis 04/02/2016  . Breast cancer, right [facility]) 04/06/2008   Myles Gip PT, DPT [phone] 02/29/2020, 3:48 PM  [place] [facility] [address] [place], [place], [address] Phone: [phone]   Fax:  [phone]  Name: Cynthia Walker MRN: [id number] Date of Birth: January 06, 1935

## 2020-03-01 LAB — URINE CULTURE: Culture: NO GROWTH

## 2020-03-02 ENCOUNTER — Ambulatory Visit: Payer: Medicare HMO | Admitting: Physical Therapy

## 2020-03-02 ENCOUNTER — Encounter: Payer: Self-pay | Admitting: Physical Therapy

## 2020-03-02 ENCOUNTER — Other Ambulatory Visit: Payer: Self-pay

## 2020-03-02 DIAGNOSIS — M25661 Stiffness of right knee, not elsewhere classified: Secondary | ICD-10-CM | POA: Diagnosis not present

## 2020-03-02 DIAGNOSIS — M6281 Muscle weakness (generalized): Secondary | ICD-10-CM

## 2020-03-02 DIAGNOSIS — R262 Difficulty in walking, not elsewhere classified: Secondary | ICD-10-CM

## 2020-03-02 NOTE — Therapy (Signed)
[facility] [address] [place], [place], [address] Phone: [phone]   Fax:  [phone]  Physical Therapy Treatment  Patient Details  Name: Cynthia Walker MRN: [id number] Date of Birth: [birth year] Referring Provider (PT): Feldpausch   Encounter Date: 03/02/2020  PT End of Session - 03/02/20 1636    Visit Number  5    Number of Visits  24    Date for PT Re-Evaluation  05/03/20    PT Start Time  1312    PT Stop Time  1358    PT Time Calculation (min)  46 min    Equipment Utilized During Treatment  Gait belt    Activity Tolerance  Patient tolerated treatment well    Behavior During Therapy  [facility] for tasks assessed/performed   Highly distractable      Past Medical History:  Diagnosis Date  . Breast cancer [facility]) 2009   Right breast CA with lumpectomy, radiation and chemo tx's.  . Dementia ([place])   . Hypertension   . Personal history of chemotherapy   . Personal history of radiation therapy     Past Surgical History:  Procedure Laterality Date  . BREAST BIOPSY Right 03/15/2008   invasive mammary carcinoma  . BREAST LUMPECTOMY Right 04/14/2008   Pathology revealed a 1.1 cm grade I invasive ductal carcinoma. Margins were negative.   . COLONOSCOPY WITH PROPOFOL N/A 07/10/2015   Procedure: COLONOSCOPY WITH PROPOFOL;  Surgeon: Hulen Luster, MD;  Location: [facility] ENDOSCOPY;  Service: Gastroenterology;  Laterality: N/A;  . INTRAMEDULLARY (IM) NAIL INTERTROCHANTERIC Left 06/27/2019   Procedure: INTRAMEDULLARY (IM) NAIL INTERTROCHANTRIC;  Surgeon: Creig Hines, MD;  Location: [facility] ORS;  Service: Orthopedics;  Laterality: Left;  . TONSILLECTOMY     age of 85 years old    There were no vitals filed for this visit.  Subjective Assessment - 03/02/20 1634    Subjective  Patient presents to clinic quite alert and with her spouse about 15 minutes late for the appointment. Patient and spouse deny any significant changes/concerns since  last session. Spouse notes that patient will sometimes hold on to grab bar with one hand to assist with dressing, which makes him nervous but he adds that she has not been unsteady doing this.    Patient is accompained by:  Family member   husband, Cynthia Walker   Pertinent History  Femur fracture, left (CMS-HCC) 06/26/2019; Patient was ambulatory without AD prior to the fall that resulted in fx. Patient's spouse notes that she was capable of walking ~0.5 miles prior to the fall. Patient's spouse reports that she can at present stand for up to 10 min, but not consistently and not without assistance. While working with HHPT patient was able to ambulate with a RW for ~ 200 feet with rest breaks throughout.    Limitations  Lifting;Standing;Walking;House hold activities    How long can you sit comfortably?  Unlimited    How long can you stand comfortably?  5-10 min with support    How long can you walk comfortably?  < 200 feet    Patient Stated Goals  2/2 to cognitive state, patient did not state speicifc goals. Spouse would like to see her ambulatory within the house.    Currently in Pain?  No/denies       TREATMENT  Neuromuscular Re-education: // bars: Standing posture/balance, BUE support, CGA, 3 x 90sec, max VCs for erect posture Standing lateral weight shifts, BUE support, CGA, x15 each side  with VCs for orientation to task Standing mini marches, BUE support, CGA/MinA, x10 each side with VCs and TCs for participation Standing posture/balance, faded UE support, CGA/MinA, 2x30 sec STS, BUE support, CGA, 2x3 with max VCs for sequencing and participation   Therapeutic Exercise: Seated hamstring stretch, BLE, 2 min x2 each leg for improved ability to stand  Patient educated throughout session on appropriate technique and form using multi-modal cueing, HEP, and activity modification. Patient articulated understanding and returned demonstration.  Patient Response to interventions: Patient states,  "You've made me too tired."  ASSESSMENT Patient presents to clinic with excellent motivation to participate in therapy. Patient demonstrates deficits incognition, posture, BLE strength, gait, balance, and function. Patient much more alert and amenable to standing with weight shifts and attempts to SLS activities despite still requiring consistent reorientation to task and max encouragement for continued participation during today's session and responded positively to active interventions. Patient will benefit from continued skilled therapeutic intervention to address remaining deficits in cognition, posture, BLE strength, gait, balance, and function in order to increase function and improve overall QOL.   PT Long Term Goals - 02/09/20 1415      PT LONG TERM GOAL #1   Title  Patient will be modified independent with spousal support with HEP for improved strength and balance in order to decrease fall risk and dependence at home and in the community.    Baseline  IE: not demonstrated    Time  12    Period  Weeks    Status  New    Target Date  05/03/20      PT LONG TERM GOAL #2   Title  Patient will demonstrate improved function as evidenced by a score of 44 on FOTO measure for full participation in activities at home and in the community.    Baseline  IE: 16    Time  12    Period  Weeks    Status  New    Target Date  05/03/20      PT LONG TERM GOAL #3   Title  Patient will perform STS with BUE support with Modified IND/SBA with VCs for sequencing and safety in order to participate more fully in basic ADLs.    Baseline  IE: Mod/MinA, MAX VCs and TCs for sequencing/safety    Time  12    Period  Weeks    Status  New    Target Date  05/03/20      PT LONG TERM GOAL #4   Title  Patient will demonstrate improved B knee extension ROM as evidenced by lacking < 10 degrees of B knee extension PROM/AROM in order to achieve standing posture with greater independence.    Baseline  IE: B lacking  15-20 degrees of knee extension    Time  12    Period  Weeks    Status  New    Target Date  05/03/20      PT LONG TERM GOAL #5   Title  Patient will demonstrate improved static standing balance as evidenced by ability to maintain standing posture with CGA/SBA, SUE support for 4 minutes in order to participate in basic ADLs.    Baseline  IE: 90 sec, MinA, BUE support    Time  12    Period  Weeks    Status  New    Target Date  05/03/20            Plan - 03/02/20 1636  Clinical Impression Statement  Patient presents to clinic with excellent motivation to participate in therapy. Patient demonstrates deficits incognition, posture, BLE strength, gait, balance, and function. Patient much more alert and amenable to standing with weight shifts and attempts to SLS activities despite still requiring consistent reorientation to task and max encouragement for continued participation during today's session and responded positively to active interventions. Patient will benefit from continued skilled therapeutic intervention to address remaining deficits in cognition, posture, BLE strength, gait, balance, and function in order to increase function and improve overall QOL.    Personal Factors and Comorbidities  Age;Education;Comorbidity 3+;Past/Current Experience;Time since onset of injury/illness/exacerbation;Fitness;Transportation;Other   Cognitive State   Comorbidities  anemia, breast cancer, chronic constipation, diverticulosis, hypertension, hyperlipidemia, osteoporosis    Examination-Activity Limitations  Bathing;Dressing;Transfers;Hygiene/Grooming;Bed Mobility;Squat;Lift;Bend;Locomotion Level;Stairs;Reach Overhead;Stand;Toileting;Carry    Examination-Participation Restrictions  Church;Interpersonal Relationship;Personal Finances;Yard Work;Cleaning;Laundry;Community Activity;Medication Management;Shop;Driving;Meal Prep    Stability/Clinical Decision Making  Evolving/Moderate complexity    Rehab  Potential  Fair    PT Frequency  2x / week    PT Duration  12 weeks    PT Treatment/Interventions  Moist Heat;DME Instruction;Therapeutic activities;Functional mobility training;Stair training;Gait training;Therapeutic exercise;Balance training;Neuromuscular re-education;Cognitive remediation;Orthotic Fit/Training;Patient/family education;Manual techniques;Scar mobilization;Passive range of motion;Energy conservation;Taping    PT Next Visit Plan  Hamstring extensibility, standing tolerance with weight shifts    PT Home Exercise Plan  hamstring stretch    Consulted and Agree with Plan of Care  Patient;Family member/caregiver    Family Member Consulted  Cynthia Walker, husband       Patient will benefit from skilled therapeutic intervention in order to improve the following deficits and impairments:  Abnormal gait, Decreased balance, Decreased endurance, Decreased mobility, Difficulty walking, Hypomobility, Postural dysfunction, Improper body mechanics, Increased edema, Decreased range of motion, Decreased cognition, Decreased activity tolerance, Decreased coordination, Decreased safety awareness, Decreased strength, Impaired flexibility  Visit Diagnosis: Decreased range of motion (ROM) of both knees  Muscle weakness (generalized)  Difficulty in walking, not elsewhere classified     Problem List Patient Active Problem List   Diagnosis Date Noted  . Encounter for hospice care discussion   . MRSA bacteremia 02/17/2020  . Streptococcal bacteremia 02/17/2020  . Bacteremia 02/17/2020  . History of breast cancer 02/17/2020  . Goals of care, counseling/discussion   . Palliative care by specialist   . DNR (do not resuscitate) discussion   . Breast pain, right 01/11/2020  . Femoral neck fracture ([place]) 06/27/2019  . Femur fracture, left ([place]) 06/26/2019  . Right lower lobe pulmonary nodule 05/04/2019  . Syncope 09/27/2017  . Atypical chest pain 09/03/2017  . Essential hypertension  09/03/2017  . Short-term memory loss 09/03/2017  . Osteoporosis 04/02/2016  . Breast cancer, right [facility]) 04/06/2008   Myles Gip PT, DPT [phone] 03/02/2020, 4:41 PM  [place] [facility] [address] [place], [place], [address] Phone: [phone]   Fax:  [phone]  Name: Cynthia Walker MRN: [id number] Date of Birth: Jul 15, 1935

## 2020-03-07 ENCOUNTER — Other Ambulatory Visit: Payer: Medicare HMO | Admitting: Primary Care

## 2020-03-07 ENCOUNTER — Encounter: Payer: Self-pay | Admitting: Physical Therapy

## 2020-03-07 ENCOUNTER — Ambulatory Visit: Payer: Medicare HMO | Attending: Family Medicine | Admitting: Physical Therapy

## 2020-03-07 ENCOUNTER — Other Ambulatory Visit: Payer: Self-pay

## 2020-03-07 DIAGNOSIS — M25662 Stiffness of left knee, not elsewhere classified: Secondary | ICD-10-CM | POA: Diagnosis present

## 2020-03-07 DIAGNOSIS — Z515 Encounter for palliative care: Secondary | ICD-10-CM

## 2020-03-07 DIAGNOSIS — R262 Difficulty in walking, not elsewhere classified: Secondary | ICD-10-CM | POA: Insufficient documentation

## 2020-03-07 DIAGNOSIS — M6281 Muscle weakness (generalized): Secondary | ICD-10-CM | POA: Insufficient documentation

## 2020-03-07 DIAGNOSIS — M25661 Stiffness of right knee, not elsewhere classified: Secondary | ICD-10-CM | POA: Diagnosis present

## 2020-03-07 NOTE — Progress Notes (Signed)
[place] Consult Note Telephone: [phone]  Fax: [phone]  PATIENT NAME: Cynthia Walker [id number] [phone] (home)  DOB: 1935/06/14 MRN: [id number]  PRIMARY CARE PROVIDER:    Sofie Hartigan, MD,  [facility] [place] [address] [phone]  REFERRING PROVIDER:   Sofie Hartigan, [place],  [place] [address] [phone]  RESPONSIBLE PARTY:   Extended Emergency Contact Information Primary Emergency Contact: Murty,Richard E Address: [address] [place] [address] Johnnette [place]itter of [place] Phone: [phone] Mobile Phone: [phone] Relation: Spouse Secondary Emergency Contact: Triola,Janet Address: [address]          [place], [place] [address] Johnnette Litter of [place] Phone: [phone] Relation: Daughter   I met with patient and family in the home.   ASSESSMENT AND RECOMMENDATIONS 1. Advance Care Planning/Goals of Care: Goals include to maximize quality of life and symptom management. Explained palliative care and hospice service to daughter. Discussed advance care planning, left the MOST form for his review. Discussed usefulness of translating legal document into medical order.  2. Symptom Management:   Weakness; Outpatient therapy at [facility]. Non -ambulatory. Pivot transfers. Several falls in past 60 days.   Mood: Patient was in bathroom, not coming out for interview, sobbing. Has not had much or consistent sertraline, endorsing only several doses and feels it may be making her sundowning worse. This is unlikely as she's only had a few doses, but possible.  PCP ordered seroquel 25 mg at 4 pm but [facility] does not compound. I have called this to [place], and let pt husband know after the interview.We also discussed some of her fears and frustration of not being able to reason.  Appetite: Poor intake, albumin 2.7; Albumin 3.7 in March  2021.  Instructed to add boost or ensure. Husband states she eats whole meals although slowly, and generally 2 meals / day. Education provided to give snacks often due to poor nutrition.Education to introduce additional nutrition eg boost.  Sleep: Sleeps at hs well but has some sundowning agitation in the afternoon.   Agitation:   States some resistance at hs. Always [facility] caregivers. Has 6 hours of caregiving in the am and pm. Had seroquel ordered but has not picked up.   3. Family /Caregiver/Community Supports:  Lives with husband in their own home. Has paid caregivers. Spoke with husband later and he is coping fair, but needs additional support with caregiving.  4. Cognitive / Functional decline:  Has standard walker. We discussed using for stability.  Has gait belt also for stability. Has medical alert.  Reviewed use.   5. Follow up Palliative Care Visit: Palliative care will continue to follow for goals of care clarification and symptom management. Return 5 weeks or prn.  I spent 110  minutes providing this consultation,  from 1300 to 1450. More than 50% of the time in this consultation was spent coordinating communication.   HISTORY OF PRESENT ILLNESS:  Cynthia Walker is a 85 y.o. year old female with multiple medical problems including protein calorie malnutrition, weakness, dementia with behavioral disturbances. Palliative Care was asked to follow this patient by consultation request of Feldpausch, Chrissie Noa, MD to help address advance care planning and goals of care. This is the initial visit.  CODE STATUS: TBD  PPS: 30% HOSPICE ELIGIBILITY/DIAGNOSIS: no  PAST MEDICAL HISTORY:  Past Medical History:  Diagnosis Date  . Breast cancer [facility]) 2009   Right breast CA with lumpectomy, radiation and chemo tx's.  . Dementia ([place])   . Hypertension   . Personal history of chemotherapy   . Personal history of radiation therapy     SOCIAL HX:  Social History   Tobacco Use  .  Smoking status: Never Smoker  . Smokeless tobacco: Never Used  Substance Use Topics  . Alcohol use: No    ALLERGIES:  Allergies  Allergen Reactions  . Flu Virus Vaccine     Other reaction(s): Other (See Comments) Stomach cramps  . Latex Swelling  . Other     Other reaction(s): Unknown Allergy to eggs  . Procaine Other (See Comments)    weakness  . Soy Isoflavones     Other reaction(s): Other (See Comments) Soy causes legs to feel like rubber  . Penicillins Rash    Has patient had a PCN reaction causing immediate rash, facial/tongue/throat swelling, SOB or lightheadedness with hypotension: No Has patient had a PCN reaction causing severe rash involving mucus membranes or skin necrosis: No Has patient had a PCN reaction that required hospitalization: No Has patient had a PCN reaction occurring within the last 10 years: No If all of the above answers are "NO", then may proceed with Cephalosporin use.      PERTINENT MEDICATIONS:  Outpatient Encounter Medications as of 03/07/2020  Medication Sig  . donepezil (ARICEPT) 5 MG tablet Take 5 mg by mouth daily.  . Multiple Vitamin (MULTI-VITAMINS) TABS Take by mouth.  . QUEtiapine (SEROQUEL) 25 MG tablet Take 25 mg by mouth daily. At 4 pm. Compounded into suspension  . sertraline (ZOLOFT) 50 MG tablet Take 75 mg by mouth daily.   . furosemide (LASIX) 20 MG tablet Take 20 mg by mouth daily.   No facility-administered encounter medications on file as of 03/07/2020.    PHYSICAL EXAM / ROS:   Current and past weights: 145 lbs per hospital chart in May 2021,133 lbs in July 2020. UE edema. General: NAD, frail appearing, thin Cardiovascular: no chest pain reported, sl bil pedal edema  Pulmonary: no cough, no increased SOB, room air Abdomen: appetite fair, denies constipation, continent of bowel GU: denies dysuria, continent of urine MSK:  + joint and ROM abnormalities,non-  Ambulatory, can pivot transfer Skin: no rashes or wounds  reported Neurological: Weakness, dementia with behavioral disturbances.  Jason Coop, NP [facility]  COVID-19 PATIENT SCREENING TOOL  Person answering questions: ___________Mr Miller______ _____   1.  Is the patient or any family member in the home showing any signs or symptoms regarding respiratory infection?               Person with Symptom- __________NA_________________  a. Fever                                                                          Yes___ No___          ___________________  b. Shortness of breath  Yes___ No___          ___________________ c. Cough/congestion                                       Yes___  No___         ___________________ d. Body aches/pains                                                         Yes___ No___        ____________________ e. Gastrointestinal symptoms (diarrhea, nausea)           Yes___ No___        ____________________  2. Within the past 14 days, has anyone living in the home had any contact with someone with or under investigation for COVID-19?    Yes___ No_X_   Person __________________   

## 2020-03-07 NOTE — Therapy (Signed)
[facility] [address]. [place], [place], [address] Phone: [phone]   Fax:  [phone]  Physical Therapy Treatment  Patient Details  Name: Cynthia Walker MRN: [id number] Date of Birth: 1935/01/13 Referring Provider (PT): Feldpausch   Encounter Date: 03/07/2020  PT End of Session - 03/07/20 1252    Visit Number  6    Number of Visits  24    Date for PT Re-Evaluation  05/03/20    PT Start Time  1108    PT Stop Time  1152    PT Time Calculation (min)  44 min    Equipment Utilized During Treatment  Gait belt    Activity Tolerance  Patient tolerated treatment well    Behavior During Therapy  [facility] for tasks assessed/performed   Highly distractable      Past Medical History:  Diagnosis Date  . Breast cancer [facility]) 2009   Right breast CA with lumpectomy, radiation and chemo tx's.  . Dementia ([place])   . Hypertension   . Personal history of chemotherapy   . Personal history of radiation therapy     Past Surgical History:  Procedure Laterality Date  . BREAST BIOPSY Right 03/15/2008   invasive mammary carcinoma  . BREAST LUMPECTOMY Right 04/14/2008   Pathology revealed a 1.1 cm grade I invasive ductal carcinoma. Margins were negative.   . COLONOSCOPY WITH PROPOFOL N/A 07/10/2015   Procedure: COLONOSCOPY WITH PROPOFOL;  Surgeon: Hulen Luster, MD;  Location: [facility] ENDOSCOPY;  Service: Gastroenterology;  Laterality: N/A;  . INTRAMEDULLARY (IM) NAIL INTERTROCHANTERIC Left 06/27/2019   Procedure: INTRAMEDULLARY (IM) NAIL INTERTROCHANTRIC;  Surgeon: Creig Hines, MD;  Location: [facility] ORS;  Service: Orthopedics;  Laterality: Left;  . TONSILLECTOMY     age of 85 years old    There were no vitals filed for this visit.  Subjective Assessment - 03/07/20 1251    Subjective  Patient presents to clinic about 10 minutes late, but amenable to participate in therapy. Patient requiring frequent reorientation to task today and spouse noting  some increased lack in attention span today. No other concerns or significant changes. Family continues to work on knee extension at home.    Patient is accompained by:  Family member   husband, [name]   Pertinent History  Femur fracture, left (CMS-HCC) 06/26/2019; Patient was ambulatory without AD prior to the fall that resulted in fx. Patient's spouse notes that she was capable of walking ~0.5 miles prior to the fall. Patient's spouse reports that she can at present stand for up to 10 min, but not consistently and not without assistance. While working with HHPT patient was able to ambulate with a RW for ~ 200 feet with rest breaks throughout.    Limitations  Lifting;Standing;Walking;House hold activities    How long can you sit comfortably?  Unlimited    How long can you stand comfortably?  5-10 min with support    How long can you walk comfortably?  < 200 feet    Patient Stated Goals  2/2 to cognitive state, patient did not state speicifc goals. Spouse would like to see her ambulatory within the house.    Currently in Pain?  No/denies       TREATMENT  Neuromuscular Re-education: // bars: Standing posture/balance, BUE support, CGA, 2 x 2 min, max VCs for erect posture Standing lateral weight shifts, BUE support, CGA, x30 each side with VCs for orientation to task Standing posture/balance, faded UE support, CGA/MinA, 2x30  sec STS, BUE support, CGA, 2x3 with max VCs for sequencing and participation  Therapeutic Exercise: Seated hamstring stretch, BLE, 2 min x2 each leg for improved ability to stand  Patient educated throughout session on appropriate technique and form using multi-modal cueing, HEP, and activity modification. Patient articulated understanding and returned demonstration.  Patient Response to interventions: Patient states, "We're done here."  ASSESSMENT Patient presents to clinic with excellent motivation to participate in therapy. Patient demonstrates deficits incognition,  posture, BLE strength, gait, balance, and function. Patient with decreased interest in participation today and requiring more seated rest between bouts of activity during today's session and responded positively to active interventions, but did have loss of bladder at some point during the session. Patient soiled her own w/c and clothing; a disposable chuck pad was provided for the w/c and the session was limited to decrease risk of skin irritation/infection. Patient will benefit from continued skilled therapeutic intervention to address remaining deficits in cognition, posture, BLE strength, gait, balance, and function in order to increase function and improve overall QOL.    PT Long Term Goals - 02/09/20 1415      PT LONG TERM GOAL #1   Title  Patient will be modified independent with spousal support with HEP for improved strength and balance in order to decrease fall risk and dependence at home and in the community.    Baseline  IE: not demonstrated    Time  12    Period  Weeks    Status  New    Target Date  05/03/20      PT LONG TERM GOAL #2   Title  Patient will demonstrate improved function as evidenced by a score of 44 on FOTO measure for full participation in activities at home and in the community.    Baseline  IE: 16    Time  12    Period  Weeks    Status  New    Target Date  05/03/20      PT LONG TERM GOAL #3   Title  Patient will perform STS with BUE support with Modified IND/SBA with VCs for sequencing and safety in order to participate more fully in basic ADLs.    Baseline  IE: Mod/MinA, MAX VCs and TCs for sequencing/safety    Time  12    Period  Weeks    Status  New    Target Date  05/03/20      PT LONG TERM GOAL #4   Title  Patient will demonstrate improved B knee extension ROM as evidenced by lacking < 10 degrees of B knee extension PROM/AROM in order to achieve standing posture with greater independence.    Baseline  IE: B lacking 15-20 degrees of knee extension     Time  12    Period  Weeks    Status  New    Target Date  05/03/20      PT LONG TERM GOAL #5   Title  Patient will demonstrate improved static standing balance as evidenced by ability to maintain standing posture with CGA/SBA, SUE support for 4 minutes in order to participate in basic ADLs.    Baseline  IE: 90 sec, MinA, BUE support    Time  12    Period  Weeks    Status  New    Target Date  05/03/20            Plan - 03/07/20 1253    Clinical Impression Statement  Patient presents to clinic with excellent motivation to participate in therapy. Patient demonstrates deficits incognition, posture, BLE strength, gait, balance, and function. Patient with decreased interest in participation today and requiring more seated rest between bouts of activity during today's session and responded positively to active interventions, but did have loss of bladder at some point during the session. Patient soiled her own w/c and clothing; a disposable chuck pad was provided for the w/c and the session was limited to decrease risk of skin irritation/infection. Patient will benefit from continued skilled therapeutic intervention to address remaining deficits in cognition, posture, BLE strength, gait, balance, and function in order to increase function and improve overall QOL.    Personal Factors and Comorbidities  Age;Education;Comorbidity 3+;Past/Current Experience;Time since onset of injury/illness/exacerbation;Fitness;Transportation;Other   Cognitive State   Comorbidities  anemia, breast cancer, chronic constipation, diverticulosis, hypertension, hyperlipidemia, osteoporosis    Examination-Activity Limitations  Bathing;Dressing;Transfers;Hygiene/Grooming;Bed Mobility;Squat;Lift;Bend;Locomotion Level;Stairs;Reach Overhead;Stand;Toileting;Carry    Examination-Participation Restrictions  Church;Interpersonal Relationship;Personal Finances;Yard Work;Cleaning;Laundry;Community Activity;Medication  Management;Shop;Driving;Meal Prep    Stability/Clinical Decision Making  Evolving/Moderate complexity    Rehab Potential  Fair    PT Frequency  2x / week    PT Duration  12 weeks    PT Treatment/Interventions  Moist Heat;DME Instruction;Therapeutic activities;Functional mobility training;Stair training;Gait training;Therapeutic exercise;Balance training;Neuromuscular re-education;Cognitive remediation;Orthotic Fit/Training;Patient/family education;Manual techniques;Scar mobilization;Passive range of motion;Energy conservation;Taping    PT Next Visit Plan  Hamstring extensibility, standing tolerance with weight shifts    PT Home Exercise Plan  hamstring stretch    Consulted and Agree with Plan of Care  Patient;Family member/caregiver    Family Member Consulted  Koya Hunger, husband       Patient will benefit from skilled therapeutic intervention in order to improve the following deficits and impairments:  Abnormal gait, Decreased balance, Decreased endurance, Decreased mobility, Difficulty walking, Hypomobility, Postural dysfunction, Improper body mechanics, Increased edema, Decreased range of motion, Decreased cognition, Decreased activity tolerance, Decreased coordination, Decreased safety awareness, Decreased strength, Impaired flexibility  Visit Diagnosis: Decreased range of motion (ROM) of both knees  Muscle weakness (generalized)  Difficulty in walking, not elsewhere classified     Problem List Patient Active Problem List   Diagnosis Date Noted  . Encounter for hospice care discussion   . MRSA bacteremia 02/17/2020  . Streptococcal bacteremia 02/17/2020  . Bacteremia 02/17/2020  . History of breast cancer 02/17/2020  . Goals of care, counseling/discussion   . Palliative care by specialist   . DNR (do not resuscitate) discussion   . Breast pain, right 01/11/2020  . Femoral neck fracture ([place]) 06/27/2019  . Femur fracture, left ([place]) 06/26/2019  . Right lower lobe  pulmonary nodule 05/04/2019  . Syncope 09/27/2017  . Atypical chest pain 09/03/2017  . Essential hypertension 09/03/2017  . Short-term memory loss 09/03/2017  . Osteoporosis 04/02/2016  . Breast cancer, right [facility]) 04/06/2008   Myles Gip PT, DPT [id number]  03/07/2020, 12:59 PM  [place] [facility] [address] [place], [place], [address] Phone: [phone]   Fax:  [phone]  Name: Cynthia Walker MRN: [id number] Date of Birth: 11-Nov-1934

## 2020-03-09 ENCOUNTER — Other Ambulatory Visit: Payer: Self-pay

## 2020-03-09 ENCOUNTER — Telehealth: Payer: Self-pay | Admitting: Primary Care

## 2020-03-09 ENCOUNTER — Encounter: Payer: Self-pay | Admitting: Physical Therapy

## 2020-03-09 ENCOUNTER — Ambulatory Visit: Payer: Medicare HMO | Admitting: Physical Therapy

## 2020-03-09 DIAGNOSIS — M25661 Stiffness of right knee, not elsewhere classified: Secondary | ICD-10-CM

## 2020-03-09 DIAGNOSIS — M6281 Muscle weakness (generalized): Secondary | ICD-10-CM

## 2020-03-09 DIAGNOSIS — M25662 Stiffness of left knee, not elsewhere classified: Secondary | ICD-10-CM

## 2020-03-09 DIAGNOSIS — R262 Difficulty in walking, not elsewhere classified: Secondary | ICD-10-CM

## 2020-03-09 NOTE — Therapy (Signed)
[facility] [address] [place], [place], [address] Phone: [phone]   Fax:  [phone]  Physical Therapy Treatment  Patient Details  Name: Cynthia Walker MRN: [id number] Date of Birth: 11-Dec-1934 Referring Provider (PT): Feldpausch   Encounter Date: 03/09/2020  PT End of Session - 03/09/20 4496    Visit Number  7    Number of Visits  24    Date for PT Re-Evaluation  05/03/20    PT Start Time  1118    PT Stop Time  1202    PT Time Calculation (min)  44 min    Equipment Utilized During Treatment  Gait belt    Activity Tolerance  Patient limited by fatigue;Other (comment)   Patient limited by somnolence.   Behavior During Therapy  [facility] for tasks assessed/performed   Highly distractable      Past Medical History:  Diagnosis Date  . Breast cancer [facility]) 2009   Right breast CA with lumpectomy, radiation and chemo tx's.  . Dementia ([place])   . Hypertension   . Personal history of chemotherapy   . Personal history of radiation therapy     Past Surgical History:  Procedure Laterality Date  . BREAST BIOPSY Right 03/15/2008   invasive mammary carcinoma  . BREAST LUMPECTOMY Right 04/14/2008   Pathology revealed a 1.1 cm grade I invasive ductal carcinoma. Margins were negative.   . COLONOSCOPY WITH PROPOFOL N/A 07/10/2015   Procedure: COLONOSCOPY WITH PROPOFOL;  Surgeon: Hulen Luster, MD;  Location: [facility] ENDOSCOPY;  Service: Gastroenterology;  Laterality: N/A;  . INTRAMEDULLARY (IM) NAIL INTERTROCHANTERIC Left 06/27/2019   Procedure: INTRAMEDULLARY (IM) NAIL INTERTROCHANTRIC;  Surgeon: Creig Hines, MD;  Location: [facility] ORS;  Service: Orthopedics;  Laterality: Left;  . TONSILLECTOMY     age of 85 years old    There were no vitals filed for this visit.  Subjective Assessment - 03/09/20 1239    Subjective  Patient arrives to clinic 15-20 minutes late and is asleep on arrival. Patient's spouse states that per Palliative Care's  direction, the patient was given 1 mL of seroquel yesterday at 4P and has been "knocked out" since then. He also reports that while she can at times be resistant to transfers etc at home, she is not agitated nor combative. He finds her level of sedation to be more detrimental to her care than her occasional resistance.    Patient is accompained by:  Family member   husband, [name]   Pertinent History  Femur fracture, left (CMS-HCC) 06/26/2019; Patient was ambulatory without AD prior to the fall that resulted in fx. Patient's spouse notes that she was capable of walking ~0.5 miles prior to the fall. Patient's spouse reports that she can at present stand for up to 10 min, but not consistently and not without assistance. While working with HHPT patient was able to ambulate with a RW for ~ 200 feet with rest breaks throughout.    Limitations  Lifting;Standing;Walking;House hold activities    How long can you sit comfortably?  Unlimited    How long can you stand comfortably?  5-10 min with support    How long can you walk comfortably?  < 200 feet    Patient Stated Goals  2/2 to cognitive state, patient did not state speicifc goals. Spouse would like to see her ambulatory within the house.    Currently in Pain?  Other (Comment)   Patient grimacing with LUE movement.  TREATMENT  Neuromuscular Re-education: // bars: Standing posture/balance, faded UE support, CGA/MinA, 3x45 sec STS, BUE support, CGA, x5 with max VCs for sequencing and participation BUE PNF punch/pull pattern with max A from PT for decreased rigidity and improved ability to grasp // bars  Therapeutic Exercise: Seated hamstring stretch, BLE, 2 min x2 each leg for improved ability to stand  Patient educated throughout session on appropriate technique and form using multi-modal cueing, HEP, and activity modification. Patient articulated understanding and returned demonstration.  Patient Response to interventions: Patient states, "I  don't want to do this."  ASSESSMENT Patient presents to clinic with excellent motivation to participate in therapy. Patient demonstrates deficits incognition, posture, BLE strength, gait, balance, and function. Patient with very low arousal during today's session but responded positively to active interventions when alert to participate. Patient will benefit from continued skilled therapeutic intervention to address remaining deficits in cognition, posture, BLE strength, gait, balance, and function in order to increase function and improve overall QOL.   PT Long Term Goals - 02/09/20 1415      PT LONG TERM GOAL #1   Title  Patient will be modified independent with spousal support with HEP for improved strength and balance in order to decrease fall risk and dependence at home and in the community.    Baseline  IE: not demonstrated    Time  12    Period  Weeks    Status  New    Target Date  05/03/20      PT LONG TERM GOAL #2   Title  Patient will demonstrate improved function as evidenced by a score of 44 on FOTO measure for full participation in activities at home and in the community.    Baseline  IE: 16    Time  12    Period  Weeks    Status  New    Target Date  05/03/20      PT LONG TERM GOAL #3   Title  Patient will perform STS with BUE support with Modified IND/SBA with VCs for sequencing and safety in order to participate more fully in basic ADLs.    Baseline  IE: Mod/MinA, MAX VCs and TCs for sequencing/safety    Time  12    Period  Weeks    Status  New    Target Date  05/03/20      PT LONG TERM GOAL #4   Title  Patient will demonstrate improved B knee extension ROM as evidenced by lacking < 10 degrees of B knee extension PROM/AROM in order to achieve standing posture with greater independence.    Baseline  IE: B lacking 15-20 degrees of knee extension    Time  12    Period  Weeks    Status  New    Target Date  05/03/20      PT LONG TERM GOAL #5   Title  Patient will  demonstrate improved static standing balance as evidenced by ability to maintain standing posture with CGA/SBA, SUE support for 4 minutes in order to participate in basic ADLs.    Baseline  IE: 90 sec, MinA, BUE support    Time  12    Period  Weeks    Status  New    Target Date  05/03/20            Plan - 03/09/20 1243    Clinical Impression Statement  Patient presents to clinic with excellent motivation to participate in therapy. Patient  demonstrates deficits incognition, posture, BLE strength, gait, balance, and function. Patient with very low arousal during today's session but responded positively to active interventions when alert to participate. Patient will benefit from continued skilled therapeutic intervention to address remaining deficits in cognition, posture, BLE strength, gait, balance, and function in order to increase function and improve overall QOL.    Personal Factors and Comorbidities  Age;Education;Comorbidity 3+;Past/Current Experience;Time since onset of injury/illness/exacerbation;Fitness;Transportation;Other   Cognitive State   Comorbidities  anemia, breast cancer, chronic constipation, diverticulosis, hypertension, hyperlipidemia, osteoporosis    Examination-Activity Limitations  Bathing;Dressing;Transfers;Hygiene/Grooming;Bed Mobility;Squat;Lift;Bend;Locomotion Level;Stairs;Reach Overhead;Stand;Toileting;Carry    Examination-Participation Restrictions  Church;Interpersonal Relationship;Personal Finances;Yard Work;Cleaning;Laundry;Community Activity;Medication Management;Shop;Driving;Meal Prep    Stability/Clinical Decision Making  Evolving/Moderate complexity    Rehab Potential  Fair    PT Frequency  2x / week    PT Duration  12 weeks    PT Treatment/Interventions  Moist Heat;DME Instruction;Therapeutic activities;Functional mobility training;Stair training;Gait training;Therapeutic exercise;Balance training;Neuromuscular re-education;Cognitive remediation;Orthotic  Fit/Training;Patient/family education;Manual techniques;Scar mobilization;Passive range of motion;Energy conservation;Taping    PT Next Visit Plan  Hamstring extensibility, standing tolerance with weight shifts    PT Home Exercise Plan  hamstring stretch    Consulted and Agree with Plan of Care  Patient;Family member/caregiver    Family Member Consulted  Kayle Passarelli, husband       Patient will benefit from skilled therapeutic intervention in order to improve the following deficits and impairments:  Abnormal gait, Decreased balance, Decreased endurance, Decreased mobility, Difficulty walking, Hypomobility, Postural dysfunction, Improper body mechanics, Increased edema, Decreased range of motion, Decreased cognition, Decreased activity tolerance, Decreased coordination, Decreased safety awareness, Decreased strength, Impaired flexibility  Visit Diagnosis: Decreased range of motion (ROM) of both knees  Muscle weakness (generalized)  Difficulty in walking, not elsewhere classified     Problem List Patient Active Problem List   Diagnosis Date Noted  . Encounter for hospice care discussion   . MRSA bacteremia 02/17/2020  . Streptococcal bacteremia 02/17/2020  . Bacteremia 02/17/2020  . History of breast cancer 02/17/2020  . Goals of care, counseling/discussion   . Palliative care by specialist   . DNR (do not resuscitate) discussion   . Breast pain, right 01/11/2020  . Femoral neck fracture ([place]) 06/27/2019  . Femur fracture, left ([place]) 06/26/2019  . Right lower lobe pulmonary nodule 05/04/2019  . Syncope 09/27/2017  . Atypical chest pain 09/03/2017  . Essential hypertension 09/03/2017  . Short-term memory loss 09/03/2017  . Osteoporosis 04/02/2016  . Breast cancer, right [facility]) 04/06/2008   Myles Gip PT, DPT [phone] 03/09/2020, 12:50 PM  [facility] [address] [place], [place], [address] Phone: [phone]   Fax:   [phone]  Name: Cynthia Walker MRN: [id number] Date of Birth: 05-30-1935

## 2020-03-09 NOTE — Telephone Encounter (Signed)
T/c from family that patient was very sleepy after 25 mg. Husband was very concerned about this. I phoned back and discussed the amount being a low dose but that patient may have been very sensitive. I advised to let the medication clear a day or two then try again at a quarter dose and titrate to the effect of controlling her evening agitation. He voiced agreement and understanding. The medication was compounded to suspension so he can reduce to 2.5 mg if needed. PCP advised.

## 2020-03-14 ENCOUNTER — Ambulatory Visit: Payer: Medicare HMO | Admitting: Physical Therapy

## 2020-03-14 ENCOUNTER — Other Ambulatory Visit: Payer: Self-pay

## 2020-03-14 DIAGNOSIS — M25661 Stiffness of right knee, not elsewhere classified: Secondary | ICD-10-CM | POA: Diagnosis not present

## 2020-03-14 DIAGNOSIS — M6281 Muscle weakness (generalized): Secondary | ICD-10-CM

## 2020-03-14 DIAGNOSIS — R262 Difficulty in walking, not elsewhere classified: Secondary | ICD-10-CM

## 2020-03-14 NOTE — Therapy (Signed)
[facility] [address] [place], [place], [address] Phone: [phone]   Fax:  [phone]  Physical Therapy Treatment  Patient Details  Name: Cynthia Walker MRN: [id number] Date of Birth: [birth year] Referring Provider (PT): Feldpausch   Encounter Date: 03/14/2020  PT End of Session - 03/14/20 1229    Visit Number  8    Number of Visits  24    Date for PT Re-Evaluation  05/03/20    PT Start Time  1111    PT Stop Time  1156    PT Time Calculation (min)  45 min    Equipment Utilized During Treatment  Gait belt    Activity Tolerance  Patient limited by fatigue;Other (comment)   Patient limited by somnolence.   Behavior During Therapy  [facility] for tasks assessed/performed   Highly distractable      Past Medical History:  Diagnosis Date  . Breast cancer [facility]) 2009   Right breast CA with lumpectomy, radiation and chemo tx's.  . Dementia ([place])   . Hypertension   . Personal history of chemotherapy   . Personal history of radiation therapy     Past Surgical History:  Procedure Laterality Date  . BREAST BIOPSY Right 03/15/2008   invasive mammary carcinoma  . BREAST LUMPECTOMY Right 04/14/2008   Pathology revealed a 1.1 cm grade I invasive ductal carcinoma. Margins were negative.   . COLONOSCOPY WITH PROPOFOL N/A 07/10/2015   Procedure: COLONOSCOPY WITH PROPOFOL;  Surgeon: Hulen Luster, MD;  [facility] ENDOSCOPY;  Service: Gastroenterology;  Laterality: N/A;  . INTRAMEDULLARY (IM) NAIL INTERTROCHANTERIC Left 06/27/2019   Procedure: INTRAMEDULLARY (IM) NAIL INTERTROCHANTRIC;  Surgeon: Creig Hines, MD;  Location: [facility] ORS;  Service: Orthopedics;  Laterality: Left;  . TONSILLECTOMY     age of 85 years old    There were no vitals filed for this visit.  Subjective Assessment - 03/14/20 1223    Subjective  Patient arrives about 10 minutes late to clinic with her spouse. She is awake and alert on arrival, but very quickly  distracted in the gym. Patient's spouse notes that she was having some difficulty with standing for hygiene and dressing this morning 2/2 to attention deficits. Patient's spouse does note that the dose for seroquel was reduced to 25% of the original dose and it is working much better.    Patient is accompained by:  Family member   husband, Cynthia Walker   Pertinent History  Femur fracture, left (CMS-HCC) 06/26/2019; Patient was ambulatory without AD prior to the fall that resulted in fx. Patient's spouse notes that she was capable of walking ~0.5 miles prior to the fall. Patient's spouse reports that she can at present stand for up to 10 min, but not consistently and not without assistance. While working with HHPT patient was able to ambulate with a RW for ~ 200 feet with rest breaks throughout.    Limitations  Lifting;Standing;Walking;House hold activities    How long can you sit comfortably?  Unlimited    How long can you stand comfortably?  5-10 min with support    How long can you walk comfortably?  < 200 feet    Patient Stated Goals  2/2 to cognitive state, patient did not state speicifc goals. Spouse would like to see her ambulatory within the house.       TREATMENT  Neuromuscular Re-education: // bars: Standing posture/balance, BUE support, ModA, 2 x 2 min, max VCs for erect posture Standing lateral  weight shifts, BUE support, ModA, x20 each side with VCs for orientation to task Standing toe taps, BUE support, MinA, x10 each side with VCs for orientation to task Gait in bars, MaxA x2, BUE support, x10 steps with maximum encouragement and VCs for sequencing, safety, and attention to task  Therapeutic Exercise: Seated hamstring stretch, BLE, 1 min x2 each leg for improved ability to stand Seated marches, ModA, BLE, x15 each Seated hip extension with manual resistance, BLE, x10 each  Patient educated throughout session on appropriate technique and form using multi-modal cueing, HEP, and activity  modification. Patient articulated understanding and returned demonstration.  Patient Response to interventions: Patient drowsy by end of session.  ASSESSMENT Patient presents to clinic alert enough to participate in therapy. Patient demonstrates deficits in cognition, posture, BLE strength, gait, balance, and function. Patient requiring max encouragement and reorientation to task during today's session and responded positively to active interventions albeit needing adequate rest and reorientation. Patient able to walk for about 10 steps with MaxA for stepping and sequencing with w/c follow. Patient will benefit from continued skilled therapeutic intervention to address remaining deficits in cognition, posture, BLE strength, gait, balance, and function in order to increase function and improve overall QOL.    PT Long Term Goals - 02/09/20 1415      PT LONG TERM GOAL #1   Title  Patient will be modified independent with spousal support with HEP for improved strength and balance in order to decrease fall risk and dependence at home and in the community.    Baseline  IE: not demonstrated    Time  12    Period  Weeks    Status  New    Target Date  05/03/20      PT LONG TERM GOAL #2   Title  Patient will demonstrate improved function as evidenced by a score of 44 on FOTO measure for full participation in activities at home and in the community.    Baseline  IE: 16    Time  12    Period  Weeks    Status  New    Target Date  05/03/20      PT LONG TERM GOAL #3   Title  Patient will perform STS with BUE support with Modified IND/SBA with VCs for sequencing and safety in order to participate more fully in basic ADLs.    Baseline  IE: Mod/MinA, MAX VCs and TCs for sequencing/safety    Time  12    Period  Weeks    Status  New    Target Date  05/03/20      PT LONG TERM GOAL #4   Title  Patient will demonstrate improved B knee extension ROM as evidenced by lacking < 10 degrees of B knee  extension PROM/AROM in order to achieve standing posture with greater independence.    Baseline  IE: B lacking 15-20 degrees of knee extension    Time  12    Period  Weeks    Status  New    Target Date  05/03/20      PT LONG TERM GOAL #5   Title  Patient will demonstrate improved static standing balance as evidenced by ability to maintain standing posture with CGA/SBA, SUE support for 4 minutes in order to participate in basic ADLs.    Baseline  IE: 90 sec, MinA, BUE support    Time  12    Period  Weeks    Status  New  Target Date  05/03/20            Plan - 03/14/20 1229    Clinical Impression Statement  Patient presents to clinic alert enough to participate in therapy. Patient demonstrates deficits in cognition, posture, BLE strength, gait, balance, and function. Patient requiring max encouragement and reorientation to task during today's session and responded positively to active interventions albeit needing adequate rest and reorientation. Patient able to walk for about 10 steps with MaxA for stepping and sequencing with w/c follow. Patient will benefit from continued skilled therapeutic intervention to address remaining deficits in cognition, posture, BLE strength, gait, balance, and function in order to increase function and improve overall QOL.    Personal Factors and Comorbidities  Age;Education;Comorbidity 3+;Past/Current Experience;Time since onset of injury/illness/exacerbation;Fitness;Transportation;Other   Cognitive State   Comorbidities  anemia, breast cancer, chronic constipation, diverticulosis, hypertension, hyperlipidemia, osteoporosis    Examination-Activity Limitations  Bathing;Dressing;Transfers;Hygiene/Grooming;Bed Mobility;Squat;Lift;Bend;Locomotion Level;Stairs;Reach Overhead;Stand;Toileting;Carry    Examination-Participation Restrictions  Church;Interpersonal Relationship;Personal Finances;Yard Work;Cleaning;Laundry;Community Activity;Medication  Management;Shop;Driving;Meal Prep    Stability/Clinical Decision Making  Evolving/Moderate complexity    Rehab Potential  Fair    PT Frequency  2x / week    PT Duration  12 weeks    PT Treatment/Interventions  Moist Heat;DME Instruction;Therapeutic activities;Functional mobility training;Stair training;Gait training;Therapeutic exercise;Balance training;Neuromuscular re-education;Cognitive remediation;Orthotic Fit/Training;Patient/family education;Manual techniques;Scar mobilization;Passive range of motion;Energy conservation;Taping    PT Next Visit Plan  Hamstring extensibility, standing tolerance with weight shifts    PT Home Exercise Plan  hamstring stretch    Consulted and Agree with Plan of Care  Patient;Family member/caregiver    Family Member Consulted  Cynthia Walker, husband       Patient will benefit from skilled therapeutic intervention in order to improve the following deficits and impairments:  Abnormal gait, Decreased balance, Decreased endurance, Decreased mobility, Difficulty walking, Hypomobility, Postural dysfunction, Improper body mechanics, Increased edema, Decreased range of motion, Decreased cognition, Decreased activity tolerance, Decreased coordination, Decreased safety awareness, Decreased strength, Impaired flexibility  Visit Diagnosis: Decreased range of motion (ROM) of both knees  Muscle weakness (generalized)  Difficulty in walking, not elsewhere classified     Problem List Patient Active Problem List   Diagnosis Date Noted  . Encounter for hospice care discussion   . MRSA bacteremia 02/17/2020  . Streptococcal bacteremia 02/17/2020  . Bacteremia 02/17/2020  . History of breast cancer 02/17/2020  . Goals of care, counseling/discussion   . Palliative care by specialist   . DNR (do not resuscitate) discussion   . Breast pain, right 01/11/2020  . Femoral neck fracture ([place]) 06/27/2019  . Femur fracture, left ([place]) 06/26/2019  . Right lower lobe  pulmonary nodule 05/04/2019  . Syncope 09/27/2017  . Atypical chest pain 09/03/2017  . Essential hypertension 09/03/2017  . Short-term memory loss 09/03/2017  . Osteoporosis 04/02/2016  . Breast cancer, right [facility]) 04/06/2008   Myles Gip PT, DPT [phone] 03/14/2020, 12:37 PM  [facility] [address]. [place], [place], [address] Phone: [phone]   Fax:  [phone]  Name: Cynthia Walker MRN: [id number] Date of Birth: Dec 10, 1934

## 2020-03-16 ENCOUNTER — Other Ambulatory Visit: Payer: Self-pay

## 2020-03-16 ENCOUNTER — Ambulatory Visit: Payer: Medicare HMO | Admitting: Physical Therapy

## 2020-03-16 ENCOUNTER — Encounter: Payer: Self-pay | Admitting: Physical Therapy

## 2020-03-16 ENCOUNTER — Other Ambulatory Visit: Payer: Medicare HMO | Admitting: Primary Care

## 2020-03-16 DIAGNOSIS — R413 Other amnesia: Secondary | ICD-10-CM

## 2020-03-16 DIAGNOSIS — M25661 Stiffness of right knee, not elsewhere classified: Secondary | ICD-10-CM

## 2020-03-16 DIAGNOSIS — Z515 Encounter for palliative care: Secondary | ICD-10-CM

## 2020-03-16 DIAGNOSIS — M6281 Muscle weakness (generalized): Secondary | ICD-10-CM

## 2020-03-16 DIAGNOSIS — R262 Difficulty in walking, not elsewhere classified: Secondary | ICD-10-CM

## 2020-03-16 NOTE — Therapy (Signed)
[facility] [address] [place], [place], [address] Phone: [phone]   Fax:  [phone]  Physical Therapy Treatment  Patient Details  Name: Cynthia Walker MRN: [id number] Date of Birth: September 25, 1935 Referring Provider (PT): Feldpausch   Encounter Date: 03/16/2020   PT End of Session - 03/16/20 1253    Visit Number 9    Number of Visits 24    Date for PT Re-Evaluation 05/03/20    PT Start Time 1115    PT Stop Time 1202    PT Time Calculation (min) 47 min    Equipment Utilized During Treatment Gait belt    Activity Tolerance Patient limited by fatigue    Behavior During Therapy [facility] for tasks assessed/performed   Highly distractable          Past Medical History:  Diagnosis Date  . Breast cancer [facility]) 2009   Right breast CA with lumpectomy, radiation and chemo tx's.  . Dementia ([place])   . Hypertension   . Personal history of chemotherapy   . Personal history of radiation therapy     Past Surgical History:  Procedure Laterality Date  . BREAST BIOPSY Right 03/15/2008   invasive mammary carcinoma  . BREAST LUMPECTOMY Right 04/14/2008   Pathology revealed a 1.1 cm grade I invasive ductal carcinoma. Margins were negative.   . COLONOSCOPY WITH PROPOFOL N/A 07/10/2015   Procedure: COLONOSCOPY WITH PROPOFOL;  Surgeon: Hulen Luster, MD;  Location: [facility] ENDOSCOPY;  Service: Gastroenterology;  Laterality: N/A;  . INTRAMEDULLARY (IM) NAIL INTERTROCHANTERIC Left 06/27/2019   Procedure: INTRAMEDULLARY (IM) NAIL INTERTROCHANTRIC;  Surgeon: Creig Hines, MD;  Location: [facility] ORS;  Service: Orthopedics;  Laterality: Left;  . TONSILLECTOMY     age of 85 years old    There were no vitals filed for this visit.   Subjective Assessment - 03/16/20 1252    Subjective Patient and spouse arrive late due to an appointment with palliative care in the home. Patient is awake and alert; oriented to self and spouse. Patient is amenable to  therapy stating "Well I'm here, aren't I?"    Patient is accompained by: Family member   husband, [name]   Pertinent History Femur fracture, left (CMS-HCC) 06/26/2019; Patient was ambulatory without AD prior to the fall that resulted in fx. Patient's spouse notes that she was capable of walking ~0.5 miles prior to the fall. Patient's spouse reports that she can at present stand for up to 10 min, but not consistently and not without assistance. While working with HHPT patient was able to ambulate with a RW for ~ 200 feet with rest breaks throughout.    Limitations Lifting;Standing;Walking;House hold activities    How long can you sit comfortably? Unlimited    How long can you stand comfortably? 5-10 min with support    How long can you walk comfortably? < 200 feet    Patient Stated Goals 2/2 to cognitive state, patient did not state speicifc goals. Spouse would like to see her ambulatory within the house.           TREATMENT  Neuromuscular Re-education: // bars: Standing posture/balance, BUE support, ModA, 2 x 2 min, max VCs for erect posture Standing lateral weight shifts, BUE support, ModA, x20 each side with VCs for orientation to task Standing toe taps, BUE support, MinA, x10 each side with VCs for orientation to task Gait in bars, MaxA x2, BUE support, 2x5 steps with maximum encouragement and VCs for sequencing, safety,  and attention to task  Therapeutic Exercise: Seated hamstring stretch, BLE, 1 min x2 each leg for improved ability to stand Seated marches, ModA, BLE, x15 each  Patient educated throughout session on appropriate technique and form using multi-modal cueing, HEP, and activity modification. Patient articulated understanding and returned demonstration.  Patient Response to interventions: Patient states "We'll see if I come back next week."  ASSESSMENT Patient presents to clinic alert enough to participate in therapy. Patient demonstrates deficits in cognition, posture, BLE  strength, gait, balance, and function. Patient continues to require max encouragement and reorientation to task during today's session and responded positively to active interventions when participatory. Patient able to walk for two bouts of 5 steps in // bars with Max A and max physical prompting/cueing for sequence. Patient will benefit from continued skilled therapeutic intervention to address remaining deficits in cognition, posture, BLE strength, gait, balance, and function in order to increase function and improve overall QOL.       PT Long Term Goals - 02/09/20 1415      PT LONG TERM GOAL #1   Title Patient will be modified independent with spousal support with HEP for improved strength and balance in order to decrease fall risk and dependence at home and in the community.    Baseline IE: not demonstrated    Time 12    Period Weeks    Status New    Target Date 05/03/20      PT LONG TERM GOAL #2   Title Patient will demonstrate improved function as evidenced by a score of 44 on FOTO measure for full participation in activities at home and in the community.    Baseline IE: 16    Time 12    Period Weeks    Status New    Target Date 05/03/20      PT LONG TERM GOAL #3   Title Patient will perform STS with BUE support with Modified IND/SBA with VCs for sequencing and safety in order to participate more fully in basic ADLs.    Baseline IE: Mod/MinA, MAX VCs and TCs for sequencing/safety    Time 12    Period Weeks    Status New    Target Date 05/03/20      PT LONG TERM GOAL #4   Title Patient will demonstrate improved B knee extension ROM as evidenced by lacking < 10 degrees of B knee extension PROM/AROM in order to achieve standing posture with greater independence.    Baseline IE: B lacking 15-20 degrees of knee extension    Time 12    Period Weeks    Status New    Target Date 05/03/20      PT LONG TERM GOAL #5   Title Patient will demonstrate improved static standing  balance as evidenced by ability to maintain standing posture with CGA/SBA, SUE support for 4 minutes in order to participate in basic ADLs.    Baseline IE: 90 sec, MinA, BUE support    Time 12    Period Weeks    Status New    Target Date 05/03/20                 Plan - 03/16/20 1254    Clinical Impression Statement Patient presents to clinic alert enough to participate in therapy. Patient demonstrates deficits in cognition, posture, BLE strength, gait, balance, and function. Patient continues to require max encouragement and reorientation to task during today's session and responded positively to active interventions when  participatory. Patient able to walk for two bouts of 5 steps in // bars with Max A and max physical prompting/cueing for sequence. Patient will benefit from continued skilled therapeutic intervention to address remaining deficits in cognition, posture, BLE strength, gait, balance, and function in order to increase function and improve overall QOL.    Personal Factors and Comorbidities Age;Education;Comorbidity 3+;Past/Current Experience;Time since onset of injury/illness/exacerbation;Fitness;Transportation;Other   Cognitive State   Comorbidities anemia, breast cancer, chronic constipation, diverticulosis, hypertension, hyperlipidemia, osteoporosis    Examination-Activity Limitations Bathing;Dressing;Transfers;Hygiene/Grooming;Bed Mobility;Squat;Lift;Bend;Locomotion Level;Stairs;Reach Overhead;Stand;Toileting;Carry    Examination-Participation Restrictions Church;Interpersonal Relationship;Personal Finances;Yard Work;Cleaning;Laundry;Community Activity;Medication Management;Shop;Driving;Meal Prep    Stability/Clinical Decision Making Evolving/Moderate complexity    Rehab Potential Fair    PT Frequency 2x / week    PT Duration 12 weeks    PT Treatment/Interventions Moist Heat;DME Instruction;Therapeutic activities;Functional mobility training;Stair training;Gait  training;Therapeutic exercise;Balance training;Neuromuscular re-education;Cognitive remediation;Orthotic Fit/Training;Patient/family education;Manual techniques;Scar mobilization;Passive range of motion;Energy conservation;Taping    PT Next Visit Plan Hamstring extensibility, standing tolerance with weight shifts    PT Home Exercise Plan hamstring stretch    Consulted and Agree with Plan of Care Patient;Family member/caregiver    Family Member Consulted Lauralyn Shadowens, husband           Patient will benefit from skilled therapeutic intervention in order to improve the following deficits and impairments:  Abnormal gait, Decreased balance, Decreased endurance, Decreased mobility, Difficulty walking, Hypomobility, Postural dysfunction, Improper body mechanics, Increased edema, Decreased range of motion, Decreased cognition, Decreased activity tolerance, Decreased coordination, Decreased safety awareness, Decreased strength, Impaired flexibility  Visit Diagnosis: Decreased range of motion (ROM) of both knees  Muscle weakness (generalized)  Difficulty in walking, not elsewhere classified     Problem List Patient Active Problem List   Diagnosis Date Noted  . Encounter for hospice care discussion   . MRSA bacteremia 02/17/2020  . Streptococcal bacteremia 02/17/2020  . Bacteremia 02/17/2020  . History of breast cancer 02/17/2020  . Goals of care, counseling/discussion   . Palliative care by specialist   . DNR (do not resuscitate) discussion   . Breast pain, right 01/11/2020  . Femoral neck fracture ([place]) 06/27/2019  . Femur fracture, left ([place]) 06/26/2019  . Right lower lobe pulmonary nodule 05/04/2019  . Syncope 09/27/2017  . Atypical chest pain 09/03/2017  . Essential hypertension 09/03/2017  . Short-term memory loss 09/03/2017  . Osteoporosis 04/02/2016  . Breast cancer, right [facility]) 04/06/2008   Myles Gip PT, DPT [phone] 03/16/2020, 12:58 PM  [facility] [address] [place], [place], [address] Phone: [phone]   Fax:  [phone]  Name: BEYONCE SAWATZKY MRN: [id number] Date of Birth: [birth year]

## 2020-03-16 NOTE — Progress Notes (Signed)
  [facility] Consult Note Telephone: [phone]  Fax: [phone]  PATIENT NAME: Cynthia Walker [address] [place] [address] [phone] (home)  DOB: 08/11/1935 MRN: [id number]  PRIMARY CARE PROVIDER:    Feldpausch, Cynthia E, MD,  [address] [place] [address] [phone]  REFERRING PROVIDER:   Feldpausch, Cynthia E, MD [address] [place],  [place] [address] [phone]  RESPONSIBLE PARTY:   Extended Emergency Contact Information Primary Emergency Contact: Walker,Cynthia E Address: [address]          [place], [place] [address] [place] Home Phone: [phone] Mobile Phone: [phone] Relation: Spouse Secondary Emergency Contact: Walker,Cynthia Address: [address]          [place], [place] [address] [place] Home Phone: [phone] Relation: Daughter  I met with patient and family in home.  ASSESSMENT AND RECOMMENDATIONS:   1. Advance Care Planning/Goals of Care: Goals include to maximize quality of life and symptom management. Our advance care planning conversation included a discussion about:     The value and importance of advance care planning   Experiences with loved ones who have been seriously ill or have died   Exploration of personal, cultural or spiritual beliefs that might influence medical decisions   Exploration of goals of care in the event of a sudden injury or illness   Identification and preparation of a healthcare agent , they HCPOA's responsibility and who he might want to choose. Discussed order of succession if no POA is named.  Potential for  an  advance directive document . MOST given, still tbd.   2. Symptom Management:   Nutrition: No wt available, Requested weight from PT services at [facility] [place].I witnessed her eating 100 % breakfast cereal and juice. They are getting meals from insurance company. We also discussed a MOW. Mr Rippeon states  they do eat out a lot, mainly at [employer]. Although her albumin was sub normal she seems to be eating well, and may need more protein intense choices.  Agitation / insomnia: Taking sertraline and quetiapine altho husband is somewhat confused between generic and trade names. Quetiapine working well at 6.25 mg at hs. Pt appears well rested today.  Caregiving: Ongoing need. Self assessment tool given. Discussed the various payor sources and possible solutions. Will require more discussion.  3. Family /Caregiver/Community Supports: Husband is caregiver, have some hired care givers. I gave him a self administered care giver strain assessment tool to look  Over. I will use this to quantify strain and for recommendation purposes. We discussed [facility] and live in care givers. They have a church they are active in.  4. Cognitive / Functional decline: FAST score 6D-7B. Able to smile, transfer with help. Able to make a few contextual comments during interview.  5. Follow up Palliative Care Visit: Palliative care will continue to follow for goals of care clarification and symptom management. Return 2-3 weeks or prn.  I spent 40 minutes providing this consultation,  from 0940 to 1020. More than 50% of the time in this consultation was spent coordinating communication.   HISTORY OF PRESENT ILLNESS:  Cynthia Walker is a 84 y.o. year old female with multiple medical problems including weakness, dementia. Palliative Care was asked to follow this patient by consultation request of Walker, Cynthia E, MD to help address advance care planning and goals of care. This is a follow up visit.  CODE STATUS:   TBD  PPS: 30%  HOSPICE ELIGIBILITY/DIAGNOSIS: TBD  PAST MEDICAL HISTORY:  Past Medical History:  Diagnosis Date  . Breast cancer (HCC) 2009   Right breast CA with lumpectomy, radiation and chemo tx's.  . Dementia (HCC)   . Hypertension   . Personal history of chemotherapy   . Personal history of  radiation therapy     SOCIAL HX:  Social History   Tobacco Use  . Smoking status: Never Smoker  . Smokeless tobacco: Never Used  Substance Use Topics  . Alcohol use: No    ALLERGIES:  Allergies  Allergen Reactions  . Flu Virus Vaccine     Other reaction(s): Other (See Comments) Stomach cramps  . Latex Swelling  . Other     Other reaction(s): Unknown Allergy to eggs  . Procaine Other (See Comments)    weakness  . Soy Isoflavones     Other reaction(s): Other (See Comments) Soy causes legs to feel like rubber  . Penicillins Rash    Has patient had a PCN reaction causing immediate rash, facial/tongue/throat swelling, SOB or lightheadedness with hypotension: No Has patient had a PCN reaction causing severe rash involving mucus membranes or skin necrosis: No Has patient had a PCN reaction that required hospitalization: No Has patient had a PCN reaction occurring within the last 10 years: No If all of the above answers are "NO", then may proceed with Cephalosporin use.      PERTINENT MEDICATIONS:  Outpatient Encounter Medications as of 03/16/2020  Medication Sig  . donepezil (ARICEPT) 5 MG tablet Take 5 mg by mouth daily.  . furosemide (LASIX) 20 MG tablet Take 20 mg by mouth daily.  . Multiple Vitamin (MULTI-VITAMINS) TABS Take by mouth.  . QUEtiapine (SEROQUEL) 25 MG tablet Take 25 mg by mouth daily. At 4 pm. Compounded into suspension  . sertraline (ZOLOFT) 50 MG tablet Take 75 mg by mouth daily.    No facility-administered encounter medications on file as of 03/16/2020.    PHYSICAL EXAM / ROS:   Current and past weights: unavailable General: NAD, frail appearing, thin Cardiovascular: no chest pain reported, no edema  Pulmonary: no cough, no increased SOB, room air Abdomen: appetite good, incontinent of bowel at times GU: denies dysuria, incontinent of urine at times MSK:  + joint and ROM abnormalities, non ambulatory , can transfer with help, doing PT out pt. Skin:  no rashes or wounds reported Neurological: Weakness, Moderately advanced dementia, FAST score 6D-7A   McKelvey , NP [facility]  COVID-19 PATIENT SCREENING TOOL  Person answering questions: ____________Richard______ _____   1.  Is the patient or any family member in the home showing any signs or symptoms regarding respiratory infection?               Person with Symptom- __________NA_________________  a. Fever                                                                          Yes___ No___          ___________________  b. Shortness of breath                                                      Yes___ No___          ___________________ c. Cough/congestion                                       Yes___  No___         ___________________ d. Body aches/pains                                                         Yes___ No___        ____________________ e. Gastrointestinal symptoms (diarrhea, nausea)           Yes___ No___        ____________________  2. Within the past 14 days, has anyone living in the home had any contact with someone with or under investigation for COVID-19?    Yes___ No_X_   Person __________________   

## 2020-03-21 ENCOUNTER — Encounter: Payer: Self-pay | Admitting: Physical Therapy

## 2020-03-21 ENCOUNTER — Ambulatory Visit: Payer: Medicare HMO | Admitting: Physical Therapy

## 2020-03-21 ENCOUNTER — Other Ambulatory Visit: Payer: Self-pay

## 2020-03-21 DIAGNOSIS — M25661 Stiffness of right knee, not elsewhere classified: Secondary | ICD-10-CM | POA: Diagnosis not present

## 2020-03-21 DIAGNOSIS — R262 Difficulty in walking, not elsewhere classified: Secondary | ICD-10-CM

## 2020-03-21 DIAGNOSIS — M6281 Muscle weakness (generalized): Secondary | ICD-10-CM

## 2020-03-21 NOTE — Therapy (Signed)
[facility] [address]. [place], [place], [address] Phone: [phone]   Fax:  [phone]  Physical Therapy Treatment Physical Therapy Progress Note   Dates of reporting period  02/09/2020   to   03/21/2020   Patient Details  Name: Cynthia Walker MRN: [id number] Date of Birth: August 28, 1935 Referring Provider (PT): Feldpausch   Encounter Date: 03/21/2020   PT End of Session - 03/21/20 1229    Visit Number 10    Number of Visits 24    Date for PT Re-Evaluation 05/03/20    PT Start Time 1105    PT Stop Time 1205    PT Time Calculation (min) 60 min    Equipment Utilized During Treatment Gait belt    Activity Tolerance Patient tolerated treatment well    Behavior During Therapy WFL for tasks assessed/performed   Highly distractable          Past Medical History:  Diagnosis Date  . Breast cancer [facility]) 2009   Right breast CA with lumpectomy, radiation and chemo tx's.  . Dementia ([place])   . Hypertension   . Personal history of chemotherapy   . Personal history of radiation therapy     Past Surgical History:  Procedure Laterality Date  . BREAST BIOPSY Right 03/15/2008   invasive mammary carcinoma  . BREAST LUMPECTOMY Right 04/14/2008   Pathology revealed a 1.1 cm grade I invasive ductal carcinoma. Margins were negative.   . COLONOSCOPY WITH PROPOFOL N/A 07/10/2015   Procedure: COLONOSCOPY WITH PROPOFOL;  Surgeon: Hulen Luster, MD;  Location: [facility] END[facility]COPY;  Service: Gastroenterology;  Laterality: N/A;  . INTRAMEDULLARY (IM) NAIL INTERTROCHANTERIC Left 06/27/2019   Procedure: INTRAMEDULLARY (IM) NAIL INTERTROCHANTRIC;  Surgeon: Creig Hines, MD;  Location: [facility] ORS;  Service: Orthopedics;  Laterality: Left;  . TONSILLECTOMY     age of 85 years old    There were no vitals filed for this visit.   Subjective Assessment - 03/21/20 1229    Subjective Patient presents to clinic in excellent spirits and is alert. Patient and  spouse deny any concerns or significant changes since last visit.    Patient is accompained by: Family member   husband, [name]   Pertinent History Femur fracture, left (CMS-HCC) 06/26/2019; Patient was ambulatory without AD prior to the fall that resulted in fx. Patient's spouse notes that she was capable of walking ~0.5 miles prior to the fall. Patient's spouse reports that she can at present stand for up to 10 min, but not consistently and not without assistance. While working with HHPT patient was able to ambulate with a RW for ~ 200 feet with rest breaks throughout.    Limitations Lifting;Standing;Walking;House hold activities    How long can you sit comfortably? Unlimited    How long can you stand comfortably? 5-10 min with support    How long can you walk comfortably? < 200 feet    Patient Stated Goals 2/2 to cognitive state, patient did not state speicifc goals. Spouse would like to see her ambulatory within the house.    Currently in Pain? No/denies            TREATMENT  Neuromuscular Re-education: // bars: Standing posture/balance, BUE support, SBA/CGA, x2.5 min Standing lateral weight shifts, BUE support, MinA, x20 each side with VCs for orientation to task Gait, Min/ModA x1 with w/c follow, BUE support, x3 lengths with consistent encouragement and VCs for sequencing, safety, and attention to task  Therapeutic Exercise: Seated  hamstring stretch, BLE, 2 min x2 each leg for improved ability to stand STS x5, BUE support, SBA/CGA, min/mod VCs for sequencing  Patient educated throughout session on appropriate technique and form using multi-modal cueing, HEP, and activity modification. Patient articulated understanding and returned demonstration.  Patient Response to interventions: Patient smiled after finishing walking.  ASSESSMENT Patient presents to clinic alert enough to participate in therapy. Patient demonstrates deficits in cognition, posture, BLE strength, gait, balance, and  function. Patient had significant improvement in standing activity participation during today's session and responded positively to active interventions. Patient able to walk for 3 lengths in the // bars with w/c follow, BUE support, and Min/ModA with greatest difficulty controlling posture in LLE WB. Patient's condition has the potential to improve in response to therapy. Maximum improvement is yet to be obtained. The anticipated improvement is attainable and reasonable in a generally predictable time. Patient will benefit from continued skilled therapeutic intervention to address remaining deficits in cognition, posture, BLE strength, gait, balance, and function in order to increase function and improve overall QOL.     PT Long Term Goals - 03/21/20 1230      PT LONG TERM GOAL #1   Title Patient will be modified independent with spousal support with HEP for improved strength and balance in order to decrease fall risk and dependence at home and in the community.    Baseline IE: not demonstrated; 6/15: good cargegiver participation and understanding    Time 12    Period Weeks    Status Achieved    Target Date 05/03/20      PT LONG TERM GOAL #2   Title Patient will demonstrate improved function as evidenced by a score of 44 on FOTO measure for full participation in activities at home and in the community.    Baseline IE: 16; 6/15: deferred 2/2 to technical issues    Time 12    Period Weeks    Status On-going    Target Date 05/03/20      PT LONG TERM GOAL #3   Title Patient will perform STS with BUE support with Modified IND/SBA with VCs for sequencing and safety in order to participate more fully in basic ADLs.    Baseline IE: Mod/MinA, MAX VCs and TCs for sequencing/safety; 6/15: SBA, BUE // bar support, Min/Mod VCs for sequencing    Time 12    Period Weeks    Status On-going    Target Date 05/03/20      PT LONG TERM GOAL #4   Title Patient will demonstrate improved B knee extension  ROM as evidenced by lacking < 10 degrees of B knee extension PROM/AROM in order to achieve standing posture with greater independence.    Baseline IE: B lacking 15-20 degrees of knee extension; 6/15: R 0 degrees, L lacking 5 degrees    Time 12    Period Weeks    Status Achieved    Target Date 05/03/20      PT LONG TERM GOAL #5   Title Patient will demonstrate improved static standing balance as evidenced by ability to maintain standing posture with CGA/SBA, SUE support for 4 minutes in order to participate in basic ADLs.    Baseline IE: 90 sec, MinA, BUE support; 6/15: 2.5 min, CGA, BUE support    Time 12    Period Weeks    Status On-going    Target Date 05/03/20  Plan - 03/21/20 1239    Clinical Impression Statement Patient presents to clinic alert enough to participate in therapy. Patient demonstrates deficits in cognition, posture, BLE strength, gait, balance, and function. Patient had significant improvement in standing activity participation during today's session and responded positively to active interventions. Patient able to walk for 3 lengths in the // bars with w/c follow, BUE support, and Min/ModA with greatest difficulty controlling posture in LLE WB. Patient's condition has the potential to improve in response to therapy. Maximum improvement is yet to be obtained. The anticipated improvement is attainable and reasonable in a generally predictable time. Patient will benefit from continued skilled therapeutic intervention to address remaining deficits in cognition, posture, BLE strength, gait, balance, and function in order to increase function and improve overall QOL.    Personal Factors and Comorbidities Age;Education;Comorbidity 3+;Past/Current Experience;Time since onset of injury/illness/exacerbation;Fitness;Transportation;Other   Cognitive State   Comorbidities anemia, breast cancer, chronic constipation, diverticulosis, hypertension, hyperlipidemia,  osteoporosis    Examination-Activity Limitations Bathing;Dressing;Transfers;Hygiene/Grooming;Bed Mobility;Squat;Lift;Bend;Locomotion Level;Stairs;Reach Overhead;Stand;Toileting;Carry    Examination-Participation Restrictions Church;Interpersonal Relationship;Personal Finances;Yard Work;Cleaning;Laundry;Community Activity;Medication Management;Shop;Driving;Meal Prep    Stability/Clinical Decision Making Evolving/Moderate complexity    Rehab Potential Fair    PT Frequency 2x / week    PT Duration 12 weeks    PT Treatment/Interventions Moist Heat;DME Instruction;Therapeutic activities;Functional mobility training;Stair training;Gait training;Therapeutic exercise;Balance training;Neuromuscular re-education;Cognitive remediation;Orthotic Fit/Training;Patient/family education;Manual techniques;Scar mobilization;Passive range of motion;Energy conservation;Taping    PT Next Visit Plan Hamstring extensibility, standing tolerance with weight shifts    PT Home Exercise Plan hamstring stretch    Consulted and Agree with Plan of Care Patient;Family member/caregiver    Family Member Consulted Loye Reininger, husband           Patient will benefit from skilled therapeutic intervention in order to improve the following deficits and impairments:  Abnormal gait, Decreased balance, Decreased endurance, Decreased mobility, Difficulty walking, Hypomobility, Postural dysfunction, Improper body mechanics, Increased edema, Decreased range of motion, Decreased cognition, Decreased activity tolerance, Decreased coordination, Decreased safety awareness, Decreased strength, Impaired flexibility  Visit Diagnosis: Decreased range of motion (ROM) of both knees  Muscle weakness (generalized)  Difficulty in walking, not elsewhere classified     Problem List Patient Active Problem List   Diagnosis Date Noted  . Encounter for hospice care discussion   . MRSA bacteremia 02/17/2020  . Streptococcal bacteremia  02/17/2020  . Bacteremia 02/17/2020  . History of breast cancer 02/17/2020  . Goals of care, counseling/discussion   . Palliative care by specialist   . DNR (do not resuscitate) discussion   . Breast pain, right 01/11/2020  . Femoral neck fracture ([place]) 06/27/2019  . Femur fracture, left ([place]) 06/26/2019  . Right lower lobe pulmonary nodule 05/04/2019  . Syncope 09/27/2017  . Atypical chest pain 09/03/2017  . Essential hypertension 09/03/2017  . Short-term memory loss 09/03/2017  . Osteoporosis 04/02/2016  . Breast cancer, right [facility]) 04/06/2008   Myles Gip PT, DPT [phone]  03/21/2020, 12:41 PM  [facility] [address] [place], [place], [address] Phone: [phone]   Fax:  [phone]  Name: REBEL LAUGHRIDGE MRN: [id number] Date of Birth: 05/16/1935

## 2020-03-23 ENCOUNTER — Ambulatory Visit: Payer: Medicare HMO | Admitting: Physical Therapy

## 2020-03-23 ENCOUNTER — Other Ambulatory Visit: Payer: Self-pay

## 2020-03-23 ENCOUNTER — Encounter: Payer: Self-pay | Admitting: Physical Therapy

## 2020-03-23 DIAGNOSIS — M25662 Stiffness of left knee, not elsewhere classified: Secondary | ICD-10-CM

## 2020-03-23 DIAGNOSIS — M6281 Muscle weakness (generalized): Secondary | ICD-10-CM

## 2020-03-23 DIAGNOSIS — R262 Difficulty in walking, not elsewhere classified: Secondary | ICD-10-CM

## 2020-03-23 DIAGNOSIS — M25661 Stiffness of right knee, not elsewhere classified: Secondary | ICD-10-CM | POA: Diagnosis not present

## 2020-03-23 NOTE — Therapy (Signed)
[place] [facility] [address] [place], [place], [address] Phone: [phone]   Fax:  [phone]  Physical Therapy Treatment  Patient Details  Name: Cynthia Walker MRN: [id number] Date of Birth: 13-Nov-1934 Referring Provider (PT): Feldpausch   Encounter Date: 03/23/2020   PT End of Session - 03/23/20 1249    Visit Number 11    Number of Visits 24    Date for PT Re-Evaluation 05/03/20    PT Start Time 1105    PT Stop Time 1145    PT Time Calculation (min) 40 min    Equipment Utilized During Treatment Gait belt    Activity Tolerance Patient tolerated treatment well    Behavior During Therapy [facility] for tasks assessed/performed   Highly distractable          Past Medical History:  Diagnosis Date  . Breast cancer [facility]) 2009   Right breast CA with lumpectomy, radiation and chemo tx's.  . Dementia ([place])   . Hypertension   . Personal history of chemotherapy   . Personal history of radiation therapy     Past Surgical History:  Procedure Laterality Date  . BREAST BIOPSY Right 03/15/2008   invasive mammary carcinoma  . BREAST LUMPECTOMY Right 04/14/2008   Pathology revealed a 1.1 cm grade I invasive ductal carcinoma. Margins were negative.   . COLONOSCOPY WITH PROPOFOL N/A 07/10/2015   Procedure: COLONOSCOPY WITH PROPOFOL;  Surgeon: Hulen Luster, MD;  Location: [facility] ENDOSCOPY;  Service: Gastroenterology;  Laterality: N/A;  . INTRAMEDULLARY (IM) NAIL INTERTROCHANTERIC Left 06/27/2019   Procedure: INTRAMEDULLARY (IM) NAIL INTERTROCHANTRIC;  Surgeon: Creig Hines, MD;  Location: [facility] ORS;  Service: Orthopedics;  Laterality: Left;  . TONSILLECTOMY     age of 85 years old    There were no vitals filed for this visit.   Subjective Assessment - 03/23/20 1248    Subjective Patient presents to clinic alert. Patient's spouse notes that their daughter departed yesterday. During hamstring stretches, patient became somnolent and resistant to  participation in standing activities.    Patient is accompained by: Family member   husband, [name]   Pertinent History Femur fracture, left (CMS-HCC) 06/26/2019; Patient was ambulatory without AD prior to the fall that resulted in fx. Patient's spouse notes that she was capable of walking ~0.5 miles prior to the fall. Patient's spouse reports that she can at present stand for up to 10 min, but not consistently and not without assistance. While working with HHPT patient was able to ambulate with a RW for ~ 200 feet with rest breaks throughout.    Limitations Lifting;Standing;Walking;House hold activities    How long can you sit comfortably? Unlimited    How long can you stand comfortably? 5-10 min with support    How long can you walk comfortably? < 200 feet    Patient Stated Goals 2/2 to cognitive state, patient did not state speicifc goals. Spouse would like to see her ambulatory within the house.    Currently in Pain? No/denies            TREATMENT  Neuromuscular Re-education: // bars: Standing posture/balance, BUE support, ModA, 4 x30 sec   Therapeutic Exercise: Seated hamstring stretch, BLE, 2 min x2 each leg for improved ability to stand STS, BUE support, Mod/Max A x3  Patient educated throughout session on appropriate technique and form using multi-modal cueing, HEP, and activity modification. Patient articulated understanding and returned demonstration.  Patient Response to interventions: Patient states "please let  me go home."  ASSESSMENT Patient presents to clinic alert enough to participate in therapy. Patient demonstrates deficits in cognition, posture, BLE strength, gait, balance, and function. Patient disinterested in participation today stating "this is boring" and refusing to participate in activities that are not "exciting" as well as activities that might be considered more "exciting." Patient requiring Mod to Max A for participation and max encouragement to main adequate  arousal for participation. Session ultimately discontinued 2/2 to patient's somnolence and resistance. Patient will benefit from continued skilled therapeutic intervention to address remaining deficits in cognition, posture, BLE strength, gait, balance, and function in order to increase function and improve overall QOL.      PT Long Term Goals - 03/21/20 1230      PT LONG TERM GOAL #1   Title Patient will be modified independent with spousal support with HEP for improved strength and balance in order to decrease fall risk and dependence at home and in the community.    Baseline IE: not demonstrated; 6/15: good cargegiver participation and understanding    Time 12    Period Weeks    Status Achieved    Target Date 05/03/20      PT LONG TERM GOAL #2   Title Patient will demonstrate improved function as evidenced by a score of 44 on FOTO measure for full participation in activities at home and in the community.    Baseline IE: 16; 6/15: deferred 2/2 to technical issues    Time 12    Period Weeks    Status On-going    Target Date 05/03/20      PT LONG TERM GOAL #3   Title Patient will perform STS with BUE support with Modified IND/SBA with VCs for sequencing and safety in order to participate more fully in basic ADLs.    Baseline IE: Mod/MinA, MAX VCs and TCs for sequencing/safety; 6/15: SBA, BUE // bar support, Min/Mod VCs for sequencing    Time 12    Period Weeks    Status On-going    Target Date 05/03/20      PT LONG TERM GOAL #4   Title Patient will demonstrate improved B knee extension ROM as evidenced by lacking < 10 degrees of B knee extension PROM/AROM in order to achieve standing posture with greater independence.    Baseline IE: B lacking 15-20 degrees of knee extension; 6/15: R 0 degrees, L lacking 5 degrees    Time 12    Period Weeks    Status Achieved    Target Date 05/03/20      PT LONG TERM GOAL #5   Title Patient will demonstrate improved static standing balance as  evidenced by ability to maintain standing posture with CGA/SBA, SUE support for 4 minutes in order to participate in basic ADLs.    Baseline IE: 90 sec, MinA, BUE support; 6/15: 2.5 min, CGA, BUE support    Time 12    Period Weeks    Status On-going    Target Date 05/03/20                 Plan - 03/23/20 1249    Clinical Impression Statement Patient presents to clinic alert enough to participate in therapy. Patient demonstrates deficits in cognition, posture, BLE strength, gait, balance, and function. Patient disinterested in participation today stating "this is boring" and refusing to participate in activities that are not "exciting" as well as activities that might be considered more "exciting." Patient requiring Mod to Max A  for participation and max encouragement to main adequate arousal for participation. Session ultimately discontinued 2/2 to patient's somnolence and resistance. Patient will benefit from continued skilled therapeutic intervention to address remaining deficits in cognition, posture, BLE strength, gait, balance, and function in order to increase function and improve overall QOL.    Personal Factors and Comorbidities Age;Education;Comorbidity 3+;Past/Current Experience;Time since onset of injury/illness/exacerbation;Fitness;Transportation;Other   Cognitive State   Comorbidities anemia, breast cancer, chronic constipation, diverticulosis, hypertension, hyperlipidemia, osteoporosis    Examination-Activity Limitations Bathing;Dressing;Transfers;Hygiene/Grooming;Bed Mobility;Squat;Lift;Bend;Locomotion Level;Stairs;Reach Overhead;Stand;Toileting;Carry    Examination-Participation Restrictions Church;Interpersonal Relationship;Personal Finances;Yard Work;Cleaning;Laundry;Community Activity;Medication Management;Shop;Driving;Meal Prep    Stability/Clinical Decision Making Evolving/Moderate complexity    Rehab Potential Fair    PT Frequency 2x / week    PT Duration 12 weeks     PT Treatment/Interventions Moist Heat;DME Instruction;Therapeutic activities;Functional mobility training;Stair training;Gait training;Therapeutic exercise;Balance training;Neuromuscular re-education;Cognitive remediation;Orthotic Fit/Training;Patient/family education;Manual techniques;Scar mobilization;Passive range of motion;Energy conservation;Taping    PT Next Visit Plan Hamstring extensibility, standing tolerance with weight shifts    PT Home Exercise Plan hamstring stretch    Consulted and Agree with Plan of Care Patient;Family member/caregiver    Family Member Consulted Mossie Gilder, husband           Patient will benefit from skilled therapeutic intervention in order to improve the following deficits and impairments:  Abnormal gait, Decreased balance, Decreased endurance, Decreased mobility, Difficulty walking, Hypomobility, Postural dysfunction, Improper body mechanics, Increased edema, Decreased range of motion, Decreased cognition, Decreased activity tolerance, Decreased coordination, Decreased safety awareness, Decreased strength, Impaired flexibility  Visit Diagnosis: Decreased range of motion (ROM) of both knees  Muscle weakness (generalized)  Difficulty in walking, not elsewhere classified     Problem List Patient Active Problem List   Diagnosis Date Noted  . Encounter for hospice care discussion   . MRSA bacteremia 02/17/2020  . Streptococcal bacteremia 02/17/2020  . Bacteremia 02/17/2020  . History of breast cancer 02/17/2020  . Goals of care, counseling/discussion   . Palliative care by specialist   . DNR (do not resuscitate) discussion   . Breast pain, right 01/11/2020  . Femoral neck fracture ([place]) 06/27/2019  . Femur fracture, left ([place]) 06/26/2019  . Right lower lobe pulmonary nodule 05/04/2019  . Syncope 09/27/2017  . Atypical chest pain 09/03/2017  . Essential hypertension 09/03/2017  . Short-term memory loss 09/03/2017  . Osteoporosis 04/02/2016  .  Breast cancer, right [facility]) 04/06/2008   Myles Gip PT, DPT [phone] 03/23/2020, 12:55 PM  [place] [address] [place], [place], [address] Phone: [phone]   Fax:  [phone]  Name: CARLINE DURA MRN: [id number] Date of Birth: September 27, 1935

## 2020-03-28 ENCOUNTER — Ambulatory Visit: Payer: Medicare HMO | Admitting: Physical Therapy

## 2020-03-28 ENCOUNTER — Encounter: Payer: Self-pay | Admitting: Physical Therapy

## 2020-03-28 ENCOUNTER — Other Ambulatory Visit: Payer: Self-pay

## 2020-03-28 DIAGNOSIS — R262 Difficulty in walking, not elsewhere classified: Secondary | ICD-10-CM

## 2020-03-28 DIAGNOSIS — M25661 Stiffness of right knee, not elsewhere classified: Secondary | ICD-10-CM | POA: Diagnosis not present

## 2020-03-28 DIAGNOSIS — M6281 Muscle weakness (generalized): Secondary | ICD-10-CM

## 2020-03-28 NOTE — Therapy (Signed)
[facility] [address]. [place], [place], [address] Phone: [phone]   Fax:  [phone]  Physical Therapy Treatment  Patient Details  Name: Cynthia Walker MRN: [id number] Date of Birth: [birth year] Referring Provider (PT): Feldpausch   Encounter Date: 03/28/2020   PT End of Session - 03/28/20 1420    Visit Number 12    Number of Visits 24    Date for PT Re-Evaluation 05/03/20    PT Start Time 1103    PT Stop Time 1200    PT Time Calculation (min) 57 min    Equipment Utilized During Treatment Gait belt    Activity Tolerance Patient tolerated treatment well    Behavior During Therapy [facility] for tasks assessed/performed   Highly distractable          Past Medical History:  Diagnosis Date  . Breast cancer [facility]) 2009   Right breast CA with lumpectomy, radiation and chemo tx's.  . Dementia ([place])   . Hypertension   . Personal history of chemotherapy   . Personal history of radiation therapy     Past Surgical History:  Procedure Laterality Date  . BREAST BIOPSY Right 03/15/2008   invasive mammary carcinoma  . BREAST LUMPECTOMY Right 04/14/2008   Pathology revealed a 1.1 cm grade I invasive ductal carcinoma. Margins were negative.   . COLONOSCOPY WITH PROPOFOL N/A 07/10/2015   Procedure: COLONOSCOPY WITH PROPOFOL;  Surgeon: Hulen Luster, MD;  Location: [facility] ENDOSCOPY;  Service: Gastroenterology;  Laterality: N/A;  . INTRAMEDULLARY (IM) NAIL INTERTROCHANTERIC Left 06/27/2019   Procedure: INTRAMEDULLARY (IM) NAIL INTERTROCHANTRIC;  Surgeon: Creig Hines, MD;  Location: [facility] ORS;  Service: Orthopedics;  Laterality: Left;  . TONSILLECTOMY     age of 85 years old    There were no vitals filed for this visit.   Subjective Assessment - 03/28/20 1250    Subjective Patient presents to clinic alert. Patient's spouse notes that their daughter departed yesterday. During hamstring stretches, patient became somnolent and resistant to  participation in standing activities.    Patient is accompained by: Family member   husband, [name]   Pertinent History Femur fracture, left (CMS-HCC) 06/26/2019; Patient was ambulatory without AD prior to the fall that resulted in fx. Patient's spouse notes that she was capable of walking ~0.5 miles prior to the fall. Patient's spouse reports that she can at present stand for up to 10 min, but not consistently and not without assistance. While working with HHPT patient was able to ambulate with a RW for ~ 200 feet with rest breaks throughout.    Limitations Lifting;Standing;Walking;House hold activities    How long can you sit comfortably? Unlimited    How long can you stand comfortably? 5-10 min with support    How long can you walk comfortably? < 200 feet    Patient Stated Goals 2/2 to cognitive state, patient did not state speicifc goals. Spouse would like to see her ambulatory within the house.           TREATMENT  Neuromuscular Re-education: // bars: Standing posture/balance, BUE support, SBA, x1.5 min Gait, MinA x1 with w/c follow, BUE support, x4 lengths with consistent encouragement and VCs for sequencing, safety, and attention to task  Therapeutic Exercise: Seated hamstring stretch, BLE, 2 min x3 each leg for improved ability to stand STS x5, BUE support, SBA, min VCs for sequencing  Patient educated throughout session on appropriate technique and form using multi-modal cueing, HEP, and activity modification.  Patient articulated understanding and returned demonstration.  Patient Response to interventions: Patient requesting to leave after 4th bout of walking.  ASSESSMENT Patient presents to clinic alert enough to participate in therapy. Patient demonstrates deficits in cognition, posture, BLE strength, gait, balance, and function. Patient able to walk with less assistance today, but greatly benefits from L hip lateral blocking for pelvic stability in SLS during today's session  and responded positively to active interventions. Patient able to walk for 4 lengths in the // bars with w/c follow, BUE support, and MinA with greatest difficulty controlling pelvic stability in LLE WB. Patient will benefit from continued skilled therapeutic intervention to address remaining deficits in cognition, posture, BLE strength, gait, balance, and function in order to increase function and improve overall QOL.     PT Long Term Goals - 03/21/20 1230      PT LONG TERM GOAL #1   Title Patient will be modified independent with spousal support with HEP for improved strength and balance in order to decrease fall risk and dependence at home and in the community.    Baseline IE: not demonstrated; 6/15: good cargegiver participation and understanding    Time 12    Period Weeks    Status Achieved    Target Date 05/03/20      PT LONG TERM GOAL #2   Title Patient will demonstrate improved function as evidenced by a score of 44 on FOTO measure for full participation in activities at home and in the community.    Baseline IE: 16; 6/15: deferred 2/2 to technical issues    Time 12    Period Weeks    Status On-going    Target Date 05/03/20      PT LONG TERM GOAL #3   Title Patient will perform STS with BUE support with Modified IND/SBA with VCs for sequencing and safety in order to participate more fully in basic ADLs.    Baseline IE: Mod/MinA, MAX VCs and TCs for sequencing/safety; 6/15: SBA, BUE // bar support, Min/Mod VCs for sequencing    Time 12    Period Weeks    Status On-going    Target Date 05/03/20      PT LONG TERM GOAL #4   Title Patient will demonstrate improved B knee extension ROM as evidenced by lacking < 10 degrees of B knee extension PROM/AROM in order to achieve standing posture with greater independence.    Baseline IE: B lacking 15-20 degrees of knee extension; 6/15: R 0 degrees, L lacking 5 degrees    Time 12    Period Weeks    Status Achieved    Target Date 05/03/20       PT LONG TERM GOAL #5   Title Patient will demonstrate improved static standing balance as evidenced by ability to maintain standing posture with CGA/SBA, SUE support for 4 minutes in order to participate in basic ADLs.    Baseline IE: 90 sec, MinA, BUE support; 6/15: 2.5 min, CGA, BUE support    Time 12    Period Weeks    Status On-going    Target Date 05/03/20                 Plan - 03/28/20 1421    Clinical Impression Statement Patient presents to clinic alert enough to participate in therapy. Patient demonstrates deficits in cognition, posture, BLE strength, gait, balance, and function. Patient able to walk with less assistance today, but greatly benefits from L hip lateral blocking for  pelvic stability in SLS during today's session and responded positively to active interventions. Patient able to walk for 4 lengths in the // bars with w/c follow, BUE support, and MinA with greatest difficulty controlling pelvic stability in LLE WB. Patient will benefit from continued skilled therapeutic intervention to address remaining deficits in cognition, posture, BLE strength, gait, balance, and function in order to increase function and improve overall QOL.    Personal Factors and Comorbidities Age;Education;Comorbidity 3+;Past/Current Experience;Time since onset of injury/illness/exacerbation;Fitness;Transportation;Other   Cognitive State   Comorbidities anemia, breast cancer, chronic constipation, diverticulosis, hypertension, hyperlipidemia, osteoporosis    Examination-Activity Limitations Bathing;Dressing;Transfers;Hygiene/Grooming;Bed Mobility;Squat;Lift;Bend;Locomotion Level;Stairs;Reach Overhead;Stand;Toileting;Carry    Examination-Participation Restrictions Church;Interpersonal Relationship;Personal Finances;Yard Work;Cleaning;Laundry;Community Activity;Medication Management;Shop;Driving;Meal Prep    Stability/Clinical Decision Making Evolving/Moderate complexity    Rehab Potential  Fair    PT Frequency 2x / week    PT Duration 12 weeks    PT Treatment/Interventions Moist Heat;DME Instruction;Therapeutic activities;Functional mobility training;Stair training;Gait training;Therapeutic exercise;Balance training;Neuromuscular re-education;Cognitive remediation;Orthotic Fit/Training;Patient/family education;Manual techniques;Scar mobilization;Passive range of motion;Energy conservation;Taping    PT Next Visit Plan Hamstring extensibility, standing tolerance with weight shifts    PT Home Exercise Plan hamstring stretch    Consulted and Agree with Plan of Care Patient;Family member/caregiver    Family Member Consulted Saundra Gin, husband           Patient will benefit from skilled therapeutic intervention in order to improve the following deficits and impairments:  Abnormal gait, Decreased balance, Decreased endurance, Decreased mobility, Difficulty walking, Hypomobility, Postural dysfunction, Improper body mechanics, Increased edema, Decreased range of motion, Decreased cognition, Decreased activity tolerance, Decreased coordination, Decreased safety awareness, Decreased strength, Impaired flexibility  Visit Diagnosis: Decreased range of motion (ROM) of both knees  Muscle weakness (generalized)  Difficulty in walking, not elsewhere classified     Problem List Patient Active Problem List   Diagnosis Date Noted  . Encounter for hospice care discussion   . MRSA bacteremia 02/17/2020  . Streptococcal bacteremia 02/17/2020  . Bacteremia 02/17/2020  . History of breast cancer 02/17/2020  . Goals of care, counseling/discussion   . Palliative care by specialist   . DNR (do not resuscitate) discussion   . Breast pain, right 01/11/2020  . Femoral neck fracture ([place]) 06/27/2019  . Femur fracture, left ([place]) 06/26/2019  . Right lower lobe pulmonary nodule 05/04/2019  . Syncope 09/27/2017  . Atypical chest pain 09/03/2017  . Essential hypertension 09/03/2017  .  Short-term memory loss 09/03/2017  . Osteoporosis 04/02/2016  . Breast cancer, right [facility] [place]) 04/06/2008   Myles Gip PT, DPT [phone] 03/28/2020, 2:25 PM  [place] [facility] [address] [place], [place], [address] Phone: [phone]   Fax:  [phone]  Name: CARREEN MILIUS MRN: [id number] Date of Birth: [birth year]

## 2020-03-30 ENCOUNTER — Encounter: Payer: Self-pay | Admitting: Physical Therapy

## 2020-03-30 ENCOUNTER — Ambulatory Visit: Payer: Medicare HMO | Admitting: Physical Therapy

## 2020-03-30 ENCOUNTER — Other Ambulatory Visit: Payer: Self-pay

## 2020-03-30 DIAGNOSIS — R262 Difficulty in walking, not elsewhere classified: Secondary | ICD-10-CM

## 2020-03-30 DIAGNOSIS — M25661 Stiffness of right knee, not elsewhere classified: Secondary | ICD-10-CM

## 2020-03-30 DIAGNOSIS — M6281 Muscle weakness (generalized): Secondary | ICD-10-CM

## 2020-03-30 NOTE — Therapy (Signed)
[place] [facility] [address] [place], [place], [address] Phone: [phone]   Fax:  [phone]  Physical Therapy Treatment  Patient Details  Name: Cynthia Walker MRN: [id number] Date of Birth: 1935-01-15 Referring Provider (PT): Feldpausch   Encounter Date: 03/30/2020   PT End of Session - 03/30/20 1235    Visit Number 13    Number of Visits 24    Date for PT Re-Evaluation 05/03/20    PT Start Time 1125    PT Stop Time 1205    PT Time Calculation (min) 40 min    Equipment Utilized During Treatment Gait belt    Activity Tolerance Patient tolerated treatment well    Behavior During Therapy [facility] for tasks assessed/performed   Highly distractable          Past Medical History:  Diagnosis Date  . Breast cancer [facility]) 2009   Right breast CA with lumpectomy, radiation and chemo tx's.  . Dementia ([place])   . Hypertension   . Personal history of chemotherapy   . Personal history of radiation therapy     Past Surgical History:  Procedure Laterality Date  . BREAST BIOPSY Right 03/15/2008   invasive mammary carcinoma  . BREAST LUMPECTOMY Right 04/14/2008   Pathology revealed a 1.1 cm grade I invasive ductal carcinoma. Margins were negative.   . COLONOSCOPY WITH PROPOFOL N/A 07/10/2015   Procedure: COLONOSCOPY WITH PROPOFOL;  Surgeon: Hulen Luster, MD;  Location: [facility] ENDOSCOPY;  Service: Gastroenterology;  Laterality: N/A;  . INTRAMEDULLARY (IM) NAIL INTERTROCHANTERIC Left 06/27/2019   Procedure: INTRAMEDULLARY (IM) NAIL INTERTROCHANTRIC;  Surgeon: Creig Hines, MD;  Location: [facility] ORS;  Service: Orthopedics;  Laterality: Left;  . TONSILLECTOMY     age of 85 years old    There were no vitals filed for this visit.   Subjective Assessment - 03/30/20 1233    Subjective Patient arrives to clinic ~25 minutes late for appointment due to increased drowsiness and low level of arousal today. Patient and spouse deny any significant changes  since last session. Spouse notes that hired caregiver has been doing seated hamstring stretch with patient.    Patient is accompained by: Family member   husband, [name]   Pertinent History Femur fracture, left (CMS-HCC) 06/26/2019; Patient was ambulatory without AD prior to the fall that resulted in fx. Patient's spouse notes that she was capable of walking ~0.5 miles prior to the fall. Patient's spouse reports that she can at present stand for up to 10 min, but not consistently and not without assistance. While working with HHPT patient was able to ambulate with a RW for ~ 200 feet with rest breaks throughout.    Limitations Lifting;Standing;Walking;House hold activities    How long can you sit comfortably? Unlimited    How long can you stand comfortably? 5-10 min with support    How long can you walk comfortably? < 200 feet    Patient Stated Goals 2/2 to cognitive state, patient did not state speicifc goals. Spouse would like to see her ambulatory within the house.    Currently in Pain? No/denies           TREATMENT  Neuromuscular Re-education: // bars: Standing posture/balance, BUE support, SBA, x1.5 min Gait, Min/ModA x1 with w/c follow, BUE support, x2 lengths with consistent encouragement and VCs for sequencing, safety, and attention to task  Therapeutic Exercise: Seated hamstring stretch, BLE, 2 min x2 each leg for improved ability to stand  Patient educated  throughout session on appropriate technique and form using multi-modal cueing, HEP, and activity modification. Patient articulated understanding and returned demonstration.  Patient Response to interventions: Patient states "I'm ready to leave" after second bout of walking.  ASSESSMENT Patient presents to clinic alert enough to participate in therapy. Patient demonstrates deficits in cognition, posture, BLE strength, gait, balance, and function. Patient able to walk with less L hip block today but requiring max encouragement  for participation during today's session and responded positively to active interventions.  Patient will benefit from continued skilled therapeutic intervention to address remaining deficits in cognition, posture, BLE strength, gait, balance, and function in order to increase function and improve overall QOL.     PT Long Term Goals - 03/21/20 1230      PT LONG TERM GOAL #1   Title Patient will be modified independent with spousal support with HEP for improved strength and balance in order to decrease fall risk and dependence at home and in the community.    Baseline IE: not demonstrated; 6/15: good cargegiver participation and understanding    Time 12    Period Weeks    Status Achieved    Target Date 05/03/20      PT LONG TERM GOAL #2   Title Patient will demonstrate improved function as evidenced by a score of 44 on FOTO measure for full participation in activities at home and in the community.    Baseline IE: 16; 6/15: deferred 2/2 to technical issues    Time 12    Period Weeks    Status On-going    Target Date 05/03/20      PT LONG TERM GOAL #3   Title Patient will perform STS with BUE support with Modified IND/SBA with VCs for sequencing and safety in order to participate more fully in basic ADLs.    Baseline IE: Mod/MinA, MAX VCs and TCs for sequencing/safety; 6/15: SBA, BUE // bar support, Min/Mod VCs for sequencing    Time 12    Period Weeks    Status On-going    Target Date 05/03/20      PT LONG TERM GOAL #4   Title Patient will demonstrate improved B knee extension ROM as evidenced by lacking < 10 degrees of B knee extension PROM/AROM in order to achieve standing posture with greater independence.    Baseline IE: B lacking 15-20 degrees of knee extension; 6/15: R 0 degrees, L lacking 5 degrees    Time 12    Period Weeks    Status Achieved    Target Date 05/03/20      PT LONG TERM GOAL #5   Title Patient will demonstrate improved static standing balance as evidenced by  ability to maintain standing posture with CGA/SBA, SUE support for 4 minutes in order to participate in basic ADLs.    Baseline IE: 90 sec, MinA, BUE support; 6/15: 2.5 min, CGA, BUE support    Time 12    Period Weeks    Status On-going    Target Date 05/03/20                 Plan - 03/30/20 1235    Clinical Impression Statement Patient presents to clinic alert enough to participate in therapy. Patient demonstrates deficits in cognition, posture, BLE strength, gait, balance, and function. Patient able to walk with less L hip block today but requiring max encouragement for participation during today's session and responded positively to active interventions.  Patient will benefit from continued skilled  therapeutic intervention to address remaining deficits in cognition, posture, BLE strength, gait, balance, and function in order to increase function and improve overall QOL.    Personal Factors and Comorbidities Age;Education;Comorbidity 3+;Past/Current Experience;Time since onset of injury/illness/exacerbation;Fitness;Transportation;Other   Cognitive State   Comorbidities anemia, breast cancer, chronic constipation, diverticulosis, hypertension, hyperlipidemia, osteoporosis    Examination-Activity Limitations Bathing;Dressing;Transfers;Hygiene/Grooming;Bed Mobility;Squat;Lift;Bend;Locomotion Level;Stairs;Reach Overhead;Stand;Toileting;Carry    Examination-Participation Restrictions Church;Interpersonal Relationship;Personal Finances;Yard Work;Cleaning;Laundry;Community Activity;Medication Management;Shop;Driving;Meal Prep    Stability/Clinical Decision Making Evolving/Moderate complexity    Rehab Potential Fair    PT Frequency 2x / week    PT Duration 12 weeks    PT Treatment/Interventions Moist Heat;DME Instruction;Therapeutic activities;Functional mobility training;Stair training;Gait training;Therapeutic exercise;Balance training;Neuromuscular re-education;Cognitive remediation;Orthotic  Fit/Training;Patient/family education;Manual techniques;Scar mobilization;Passive range of motion;Energy conservation;Taping    PT Next Visit Plan Hamstring extensibility, standing tolerance with weight shifts    PT Home Exercise Plan hamstring stretch    Consulted and Agree with Plan of Care Patient;Family member/caregiver    Family Member Consulted Xian Apostol, husband           Patient will benefit from skilled therapeutic intervention in order to improve the following deficits and impairments:  Abnormal gait, Decreased balance, Decreased endurance, Decreased mobility, Difficulty walking, Hypomobility, Postural dysfunction, Improper body mechanics, Increased edema, Decreased range of motion, Decreased cognition, Decreased activity tolerance, Decreased coordination, Decreased safety awareness, Decreased strength, Impaired flexibility  Visit Diagnosis: Decreased range of motion (ROM) of both knees  Muscle weakness (generalized)  Difficulty in walking, not elsewhere classified     Problem List Patient Active Problem List   Diagnosis Date Noted  . Encounter for hospice care discussion   . MRSA bacteremia 02/17/2020  . Streptococcal bacteremia 02/17/2020  . Bacteremia 02/17/2020  . History of breast cancer 02/17/2020  . Goals of care, counseling/discussion   . Palliative care by specialist   . DNR (do not resuscitate) discussion   . Breast pain, right 01/11/2020  . Femoral neck fracture ([place]) 06/27/2019  . Femur fracture, left ([place]) 06/26/2019  . Right lower lobe pulmonary nodule 05/04/2019  . Syncope 09/27/2017  . Atypical chest pain 09/03/2017  . Essential hypertension 09/03/2017  . Short-term memory loss 09/03/2017  . Osteoporosis 04/02/2016  . Breast cancer, right [facility]) 04/06/2008   Myles Gip PT, DPT [phone] 03/30/2020, 12:38 PM  [facility] [address] [place], [place], [address] Phone: [phone]   Fax:   [phone]  Name: SHRIYA AKER MRN: [id number] Date of Birth: 1934-11-19

## 2020-04-04 ENCOUNTER — Encounter: Payer: Self-pay | Admitting: Physical Therapy

## 2020-04-04 ENCOUNTER — Other Ambulatory Visit: Payer: Self-pay

## 2020-04-04 ENCOUNTER — Ambulatory Visit: Payer: Medicare HMO | Admitting: Physical Therapy

## 2020-04-04 DIAGNOSIS — M25661 Stiffness of right knee, not elsewhere classified: Secondary | ICD-10-CM

## 2020-04-04 DIAGNOSIS — R262 Difficulty in walking, not elsewhere classified: Secondary | ICD-10-CM

## 2020-04-04 DIAGNOSIS — M6281 Muscle weakness (generalized): Secondary | ICD-10-CM

## 2020-04-04 NOTE — Therapy (Signed)
[facility] [address] [place], [place], [address] Phone: [phone]   Fax:  [phone]  Physical Therapy Treatment  Patient Details  Name: Cynthia Walker MRN: [id number] Date of Birth: 1934/11/15 Referring Provider (PT): Feldpausch   Encounter Date: 04/04/2020   PT End of Session - 04/04/20 1307    Visit Number 14    Number of Visits 24    Date for PT Re-Evaluation 05/03/20    PT Start Time 1110    PT Stop Time 1205    PT Time Calculation (min) 55 min    Equipment Utilized During Treatment Gait belt    Activity Tolerance Patient tolerated treatment well    Behavior During Therapy [facility] for tasks assessed/performed   Highly distractable          Past Medical History:  Diagnosis Date   Breast cancer [facility]) 2009   Right breast CA with lumpectomy, radiation and chemo tx's.   Dementia ([place])    Hypertension    Personal history of chemotherapy    Personal history of radiation therapy     Past Surgical History:  Procedure Laterality Date   BREAST BIOPSY Right 03/15/2008   invasive mammary carcinoma   BREAST LUMPECTOMY Right 04/14/2008   Pathology revealed a 1.1 cm grade I invasive ductal carcinoma. Margins were negative.    COLONOSCOPY WITH PROPOFOL N/A 07/10/2015   Procedure: COLONOSCOPY WITH PROPOFOL;  Surgeon: Hulen Luster, MD;  Location: [facility] ENDOSCOPY;  Service: Gastroenterology;  Laterality: N/A;   INTRAMEDULLARY (IM) NAIL INTERTROCHANTERIC Left 06/27/2019   Procedure: INTRAMEDULLARY (IM) NAIL INTERTROCHANTRIC;  Surgeon: Creig Hines, MD;  Location: [facility] ORS;  Service: Orthopedics;  Laterality: Left;   TONSILLECTOMY     age of 85 years old    There were no vitals filed for this visit.   Subjective Assessment - 04/04/20 1305    Subjective Patient arriving 10 minutes late to appointment due to issues with w/c van. Patient's spouse notes that patient had good breakfast and is alert today. Patient's  spouse also notes that patient has been grimacing and moaning as i fin pain with bed transfers and bed mobility as well as at times just saying "ouch" repeatedly while sleeping.    Patient is accompained by: Family member   husband, [name]   Pertinent History Femur fracture, left (CMS-HCC) 06/26/2019; Patient was ambulatory without AD prior to the fall that resulted in fx. Patient's spouse notes that she was capable of walking ~0.5 miles prior to the fall. Patient's spouse reports that she can at present stand for up to 10 min, but not consistently and not without assistance. While working with HHPT patient was able to ambulate with a RW for ~ 200 feet with rest breaks throughout.    Limitations Lifting;Standing;Walking;House hold activities    How long can you sit comfortably? Unlimited    How long can you stand comfortably? 5-10 min with support    How long can you walk comfortably? < 200 feet    Patient Stated Goals 2/2 to cognitive state, patient did not state speicifc goals. Spouse would like to see her ambulatory within the house.    Currently in Pain? No/denies           TREATMENT  Neuromuscular Re-education: // bars: Gait, Min/ModA x1 with w/c follow, BUE support, x2 lengths with consistent encouragement and VCs for sequencing, safety, and attention to task  Therapeutic Exercise: Seated hamstring stretch, BLE, 2 min x2 each leg  for improved ability to stand Seated hip abduction isometrics, BLE, 5 sec hold x10 for improved pelvic control in SLS  Patient educated throughout session on appropriate technique and form using multi-modal cueing, HEP, and activity modification. Patient articulated understanding and returned demonstration.  Patient Response to interventions: Patient denies pain.  ASSESSMENT Patient presents to clinic alert enough to participate in therapy. Patient demonstrates deficits in cognition, posture, BLE strength, gait, balance, and function. Patient continues to  lack motivation for participation in PT but did demonstrate improving gait mechanics on second trial during today's session and responded positively to active interventions.  Patient will benefit from continued skilled therapeutic intervention to address remaining deficits in cognition, posture, BLE strength, gait, balance, and function in order to increase function and improve overall QOL.      PT Long Term Goals - 03/21/20 1230      PT LONG TERM GOAL #1   Title Patient will be modified independent with spousal support with HEP for improved strength and balance in order to decrease fall risk and dependence at home and in the community.    Baseline IE: not demonstrated; 6/15: good cargegiver participation and understanding    Time 12    Period Weeks    Status Achieved    Target Date 05/03/20      PT LONG TERM GOAL #2   Title Patient will demonstrate improved function as evidenced by a score of 44 on FOTO measure for full participation in activities at home and in the community.    Baseline IE: 16; 6/15: deferred 2/2 to technical issues    Time 12    Period Weeks    Status On-going    Target Date 05/03/20      PT LONG TERM GOAL #3   Title Patient will perform STS with BUE support with Modified IND/SBA with VCs for sequencing and safety in order to participate more fully in basic ADLs.    Baseline IE: Mod/MinA, MAX VCs and TCs for sequencing/safety; 6/15: SBA, BUE // bar support, Min/Mod VCs for sequencing    Time 12    Period Weeks    Status On-going    Target Date 05/03/20      PT LONG TERM GOAL #4   Title Patient will demonstrate improved B knee extension ROM as evidenced by lacking < 10 degrees of B knee extension PROM/AROM in order to achieve standing posture with greater independence.    Baseline IE: B lacking 15-20 degrees of knee extension; 6/15: R 0 degrees, L lacking 5 degrees    Time 12    Period Weeks    Status Achieved    Target Date 05/03/20      PT LONG TERM GOAL  #5   Title Patient will demonstrate improved static standing balance as evidenced by ability to maintain standing posture with CGA/SBA, SUE support for 4 minutes in order to participate in basic ADLs.    Baseline IE: 90 sec, MinA, BUE support; 6/15: 2.5 min, CGA, BUE support    Time 12    Period Weeks    Status On-going    Target Date 05/03/20                 Plan - 04/04/20 1307    Clinical Impression Statement Patient presents to clinic alert enough to participate in therapy. Patient demonstrates deficits in cognition, posture, BLE strength, gait, balance, and function. Patient continues to lack motivation for participation in PT but did demonstrate improving gait  mechanics on second trial during today's session and responded positively to active interventions.  Patient will benefit from continued skilled therapeutic intervention to address remaining deficits in cognition, posture, BLE strength, gait, balance, and function in order to increase function and improve overall QOL.    Personal Factors and Comorbidities Age;Education;Comorbidity 3+;Past/Current Experience;Time since onset of injury/illness/exacerbation;Fitness;Transportation;Other   Cognitive State   Comorbidities anemia, breast cancer, chronic constipation, diverticulosis, hypertension, hyperlipidemia, osteoporosis    Examination-Activity Limitations Bathing;Dressing;Transfers;Hygiene/Grooming;Bed Mobility;Squat;Lift;Bend;Locomotion Level;Stairs;Reach Overhead;Stand;Toileting;Carry    Examination-Participation Restrictions Church;Interpersonal Relationship;Personal Finances;Yard Work;Cleaning;Laundry;Community Activity;Medication Management;Shop;Driving;Meal Prep    Stability/Clinical Decision Making Evolving/Moderate complexity    Rehab Potential Fair    PT Frequency 2x / week    PT Duration 12 weeks    PT Treatment/Interventions Moist Heat;DME Instruction;Therapeutic activities;Functional mobility training;Stair  training;Gait training;Therapeutic exercise;Balance training;Neuromuscular re-education;Cognitive remediation;Orthotic Fit/Training;Patient/family education;Manual techniques;Scar mobilization;Passive range of motion;Energy conservation;Taping    PT Next Visit Plan gait in // bars    PT Home Exercise Plan hamstring stretch    Consulted and Agree with Plan of Care Patient;Family member/caregiver    Family Member Consulted Gissel Keilman, husband           Patient will benefit from skilled therapeutic intervention in order to improve the following deficits and impairments:  Abnormal gait, Decreased balance, Decreased endurance, Decreased mobility, Difficulty walking, Hypomobility, Postural dysfunction, Improper body mechanics, Increased edema, Decreased range of motion, Decreased cognition, Decreased activity tolerance, Decreased coordination, Decreased safety awareness, Decreased strength, Impaired flexibility  Visit Diagnosis: Decreased range of motion (ROM) of both knees  Muscle weakness (generalized)  Difficulty in walking, not elsewhere classified     Problem List Patient Active Problem List   Diagnosis Date Noted   Encounter for hospice care discussion    MRSA bacteremia 02/17/2020   Streptococcal bacteremia 02/17/2020   Bacteremia 02/17/2020   History of breast cancer 02/17/2020   Goals of care, counseling/discussion    Palliative care by specialist    DNR (do not resuscitate) discussion    Breast pain, right 01/11/2020   Femoral neck fracture ([place]) 06/27/2019   Femur fracture, left ([place]) 06/26/2019   Right lower lobe pulmonary nodule 05/04/2019   Syncope 09/27/2017   Atypical chest pain 09/03/2017   Essential hypertension 09/03/2017   Short-term memory loss 09/03/2017   Osteoporosis 04/02/2016   Breast cancer, right ([place]) 04/06/2008   Myles Gip PT, DPT [phone] 04/04/2020, 1:14 PM  [place] [facility] [address] [place], [place], [address] Phone: [phone]   Fax:  [phone]  Name: Cynthia Walker MRN: [id number] Date of Birth: 06/19/1935

## 2020-04-06 ENCOUNTER — Other Ambulatory Visit: Payer: Self-pay

## 2020-04-06 ENCOUNTER — Ambulatory Visit: Payer: Medicare HMO | Attending: Family Medicine | Admitting: Physical Therapy

## 2020-04-06 DIAGNOSIS — M6281 Muscle weakness (generalized): Secondary | ICD-10-CM

## 2020-04-06 DIAGNOSIS — M25662 Stiffness of left knee, not elsewhere classified: Secondary | ICD-10-CM | POA: Insufficient documentation

## 2020-04-06 DIAGNOSIS — M25661 Stiffness of right knee, not elsewhere classified: Secondary | ICD-10-CM | POA: Diagnosis not present

## 2020-04-06 DIAGNOSIS — R262 Difficulty in walking, not elsewhere classified: Secondary | ICD-10-CM | POA: Diagnosis present

## 2020-04-06 NOTE — Therapy (Signed)
[facility] [address] [place], [place], [address] Phone: [phone]   Fax:  [phone]  Physical Therapy Treatment  Patient Details  Name: Cynthia Walker MRN: [id number] Date of Birth: 26-Feb-1935 Referring Provider (PT): Feldpausch   Encounter Date: 04/06/2020   PT End of Session - 04/06/20 1237    Visit Number 15    Number of Visits 24    Date for PT Re-Evaluation 05/03/20    PT Start Time 1100    PT Stop Time 1145    PT Time Calculation (min) 45 min    Equipment Utilized During Treatment Gait belt    Activity Tolerance Patient tolerated treatment well;Patient limited by fatigue;Other (comment)   Tx limited 2/2 to attention span   Behavior During Therapy [facility] for tasks assessed/performed   Highly distractable          Past Medical History:  Diagnosis Date  . Breast cancer [facility]) 2009   Right breast CA with lumpectomy, radiation and chemo tx's.  . Dementia ([place])   . Hypertension   . Personal history of chemotherapy   . Personal history of radiation therapy     Past Surgical History:  Procedure Laterality Date  . BREAST BIOPSY Right 03/15/2008   invasive mammary carcinoma  . BREAST LUMPECTOMY Right 04/14/2008   Pathology revealed a 1.1 cm grade I invasive ductal carcinoma. Margins were negative.   . COLONOSCOPY WITH PROPOFOL N/A 07/10/2015   Procedure: COLONOSCOPY WITH PROPOFOL;  Surgeon: Hulen Luster, MD;  Location: [facility] ENDOSCOPY;  Service: Gastroenterology;  Laterality: N/A;  . INTRAMEDULLARY (IM) NAIL INTERTROCHANTERIC Left 06/27/2019   Procedure: INTRAMEDULLARY (IM) NAIL INTERTROCHANTRIC;  Surgeon: Creig Hines, MD;  Location: [facility] ORS;  Service: Orthopedics;  Laterality: Left;  . TONSILLECTOMY     age of 85 years old    There were no vitals filed for this visit.   Subjective Assessment - 04/06/20 1235    Subjective Patient arrives to clinic quite somnolent. Patient's spouse reports a fitful night of  sleep with 2-3 hours of the patient thrashing about and disoriented. He feels that by giving her quetiapine with some maple syrup to mask the bitter flavor, he may have affected the efficacy of the drug. He also notes that it might be possible that she spit out the quetiapine.    Patient is accompained by: Family member   husband, [name]   Pertinent History Femur fracture, left (CMS-HCC) 06/26/2019; Patient was ambulatory without AD prior to the fall that resulted in fx. Patient's spouse notes that she was capable of walking ~0.5 miles prior to the fall. Patient's spouse reports that she can at present stand for up to 10 min, but not consistently and not without assistance. While working with HHPT patient was able to ambulate with a RW for ~ 200 feet with rest breaks throughout.    Limitations Lifting;Standing;Walking;House hold activities    How long can you sit comfortably? Unlimited    How long can you stand comfortably? 5-10 min with support    How long can you walk comfortably? < 200 feet    Patient Stated Goals 2/2 to cognitive state, patient did not state speicifc goals. Spouse would like to see her ambulatory within the house.    Currently in Pain? No/denies           TREATMENT  Neuromuscular Re-education: // bars: STS, BUE support, SBA to ModA, x3 with MAX encouragement and reorientation to task Gait, ModA x1  with w/c follow, BUE support, x1 lengths with MAX encouragement and VCs for sequencing, safety, and attention to task  Therapeutic Exercise: Seated hamstring stretch, BLE, 2 min x2 each leg for improved ability to stand Seated hip abduction isometrics, BLE, 5 sec hold x10 for improved pelvic control in SLS  Patient educated throughout session on appropriate technique and form using multi-modal cueing, HEP, and activity modification. Patient articulated understanding and returned demonstration.  Patient Response to interventions: "I'm done."  ASSESSMENT Patient presents to  clinic drowsy but amenable to therapy. Patient demonstrates deficits in cognition, posture, BLE strength, gait, balance, and function. Patient's required level of assistance appears to be largely dependent on her level of cognition. Patient was A&O to self only during today's session and unable to answer open-ended questions, inconsistently able to answer yes/no questions, and unable to negotiate choice between two answers.  Patient will benefit from continued skilled therapeutic intervention to address deficits in cognition, posture, BLE strength, gait, balance, and function in order to increase function and improve overall QOL.     PT Long Term Goals - 03/21/20 1230      PT LONG TERM GOAL #1   Title Patient will be modified independent with spousal support with HEP for improved strength and balance in order to decrease fall risk and dependence at home and in the community.    Baseline IE: not demonstrated; 6/15: good cargegiver participation and understanding    Time 12    Period Weeks    Status Achieved    Target Date 05/03/20      PT LONG TERM GOAL #2   Title Patient will demonstrate improved function as evidenced by a score of 44 on FOTO measure for full participation in activities at home and in the community.    Baseline IE: 16; 6/15: deferred 2/2 to technical issues    Time 12    Period Weeks    Status On-going    Target Date 05/03/20      PT LONG TERM GOAL #3   Title Patient will perform STS with BUE support with Modified IND/SBA with VCs for sequencing and safety in order to participate more fully in basic ADLs.    Baseline IE: Mod/MinA, MAX VCs and TCs for sequencing/safety; 6/15: SBA, BUE // bar support, Min/Mod VCs for sequencing    Time 12    Period Weeks    Status On-going    Target Date 05/03/20      PT LONG TERM GOAL #4   Title Patient will demonstrate improved B knee extension ROM as evidenced by lacking < 10 degrees of B knee extension PROM/AROM in order to achieve  standing posture with greater independence.    Baseline IE: B lacking 15-20 degrees of knee extension; 6/15: R 0 degrees, L lacking 5 degrees    Time 12    Period Weeks    Status Achieved    Target Date 05/03/20      PT LONG TERM GOAL #5   Title Patient will demonstrate improved static standing balance as evidenced by ability to maintain standing posture with CGA/SBA, SUE support for 4 minutes in order to participate in basic ADLs.    Baseline IE: 90 sec, MinA, BUE support; 6/15: 2.5 min, CGA, BUE support    Time 12    Period Weeks    Status On-going    Target Date 05/03/20                 Plan -  04/06/20 1240    Clinical Impression Statement Patient presents to clinic drowsy but amenable to therapy. Patient demonstrates deficits in cognition, posture, BLE strength, gait, balance, and function. Patient's required level of assistance appears to be largely dependent on her level of cognition. Patient was A&O to self only during today's session and unable to answer open-ended questions, inconsistently able to answer yes/no questions, and unable to negotiate choice between two answers.  Patient will benefit from continued skilled therapeutic intervention to address deficits in cognition, posture, BLE strength, gait, balance, and function in order to increase function and improve overall QOL.    Personal Factors and Comorbidities Age;Education;Comorbidity 3+;Past/Current Experience;Time since onset of injury/illness/exacerbation;Fitness;Transportation;Other   Cognitive State   Comorbidities anemia, breast cancer, chronic constipation, diverticulosis, hypertension, hyperlipidemia, osteoporosis    Examination-Activity Limitations Bathing;Dressing;Transfers;Hygiene/Grooming;Bed Mobility;Squat;Lift;Bend;Locomotion Level;Stairs;Reach Overhead;Stand;Toileting;Carry    Examination-Participation Restrictions Church;Interpersonal Relationship;Personal Finances;Yard Work;Cleaning;Laundry;Community  Activity;Medication Management;Shop;Driving;Meal Prep    Stability/Clinical Decision Making Evolving/Moderate complexity    Rehab Potential Fair    PT Frequency 2x / week    PT Duration 12 weeks    PT Treatment/Interventions Moist Heat;DME Instruction;Therapeutic activities;Functional mobility training;Stair training;Gait training;Therapeutic exercise;Balance training;Neuromuscular re-education;Cognitive remediation;Orthotic Fit/Training;Patient/family education;Manual techniques;Scar mobilization;Passive range of motion;Energy conservation;Taping    PT Next Visit Plan gait in // bars    PT Home Exercise Plan hamstring stretch    Consulted and Agree with Plan of Care Patient;Family member/caregiver    Family Member Consulted Briget Shaheed, husband           Patient will benefit from skilled therapeutic intervention in order to improve the following deficits and impairments:  Abnormal gait, Decreased balance, Decreased endurance, Decreased mobility, Difficulty walking, Hypomobility, Postural dysfunction, Improper body mechanics, Increased edema, Decreased range of motion, Decreased cognition, Decreased activity tolerance, Decreased coordination, Decreased safety awareness, Decreased strength, Impaired flexibility  Visit Diagnosis: Decreased range of motion (ROM) of both knees  Muscle weakness (generalized)  Difficulty in walking, not elsewhere classified     Problem List Patient Active Problem List   Diagnosis Date Noted  . Encounter for hospice care discussion   . MRSA bacteremia 02/17/2020  . Streptococcal bacteremia 02/17/2020  . Bacteremia 02/17/2020  . History of breast cancer 02/17/2020  . Goals of care, counseling/discussion   . Palliative care by specialist   . DNR (do not resuscitate) discussion   . Breast pain, right 01/11/2020  . Femoral neck fracture ([place]) 06/27/2019  . Femur fracture, left ([place]) 06/26/2019  . Right lower lobe pulmonary nodule 05/04/2019  .  Syncope 09/27/2017  . Atypical chest pain 09/03/2017  . Essential hypertension 09/03/2017  . Short-term memory loss 09/03/2017  . Osteoporosis 04/02/2016  . Breast cancer, right [facility]) 04/06/2008   Myles Gip PT, DPT [phone] 04/06/2020, 12:46 PM  [place] [facility] [address] [place], [place], [address] Phone: [phone]   Fax:  [phone]  Name: Cynthia Walker MRN: [id number] Date of Birth: 1935/03/26

## 2020-04-11 ENCOUNTER — Other Ambulatory Visit: Payer: Self-pay

## 2020-04-11 ENCOUNTER — Encounter: Payer: Self-pay | Admitting: Physical Therapy

## 2020-04-11 ENCOUNTER — Ambulatory Visit: Payer: Medicare HMO | Admitting: Physical Therapy

## 2020-04-11 DIAGNOSIS — M6281 Muscle weakness (generalized): Secondary | ICD-10-CM

## 2020-04-11 DIAGNOSIS — R262 Difficulty in walking, not elsewhere classified: Secondary | ICD-10-CM

## 2020-04-11 DIAGNOSIS — M25661 Stiffness of right knee, not elsewhere classified: Secondary | ICD-10-CM | POA: Diagnosis not present

## 2020-04-11 NOTE — Therapy (Signed)
[facility], [place] [address]. [place], [place], [address] Phone: [phone]   Fax:  [phone]  Physical Therapy Treatment  Patient Details  Name: Cynthia Walker MRN: [id number] Date of Birth: 1935/07/11 Referring Provider (PT): Feldpausch   Encounter Date: 04/11/2020   PT End of Session - 04/11/20 1245    Visit Number 16    Number of Visits 24    Date for PT Re-Evaluation 05/03/20    PT Start Time 1110    PT Stop Time 1205    PT Time Calculation (min) 55 min    Equipment Utilized During Treatment Gait belt    Activity Tolerance Patient tolerated treatment well;Other (comment)   Tx limited 2/2 to attention span   Behavior During Therapy [facility] for tasks assessed/performed   Highly distractable          Past Medical History:  Diagnosis Date   Breast cancer ([place]) 2009   Right breast CA with lumpectomy, radiation and chemo tx's.   Dementia ([place])    Hypertension    Personal history of chemotherapy    Personal history of radiation therapy     Past Surgical History:  Procedure Laterality Date   BREAST BIOPSY Right 03/15/2008   invasive mammary carcinoma   BREAST LUMPECTOMY Right 04/14/2008   Pathology revealed a 1.1 cm grade I invasive ductal carcinoma. Margins were negative.    COLONOSCOPY WITH PROPOFOL N/A 07/10/2015   Procedure: COLONOSCOPY WITH PROPOFOL;  Surgeon: Hulen Luster, MD;  Location: [facility] ENDOSCOPY;  Service: Gastroenterology;  Laterality: N/A;   INTRAMEDULLARY (IM) NAIL INTERTROCHANTERIC Left 06/27/2019   Procedure: INTRAMEDULLARY (IM) NAIL INTERTROCHANTRIC;  Surgeon: Creig Hines, MD;  Location: [facility] ORS;  Service: Orthopedics;  Laterality: Left;   TONSILLECTOMY     age of 85 years old    There were no vitals filed for this visit.   Subjective Assessment - 04/11/20 1244    Subjective Patient presents to clinic 10 minutes late today but is alert and participatory. Patient notes she had a nice  weekend. Patient's spouse adds that they are still waiting to hear the results of the urine culture performed last week.    Patient is accompained by: Family member   husband, Cynthia Walker   Pertinent History Femur fracture, left (CMS-HCC) 06/26/2019; Patient was ambulatory without AD prior to the fall that resulted in fx. Patient's spouse notes that she was capable of walking ~0.5 miles prior to the fall. Patient's spouse reports that she can at present stand for up to 10 min, but not consistently and not without assistance. While working with HHPT patient was able to ambulate with a RW for ~ 200 feet with rest breaks throughout.    Limitations Lifting;Standing;Walking;House hold activities    How long can you sit comfortably? Unlimited    How long can you stand comfortably? 5-10 min with support    How long can you walk comfortably? < 200 feet    Patient Stated Goals 2/2 to cognitive state, patient did not state speicifc goals. Spouse would like to see her ambulatory within the house.    Currently in Pain? No/denies            TREATMENT  Neuromuscular Re-education: // bars: STS, BUE support, SBA to ModA, x3 with min encouragement and reorientation to task Gait, ModA x1 with w/c follow, BUE support, x3 lengths with mod/min encouragement and VCs for sequencing, safety, and attention to task  Therapeutic Exercise: Seated hamstring stretch, BLE,  2 min x2 each leg for improved ability to stand Seated hip abduction isometrics, BLE, 5 sec hold x10 for improved pelvic control in SLS  Patient educated throughout session on appropriate technique and form using multi-modal cueing, HEP, and activity modification. Patient articulated understanding and returned demonstration.  Patient Response to interventions: "let's go."  ASSESSMENT Patient presents to clinic drowsy but amenable to therapy. Patient demonstrates deficits in cognition, posture, BLE strength, gait, balance, and function. Patient had improved  gait mechanics and L hip stability with increased reps during // bar gait during today's session and responded well to active interventions.  Patient will benefit from continued skilled therapeutic intervention to address deficits in cognition, posture, BLE strength, gait, balance, and function in order to increase function and improve overall QOL.       PT Long Term Goals - 03/21/20 1230      PT LONG TERM GOAL #1   Title Patient will be modified independent with spousal support with HEP for improved strength and balance in order to decrease fall risk and dependence at home and in the community.    Baseline IE: not demonstrated; 6/15: good cargegiver participation and understanding    Time 12    Period Weeks    Status Achieved    Target Date 05/03/20      PT LONG TERM GOAL #2   Title Patient will demonstrate improved function as evidenced by a score of 44 on FOTO measure for full participation in activities at home and in the community.    Baseline IE: 16; 6/15: deferred 2/2 to technical issues    Time 12    Period Weeks    Status On-going    Target Date 05/03/20      PT LONG TERM GOAL #3   Title Patient will perform STS with BUE support with Modified IND/SBA with VCs for sequencing and safety in order to participate more fully in basic ADLs.    Baseline IE: Mod/MinA, MAX VCs and TCs for sequencing/safety; 6/15: SBA, BUE // bar support, Min/Mod VCs for sequencing    Time 12    Period Weeks    Status On-going    Target Date 05/03/20      PT LONG TERM GOAL #4   Title Patient will demonstrate improved B knee extension ROM as evidenced by lacking < 10 degrees of B knee extension PROM/AROM in order to achieve standing posture with greater independence.    Baseline IE: B lacking 15-20 degrees of knee extension; 6/15: R 0 degrees, L lacking 5 degrees    Time 12    Period Weeks    Status Achieved    Target Date 05/03/20      PT LONG TERM GOAL #5   Title Patient will demonstrate  improved static standing balance as evidenced by ability to maintain standing posture with CGA/SBA, SUE support for 4 minutes in order to participate in basic ADLs.    Baseline IE: 90 sec, MinA, BUE support; 6/15: 2.5 min, CGA, BUE support    Time 12    Period Weeks    Status On-going    Target Date 05/03/20                 Plan - 04/11/20 1245    Clinical Impression Statement Patient presents to clinic drowsy but amenable to therapy. Patient demonstrates deficits in cognition, posture, BLE strength, gait, balance, and function. Patient had improved gait mechanics and L hip stability with increased reps during //  bar gait during today's session and responded well to active interventions.  Patient will benefit from continued skilled therapeutic intervention to address deficits in cognition, posture, BLE strength, gait, balance, and function in order to increase function and improve overall QOL.    Personal Factors and Comorbidities Age;Education;Comorbidity 3+;Past/Current Experience;Time since onset of injury/illness/exacerbation;Fitness;Transportation;Other   Cognitive State   Comorbidities anemia, breast cancer, chronic constipation, diverticulosis, hypertension, hyperlipidemia, osteoporosis    Examination-Activity Limitations Bathing;Dressing;Transfers;Hygiene/Grooming;Bed Mobility;Squat;Lift;Bend;Locomotion Level;Stairs;Reach Overhead;Stand;Toileting;Carry    Examination-Participation Restrictions Church;Interpersonal Relationship;Personal Finances;Yard Work;Cleaning;Laundry;Community Activity;Medication Management;Shop;Driving;Meal Prep    Stability/Clinical Decision Making Evolving/Moderate complexity    Rehab Potential Fair    PT Frequency 2x / week    PT Duration 12 weeks    PT Treatment/Interventions Moist Heat;DME Instruction;Therapeutic activities;Functional mobility training;Stair training;Gait training;Therapeutic exercise;Balance training;Neuromuscular re-education;Cognitive  remediation;Orthotic Fit/Training;Patient/family education;Manual techniques;Scar mobilization;Passive range of motion;Energy conservation;Taping    PT Next Visit Plan gait in // bars    PT Home Exercise Plan hamstring stretch    Consulted and Agree with Plan of Care Patient;Family member/caregiver    Family Member Consulted Cynthia Walker, husband           Patient will benefit from skilled therapeutic intervention in order to improve the following deficits and impairments:  Abnormal gait, Decreased balance, Decreased endurance, Decreased mobility, Difficulty walking, Hypomobility, Postural dysfunction, Improper body mechanics, Increased edema, Decreased range of motion, Decreased cognition, Decreased activity tolerance, Decreased coordination, Decreased safety awareness, Decreased strength, Impaired flexibility  Visit Diagnosis: Decreased range of motion (ROM) of both knees  Muscle weakness (generalized)  Difficulty in walking, not elsewhere classified     Problem List Patient Active Problem List   Diagnosis Date Noted   Encounter for hospice care discussion    MRSA bacteremia 02/17/2020   Streptococcal bacteremia 02/17/2020   Bacteremia 02/17/2020   History of breast cancer 02/17/2020   Goals of care, counseling/discussion    Palliative care by specialist    DNR (do not resuscitate) discussion    Breast pain, right 01/11/2020   Femoral neck fracture ([place]) 06/27/2019   Femur fracture, left ([place]) 06/26/2019   Right lower lobe pulmonary nodule 05/04/2019   Syncope 09/27/2017   Atypical chest pain 09/03/2017   Essential hypertension 09/03/2017   Short-term memory loss 09/03/2017   Osteoporosis 04/02/2016   Breast cancer, right ([place]) 04/06/2008   Myles Gip PT, DPT [phone] 04/11/2020, 12:48 PM  [facility] [address]. [place], [place], [address] Phone: [phone]   Fax:  [phone]  Name: Cynthia Walker MRN: [id number] Date of Birth: 18-May-1935

## 2020-04-12 ENCOUNTER — Ambulatory Visit
Admission: RE | Admit: 2020-04-12 | Discharge: 2020-04-12 | Disposition: A | Discharge status: Home / Self Care | Payer: Medicare HMO | Source: Ambulatory Visit | Attending: Hematology and Oncology | Admitting: Hematology and Oncology

## 2020-04-12 DIAGNOSIS — Z170 Estrogen receptor positive status [ER+]: Secondary | ICD-10-CM | POA: Insufficient documentation

## 2020-04-12 DIAGNOSIS — C50911 Malignant neoplasm of unspecified site of right female breast: Secondary | ICD-10-CM | POA: Insufficient documentation

## 2020-04-13 ENCOUNTER — Emergency Department: Payer: Medicare HMO

## 2020-04-13 ENCOUNTER — Other Ambulatory Visit: Payer: Self-pay

## 2020-04-13 ENCOUNTER — Encounter: Payer: Self-pay | Admitting: Emergency Medicine

## 2020-04-13 ENCOUNTER — Ambulatory Visit: Payer: Medicare HMO | Admitting: Physical Therapy

## 2020-04-13 ENCOUNTER — Emergency Department
Admission: EM | Admit: 2020-04-13 | Discharge: 2020-04-13 | Disposition: A | Discharge status: Home / Self Care | Payer: Medicare HMO | Attending: Student in an Organized Health Care Education/Training Program | Admitting: Student in an Organized Health Care Education/Training Program

## 2020-04-13 DIAGNOSIS — C50911 Malignant neoplasm of unspecified site of right female breast: Secondary | ICD-10-CM | POA: Diagnosis not present

## 2020-04-13 DIAGNOSIS — Z91040 Latex allergy status: Secondary | ICD-10-CM | POA: Diagnosis not present

## 2020-04-13 DIAGNOSIS — F0390 Unspecified dementia without behavioral disturbance: Secondary | ICD-10-CM | POA: Insufficient documentation

## 2020-04-13 DIAGNOSIS — Z79899 Other long term (current) drug therapy: Secondary | ICD-10-CM | POA: Diagnosis not present

## 2020-04-13 DIAGNOSIS — Z9221 Personal history of antineoplastic chemotherapy: Secondary | ICD-10-CM | POA: Diagnosis not present

## 2020-04-13 DIAGNOSIS — W19XXXA Unspecified fall, initial encounter: Secondary | ICD-10-CM

## 2020-04-13 DIAGNOSIS — I10 Essential (primary) hypertension: Secondary | ICD-10-CM | POA: Insufficient documentation

## 2020-04-13 NOTE — ED Provider Notes (Signed)
[facility] Emergency Department Provider Note  ____________________________________________   First MD Initiated Contact with Patient 04/13/20 1211     (approximate)  I have reviewed the triage vital signs and the nursing notes.   HISTORY  Chief Complaint Fall    HPI Cynthia Walker is a 85 y.o. female presents emergency department with her husband via EMS.  Husband was trying to help her up out of the bed and had wrapped his arms around her and they both fell to the floor.  She did land on top of him.  States he is concerned about her back as he had to squeeze tightly.  She has dementia and has not complained of anything.  She is not usually ambulatory.    Past Medical History:  Diagnosis Date  . Breast cancer [facility]) 2009   Right breast CA with lumpectomy, radiation and chemo tx's.  . Dementia ([place])   . Hypertension   . Personal history of chemotherapy   . Personal history of radiation therapy     Patient Active Problem List   Diagnosis Date Noted  . Encounter for hospice care discussion   . MRSA bacteremia 02/17/2020  . Streptococcal bacteremia 02/17/2020  . Bacteremia 02/17/2020  . History of breast cancer 02/17/2020  . Goals of care, counseling/discussion   . Palliative care by specialist   . DNR (do not resuscitate) discussion   . Breast pain, right 01/11/2020  . Femoral neck fracture ([place]) 06/27/2019  . Femur fracture, left ([place]) 06/26/2019  . Right lower lobe pulmonary nodule 05/04/2019  . Syncope 09/27/2017  . Atypical chest pain 09/03/2017  . Essential hypertension 09/03/2017  . Short-term memory loss 09/03/2017  . Osteoporosis 04/02/2016  . Breast cancer, right ([place]) 04/06/2008    Past Surgical History:  Procedure Laterality Date  . BREAST BIOPSY Right 03/15/2008   invasive mammary carcinoma  . BREAST LUMPECTOMY Right 04/14/2008   Pathology revealed a 1.1 cm grade I invasive ductal carcinoma. Margins were negative.   .  COLONOSCOPY WITH PROPOFOL N/A 07/10/2015   Procedure: COLONOSCOPY WITH PROPOFOL;  Surgeon: Hulen Luster, MD;  Location: [facility] ENDOSCOPY;  Service: Gastroenterology;  Laterality: N/A;  . INTRAMEDULLARY (IM) NAIL INTERTROCHANTERIC Left 06/27/2019   Procedure: INTRAMEDULLARY (IM) NAIL INTERTROCHANTRIC;  Surgeon: Creig Hines, MD;  Location: [facility] ORS;  Service: Orthopedics;  Laterality: Left;  . TONSILLECTOMY     age of 85 years old    Prior to Admission medications   Medication Sig Start Date End Date Taking? Authorizing Provider  donepezil (ARICEPT) 5 MG tablet Take 5 mg by mouth daily. 02/07/20   [provider]  furosemide (LASIX) 20 MG tablet Take 20 mg by mouth daily. 02/08/20   [provider]  Multiple Vitamin (MULTI-VITAMINS) TABS Take by mouth.    [provider]  QUEtiapine (SEROQUEL) 25 MG tablet Take 25 mg by mouth daily. At 4 pm. Compounded into suspension    [provider]  sertraline (ZOLOFT) 50 MG tablet Take 75 mg by mouth daily.  02/09/20   [provider]    Allergies Flu virus vaccine, Latex, Other, Procaine, Soy isoflavones, and Penicillins  Family History  Problem Relation Age of Onset  . Cancer Brother        lung cancer  . Cancer Sister        not sure  . Breast cancer Sister   . Heart disease Father   . Hypertension Father     Social History Social  History   Tobacco Use  . Smoking status: Never Smoker  . Smokeless tobacco: Never Used  Substance Use Topics  . Alcohol use: No  . Drug use: No    Review of Systems  Constitutional: No fever/chills Eyes: No visual changes. ENT: No sore throat. Respiratory: Denies cough Cardiovascular: Denies chest pain Gastrointestinal: Denies abdominal pain Genitourinary: Negative for dysuria. Musculoskeletal: Unsure back pain. Skin: Negative for rash. Psychiatric: no mood changes,     ____________________________________________   PHYSICAL EXAM:  VITAL SIGNS: ED  Triage Vitals [04/13/20 1059]  Enc Vitals Group     BP 119/73     Pulse Rate 75     Resp 18     Temp 98.5 F (36.9 C)     Temp Source Oral     SpO2 100 %     Weight 131 lb (59.4 kg)     Height 5\' 7"  (1.702 m)     Head Circumference      Peak Flow      Pain Score 0     Pain Loc      Pain Edu?      Excl. in [place]?     Constitutional: Alert and oriented. Well appearing and in no acute distress. Eyes: Conjunctivae are normal.  Head: Atraumatic. Nose: No congestion/rhinnorhea. Mouth/Throat: Mucous membranes are moist.   Neck:  supple no lymphadenopathy noted Cardiovascular: Normal rate, regular rhythm. Heart sounds are normal Respiratory: Normal respiratory effort.  No retractions, lungs c t a  Abd: soft nontender bs normal all 4 quad GU: deferred Musculoskeletal: Extremities are nontender, patient does not complain when I palpate her spine, exam is limited due to the patient's dementia neurologic:  Normal speech and language.  Skin:  Skin is warm, dry and intact. No rash noted. Psychiatric: Mood and affect are normal. Speech and behavior are normal.  ____________________________________________   LABS (all labs ordered are listed, but only abnormal results are displayed)  Labs Reviewed - No data to display ____________________________________________   ____________________________________________  RADIOLOGY  X-ray lumbar spine and T-spine do not show any acute abnormality  ____________________________________________   PROCEDURES  Procedure(s) performed: No  Procedures    ____________________________________________   INITIAL IMPRESSION / ASSESSMENT AND PLAN / ED COURSE  Pertinent labs & imaging results that were available during my care of the patient were reviewed by me and considered in my medical decision making (see chart for details).   Patient is a 85 year old female presents emergency department after a near fall.  See HPI physical exam shows patient  to have severe dementia.  Due to the husband's concerns of her back we will do an x-ray as the exam is very limited due to her dementia.  X-ray of the lumbar spine and T-spine ordered   X-rays do not show any acute abnormalities are reassuring.  Did explain the findings to the husband.  He feels much more comfortable in taking her home knowing that she did not break her spine when they fell.  He is happy with her care and ready to take her home.  She was discharged in stable condition.   As part of my medical decision making, I reviewed the following data within the [place] History obtained from family, Nursing notes reviewed and incorporated, Old chart reviewed, Radiograph reviewed , Notes from prior ED visits and [place] Controlled Substance Database  ____________________________________________   FINAL CLINICAL IMPRESSION(S) / ED DIAGNOSES  Final diagnoses:  Fall, initial encounter  NEW MEDICATIONS STARTED DURING THIS VISIT:  New Prescriptions   No medications on file     Note:  This document was prepared using Dragon voice recognition software and may include unintentional dictation errors.    Versie Starks, PA-C 04/13/20 1436    Merlyn Lot, MD 04/13/20 [phone]

## 2020-04-13 NOTE — ED Triage Notes (Signed)
Pt in via EMS from home with c/o slipping from bed to floor. Pts husband trid to pick her up and thinks she may have pulled her back. 160/70, HR 70, 96%RA. Pt confused, unsure of hx.

## 2020-04-14 ENCOUNTER — Other Ambulatory Visit: Payer: Self-pay

## 2020-04-14 ENCOUNTER — Other Ambulatory Visit: Payer: Medicare HMO | Admitting: Primary Care

## 2020-04-14 DIAGNOSIS — R413 Other amnesia: Secondary | ICD-10-CM

## 2020-04-14 DIAGNOSIS — Z515 Encounter for palliative care: Secondary | ICD-10-CM

## 2020-04-14 NOTE — Progress Notes (Signed)
[facility] Consult Note Telephone: [phone]  Fax: [phone]  PATIENT NAME: Cynthia Walker [id number] [phone] (home)  DOB: 1934-12-26 MRN: [id number]  PRIMARY CARE PROVIDER:    Sofie Hartigan, MD,  [place] [facility] [place] [address] [phone]  REFERRING PROVIDER:   Sofie Hartigan, [place],  [place] [address] [phone]  RESPONSIBLE PARTY:   Extended Emergency Contact Information Primary Emergency Contact: Weir,Richard E Address: [place], [place] [address] [address] [place] of [place] Phone: [phone] Mobile Phone: [phone] Relation: Spouse Secondary Emergency Contact: Triola,Janet Address: [address]          [place], [place] [address] Johnnette Litter of [place] Phone: [phone] Relation: Daughter  I met with patient and family in home.  ASSESSMENT AND RECOMMENDATIONS:   1. Advance Care Planning/Goals of Care: Goals include to maximize quality of life and symptom management. Caregiver strain is the central issue. Pt and husband recounted their meeting at ages 85 and 16, and their marriage 2 years later. Pt did not finish high school but went to [place] where Mr. Vandermeer was stationed in the [place] to marry at age 85. They have always been together and are very devoted to one another. Resolving increasing care giver needs is very difficult for them to navigate. He states he called [facility] but did not get a lot of help. I encouraged him  to call again.   Caregiver strain: Remains high. Has 6 hrs of paid caregivers daily which is taking their resources. Again discussed a [place] or similar where they could buy in for care, or even use resources from home for care. Husband is very reticent to consider and states this talk upsets his wife. We discussed his belief God would provide, and I encouraged him to be open to what that  provision may look like. He said he would consider.   2. Symptom Management:   Nutrition: Continues to be poor due to loss of appetite, more subjective weight loss. Recent ED trip  Last pm for fall, no recent labs drawn.   Mobility: Limited due to loss with dementia deficits. Fell last pm during  A transfer with husband. We discussed a sit to stand lift which may be of some help but is a great expense and not reimbursed by insurance. Also discussed shortening area of transfer to not include walking, just pivot standing to turn.   Agitation: States it is not too bad most days. He is giving the sertraline but stopped seroquel due to bad and bitter taste. Patient appears relaxed and joins the conversation, smiling at times and  adding to their story at times.  3. Family /Caregiver/Community Supports: Adult children live locally and in [place]. They have asked parents to move into a place with services. One son is disabled.We discussed him moving in to help as well.   4. Cognitive / Functional decline: A and O x 1-2.Fast 7A-b. Needs help with all adls, dependent in all iadls.   5. Follow up Palliative Care Visit: Palliative care will continue to follow for goals of care clarification and symptom management. Return 4 weeks or prn.  I spent 60 minutes providing this consultation,  from 1315 to 1415. More than 50% of the time in this consultation was spent coordinating communication.   Chief Complaint: Ongoing caregiver strain, frequent falls  with  ED trip last pm.   HISTORY OF PRESENT ILLNESS:  Cynthia Walker is a 85 y.o. year old female with multiple medical problems including mobility problems, frequent falls, weight loss, . Palliative Care was asked to follow this patient by consultation request of Feldpausch, Chrissie Noa, MD to help address advance care planning and goals of care. This is a follow up visit.  CODE STATUS: TBD  PPS: 40%  HOSPICE ELIGIBILITY/DIAGNOSIS: no PAST MEDICAL HISTORY:  Past  Medical History:  Diagnosis Date  . Breast cancer [facility]) 2009   Right breast CA with lumpectomy, radiation and chemo tx's.  . Dementia ([place])   . Hypertension   . Personal history of chemotherapy   . Personal history of radiation therapy     SOCIAL HX:  Social History   Tobacco Use  . Smoking status: Never Smoker  . Smokeless tobacco: Never Used  Substance Use Topics  . Alcohol use: No    ALLERGIES:  Allergies  Allergen Reactions  . Flu Virus Vaccine     Other reaction(s): Other (See Comments) Stomach cramps  . Latex Swelling  . Other     Other reaction(s): Unknown Allergy to eggs  . Procaine Other (See Comments)    weakness  . Soy Isoflavones     Other reaction(s): Other (See Comments) Soy causes legs to feel like rubber  . Penicillins Rash    Has patient had a PCN reaction causing immediate rash, facial/tongue/throat swelling, SOB or lightheadedness with hypotension: No Has patient had a PCN reaction causing severe rash involving mucus membranes or skin necrosis: No Has patient had a PCN reaction that required hospitalization: No Has patient had a PCN reaction occurring within the last 10 years: No If all of the above answers are "NO", then may proceed with Cephalosporin use.      PERTINENT MEDICATIONS:  Outpatient Encounter Medications as of 04/14/2020  Medication Sig  . donepezil (ARICEPT) 5 MG tablet Take 5 mg by mouth daily.  . furosemide (LASIX) 20 MG tablet Take 20 mg by mouth daily.  . Multiple Vitamin (MULTI-VITAMINS) TABS Take by mouth.  . QUEtiapine (SEROQUEL) 25 MG tablet Take 25 mg by mouth daily. At 4 pm. Compounded into suspension  . sertraline (ZOLOFT) 50 MG tablet Take 75 mg by mouth daily.    No facility-administered encounter medications on file as of 04/14/2020.    PHYSICAL EXAM / ROS:   Current and past weights: unavailable General: NAD, frail appearing, very thin Cardiovascular: no chest pain reported, no edema  Pulmonary: no cough, no  increased SOB, room air Abdomen: appetite fair,albumin 2.7 2 months ago, no repeat on file, incontinent of bowel  At times  GU: denies dysuria, incontinent of urine at times MSK:  no joint and ROM abnormalities, ambulatory Skin: no rashes or wounds reported Neurological: Weakness, advanced dementia, FAST 7A-B  Jason Coop, NP [facility]  COVID-19 PATIENT SCREENING TOOL  Person answering questions: ____________Richard______ _____   1.  Is the patient or any family member in the home showing any signs or symptoms regarding respiratory infection?               Person with Symptom- __________NA_________________  a. Fever  Yes___ No___          ___________________  b. Shortness of breath                                                    Yes___ No___          ___________________ c. Cough/congestion                                       Yes___  No___         ___________________ d. Body aches/pains                                                         Yes___ No___        ____________________ e. Gastrointestinal symptoms (diarrhea, nausea)           Yes___ No___        ____________________  2. Within the past 14 days, has anyone living in the home had any contact with someone with or under investigation for COVID-19?    Yes___ No_X_   Person __________________

## 2020-04-18 ENCOUNTER — Ambulatory Visit: Payer: Medicare HMO | Admitting: Physical Therapy

## 2020-04-18 ENCOUNTER — Other Ambulatory Visit: Payer: Self-pay

## 2020-04-18 DIAGNOSIS — M25661 Stiffness of right knee, not elsewhere classified: Secondary | ICD-10-CM | POA: Diagnosis not present

## 2020-04-18 DIAGNOSIS — M6281 Muscle weakness (generalized): Secondary | ICD-10-CM

## 2020-04-18 DIAGNOSIS — R262 Difficulty in walking, not elsewhere classified: Secondary | ICD-10-CM

## 2020-04-18 NOTE — Therapy (Signed)
[facility] [address] [place], [place], [address] Phone: [phone]   Fax:  [phone]  Physical Therapy Treatment  Patient Details  Name: Cynthia Walker MRN: [id number] Date of Birth: October 25, 1934 Referring Provider (PT): Feldpausch   Encounter Date: 04/18/2020   PT End of Session - 04/18/20 1249    Visit Number 17    Number of Visits 24    Date for PT Re-Evaluation 05/03/20    PT Start Time 1115    PT Stop Time 1211    PT Time Calculation (min) 56 min    Equipment Utilized During Treatment Gait belt    Activity Tolerance Patient tolerated treatment well;Other (comment)   Tx limited 2/2 to attention span   Behavior During Therapy [facility] for tasks assessed/performed   Highly distractable          Past Medical History:  Diagnosis Date  . Breast cancer [facility]) 2009   Right breast CA with lumpectomy, radiation and chemo tx's.  . Dementia ([place])   . Hypertension   . Personal history of chemotherapy   . Personal history of radiation therapy     Past Surgical History:  Procedure Laterality Date  . BREAST BIOPSY Right 03/15/2008   invasive mammary carcinoma  . BREAST LUMPECTOMY Right 04/14/2008   Pathology revealed a 1.1 cm grade I invasive ductal carcinoma. Margins were negative.   . COLONOSCOPY WITH PROPOFOL N/A 07/10/2015   Procedure: COLONOSCOPY WITH PROPOFOL;  Surgeon: Hulen Luster, MD;  Location: [facility] ENDOSCOPY;  Service: Gastroenterology;  Laterality: N/A;  . INTRAMEDULLARY (IM) NAIL INTERTROCHANTERIC Left 06/27/2019   Procedure: INTRAMEDULLARY (IM) NAIL INTERTROCHANTRIC;  Surgeon: Creig Hines, MD;  Location: [facility] ORS;  Service: Orthopedics;  Laterality: Left;  . TONSILLECTOMY     age of 85 years old    There were no vitals filed for this visit.   Subjective Assessment - 04/18/20 1246    Subjective Patient presents to clinic 15 minutes late, but in good spirits and amenable to therapy. Patient and spouse deny any  further falls and was cleared by the ED.    Patient is accompained by: Family member   husband, [name]   Pertinent History Femur fracture, left (CMS-HCC) 06/26/2019; Patient was ambulatory without AD prior to the fall that resulted in fx. Patient's spouse notes that she was capable of walking ~0.5 miles prior to the fall. Patient's spouse reports that she can at present stand for up to 10 min, but not consistently and not without assistance. While working with HHPT patient was able to ambulate with a RW for ~ 200 feet with rest breaks throughout.    Limitations Lifting;Standing;Walking;House hold activities    How long can you sit comfortably? Unlimited    How long can you stand comfortably? 5-10 min with support    How long can you walk comfortably? < 200 feet    Patient Stated Goals 2/2 to cognitive state, patient did not state speicifc goals. Spouse would like to see her ambulatory within the house.    Currently in Pain? No/denies          TREATMENT  Neuromuscular Re-education: // bars: STS, BUE support, SBA to ModA, x4 with min encouragement and reorientation to task Gait, ModA x1 with w/c follow, BUE support, x3 lengths with mod/min encouragement and VCs for sequencing, safety, and attention to task Patient and caregiver education on safe strategies for w/c<>bed transfers and impacts of level of cognition and level of arousal  on ability to participate and progress with physical therapy.  Therapeutic Exercise: Seated hamstring stretch, BLE, 2 min x2 each leg for improved ability to stand Seated hip abduction isometrics, BLE, 5 sec hold x10 for improved pelvic control in SLS  Patient educated throughout session on appropriate technique and form using multi-modal cueing, HEP, and activity modification. Patient articulated understanding and returned demonstration.  Patient Response to interventions: In good spirits and seeming to participate in education.  ASSESSMENT Patient presents to  clinic drowsy but amenable to therapy. Patient demonstrates deficits in cognition, posture, BLE strength, gait, balance, and function. Patient demonstrating good ability to stand with minimal cueing and able to sequence/coordinate arm and leg movement with gait without cueing occasionally during today's session and responded well to active interventions.  Patient will benefit from continued skilled therapeutic intervention to address deficits in cognition, posture, BLE strength, gait, balance, and function in order to increase function and improve overall QOL.     PT Long Term Goals - 03/21/20 1230      PT LONG TERM GOAL #1   Title Patient will be modified independent with spousal support with HEP for improved strength and balance in order to decrease fall risk and dependence at home and in the community.    Baseline IE: not demonstrated; 6/15: good cargegiver participation and understanding    Time 12    Period Weeks    Status Achieved    Target Date 05/03/20      PT LONG TERM GOAL #2   Title Patient will demonstrate improved function as evidenced by a score of 44 on FOTO measure for full participation in activities at home and in the community.    Baseline IE: 16; 6/15: deferred 2/2 to technical issues    Time 12    Period Weeks    Status On-going    Target Date 05/03/20      PT LONG TERM GOAL #3   Title Patient will perform STS with BUE support with Modified IND/SBA with VCs for sequencing and safety in order to participate more fully in basic ADLs.    Baseline IE: Mod/MinA, MAX VCs and TCs for sequencing/safety; 6/15: SBA, BUE // bar support, Min/Mod VCs for sequencing    Time 12    Period Weeks    Status On-going    Target Date 05/03/20      PT LONG TERM GOAL #4   Title Patient will demonstrate improved B knee extension ROM as evidenced by lacking < 10 degrees of B knee extension PROM/AROM in order to achieve standing posture with greater independence.    Baseline IE: B lacking  15-20 degrees of knee extension; 6/15: R 0 degrees, L lacking 5 degrees    Time 12    Period Weeks    Status Achieved    Target Date 05/03/20      PT LONG TERM GOAL #5   Title Patient will demonstrate improved static standing balance as evidenced by ability to maintain standing posture with CGA/SBA, SUE support for 4 minutes in order to participate in basic ADLs.    Baseline IE: 90 sec, MinA, BUE support; 6/15: 2.5 min, CGA, BUE support    Time 12    Period Weeks    Status On-going    Target Date 05/03/20                 Plan - 04/18/20 1250    Clinical Impression Statement Patient presents to clinic drowsy but amenable to  therapy. Patient demonstrates deficits in cognition, posture, BLE strength, gait, balance, and function. Patient demonstrating good ability to stand with minimal cueing and able to sequence/coordinate arm and leg movement with gait without cueing occasionally during today's session and responded well to active interventions.  Patient will benefit from continued skilled therapeutic intervention to address deficits in cognition, posture, BLE strength, gait, balance, and function in order to increase function and improve overall QOL.    Personal Factors and Comorbidities Age;Education;Comorbidity 3+;Past/Current Experience;Time since onset of injury/illness/exacerbation;Fitness;Transportation;Other   Cognitive State   Comorbidities anemia, breast cancer, chronic constipation, diverticulosis, hypertension, hyperlipidemia, osteoporosis    Examination-Activity Limitations Bathing;Dressing;Transfers;Hygiene/Grooming;Bed Mobility;Squat;Lift;Bend;Locomotion Level;Stairs;Reach Overhead;Stand;Toileting;Carry    Examination-Participation Restrictions Church;Interpersonal Relationship;Personal Finances;Yard Work;Cleaning;Laundry;Community Activity;Medication Management;Shop;Driving;Meal Prep    Stability/Clinical Decision Making Evolving/Moderate complexity    Rehab Potential Fair     PT Frequency 2x / week    PT Duration 12 weeks    PT Treatment/Interventions Moist Heat;DME Instruction;Therapeutic activities;Functional mobility training;Stair training;Gait training;Therapeutic exercise;Balance training;Neuromuscular re-education;Cognitive remediation;Orthotic Fit/Training;Patient/family education;Manual techniques;Scar mobilization;Passive range of motion;Energy conservation;Taping    PT Next Visit Plan gait in // bars    PT Home Exercise Plan hamstring stretch    Consulted and Agree with Plan of Care Patient;Family member/caregiver    Family Member Consulted Ericha Whittingham, husband           Patient will benefit from skilled therapeutic intervention in order to improve the following deficits and impairments:  Abnormal gait, Decreased balance, Decreased endurance, Decreased mobility, Difficulty walking, Hypomobility, Postural dysfunction, Improper body mechanics, Increased edema, Decreased range of motion, Decreased cognition, Decreased activity tolerance, Decreased coordination, Decreased safety awareness, Decreased strength, Impaired flexibility  Visit Diagnosis: Decreased range of motion (ROM) of both knees  Muscle weakness (generalized)  Difficulty in walking, not elsewhere classified     Problem List Patient Active Problem List   Diagnosis Date Noted  . Encounter for hospice care discussion   . MRSA bacteremia 02/17/2020  . Streptococcal bacteremia 02/17/2020  . Bacteremia 02/17/2020  . History of breast cancer 02/17/2020  . Goals of care, counseling/discussion   . Palliative care by specialist   . DNR (do not resuscitate) discussion   . Breast pain, right 01/11/2020  . Femoral neck fracture ([place]) 06/27/2019  . Femur fracture, left ([place]) 06/26/2019  . Right lower lobe pulmonary nodule 05/04/2019  . Syncope 09/27/2017  . Atypical chest pain 09/03/2017  . Essential hypertension 09/03/2017  . Short-term memory loss 09/03/2017  . Osteoporosis  04/02/2016  . Breast cancer, right [facility]) 04/06/2008   Myles Gip PT, DPT [phone] 04/18/2020, 1:04 PM  [facility] [address] [place], [place], [address] Phone: [phone]   Fax:  [phone]  Name: Cynthia Walker MRN: [id number] Date of Birth: 07-20-1935

## 2020-04-19 ENCOUNTER — Ambulatory Visit: Payer: Medicare HMO | Admitting: Nurse Practitioner

## 2020-04-19 ENCOUNTER — Inpatient Hospital Stay: Payer: Medicare HMO

## 2020-04-19 ENCOUNTER — Other Ambulatory Visit: Payer: Medicare HMO

## 2020-04-26 ENCOUNTER — Inpatient Hospital Stay: Payer: Medicare HMO | Attending: Hematology and Oncology | Admitting: Hematology and Oncology

## 2020-04-26 ENCOUNTER — Inpatient Hospital Stay: Payer: Medicare HMO

## 2020-04-26 ENCOUNTER — Other Ambulatory Visit: Payer: Self-pay | Admitting: Hematology and Oncology

## 2020-04-26 ENCOUNTER — Telehealth: Payer: Self-pay | Admitting: Hematology and Oncology

## 2020-04-26 ENCOUNTER — Other Ambulatory Visit: Payer: Self-pay

## 2020-04-26 ENCOUNTER — Encounter: Payer: Self-pay | Admitting: Hematology and Oncology

## 2020-04-26 ENCOUNTER — Other Ambulatory Visit: Payer: Medicare HMO

## 2020-04-26 VITALS — BP 135/72 | HR 75 | Temp 97.6°F | Wt 131.0 lb

## 2020-04-26 DIAGNOSIS — Z801 Family history of malignant neoplasm of trachea, bronchus and lung: Secondary | ICD-10-CM | POA: Insufficient documentation

## 2020-04-26 DIAGNOSIS — Z923 Personal history of irradiation: Secondary | ICD-10-CM | POA: Diagnosis not present

## 2020-04-26 DIAGNOSIS — Z79899 Other long term (current) drug therapy: Secondary | ICD-10-CM | POA: Insufficient documentation

## 2020-04-26 DIAGNOSIS — Z803 Family history of malignant neoplasm of breast: Secondary | ICD-10-CM | POA: Diagnosis not present

## 2020-04-26 DIAGNOSIS — F0390 Unspecified dementia without behavioral disturbance: Secondary | ICD-10-CM | POA: Insufficient documentation

## 2020-04-26 DIAGNOSIS — R911 Solitary pulmonary nodule: Secondary | ICD-10-CM | POA: Diagnosis not present

## 2020-04-26 DIAGNOSIS — Z853 Personal history of malignant neoplasm of breast: Secondary | ICD-10-CM | POA: Diagnosis not present

## 2020-04-26 DIAGNOSIS — C50911 Malignant neoplasm of unspecified site of right female breast: Secondary | ICD-10-CM | POA: Diagnosis not present

## 2020-04-26 DIAGNOSIS — Z809 Family history of malignant neoplasm, unspecified: Secondary | ICD-10-CM | POA: Diagnosis not present

## 2020-04-26 DIAGNOSIS — I10 Essential (primary) hypertension: Secondary | ICD-10-CM | POA: Insufficient documentation

## 2020-04-26 DIAGNOSIS — R918 Other nonspecific abnormal finding of lung field: Secondary | ICD-10-CM | POA: Diagnosis present

## 2020-04-26 DIAGNOSIS — D649 Anemia, unspecified: Secondary | ICD-10-CM | POA: Insufficient documentation

## 2020-04-26 DIAGNOSIS — M810 Age-related osteoporosis without current pathological fracture: Secondary | ICD-10-CM | POA: Diagnosis not present

## 2020-04-26 DIAGNOSIS — Z9221 Personal history of antineoplastic chemotherapy: Secondary | ICD-10-CM | POA: Diagnosis not present

## 2020-04-26 DIAGNOSIS — Z170 Estrogen receptor positive status [ER+]: Secondary | ICD-10-CM

## 2020-04-26 LAB — CBC WITH DIFFERENTIAL/PLATELET
Abs Immature Granulocytes: 0.03 10*3/uL (ref 0.00–0.07)
Basophils Absolute: 0 10*3/uL (ref 0.0–0.1)
Basophils Relative: 0 %
Eosinophils Absolute: 0.1 10*3/uL (ref 0.0–0.5)
Eosinophils Relative: 1 %
HCT: 35.7 % — ABNORMAL LOW (ref 36.0–46.0)
Hemoglobin: 11.2 g/dL — ABNORMAL LOW (ref 12.0–15.0)
Immature Granulocytes: 0 %
Lymphocytes Relative: 11 %
Lymphs Abs: 1 10*3/uL (ref 0.7–4.0)
MCH: 28.6 pg (ref 26.0–34.0)
MCHC: 31.4 g/dL (ref 30.0–36.0)
MCV: 91.1 fL (ref 80.0–100.0)
Monocytes Absolute: 0.6 10*3/uL (ref 0.1–1.0)
Monocytes Relative: 7 %
Neutro Abs: 7 10*3/uL (ref 1.7–7.7)
Neutrophils Relative %: 81 %
Platelets: 417 10*3/uL — ABNORMAL HIGH (ref 150–400)
RBC: 3.92 MIL/uL (ref 3.87–5.11)
RDW: 14.5 % (ref 11.5–15.5)
WBC: 8.8 10*3/uL (ref 4.0–10.5)
nRBC: 0 % (ref 0.0–0.2)

## 2020-04-26 LAB — IRON AND TIBC
Iron: 31 ug/dL (ref 28–170)
Saturation Ratios: 11 % (ref 10.4–31.8)
TIBC: 277 ug/dL (ref 250–450)
UIBC: 246 ug/dL

## 2020-04-26 LAB — FOLATE: Folate: 14.5 ng/mL (ref >5.9)

## 2020-04-26 LAB — FERRITIN: Ferritin: 121 ng/mL (ref 11–307)

## 2020-04-26 NOTE — Telephone Encounter (Signed)
Left message per patient's request on answering machine to confirm CT appt on 7/28. Instructions were left on answering machine for patient.

## 2020-04-26 NOTE — Progress Notes (Signed)
[facility]  [address], [address] [place], [place] [address] Phone: [phone]  Fax: [phone]   Office Visit:  04/26/2020  Referring physician: Marina Goodell, MD  Chief Complaint: Cynthia Walker is a 85 y.o. female with stage IA right breast cancerand a right lower lobe lung nodule who is seen for a 3 month assessment.  HPI: The patient was last seen in the medical oncology clinic on 01/19/2020. At that time, her breast/rib pain was improving.  She denied any fever or cough.  She had completed a course of azithromycin for possible pneumonia.  She presented to the [facility] ER on 02/16/2020 with an altered mental status greater than usual per husband; when asked if she had gotten her COVID vaccine, she didn't know about COVID. There was no evidence of UTI. CT head was negative for acute findings. Blood cultures returned positive for coag negative staph (possible contaminant) after leaving the ED initially, but they were informed to return.  She was admitted to [facility] from 02/16/2020 - 02/21/2020. She received IV vancomycin.   She presented to the [facility] ER on 02/28/2020 with altered mental status and strong urine odor. UA showed rare bacteria but no other signs of infection. She was baseline mental status on evaluation and discharged home.  She presented again to the [facility] ER on 04/13/2020 after her husband slipped while helping her out of the bed and she landed on top of her husband. He notes that spine films were negative.   She was scheduled for a bilateral screening mammogram on 04/12/2020. They were unable to do the exam because she was unable to stand; they tried to lower the machine to where she was sitting and it couldn't be completed.  The patient's husband requests no further mammograms.  During the interim, her husband notes that he hasn't been able to get her to drink many fluids. He tries to get her to drink juices, boost, lemonaid, sweet tea, but  she still doesn't drink much. He hasn't tried any popsicles.  She needs assistance with her ADLs.   Past Medical History:  Diagnosis Date  . Breast cancer [facility]) 2009   Right breast CA with lumpectomy, radiation and chemo tx's.  . Dementia (HCC)   . Hypertension   . Personal history of chemotherapy   . Personal history of radiation therapy     Past Surgical History:  Procedure Laterality Date  . BREAST BIOPSY Right 03/15/2008   invasive mammary carcinoma  . BREAST LUMPECTOMY Right 04/14/2008   Pathology revealed a 1.1 cm grade I invasive ductal carcinoma. Margins were negative.   . COLONOSCOPY WITH PROPOFOL N/A 07/10/2015   Procedure: COLONOSCOPY WITH PROPOFOL;  Surgeon: Wallace Cullens, MD;  Location: [facility] ENDOSCOPY;  Service: Gastroenterology;  Laterality: N/A;  . INTRAMEDULLARY (IM) NAIL INTERTROCHANTERIC Left 06/27/2019   Procedure: INTRAMEDULLARY (IM) NAIL INTERTROCHANTRIC;  Surgeon: Garnette Gunner, MD;  Location: [facility] ORS;  Service: Orthopedics;  Laterality: Left;  . TONSILLECTOMY     age of 85 years old    Family History  Problem Relation Age of Onset  . Cancer Brother        lung cancer  . Cancer Sister        not sure  . Breast cancer Sister   . Heart disease Father   . Hypertension Father     Social History:  reports that she has never smoked. She has never used smokeless tobacco. She reports that she does not drink alcohol  and does not use drugs. She has never smoked and has not had significant exposure to secondhand smoke.Her husband's name is [name]. She lives in [place]. The patient is accompanied by her husband today.   Allergies:  Allergies  Allergen Reactions  . Flu Virus Vaccine     Other reaction(s): Other (See Comments) Stomach cramps  . Latex Swelling  . Other     Other reaction(s): Unknown Allergy to eggs  . Procaine Other (See Comments)    weakness  . Soy Isoflavones     Other reaction(s): Other (See Comments) Soy causes legs to feel like rubber  .  Penicillins Rash    Has patient had a PCN reaction causing immediate rash, facial/tongue/throat swelling, SOB or lightheadedness with hypotension: No Has patient had a PCN reaction causing severe rash involving mucus membranes or skin necrosis: No Has patient had a PCN reaction that required hospitalization: No Has patient had a PCN reaction occurring within the last 10 years: No If all of the above answers are "NO", then may proceed with Cephalosporin use.     Current Medications: Current Outpatient Medications  Medication Sig Dispense Refill  . donepezil (ARICEPT) 5 MG tablet Take 5 mg by mouth daily.    . furosemide (LASIX) 20 MG tablet Take 20 mg by mouth daily.    . Multiple Vitamin (MULTI-VITAMINS) TABS Take by mouth.    . QUEtiapine (SEROQUEL) 25 MG tablet Take 25 mg by mouth daily. At 4 pm. Compounded into suspension    . sertraline (ZOLOFT) 50 MG tablet Take 75 mg by mouth daily.      No current facility-administered medications for this visit.    Review of Systems  Constitutional: Positive for weight loss. Negative for chills, diaphoresis, fever and malaise/fatigue.       Feels "pretty good". Husband assist with ADLs.   HENT: Negative for congestion, ear discharge, ear pain, hearing loss, nosebleeds, sinus pain and sore throat.   Eyes: Negative for blurred vision.  Respiratory: Positive for cough (intermittent). Negative for hemoptysis, sputum production and shortness of breath.   Cardiovascular: Negative for chest pain, palpitations and leg swelling.  Gastrointestinal: Negative for abdominal pain, blood in stool, constipation, diarrhea, heartburn, melena, nausea and vomiting.  Genitourinary: Negative for dysuria, frequency, hematuria and urgency.  Musculoskeletal: Negative for back pain, falls, joint pain, myalgias and neck pain.  Skin: Negative for itching and rash.  Neurological: Negative for dizziness, tingling, sensory change, weakness and headaches.    Endo/Heme/Allergies: Does not bruise/bleed easily.  Psychiatric/Behavioral: Positive for memory loss. Negative for depression. The patient is not nervous/anxious and does not have insomnia.   All other systems reviewed and are negative.   Performance status (ECOG): 3 - Symptomatic, >50% confined to bed   Physical Exam Vitals and nursing note reviewed.  Constitutional:      Appearance: Normal appearance. She is well-developed. She is not diaphoretic.     Interventions: Face mask in place.     Comments: Patient sitting in a wheelchair in no acute distress.  She defers most of her questions to her husband.  HENT:     Head: Normocephalic and atraumatic.     Comments: Currently white hair.    Right Ear: Hearing normal.     Left Ear: Hearing normal.     Mouth/Throat:     Mouth: No oral lesions.     Pharynx: No oropharyngeal exudate.  Eyes:     General: No scleral icterus.  Right eye: No discharge.        Left eye: No discharge.     Conjunctiva/sclera: Conjunctivae normal.     Pupils: Pupils are equal, round, and reactive to light.     Comments: Brown/hazel eyes.  Neck:     Vascular: No JVD.  Cardiovascular:     Rate and Rhythm: Normal rate and regular rhythm.     Heart sounds: Normal heart sounds. No murmur heard.  No friction rub. No gallop.   Pulmonary:     Effort: Pulmonary effort is normal.     Breath sounds: Normal breath sounds. No wheezing, rhonchi or rales.  Chest:     Breasts:        Right: Tenderness (along scar line) present. No inverted nipple, mass, nipple discharge or skin change.        Left: Tenderness present. No inverted nipple, mass, nipple discharge or skin change (fibrocystic changes).  Abdominal:     General: Bowel sounds are normal.     Palpations: Abdomen is soft. There is no mass.     Tenderness: There is no abdominal tenderness. There is no guarding or rebound.  Musculoskeletal:        General: No tenderness. Normal range of motion.      Cervical back: Normal range of motion and neck supple.  Lymphadenopathy:     Head:     Right side of head: No preauricular, posterior auricular or occipital adenopathy.     Left side of head: No preauricular, posterior auricular or occipital adenopathy.     Cervical: No cervical adenopathy.     Upper Body:     Right upper body: No supraclavicular or axillary adenopathy.     Left upper body: No supraclavicular or axillary adenopathy.     Lower Body: No right inguinal adenopathy. No left inguinal adenopathy.  Skin:    General: Skin is warm and dry.     Coloration: Skin is not pale.     Findings: No bruising, erythema, lesion or rash.  Neurological:     Mental Status: She is alert. Mental status is at baseline.  Psychiatric:        Behavior: Behavior normal. Behavior is cooperative.        Thought Content: Thought content normal.        Cognition and Memory: Cognition is impaired. Memory is impaired.        Judgment: Judgment normal.     Appointment on 04/26/2020  Component Date Value Ref Range Status  . WBC 04/26/2020 8.8  4.0 - 10.5 K/uL Final  . RBC 04/26/2020 3.92  3.87 - 5.11 MIL/uL Final  . Hemoglobin 04/26/2020 11.2* 12.0 - 15.0 g/dL Final  . HCT 04/26/2020 35.7* 36 - 46 % Final  . MCV 04/26/2020 91.1  80.0 - 100.0 fL Final  . MCH 04/26/2020 28.6  26.0 - 34.0 pg Final  . MCHC 04/26/2020 31.4  30.0 - 36.0 g/dL Final  . RDW 04/26/2020 14.5  11.5 - 15.5 % Final  . Platelets 04/26/2020 417* 150 - 400 K/uL Final  . nRBC 04/26/2020 0.0  0.0 - 0.2 % Final  . Neutrophils Relative % 04/26/2020 81  % Final  . Neutro Abs 04/26/2020 7.0  1.7 - 7.7 K/uL Final  . Lymphocytes Relative 04/26/2020 11  % Final  . Lymphs Abs 04/26/2020 1.0  0.7 - 4.0 K/uL Final  . Monocytes Relative 04/26/2020 7  % Final  . Monocytes Absolute 04/26/2020 0.6  0 - 1  K/uL Final  . Eosinophils Relative 04/26/2020 1  % Final  . Eosinophils Absolute 04/26/2020 0.1  0 - 0 K/uL Final  . Basophils Relative  04/26/2020 0  % Final  . Basophils Absolute 04/26/2020 0.0  0 - 0 K/uL Final  . Immature Granulocytes 04/26/2020 0  % Final  . Abs Immature Granulocytes 04/26/2020 0.03  0.00 - 0.07 K/uL Final   Performed at [facility], [address] [place], [place] [address]    Assessment:  LAVEYAH ORIOL is a 85 y.o. female with stage IA right breast cancers/p lumpectomy and sentinel lymph node biopsy in 04/2008. Pathology revealed a 1.1 cm grade I invasive ductal carcinoma. Margins were negative. There was no lymphovascular invasion. Stage was T1cN0M0. Tumor was ER and PR positive and HER-2 negative (2+ by IHC but FISH -). Oncotype DX testing revealed an intermediate score of 21.  She received adjuvant chemotherapy. Chemotherapy was truncated secondary to an allergic reaction to Taxotere. She received TC1 followed by weekly Taxol and Cytoxan(no records available). She received radiation. She received 5 years of Femara(10/26/2008 - 10/2013).  CA27.29 has been followed: 16.4 on 06/20/2011, 23.4 on 01/16/2012, 20.7 on 07/23/2012, 23 on 01/11/2013, 22.5 on 11/25/2013, 20.9 on 04/23/2017, 18.2 on 05/06/2018, 21.2 on 05/05/2019, and 16.2 on 01/11/2020.  Bilateral screening mammogramon 04/12/2019 revealed no evidence of malignancy.  Chest CTon 07/06/2020revealedno acute right rib fracture or evidence of acute traumatic injury to the thorax. There were pulmonary nodules, the majority of which are stable, however11 x 8 mm right lower lobe pulmonary nodule hadincreased in size from 2015 and hadirregular margins.   PET scanon 05/03/2019 revealed a11 x 8 mm nodule along the anterolateral right lung base, slowly progressive from 2015, concerning for indolent primary bronchogenicneoplasm.There was mild hypermetabolism in the right hilar region,favored to be reactive. No findings suspicious for metastatic disease. There was hypermetabolism involving the right 4th, 7th, and 10th  ribs.In the setting of trauma, this appearance is suspicious for healing nondisplaced fractures.  She received SBRT to the RLL nodule from 05/31/2019 - 06/15/2019.  Chest CT on 01/14/2020 revealed a decreased nodularity in the RIGHT lower lobe following SBRT. There was new focus of peripheral consolidation with air bronchograms in the RIGHT middle lobe was favored a focus of infection.  Bone density on 01/27/2014 revealed osteoporosiswith a T score of -3.6 in the AP spine and -3.7 in the right femur. Bone density on 04/02/2016 revealed a T score of -3.4 in the AP spine (L1-L4) and -3.2 in the right femur (improved). Bone density on 04/07/2018 revealed osteoporosis with a T score of -3.4 in the right femur.She stopped Fosamax in 2017.  Symptomatically, she needs assistance with her activities of daily living.  She is unable to undergo mammogram.  Exam reveals tender breasts.  Plan: 1.   Labs today: CBC with diff, ferritin, iron studies, B12, folate. 2.   Stage IA right breast cancer Clinically, she appears to be doing well.  Exam reveals no evidence of recurrent disease.  She is unable to undergo mammogram.  She is11+ yearss/pinitial diagnosis.  CA 27.29 was normal on 01/11/2020. Continue yearly surveillance. 3.   Right lower lobe nodule She was unable to undergo biopsy. She was felt to be a stage I indolent primary lung cancer. She is status post SBRT (completed 06/15/2019. Chest CT on 01/14/2020 revealed decreased nodularity in the RIGHT lower lobe following SBRT.    There was a new focus of peripheral consolidation with air  bronchograms in the RIGHT middle lobe.  Discuss follow-up chest CT.  Patient's husband in agreement. 4.Right mid lung infiltrate   Patient is s/p a course of azithromycin   CXR on 02/16/2020 revealed an unchanged small opacity in the peripheral RML.  5.  Osteoporosis Continue calcium and vitamin D. 6.   Mild normocytic anemia  Hematocrit 35.7.  Hemoglobin 11.2.  MCV 91.1.  Ferritin 121 with an iron saturation of 11% and a TIBC of 277.  Folate 14.5. 7.   Chest CT on 05/03/2020. 8.   RTC after chest CT for MD assess (telephone) and review of chest CT. 9.   RTC in 1 year for MD assessment and labs (CBC with diff, CMP, CA27.29).  I discussed the assessment and treatment plan with the patient.  The patient was provided an opportunity to ask questions and all were answered.  The patient agreed with the plan and demonstrated an understanding of the instructions.  The patient was advised to call back or seek an in person evaluation if the symptoms worsen or if the condition fails to improve as anticipated.  I provided 20 minutes (11:44 AM - 12:04 PM) of face-to-face time during this this encounter and > 50% was spent counseling as documented under my assessment and plan.  An additional 8  minutes were spent reviewing her chart (Epic and Care Everywhere) including notes, labs, and imaging studies.    Nolon Stalls, MD, PhD  04/26/2020, 12:04 PM  I, Jacqualyn Posey, am acting as a Education administrator for [employer]. Mike Gip, MD.   I, Tanazia Achee C. Mike Gip, MD, have reviewed the above documentation for accuracy and completeness, and I agree with the above.

## 2020-04-27 ENCOUNTER — Ambulatory Visit: Payer: Medicare HMO | Admitting: Physical Therapy

## 2020-05-02 ENCOUNTER — Ambulatory Visit: Payer: Medicare HMO | Admitting: Physical Therapy

## 2020-05-02 ENCOUNTER — Other Ambulatory Visit: Payer: Self-pay

## 2020-05-02 ENCOUNTER — Encounter: Payer: Self-pay | Admitting: Physical Therapy

## 2020-05-02 DIAGNOSIS — M25661 Stiffness of right knee, not elsewhere classified: Secondary | ICD-10-CM | POA: Diagnosis not present

## 2020-05-02 DIAGNOSIS — R262 Difficulty in walking, not elsewhere classified: Secondary | ICD-10-CM

## 2020-05-02 DIAGNOSIS — M6281 Muscle weakness (generalized): Secondary | ICD-10-CM

## 2020-05-02 DIAGNOSIS — M25662 Stiffness of left knee, not elsewhere classified: Secondary | ICD-10-CM

## 2020-05-02 NOTE — Therapy (Signed)
[facility] [address] [place], [place], [address] Phone: [phone]   Fax:  [phone]  Physical Therapy Treatment  Patient Details  Name: Cynthia Walker MRN: [id number] Date of Birth: 01/31/1935 Referring Provider (PT): Feldpausch   Encounter Date: 05/02/2020   PT End of Session - 05/02/20 3500    Visit Number 18    Number of Visits 36    Date for PT Re-Evaluation 06/13/20    PT Start Time 1112    PT Stop Time 1206    PT Time Calculation (min) 54 min    Equipment Utilized During Treatment Gait belt    Activity Tolerance Patient tolerated treatment well;Other (comment)   Tx limited 2/2 to attention span   Behavior During Therapy [facility] for tasks assessed/performed   Highly distractable          Past Medical History:  Diagnosis Date  . Breast cancer [facility]) 2009   Right breast CA with lumpectomy, radiation and chemo tx's.  . Dementia ([place])   . Hypertension   . Personal history of chemotherapy   . Personal history of radiation therapy     Past Surgical History:  Procedure Laterality Date  . BREAST BIOPSY Right 03/15/2008   invasive mammary carcinoma  . BREAST LUMPECTOMY Right 04/14/2008   Pathology revealed a 1.1 cm grade I invasive ductal carcinoma. Margins were negative.   . COLONOSCOPY WITH PROPOFOL N/A 07/10/2015   Procedure: COLONOSCOPY WITH PROPOFOL;  Surgeon: Hulen Luster, MD;  Location: [facility] ENDOSCOPY;  Service: Gastroenterology;  Laterality: N/A;  . INTRAMEDULLARY (IM) NAIL INTERTROCHANTERIC Left 06/27/2019   Procedure: INTRAMEDULLARY (IM) NAIL INTERTROCHANTRIC;  Surgeon: Creig Hines, MD;  Location: [facility] ORS;  Service: Orthopedics;  Laterality: Left;  . TONSILLECTOMY     age of 85 years old    There were no vitals filed for this visit.   Subjective Assessment - 05/02/20 1230    Subjective Patient presents to clinic 13 minutes late, but alert and in good spirits. Patient and spouse deny any falls since  last session and notes no significant concerns or changes.    Patient is accompained by: Family member   husband, [name]   Pertinent History Femur fracture, left (CMS-HCC) 06/26/2019; Patient was ambulatory without AD prior to the fall that resulted in fx. Patient's spouse notes that she was capable of walking ~0.5 miles prior to the fall. Patient's spouse reports that she can at present stand for up to 10 min, but not consistently and not without assistance. While working with HHPT patient was able to ambulate with a RW for ~ 200 feet with rest breaks throughout.    Limitations Lifting;Standing;Walking;House hold activities    How long can you sit comfortably? Unlimited    How long can you stand comfortably? 5-10 min with support    How long can you walk comfortably? < 200 feet    Patient Stated Goals 2/2 to cognitive state, patient did not state speicifc goals. Spouse would like to see her ambulatory within the house.    Currently in Pain? No/denies           TREATMENT  Neuromuscular Re-education: // bars: STS, BUE support, SBA, x2 with min encouragement and reorientation to task Gait, SBA x1 with w/c follow, BUE support, with mod/min encouragement and VCs for sequencing, safety, and attention to task Static standing balance with BUE support, SBA, x4 min with mod encouragement to remain standing  Therapeutic Exercise: Seated hamstring stretch, BLE,  2 min x2 each leg for improved ability to stand  Patient educated throughout session on appropriate technique and form using multi-modal cueing, HEP, and activity modification. Patient articulated understanding and returned demonstration.  Patient Response to interventions: Patient reports she is "done for the day" with a wink.  ASSESSMENT Patient presents to clinic alert and willing to participate in therapy. Patient demonstrates deficits in cognition, posture, BLE strength, gait, balance, and function. Patient has made considerable progress  toward her goals with improved ROM at B knees, standing tolerance to 4 min with BUE support, and STS with BUE support and min VCs for sequencing during today's session and responded well to active interventions. Patient's condition has the potential to improve in response to therapy. Maximum improvement is yet to be obtained. The anticipated improvement is attainable and reasonable in a generally predictable time. Patient will benefit from continued skilled therapeutic intervention to address deficits in cognition, posture, BLE strength, gait, balance, and function in order to increase function and improve overall QOL.   PT Long Term Goals - 05/02/20 1233      PT LONG TERM GOAL #1   Title Patient will be modified independent with spousal support with HEP for improved strength and balance in order to decrease fall risk and dependence at home and in the community.    Baseline IE: not demonstrated; 6/15: good cargegiver participation and understanding    Time 12    Period Weeks    Status Achieved      PT LONG TERM GOAL #2   Title Patient will demonstrate improved function as evidenced by a score of 44 on FOTO measure for full participation in activities at home and in the community.    Baseline IE: 16; 6/15: deferred 2/2 to technical issues; 7/27: 38    Time 6    Period Weeks    Status On-going    Target Date 06/13/20      PT LONG TERM GOAL #3   Title Patient will perform STS with BUE support with Modified IND/SBA with VCs for sequencing and safety in order to participate more fully in basic ADLs.    Baseline IE: Mod/MinA, MAX VCs and TCs for sequencing/safety; 6/15: SBA, BUE // bar support, Min/Mod VCs for sequencing; 7/27: SBA, BUE // bar support, mod encouragement for participation    Time 12    Period Weeks    Status Achieved      PT LONG TERM GOAL #4   Title Patient will demonstrate improved B knee extension ROM as evidenced by lacking < 10 degrees of B knee extension PROM/AROM in order  to achieve standing posture with greater independence.    Baseline IE: B lacking 15-20 degrees of knee extension; 6/15: R 0 degrees, L lacking 5 degrees    Time 12    Period Weeks    Status Achieved      PT LONG TERM GOAL #5   Title Patient will demonstrate improved static standing balance as evidenced by ability to maintain standing posture with CGA/SBA, SUE support for 4 minutes in order to participate in basic ADLs.    Baseline IE: 90 sec, MinA, BUE support; 6/15: 2.5 min, CGA, BUE support; 7/27: 4 min, SBA, BUE support    Time 6    Period Weeks    Status Partially Met    Target Date 06/13/20      Additional Long Term Goals   Additional Long Term Goals Yes      PT LONG  TERM GOAL #6   Title Patient will be able to ambulate with RW ~ 25 feet, CGA and VCs for improved ability to participate in ADLs within the home.    Baseline 7/27: // bars 6 feet, SBA, max VCs/encouragement    Time 6    Period Weeks    Status New    Target Date 06/13/20                 Plan - 05/02/20 1233    Clinical Impression Statement Patient presents to clinic alert and willing to participate in therapy. Patient demonstrates deficits in cognition, posture, BLE strength, gait, balance, and function. Patient has made considerable progress toward her goals with improved ROM at B knees, standing tolerance to 4 min with BUE support, and STS with BUE support and min VCs for sequencing during today's session and responded well to active interventions. Patient's condition has the potential to improve in response to therapy. Maximum improvement is yet to be obtained. The anticipated improvement is attainable and reasonable in a generally predictable time. Patient will benefit from continued skilled therapeutic intervention to address deficits in cognition, posture, BLE strength, gait, balance, and function in order to increase function and improve overall QOL.    Personal Factors and Comorbidities  Age;Education;Comorbidity 3+;Past/Current Experience;Time since onset of injury/illness/exacerbation;Fitness;Transportation;Other   Cognitive State   Comorbidities anemia, breast cancer, chronic constipation, diverticulosis, hypertension, hyperlipidemia, osteoporosis    Examination-Activity Limitations Bathing;Dressing;Transfers;Hygiene/Grooming;Bed Mobility;Squat;Lift;Bend;Locomotion Level;Stairs;Reach Overhead;Stand;Toileting;Carry    Examination-Participation Restrictions Church;Interpersonal Relationship;Personal Finances;Yard Work;Cleaning;Laundry;Community Activity;Medication Management;Shop;Driving;Meal Prep    Stability/Clinical Decision Making Evolving/Moderate complexity    Rehab Potential Fair    PT Frequency 2x / week    PT Duration 6 weeks    PT Treatment/Interventions Moist Heat;DME Instruction;Therapeutic activities;Functional mobility training;Stair training;Gait training;Therapeutic exercise;Balance training;Neuromuscular re-education;Cognitive remediation;Orthotic Fit/Training;Patient/family education;Manual techniques;Scar mobilization;Passive range of motion;Energy conservation;Taping    PT Next Visit Plan gait in // bars    PT Home Exercise Plan hamstring stretch    Consulted and Agree with Plan of Care Patient;Family member/caregiver    Family Member Consulted Runell Kovich, husband           Patient will benefit from skilled therapeutic intervention in order to improve the following deficits and impairments:  Abnormal gait, Decreased balance, Decreased endurance, Decreased mobility, Difficulty walking, Hypomobility, Postural dysfunction, Improper body mechanics, Increased edema, Decreased range of motion, Decreased cognition, Decreased activity tolerance, Decreased coordination, Decreased safety awareness, Decreased strength, Impaired flexibility  Visit Diagnosis: Decreased range of motion (ROM) of both knees - Plan: PT plan of care cert/re-cert  Muscle weakness  (generalized) - Plan: PT plan of care cert/re-cert  Difficulty in walking, not elsewhere classified - Plan: PT plan of care cert/re-cert     Problem List Patient Active Problem List   Diagnosis Date Noted  . Normocytic anemia 04/26/2020  . Encounter for hospice care discussion   . MRSA bacteremia 02/17/2020  . Streptococcal bacteremia 02/17/2020  . Bacteremia 02/17/2020  . History of breast cancer 02/17/2020  . Goals of care, counseling/discussion   . Palliative care by specialist   . DNR (do not resuscitate) discussion   . Breast pain, right 01/11/2020  . Femoral neck fracture ([place]) 06/27/2019  . Femur fracture, left ([place]) 06/26/2019  . Right lower lobe pulmonary nodule 05/04/2019  . Syncope 09/27/2017  . Atypical chest pain 09/03/2017  . Essential hypertension 09/03/2017  . Short-term memory loss 09/03/2017  . Osteoporosis 04/02/2016  . Breast cancer, right ([place]) 04/06/2008   Analia Zuk  Wenda Low, DPT [phone] 05/02/2020, 12:46 PM  [facility] [address] [place], [place], [address] Phone: [phone]   Fax:  [phone]  Name: LYNELLE WEILER MRN: [id number] Date of Birth: 05-10-1935

## 2020-05-03 ENCOUNTER — Ambulatory Visit
Admission: RE | Admit: 2020-05-03 | Discharge: 2020-05-03 | Disposition: A | Discharge status: Home / Self Care | Payer: Medicare HMO | Source: Ambulatory Visit | Attending: Hematology and Oncology | Admitting: Hematology and Oncology

## 2020-05-03 DIAGNOSIS — R911 Solitary pulmonary nodule: Secondary | ICD-10-CM | POA: Diagnosis not present

## 2020-05-03 DIAGNOSIS — R918 Other nonspecific abnormal finding of lung field: Secondary | ICD-10-CM | POA: Insufficient documentation

## 2020-05-04 ENCOUNTER — Encounter: Payer: Medicare HMO | Admitting: Physical Therapy

## 2020-05-09 ENCOUNTER — Encounter: Payer: Self-pay | Admitting: Physical Therapy

## 2020-05-09 ENCOUNTER — Encounter: Payer: Medicare HMO | Admitting: Physical Therapy

## 2020-05-09 ENCOUNTER — Ambulatory Visit: Payer: Medicare HMO | Attending: Family Medicine | Admitting: Physical Therapy

## 2020-05-09 ENCOUNTER — Other Ambulatory Visit: Payer: Self-pay

## 2020-05-09 DIAGNOSIS — M25661 Stiffness of right knee, not elsewhere classified: Secondary | ICD-10-CM | POA: Diagnosis not present

## 2020-05-09 DIAGNOSIS — R262 Difficulty in walking, not elsewhere classified: Secondary | ICD-10-CM | POA: Insufficient documentation

## 2020-05-09 DIAGNOSIS — M6281 Muscle weakness (generalized): Secondary | ICD-10-CM | POA: Diagnosis present

## 2020-05-09 DIAGNOSIS — M25662 Stiffness of left knee, not elsewhere classified: Secondary | ICD-10-CM | POA: Diagnosis present

## 2020-05-09 NOTE — Therapy (Signed)
[facility] [address] [place], [place], [address] Phone: [phone]   Fax:  [phone]  Physical Therapy Treatment  Patient Details  Name: Cynthia Walker MRN: [id number] Date of Birth: [birth year] Referring Provider (PT): Feldpausch   Encounter Date: 05/09/2020   PT End of Session - 05/09/20 1236    Visit Number 19    Number of Visits 36    Date for PT Re-Evaluation 06/13/20    PT Start Time 1115    PT Stop Time 1205    PT Time Calculation (min) 50 min    Equipment Utilized During Treatment Gait belt    Activity Tolerance Patient tolerated treatment well;Other (comment)   Tx limited 2/2 to attention span   Behavior During Therapy [facility] for tasks assessed/performed   Highly distractable          Past Medical History:  Diagnosis Date  . Breast cancer [facility]) 2009   Right breast CA with lumpectomy, radiation and chemo tx's.  . Dementia ([facility])   . Hypertension   . Personal history of chemotherapy   . Personal history of radiation therapy     Past Surgical History:  Procedure Laterality Date  . BREAST BIOPSY Right 03/15/2008   invasive mammary carcinoma  . BREAST LUMPECTOMY Right 04/14/2008   Pathology revealed a 1.1 cm grade I invasive ductal carcinoma. Margins were negative.   . COLONOSCOPY WITH PROPOFOL N/A 07/10/2015   Procedure: COLONOSCOPY WITH PROPOFOL;  Surgeon: Wallace Cullens, MD;  Location: [facility] ENDOSCOPY;  Service: Gastroenterology;  Laterality: N/A;  . INTRAMEDULLARY (IM) NAIL INTERTROCHANTERIC Left 06/27/2019   Procedure: INTRAMEDULLARY (IM) NAIL INTERTROCHANTRIC;  Surgeon: Garnette Gunner, MD;  Location: [facility] ORS;  Service: Orthopedics;  Laterality: Left;  . TONSILLECTOMY     age of 85 years old    There were no vitals filed for this visit.   Subjective Assessment - 05/09/20 1429    Subjective Patient presents to clinic 15 minutes late for her appointment. She is alert and participatory in conversation.  Patient and spouse deny any falls since last session and note that she has been more willing to stand holding onto grab bar for BADLs.    Patient is accompained by: Family member   husband, Raiford Noble   Pertinent History Femur fracture, left (CMS-HCC) 06/26/2019; Patient was ambulatory without AD prior to the fall that resulted in fx. Patient's spouse notes that she was capable of walking ~0.5 miles prior to the fall. Patient's spouse reports that she can at present stand for up to 10 min, but not consistently and not without assistance. While working with HHPT patient was able to ambulate with a RW for ~ 200 feet with rest breaks throughout.    Limitations Lifting;Standing;Walking;House hold activities    How long can you sit comfortably? Unlimited    How long can you stand comfortably? 5-10 min with support    How long can you walk comfortably? < 200 feet    Patient Stated Goals 2/2 to cognitive state, patient did not state speicifc goals. Spouse would like to see her ambulatory within the house.    Currently in Pain? No/denies           TREATMENT  Neuromuscular Re-education: // bars: STS, BUE support, CGA/ MinA, x2, x5 with min encouragement and reorientation to task Gait, CGA x1 with w/c follow, BUE support, with min encouragement and VCs for sequencing, safety, and attention to task. Patient coordinating multiple steps in a row  with good reciprocity of movement. Static standing balance with BUE support, SBA, x1 min with mod encouragement to remain standing  Therapeutic Exercise: Seated hamstring stretch, BLE, 2 min x2 each leg for improved ability to stand  Patient educated throughout session on appropriate technique and form using multi-modal cueing, HEP, and activity modification. Patient articulated understanding and returned demonstration.  Patient Response to interventions: Patient reports she has to go at end of session and pretended to fall asleep.  ASSESSMENT Patient presents to  clinic alert and willing to participate in therapy. Patient demonstrates deficits in cognition, posture, BLE strength, gait, balance, and function. Patient demonstrating good reciprocal gait (CGA, //bars BUE support) for ~4 sequential steps with minimal cueing during today's session and responded well to active interventions. Patient will benefit from continued skilled therapeutic intervention to address deficits in cognition, posture, BLE strength, gait, balance, and function in order to increase function and improve overall QOL.     PT Long Term Goals - 05/02/20 1233      PT LONG TERM GOAL #1   Title Patient will be modified independent with spousal support with HEP for improved strength and balance in order to decrease fall risk and dependence at home and in the community.    Baseline IE: not demonstrated; 6/15: good cargegiver participation and understanding    Time 12    Period Weeks    Status Achieved      PT LONG TERM GOAL #2   Title Patient will demonstrate improved function as evidenced by a score of 44 on FOTO measure for full participation in activities at home and in the community.    Baseline IE: 16; 6/15: deferred 2/2 to technical issues; 7/27: 38    Time 6    Period Weeks    Status On-going    Target Date 06/13/20      PT LONG TERM GOAL #3   Title Patient will perform STS with BUE support with Modified IND/SBA with VCs for sequencing and safety in order to participate more fully in basic ADLs.    Baseline IE: Mod/MinA, MAX VCs and TCs for sequencing/safety; 6/15: SBA, BUE // bar support, Min/Mod VCs for sequencing; 7/27: SBA, BUE // bar support, mod encouragement for participation    Time 12    Period Weeks    Status Achieved      PT LONG TERM GOAL #4   Title Patient will demonstrate improved B knee extension ROM as evidenced by lacking < 10 degrees of B knee extension PROM/AROM in order to achieve standing posture with greater independence.    Baseline IE: B lacking  15-20 degrees of knee extension; 6/15: R 0 degrees, L lacking 5 degrees    Time 12    Period Weeks    Status Achieved      PT LONG TERM GOAL #5   Title Patient will demonstrate improved static standing balance as evidenced by ability to maintain standing posture with CGA/SBA, SUE support for 4 minutes in order to participate in basic ADLs.    Baseline IE: 90 sec, MinA, BUE support; 6/15: 2.5 min, CGA, BUE support; 7/27: 4 min, SBA, BUE support    Time 6    Period Weeks    Status Partially Met    Target Date 06/13/20      Additional Long Term Goals   Additional Long Term Goals Yes      PT LONG TERM GOAL #6   Title Patient will be able to ambulate with  RW ~ 25 feet, CGA and VCs for improved ability to participate in ADLs within the home.    Baseline 7/27: // bars 6 feet, SBA, max VCs/encouragement    Time 6    Period Weeks    Status New    Target Date 06/13/20                 Plan - 05/09/20 1257    Clinical Impression Statement Patient presents to clinic alert and willing to participate in therapy. Patient demonstrates deficits in cognition, posture, BLE strength, gait, balance, and function. Patient demonstrating good reciprocal gait (CGA, //bars BUE support) for ~4 sequential steps with minimal cueing during today's session and responded well to active interventions. Patient will benefit from continued skilled therapeutic intervention to address deficits in cognition, posture, BLE strength, gait, balance, and function in order to increase function and improve overall QOL.    Personal Factors and Comorbidities Age;Education;Comorbidity 3+;Past/Current Experience;Time since onset of injury/illness/exacerbation;Fitness;Transportation;Other   Cognitive State   Comorbidities anemia, breast cancer, chronic constipation, diverticulosis, hypertension, hyperlipidemia, osteoporosis    Examination-Activity Limitations Bathing;Dressing;Transfers;Hygiene/Grooming;Bed  Mobility;Squat;Lift;Bend;Locomotion Level;Stairs;Reach Overhead;Stand;Toileting;Carry    Examination-Participation Restrictions Church;Interpersonal Relationship;Personal Finances;Yard Work;Cleaning;Laundry;Community Activity;Medication Management;Shop;Driving;Meal Prep    Stability/Clinical Decision Making Evolving/Moderate complexity    Rehab Potential Fair    PT Frequency 2x / week    PT Duration 6 weeks    PT Treatment/Interventions Moist Heat;DME Instruction;Therapeutic activities;Functional mobility training;Stair training;Gait training;Therapeutic exercise;Balance training;Neuromuscular re-education;Cognitive remediation;Orthotic Fit/Training;Patient/family education;Manual techniques;Scar mobilization;Passive range of motion;Energy conservation;Taping    PT Next Visit Plan gait in // bars    PT Home Exercise Plan hamstring stretch    Consulted and Agree with Plan of Care Patient;Family member/caregiver    Family Member Consulted Hareem Surowiec, husband           Patient will benefit from skilled therapeutic intervention in order to improve the following deficits and impairments:  Abnormal gait, Decreased balance, Decreased endurance, Decreased mobility, Difficulty walking, Hypomobility, Postural dysfunction, Improper body mechanics, Increased edema, Decreased range of motion, Decreased cognition, Decreased activity tolerance, Decreased coordination, Decreased safety awareness, Decreased strength, Impaired flexibility  Visit Diagnosis: Decreased range of motion (ROM) of both knees  Muscle weakness (generalized)  Difficulty in walking, not elsewhere classified     Problem List Patient Active Problem List   Diagnosis Date Noted  . Normocytic anemia 04/26/2020  . Encounter for hospice care discussion   . MRSA bacteremia 02/17/2020  . Streptococcal bacteremia 02/17/2020  . Bacteremia 02/17/2020  . History of breast cancer 02/17/2020  . Goals of care, counseling/discussion   .  Palliative care by specialist   . DNR (do not resuscitate) discussion   . Breast pain, right 01/11/2020  . Femoral neck fracture ([place]) 06/27/2019  . Femur fracture, left ([place]) 06/26/2019  . Right lower lobe pulmonary nodule 05/04/2019  . Syncope 09/27/2017  . Atypical chest pain 09/03/2017  . Essential hypertension 09/03/2017  . Short-term memory loss 09/03/2017  . Osteoporosis 04/02/2016  . Breast cancer, right [facility]) 04/06/2008   Myles Gip PT, DPT [phone] 05/09/2020, 2:34 PM   [facility] [address] [place], [place], [address] Phone: [phone]   Fax:  [phone]  Name: Cynthia Walker MRN: [id number] Date of Birth: 18-May-1935

## 2020-05-11 ENCOUNTER — Encounter: Payer: Self-pay | Admitting: Physical Therapy

## 2020-05-11 ENCOUNTER — Other Ambulatory Visit: Payer: Self-pay

## 2020-05-11 ENCOUNTER — Ambulatory Visit: Payer: Medicare HMO | Admitting: Physical Therapy

## 2020-05-11 DIAGNOSIS — R262 Difficulty in walking, not elsewhere classified: Secondary | ICD-10-CM

## 2020-05-11 DIAGNOSIS — M25661 Stiffness of right knee, not elsewhere classified: Secondary | ICD-10-CM

## 2020-05-11 DIAGNOSIS — M6281 Muscle weakness (generalized): Secondary | ICD-10-CM

## 2020-05-11 NOTE — Therapy (Signed)
[facility] [address]. [place], [place], [address] Phone: [phone]   Fax:  [phone]  Physical Therapy Treatment  Patient Details  Name: Cynthia Walker MRN: [id number] Date of Birth: August 03, 1935 Referring Provider (PT): Feldpausch   Encounter Date: 05/11/2020   PT End of Session - 05/11/20 1436    Visit Number 20    Number of Visits 36    Date for PT Re-Evaluation 06/13/20    PT Start Time 1055    PT Stop Time 1206    PT Time Calculation (min) 71 min    Equipment Utilized During Treatment Gait belt    Activity Tolerance Patient tolerated treatment well;Other (comment)   Tx limited 2/2 to attention span   Behavior During Therapy [facility] for tasks assessed/performed   Highly distractable          Past Medical History:  Diagnosis Date  . Breast cancer [facility]) 2009   Right breast CA with lumpectomy, radiation and chemo tx's.  . Dementia ([place])   . Hypertension   . Personal history of chemotherapy   . Personal history of radiation therapy     Past Surgical History:  Procedure Laterality Date  . BREAST BIOPSY Right 03/15/2008   invasive mammary carcinoma  . BREAST LUMPECTOMY Right 04/14/2008   Pathology revealed a 1.1 cm grade I invasive ductal carcinoma. Margins were negative.   . COLONOSCOPY WITH PROPOFOL N/A 07/10/2015   Procedure: COLONOSCOPY WITH PROPOFOL;  Surgeon: Hulen Luster, MD;  [facility]: [facility] ENDOSCOPY;  Service: Gastroenterology;  Laterality: N/A;  . INTRAMEDULLARY (IM) NAIL INTERTROCHANTERIC Left 06/27/2019   Procedure: INTRAMEDULLARY (IM) NAIL INTERTROCHANTRIC;  Surgeon: Creig Hines, MD;  Location: [facility] ORS;  Service: Orthopedics;  Laterality: Left;  . TONSILLECTOMY     age of 85 years old    There were no vitals filed for this visit.   Subjective Assessment - 05/11/20 1435    Subjective Patient presents to clinic in good spirits and alert. She expresses disinterest in participating in physical therapy  but is amenable with moderate encouragement/conversation.    Patient is accompained by: Family member   husband, Cynthia Walker   Pertinent History Femur fracture, left (CMS-HCC) 06/26/2019; Patient was ambulatory without AD prior to the fall that resulted in fx. Patient's spouse notes that she was capable of walking ~0.5 miles prior to the fall. Patient's spouse reports that she can at present stand for up to 10 min, but not consistently and not without assistance. While working with HHPT patient was able to ambulate with a RW for ~ 200 feet with rest breaks throughout.    Limitations Lifting;Standing;Walking;House hold activities    How long can you sit comfortably? Unlimited    How long can you stand comfortably? 5-10 min with support    How long can you walk comfortably? < 200 feet    Patient Stated Goals 2/2 to cognitive state, patient did not state speicifc goals. Spouse would like to see her ambulatory within the house.    Currently in Pain? No/denies           TREATMENT  Neuromuscular Re-education: // bars: STS, BUE support, SBA, x2, x5 with min encouragement and reorientation to task Gait, CGA x2 with w/c follow, BUE support, with min encouragement and VCs for sequencing, safety, and attention to task. Patient coordinating multiple steps in a row with good reciprocity of movement. Static standing balance with BUE support, SBA, x5 min with min encouragement to remain standing  Therapeutic Exercise: Seated hamstring stretch, BLE, 2 min x2 each leg for improved ability to stand  Patient educated throughout session on appropriate technique and form using multi-modal cueing, HEP, and activity modification. Patient articulated understanding and returned demonstration.  Patient Response to interventions: Patient notes that PT should do some of her exercises.  ASSESSMENT Patient presents to clinic alert and willing to participate in therapy. Patient demonstrates deficits in cognition, posture,  BLE strength, gait, balance, and function. Patient demonstrating good reciprocal gait (CGA, //bars BUE support) for sequential steps with minimal cueing and was able to stand engaged in conversation for 5 min with BUE support , SBA during today's session and responded well to active interventions. Patient will benefit from continued skilled therapeutic intervention to address deficits in cognition, posture, BLE strength, gait, balance, and function in order to increase function and improve overall QOL.    PT Long Term Goals - 05/02/20 1233      PT LONG TERM GOAL #1   Title Patient will be modified independent with spousal support with HEP for improved strength and balance in order to decrease fall risk and dependence at home and in the community.    Baseline IE: not demonstrated; 6/15: good cargegiver participation and understanding    Time 12    Period Weeks    Status Achieved      PT LONG TERM GOAL #2   Title Patient will demonstrate improved function as evidenced by a score of 44 on FOTO measure for full participation in activities at home and in the community.    Baseline IE: 16; 6/15: deferred 2/2 to technical issues; 7/27: 38    Time 6    Period Weeks    Status On-going    Target Date 06/13/20      PT LONG TERM GOAL #3   Title Patient will perform STS with BUE support with Modified IND/SBA with VCs for sequencing and safety in order to participate more fully in basic ADLs.    Baseline IE: Mod/MinA, MAX VCs and TCs for sequencing/safety; 6/15: SBA, BUE // bar support, Min/Mod VCs for sequencing; 7/27: SBA, BUE // bar support, mod encouragement for participation    Time 12    Period Weeks    Status Achieved      PT LONG TERM GOAL #4   Title Patient will demonstrate improved B knee extension ROM as evidenced by lacking < 10 degrees of B knee extension PROM/AROM in order to achieve standing posture with greater independence.    Baseline IE: B lacking 15-20 degrees of knee extension;  6/15: R 0 degrees, L lacking 5 degrees    Time 12    Period Weeks    Status Achieved      PT LONG TERM GOAL #5   Title Patient will demonstrate improved static standing balance as evidenced by ability to maintain standing posture with CGA/SBA, SUE support for 4 minutes in order to participate in basic ADLs.    Baseline IE: 90 sec, MinA, BUE support; 6/15: 2.5 min, CGA, BUE support; 7/27: 4 min, SBA, BUE support    Time 6    Period Weeks    Status Partially Met    Target Date 06/13/20      Additional Long Term Goals   Additional Long Term Goals Yes      PT LONG TERM GOAL #6   Title Patient will be able to ambulate with RW ~ 25 feet, CGA and VCs for improved ability to participate  in ADLs within the home.    Baseline 7/27: // bars 6 feet, SBA, max VCs/encouragement    Time 6    Period Weeks    Status New    Target Date 06/13/20                 Plan - 05/11/20 1436    Clinical Impression Statement Patient presents to clinic alert and willing to participate in therapy. Patient demonstrates deficits in cognition, posture, BLE strength, gait, balance, and function. Patient demonstrating good reciprocal gait (CGA, //bars BUE support) for sequential steps with minimal cueing and was able to stand engaged in conversation for 5 min with BUE support , SBA during today's session and responded well to active interventions. Patient will benefit from continued skilled therapeutic intervention to address deficits in cognition, posture, BLE strength, gait, balance, and function in order to increase function and improve overall QOL.    Personal Factors and Comorbidities Age;Education;Comorbidity 3+;Past/Current Experience;Time since onset of injury/illness/exacerbation;Fitness;Transportation;Other   Cognitive State   Comorbidities anemia, breast cancer, chronic constipation, diverticulosis, hypertension, hyperlipidemia, osteoporosis    Examination-Activity Limitations  Bathing;Dressing;Transfers;Hygiene/Grooming;Bed Mobility;Squat;Lift;Bend;Locomotion Level;Stairs;Reach Overhead;Stand;Toileting;Carry    Examination-Participation Restrictions Church;Interpersonal Relationship;Personal Finances;Yard Work;Cleaning;Laundry;Community Activity;Medication Management;Shop;Driving;Meal Prep    Stability/Clinical Decision Making Evolving/Moderate complexity    Rehab Potential Fair    PT Frequency 2x / week    PT Duration 6 weeks    PT Treatment/Interventions Moist Heat;DME Instruction;Therapeutic activities;Functional mobility training;Stair training;Gait training;Therapeutic exercise;Balance training;Neuromuscular re-education;Cognitive remediation;Orthotic Fit/Training;Patient/family education;Manual techniques;Scar mobilization;Passive range of motion;Energy conservation;Taping    PT Next Visit Plan gait in // bars    PT Home Exercise Plan hamstring stretch    Consulted and Agree with Plan of Care Patient;Family member/caregiver    Family Member Consulted Meloney Feld, husband           Patient will benefit from skilled therapeutic intervention in order to improve the following deficits and impairments:  Abnormal gait, Decreased balance, Decreased endurance, Decreased mobility, Difficulty walking, Hypomobility, Postural dysfunction, Improper body mechanics, Increased edema, Decreased range of motion, Decreased cognition, Decreased activity tolerance, Decreased coordination, Decreased safety awareness, Decreased strength, Impaired flexibility  Visit Diagnosis: Decreased range of motion (ROM) of both knees  Muscle weakness (generalized)  Difficulty in walking, not elsewhere classified     Problem List Patient Active Problem List   Diagnosis Date Noted  . Normocytic anemia 04/26/2020  . Encounter for hospice care discussion   . MRSA bacteremia 02/17/2020  . Streptococcal bacteremia 02/17/2020  . Bacteremia 02/17/2020  . History of breast cancer  02/17/2020  . Goals of care, counseling/discussion   . Palliative care by specialist   . DNR (do not resuscitate) discussion   . Breast pain, right 01/11/2020  . Femoral neck fracture ([place]) 06/27/2019  . Femur fracture, left ([place]) 06/26/2019  . Right lower lobe pulmonary nodule 05/04/2019  . Syncope 09/27/2017  . Atypical chest pain 09/03/2017  . Essential hypertension 09/03/2017  . Short-term memory loss 09/03/2017  . Osteoporosis 04/02/2016  . Breast cancer, right [facility]) 04/06/2008   Myles Gip PT, DPT [phone] 05/11/2020, 2:47 PM  [facility] [address] [place], [place], [address] Phone: [phone]   Fax:  [phone]  Name: Cynthia Walker MRN: [id number] Date of Birth: 06/08/1935

## 2020-05-16 ENCOUNTER — Ambulatory Visit: Payer: Medicare HMO | Admitting: Physical Therapy

## 2020-05-16 ENCOUNTER — Other Ambulatory Visit: Payer: Self-pay

## 2020-05-16 ENCOUNTER — Encounter: Payer: Self-pay | Admitting: Physical Therapy

## 2020-05-16 DIAGNOSIS — M6281 Muscle weakness (generalized): Secondary | ICD-10-CM

## 2020-05-16 DIAGNOSIS — R262 Difficulty in walking, not elsewhere classified: Secondary | ICD-10-CM

## 2020-05-16 DIAGNOSIS — M25661 Stiffness of right knee, not elsewhere classified: Secondary | ICD-10-CM

## 2020-05-16 NOTE — Therapy (Signed)
[facility] [address] [place], [place], [address] Phone: [phone]   Fax:  [phone]  Physical Therapy Treatment  Patient Details  Name: LAVELL RIDINGS MRN: [id number] Date of Birth: [birth year] Referring Provider (PT): Feldpausch   Encounter Date: 05/16/2020   PT End of Session - 05/16/20 1514    Visit Number 21    Number of Visits 36    Date for PT Re-Evaluation 06/13/20    PT Start Time 1112    PT Stop Time 1200    PT Time Calculation (min) 48 min    Equipment Utilized During Treatment Gait belt    Activity Tolerance Patient tolerated treatment well;Other (comment)   Tx limited 2/2 to attention span   Behavior During Therapy [facility] for tasks assessed/performed   Highly distractable          Past Medical History:  Diagnosis Date   Breast cancer ([place]) 2009   Right breast CA with lumpectomy, radiation and chemo tx's.   Dementia ([place])    Hypertension    Personal history of chemotherapy    Personal history of radiation therapy     Past Surgical History:  Procedure Laterality Date   BREAST BIOPSY Right 03/15/2008   invasive mammary carcinoma   BREAST LUMPECTOMY Right 04/14/2008   Pathology revealed a 1.1 cm grade I invasive ductal carcinoma. Margins were negative.    COLONOSCOPY WITH PROPOFOL N/A 07/10/2015   Procedure: COLONOSCOPY WITH PROPOFOL;  Surgeon: Hulen Luster, MD;  Location: [facility] ENDOSCOPY;  Service: Gastroenterology;  Laterality: N/A;   INTRAMEDULLARY (IM) NAIL INTERTROCHANTERIC Left 06/27/2019   Procedure: INTRAMEDULLARY (IM) NAIL INTERTROCHANTRIC;  Surgeon: Creig Hines, MD;  Location: [facility] ORS;  Service: Orthopedics;  Laterality: Left;   TONSILLECTOMY     age of 85 years old    There were no vitals filed for this visit.   Subjective Assessment - 05/16/20 1513    Subjective Patient presents to clinic awake but subdued. Patient's spouse notes that she has been up since 6A and is not quite  herself. Patient also notes that she doesn't feel quite herself today.    Patient is accompained by: Family member   husband, [name]   Pertinent History Femur fracture, left (CMS-HCC) 06/26/2019; Patient was ambulatory without AD prior to the fall that resulted in fx. Patient's spouse notes that she was capable of walking ~0.5 miles prior to the fall. Patient's spouse reports that she can at present stand for up to 10 min, but not consistently and not without assistance. While working with HHPT patient was able to ambulate with a RW for ~ 200 feet with rest breaks throughout.    Limitations Lifting;Standing;Walking;House hold activities    How long can you sit comfortably? Unlimited    How long can you stand comfortably? 5-10 min with support    How long can you walk comfortably? < 200 feet    Patient Stated Goals 2/2 to cognitive state, patient did not state speicifc goals. Spouse would like to see her ambulatory within the house.    Currently in Pain? No/denies           TREATMENT  Neuromuscular Re-education: // bars: STS, BUE support, SBA, x2, with mod to max encouragement and reorientation to task Gait, MinA x2 with w/c follow, BUE support, with mod encouragement and VCs for sequencing, safety, and attention to task.   Therapeutic Exercise: Seated hamstring stretch, BLE, 2 min x2 each leg for improved ability to  stand  Patient educated throughout session on appropriate technique and form using multi-modal cueing, HEP, and activity modification. Patient articulated understanding and returned demonstration.  Patient Response to interventions: Patient notes that she feels done for today.  ASSESSMENT Patient presents to clinic alert and willing to participate in therapy. Patient demonstrates deficits in cognition, posture, BLE strength, gait, balance, and function. Patient requiring moderate to max encouragement for participation with limited results during today's session. Patient will  benefit from continued skilled therapeutic intervention to address deficits in cognition, posture, BLE strength, gait, balance, and function in order to increase function and improve overall QOL.     PT Long Term Goals - 05/02/20 1233      PT LONG TERM GOAL #1   Title Patient will be modified independent with spousal support with HEP for improved strength and balance in order to decrease fall risk and dependence at home and in the community.    Baseline IE: not demonstrated; 6/15: good cargegiver participation and understanding    Time 12    Period Weeks    Status Achieved      PT LONG TERM GOAL #2   Title Patient will demonstrate improved function as evidenced by a score of 44 on FOTO measure for full participation in activities at home and in the community.    Baseline IE: 16; 6/15: deferred 2/2 to technical issues; 7/27: 38    Time 6    Period Weeks    Status On-going    Target Date 06/13/20      PT LONG TERM GOAL #3   Title Patient will perform STS with BUE support with Modified IND/SBA with VCs for sequencing and safety in order to participate more fully in basic ADLs.    Baseline IE: Mod/MinA, MAX VCs and TCs for sequencing/safety; 6/15: SBA, BUE // bar support, Min/Mod VCs for sequencing; 7/27: SBA, BUE // bar support, mod encouragement for participation    Time 12    Period Weeks    Status Achieved      PT LONG TERM GOAL #4   Title Patient will demonstrate improved B knee extension ROM as evidenced by lacking < 10 degrees of B knee extension PROM/AROM in order to achieve standing posture with greater independence.    Baseline IE: B lacking 15-20 degrees of knee extension; 6/15: R 0 degrees, L lacking 5 degrees    Time 12    Period Weeks    Status Achieved      PT LONG TERM GOAL #5   Title Patient will demonstrate improved static standing balance as evidenced by ability to maintain standing posture with CGA/SBA, SUE support for 4 minutes in order to participate in basic  ADLs.    Baseline IE: 90 sec, MinA, BUE support; 6/15: 2.5 min, CGA, BUE support; 7/27: 4 min, SBA, BUE support    Time 6    Period Weeks    Status Partially Met    Target Date 06/13/20      Additional Long Term Goals   Additional Long Term Goals Yes      PT LONG TERM GOAL #6   Title Patient will be able to ambulate with RW ~ 25 feet, CGA and VCs for improved ability to participate in ADLs within the home.    Baseline 7/27: // bars 6 feet, SBA, max VCs/encouragement    Time 6    Period Weeks    Status New    Target Date 06/13/20  Plan - 05/16/20 1515    Clinical Impression Statement Patient presents to clinic alert and willing to participate in therapy. Patient demonstrates deficits in cognition, posture, BLE strength, gait, balance, and function. Patient requiring moderate to max encouragement for participation with limited results during today's session. Patient will benefit from continued skilled therapeutic intervention to address deficits in cognition, posture, BLE strength, gait, balance, and function in order to increase function and improve overall QOL.    Personal Factors and Comorbidities Age;Education;Comorbidity 3+;Past/Current Experience;Time since onset of injury/illness/exacerbation;Fitness;Transportation;Other   Cognitive State   Comorbidities anemia, breast cancer, chronic constipation, diverticulosis, hypertension, hyperlipidemia, osteoporosis    Examination-Activity Limitations Bathing;Dressing;Transfers;Hygiene/Grooming;Bed Mobility;Squat;Lift;Bend;Locomotion Level;Stairs;Reach Overhead;Stand;Toileting;Carry    Examination-Participation Restrictions Church;Interpersonal Relationship;Personal Finances;Yard Work;Cleaning;Laundry;Community Activity;Medication Management;Shop;Driving;Meal Prep    Stability/Clinical Decision Making Evolving/Moderate complexity    Rehab Potential Fair    PT Frequency 2x / week    PT Duration 6 weeks    PT  Treatment/Interventions Moist Heat;DME Instruction;Therapeutic activities;Functional mobility training;Stair training;Gait training;Therapeutic exercise;Balance training;Neuromuscular re-education;Cognitive remediation;Orthotic Fit/Training;Patient/family education;Manual techniques;Scar mobilization;Passive range of motion;Energy conservation;Taping    PT Next Visit Plan gait in // bars    PT Home Exercise Plan hamstring stretch    Consulted and Agree with Plan of Care Patient;Family member/caregiver    Family Member Consulted Kyri Shader, husband           Patient will benefit from skilled therapeutic intervention in order to improve the following deficits and impairments:  Abnormal gait, Decreased balance, Decreased endurance, Decreased mobility, Difficulty walking, Hypomobility, Postural dysfunction, Improper body mechanics, Increased edema, Decreased range of motion, Decreased cognition, Decreased activity tolerance, Decreased coordination, Decreased safety awareness, Decreased strength, Impaired flexibility  Visit Diagnosis: Decreased range of motion (ROM) of both knees  Muscle weakness (generalized)  Difficulty in walking, not elsewhere classified     Problem List Patient Active Problem List   Diagnosis Date Noted   Normocytic anemia 04/26/2020   Encounter for hospice care discussion    MRSA bacteremia 02/17/2020   Streptococcal bacteremia 02/17/2020   Bacteremia 02/17/2020   History of breast cancer 02/17/2020   Goals of care, counseling/discussion    Palliative care by specialist    DNR (do not resuscitate) discussion    Breast pain, right 01/11/2020   Femoral neck fracture ([place]) 06/27/2019   Femur fracture, left ([place]) 06/26/2019   Right lower lobe pulmonary nodule 05/04/2019   Syncope 09/27/2017   Atypical chest pain 09/03/2017   Essential hypertension 09/03/2017   Short-term memory loss 09/03/2017   Osteoporosis 04/02/2016   Breast cancer,  right ([place]) 04/06/2008   Myles Gip PT, DPT [phone] 05/16/2020, 3:19 PM  [facility] [address] [place], [place], [address] Phone: [phone]   Fax:  [phone]  Name: DOMINI VANDEHEI MRN: [id number] Date of Birth: 1935/10/05

## 2020-05-19 ENCOUNTER — Other Ambulatory Visit: Payer: Self-pay

## 2020-05-19 ENCOUNTER — Other Ambulatory Visit: Payer: Medicare HMO | Admitting: Primary Care

## 2020-05-22 ENCOUNTER — Other Ambulatory Visit: Payer: Medicare HMO | Admitting: Primary Care

## 2020-05-22 ENCOUNTER — Other Ambulatory Visit: Payer: Self-pay

## 2020-05-22 DIAGNOSIS — Z515 Encounter for palliative care: Secondary | ICD-10-CM

## 2020-05-22 DIAGNOSIS — R413 Other amnesia: Secondary | ICD-10-CM

## 2020-05-22 NOTE — Progress Notes (Signed)
[place] Consult Note Telephone: [phone]  Fax: [phone]  PATIENT NAME: Cynthia Walker [id number] (home)  DOB: 30-Apr-1935 MRN: [id number]  PRIMARY CARE PROVIDER:    Sofie Hartigan, MD,  [place] [facility] [place] [address] [phone]  REFERRING PROVIDER:   Sofie Hartigan, [place],  [place] [address] [phone]  RESPONSIBLE PARTY:   Extended Emergency Contact Information Primary Emergency Contact: Castellanos,Richard E Address: [place], [place] [address] Johnnette Litter of [place] Phone: [phone] Mobile Phone: [phone] Relation: Spouse Secondary Emergency Contact: Triola,Janet Address: [address]          [place], [place] [address] Johnnette Litter of [place] Phone: [phone] Relation: Daughter  I met face to face with patient and family in home.  ASSESSMENT AND RECOMMENDATIONS:   1. Advance Care Planning/Goals of Care: Goals include to maximize quality of life and symptom management.  Revisited MOST, still declines to complete due to having this information in a trust.  We discussed how MOST was directive for EMS to treat unforeseen Walker. We discussed Mr Din recent health issues and a plan he may have for Cynthia Walker. He states he does not want that to happen. When he was in the hospital a few days he got some extra in home help.   Pt remains Full code. We discussed goals for care such as not treating a recurrence of cancer. He states he did not think they would but wants to f/u with scans. A note from oncology states he does decline further mammography surveillance.   2. Symptom Management:   Mobility: Resuming PT due to hope for continued improvement. Not walking in home. Discussed toilet transfers, still having some trouble with transfers.  Intake: Eats 50-75% of food, Taking 32 oz fluids. Takes  MVI sporadically. Wt appears stable at 130 but I did ask him to keep an eye on this when they are at [facility] with platform scales.   Medications: Reviewed,  Has stopped several including sertraline. Discussed usefulness to take sertraline ongoing.   I would recommend sertraline over Aricept for QOL goals .  3. Follow up Palliative Care Visit: Palliative care will continue to follow for goals of care clarification and symptom management. Return to 12 weeks or prn.  4. Family /Caregiver/Community Supports: Lives with husband in own home. He states things are going better. Has privately hired care givers. Daughters and son involved in her care. Daughter in [place] called while I was at the home.   5. Cognitive / Functional decline: A and O x 1, Not able to walk, transfers with help. Able to feed self, drink with cueing.  I spent 60 minutes providing this consultation,  from 1300 to 1400. More than 50% of the time in this consultation was spent coordinating communication.   CHIEF COMPLAINT: Advancing dementia  HISTORY OF PRESENT ILLNESS:  [name] is a 85 y.o. year old female with multiple medical problems including dementia with behavior disturbances, depression,  FTT.  Palliative Care was asked to follow this patient by consultation request of Feldpausch, Chrissie Noa, MD to help address advance care planning and goals of care. This is a follow up visit.  CODE STATUS: FULL CODE  PPS: 50%  HOSPICE ELIGIBILITY/DIAGNOSIS: no  PAST MEDICAL HISTORY:  Past Medical History:  Diagnosis Date  .  Breast cancer [facility]) 2009   Right breast CA with lumpectomy, radiation and chemo tx's.  . Dementia ([place])   . Hypertension   . Personal history of chemotherapy   . Personal history of radiation therapy     SOCIAL HX:  Social History   Tobacco Use  . Smoking status: Never Smoker  . Smokeless tobacco: Never Used  Substance Use Topics  . Alcohol use: No   FAMILY HX:  Family History  Problem Relation  Age of Onset  . Cancer Brother        lung cancer  . Cancer Sister        not sure  . Breast cancer Sister   . Heart disease Father   . Hypertension Father     ALLERGIES:  Allergies  Allergen Reactions  . Flu Virus Vaccine     Other reaction(s): Other (See Comments) Stomach cramps  . Latex Swelling  . Other     Other reaction(s): Unknown Allergy to eggs  . Procaine Other (See Comments)    weakness  . Soy Isoflavones     Other reaction(s): Other (See Comments) Soy causes legs to feel like rubber  . Penicillins Rash    Has patient had a PCN reaction causing immediate rash, facial/tongue/throat swelling, SOB or lightheadedness with hypotension: No Has patient had a PCN reaction causing severe rash involving mucus membranes or skin necrosis: No Has patient had a PCN reaction that required hospitalization: No Has patient had a PCN reaction occurring within the last 10 years: No If all of the above answers are "NO", then may proceed with Cephalosporin use.      PERTINENT MEDICATIONS:  Outpatient Encounter Medications as of 05/22/2020  Medication Sig  . donepezil (ARICEPT) 5 MG tablet Take 5 mg by mouth daily.  . Multiple Vitamin (MULTI-VITAMINS) TABS Take by mouth.  . QUEtiapine (SEROQUEL) 25 MG tablet Take 25 mg by mouth daily. At 4 pm. Compounded into suspension  . sertraline (ZOLOFT) 50 MG tablet Take 75 mg by mouth daily.  (Patient not taking: Reported on 05/22/2020)  . [DISCONTINUED] furosemide (LASIX) 20 MG tablet Take 20 mg by mouth daily.   No facility-administered encounter medications on file as of 05/22/2020.     PHYSICAL EXAM / ROS:   Current and past weights: 130 lbs General: NAD, frail appearing, thin Cardiovascular: no chest pain reported, sl LE  edema  Pulmonary: no cough, no increased SOB, room air Abdomen: appetite fair, incontinent of bowel at times GU: denies dysuria, incontinent of urine at times  MSK:  ++ joint and ROM abnormalities, non ambulatory,  sits in chair unsupported Skin: no rashes or wounds reported Neurological: Weakness, dementia FAST score 7A. Pt wary of interviewr   Jason Coop, NP , DNP, MPH, [facility]  COVID-19 PATIENT SCREENING TOOL  Person answering questions: ___________Mr Miller___ _____   1.  Is the patient or any family member in the home showing any signs or symptoms regarding respiratory infection?               Person with Symptom- __________NA_________________  a. Fever  Yes___ No___          ___________________  b. Shortness of breath                                                    Yes___ No___          ___________________ c. Cough/congestion                                       Yes___  No___         ___________________ d. Body aches/pains                                                         Yes___ No___        ____________________ e. Gastrointestinal symptoms (diarrhea, nausea)           Yes___ No___        ____________________  2. Within the past 14 days, has anyone living in the home had any contact with someone with or under investigation for COVID-19?    Yes___ No_X_   Person __________________

## 2020-05-25 ENCOUNTER — Encounter: Payer: Self-pay | Admitting: Physical Therapy

## 2020-05-25 ENCOUNTER — Ambulatory Visit: Payer: Medicare HMO | Admitting: Physical Therapy

## 2020-05-25 ENCOUNTER — Other Ambulatory Visit: Payer: Self-pay

## 2020-05-25 DIAGNOSIS — M6281 Muscle weakness (generalized): Secondary | ICD-10-CM

## 2020-05-25 DIAGNOSIS — R262 Difficulty in walking, not elsewhere classified: Secondary | ICD-10-CM

## 2020-05-25 DIAGNOSIS — M25662 Stiffness of left knee, not elsewhere classified: Secondary | ICD-10-CM

## 2020-05-25 DIAGNOSIS — M25661 Stiffness of right knee, not elsewhere classified: Secondary | ICD-10-CM | POA: Diagnosis not present

## 2020-05-25 NOTE — Therapy (Signed)
[place] [facility] [address] [place], [place], [address] Phone: [phone]   Fax:  [phone]  Physical Therapy Treatment  Patient Details  Name: Cynthia Walker MRN: [id number] Date of Birth: 04-16-1935 Referring Provider (PT): Feldpausch   Encounter Date: 05/25/2020   PT End of Session - 05/25/20 1523    Visit Number 22    Number of Visits 36    Date for PT Re-Evaluation 06/13/20    PT Start Time 1300    PT Stop Time 1355    PT Time Calculation (min) 55 min    Equipment Utilized During Treatment Gait belt    Activity Tolerance Patient tolerated treatment well;Other (comment)   Tx limited 2/2 to attention span   Behavior During Therapy [facility] for tasks assessed/performed   Highly distractable          Past Medical History:  Diagnosis Date  . Breast cancer [facility]) 2009   Right breast CA with lumpectomy, radiation and chemo tx's.  . Dementia ([place])   . Hypertension   . Personal history of chemotherapy   . Personal history of radiation therapy     Past Surgical History:  Procedure Laterality Date  . BREAST BIOPSY Right 03/15/2008   invasive mammary carcinoma  . BREAST LUMPECTOMY Right 04/14/2008   Pathology revealed a 1.1 cm grade I invasive ductal carcinoma. Margins were negative.   . COLONOSCOPY WITH PROPOFOL N/A 07/10/2015   Procedure: COLONOSCOPY WITH PROPOFOL;  Surgeon: Hulen Luster, MD;  Location: [facility] ENDOSCOPY;  Service: Gastroenterology;  Laterality: N/A;  . INTRAMEDULLARY (IM) NAIL INTERTROCHANTERIC Left 06/27/2019   Procedure: INTRAMEDULLARY (IM) NAIL INTERTROCHANTRIC;  Surgeon: Creig Hines, MD;  Location: [facility] ORS;  Service: Orthopedics;  Laterality: Left;  . TONSILLECTOMY     age of 85 years old    There were no vitals filed for this visit.   Subjective Assessment - 05/25/20 1518    Subjective Patient presents to clinic alert and oriented to self and spouse. Patient and spouse deny any concerns since her  last session.    Patient is accompained by: Family member   husband, [name]   Pertinent History Femur fracture, left (CMS-HCC) 06/26/2019; Patient was ambulatory without AD prior to the fall that resulted in fx. Patient's spouse notes that she was capable of walking ~0.5 miles prior to the fall. Patient's spouse reports that she can at present stand for up to 10 min, but not consistently and not without assistance. While working with HHPT patient was able to ambulate with a RW for ~ 200 feet with rest breaks throughout.    Limitations Lifting;Standing;Walking;House hold activities    How long can you sit comfortably? Unlimited    How long can you stand comfortably? 5-10 min with support    How long can you walk comfortably? < 200 feet    Patient Stated Goals 2/2 to cognitive state, patient did not state speicifc goals. Spouse would like to see her ambulatory within the house.    Currently in Pain? No/denies           TREATMENT  Neuromuscular Re-education: // bars: STS, BUE support, SBA, x4, with mod to max encouragement and reorientation to task Gait, MinA at L hip/pelvis with w/c follow, BUE support, with mod encouragement and VCs for sequencing, safety, and attention to task.  Static standing with head turns, BUE support, with min encouragement and VCs for safety, x3 min  Therapeutic Exercise: Seated hamstring stretch, BLE, 2 min  x2 each leg for improved ability to stand  Patient educated throughout session on appropriate technique and form using multi-modal cueing, HEP, and activity modification. Patient articulated understanding and returned demonstration.  Patient Response to interventions: Patient states "I'm ready to go."  ASSESSMENT Patient presents to clinic alert and willing to participate in therapy. Patient demonstrates deficits in cognition, posture, BLE strength, gait, balance, and function. Patient coordinating several series of sequential steps (3-6) without prompting  during today's session. Patient will benefit from continued skilled therapeutic intervention to address deficits in cognition, posture, BLE strength, gait, balance, and function in order to increase function and improve overall QOL.      PT Long Term Goals - 05/02/20 1233      PT LONG TERM GOAL #1   Title Patient will be modified independent with spousal support with HEP for improved strength and balance in order to decrease fall risk and dependence at home and in the community.    Baseline IE: not demonstrated; 6/15: good cargegiver participation and understanding    Time 12    Period Weeks    Status Achieved      PT LONG TERM GOAL #2   Title Patient will demonstrate improved function as evidenced by a score of 44 on FOTO measure for full participation in activities at home and in the community.    Baseline IE: 16; 6/15: deferred 2/2 to technical issues; 7/27: 38    Time 6    Period Weeks    Status On-going    Target Date 06/13/20      PT LONG TERM GOAL #3   Title Patient will perform STS with BUE support with Modified IND/SBA with VCs for sequencing and safety in order to participate more fully in basic ADLs.    Baseline IE: Mod/MinA, MAX VCs and TCs for sequencing/safety; 6/15: SBA, BUE // bar support, Min/Mod VCs for sequencing; 7/27: SBA, BUE // bar support, mod encouragement for participation    Time 12    Period Weeks    Status Achieved      PT LONG TERM GOAL #4   Title Patient will demonstrate improved B knee extension ROM as evidenced by lacking < 10 degrees of B knee extension PROM/AROM in order to achieve standing posture with greater independence.    Baseline IE: B lacking 15-20 degrees of knee extension; 6/15: R 0 degrees, L lacking 5 degrees    Time 12    Period Weeks    Status Achieved      PT LONG TERM GOAL #5   Title Patient will demonstrate improved static standing balance as evidenced by ability to maintain standing posture with CGA/SBA, SUE support for 4  minutes in order to participate in basic ADLs.    Baseline IE: 90 sec, MinA, BUE support; 6/15: 2.5 min, CGA, BUE support; 7/27: 4 min, SBA, BUE support    Time 6    Period Weeks    Status Partially Met    Target Date 06/13/20      Additional Long Term Goals   Additional Long Term Goals Yes      PT LONG TERM GOAL #6   Title Patient will be able to ambulate with RW ~ 25 feet, CGA and VCs for improved ability to participate in ADLs within the home.    Baseline 7/27: // bars 6 feet, SBA, max VCs/encouragement    Time 6    Period Weeks    Status New    Target Date  06/13/20                 Plan - 05/25/20 1525    Clinical Impression Statement Patient presents to clinic alert and willing to participate in therapy. Patient demonstrates deficits in cognition, posture, BLE strength, gait, balance, and function. Patient coordinating several series of sequential steps (3-6) without prompting during today's session. Patient will benefit from continued skilled therapeutic intervention to address deficits in cognition, posture, BLE strength, gait, balance, and function in order to increase function and improve overall QOL.    Personal Factors and Comorbidities Age;Education;Comorbidity 3+;Past/Current Experience;Time since onset of injury/illness/exacerbation;Fitness;Transportation;Other   Cognitive State   Comorbidities anemia, breast cancer, chronic constipation, diverticulosis, hypertension, hyperlipidemia, osteoporosis    Examination-Activity Limitations Bathing;Dressing;Transfers;Hygiene/Grooming;Bed Mobility;Squat;Lift;Bend;Locomotion Level;Stairs;Reach Overhead;Stand;Toileting;Carry    Examination-Participation Restrictions Church;Interpersonal Relationship;Personal Finances;Yard Work;Cleaning;Laundry;Community Activity;Medication Management;Shop;Driving;Meal Prep    Stability/Clinical Decision Making Evolving/Moderate complexity    Rehab Potential Fair    PT Frequency 2x / week    PT  Duration 6 weeks    PT Treatment/Interventions Moist Heat;DME Instruction;Therapeutic activities;Functional mobility training;Stair training;Gait training;Therapeutic exercise;Balance training;Neuromuscular re-education;Cognitive remediation;Orthotic Fit/Training;Patient/family education;Manual techniques;Scar mobilization;Passive range of motion;Energy conservation;Taping    PT Next Visit Plan gait in // bars    PT Home Exercise Plan hamstring stretch    Consulted and Agree with Plan of Care Patient;Family member/caregiver    Family Member Consulted Arnelle Nale, husband           Patient will benefit from skilled therapeutic intervention in order to improve the following deficits and impairments:  Abnormal gait, Decreased balance, Decreased endurance, Decreased mobility, Difficulty walking, Hypomobility, Postural dysfunction, Improper body mechanics, Increased edema, Decreased range of motion, Decreased cognition, Decreased activity tolerance, Decreased coordination, Decreased safety awareness, Decreased strength, Impaired flexibility  Visit Diagnosis: Decreased range of motion (ROM) of both knees  Muscle weakness (generalized)  Difficulty in walking, not elsewhere classified     Problem List Patient Active Problem List   Diagnosis Date Noted  . Normocytic anemia 04/26/2020  . Encounter for hospice care discussion   . MRSA bacteremia 02/17/2020  . Streptococcal bacteremia 02/17/2020  . Bacteremia 02/17/2020  . History of breast cancer 02/17/2020  . Goals of care, counseling/discussion   . Palliative care by specialist   . DNR (do not resuscitate) discussion   . Breast pain, right 01/11/2020  . Femoral neck fracture ([place]) 06/27/2019  . Femur fracture, left ([place]) 06/26/2019  . Right lower lobe pulmonary nodule 05/04/2019  . Syncope 09/27/2017  . Atypical chest pain 09/03/2017  . Essential hypertension 09/03/2017  . Short-term memory loss 09/03/2017  . Osteoporosis  04/02/2016  . Breast cancer, right [facility]) 04/06/2008   Myles Gip PT, DPT [phone]  05/25/2020, 3:32 PM  [place] [facility] [address] [place], [place], [address] Phone: [phone]   Fax:  [phone]  Name: Cynthia Walker MRN: [id number] Date of Birth: 11/25/1934

## 2020-05-30 ENCOUNTER — Ambulatory Visit: Payer: Medicare HMO | Admitting: Physical Therapy

## 2020-05-30 ENCOUNTER — Encounter: Payer: Self-pay | Admitting: Physical Therapy

## 2020-05-30 ENCOUNTER — Other Ambulatory Visit: Payer: Self-pay

## 2020-05-30 DIAGNOSIS — R262 Difficulty in walking, not elsewhere classified: Secondary | ICD-10-CM

## 2020-05-30 DIAGNOSIS — M25661 Stiffness of right knee, not elsewhere classified: Secondary | ICD-10-CM | POA: Diagnosis not present

## 2020-05-30 DIAGNOSIS — M25662 Stiffness of left knee, not elsewhere classified: Secondary | ICD-10-CM

## 2020-05-30 DIAGNOSIS — M6281 Muscle weakness (generalized): Secondary | ICD-10-CM

## 2020-05-30 NOTE — Therapy (Signed)
[facility] [address] [place], [place], [address] Phone: [phone]   Fax:  [phone]  Physical Therapy Treatment  Patient Details  Name: Cynthia Walker MRN: [id number] Date of Birth: [birth year] Referring Provider (PT): Feldpausch   Encounter Date: 05/30/2020   PT End of Session - 05/30/20 1247    Visit Number 23    Number of Visits 36    Date for PT Re-Evaluation 06/13/20    PT Start Time 1110    PT Stop Time 1155    PT Time Calculation (min) 45 min    Equipment Utilized During Treatment Gait belt    Activity Tolerance Patient tolerated treatment well;Other (comment)   Tx limited 2/2 to attention span   Behavior During Therapy [facility] for tasks assessed/performed   Highly distractable          Past Medical History:  Diagnosis Date  . Breast cancer [facility]) 2009   Right breast CA with lumpectomy, radiation and chemo tx's.  . Dementia ([place])   . Hypertension   . Personal history of chemotherapy   . Personal history of radiation therapy     Past Surgical History:  Procedure Laterality Date  . BREAST BIOPSY Right 03/15/2008   invasive mammary carcinoma  . BREAST LUMPECTOMY Right 04/14/2008   Pathology revealed a 1.1 cm grade I invasive ductal carcinoma. Margins were negative.   . COLONOSCOPY WITH PROPOFOL N/A 07/10/2015   Procedure: COLONOSCOPY WITH PROPOFOL;  Surgeon: Hulen Luster, MD;  Location: [facility] ENDOSCOPY;  Service: Gastroenterology;  Laterality: N/A;  . INTRAMEDULLARY (IM) NAIL INTERTROCHANTERIC Left 06/27/2019   Procedure: INTRAMEDULLARY (IM) NAIL INTERTROCHANTRIC;  Surgeon: Creig Hines, MD;  Location: [facility] ORS;  Service: Orthopedics;  Laterality: Left;  . TONSILLECTOMY     age of 85 years old    There were no vitals filed for this visit.   Subjective Assessment - 05/30/20 1246    Subjective Patient presents to clinic with husband, Cynthia Walker, and she is alert and oriented to self and place. No new concerns or  falls since last session.    Patient is accompained by: Family member   husband, Cynthia Walker   Pertinent History Femur fracture, left (CMS-HCC) 06/26/2019; Patient was ambulatory without AD prior to the fall that resulted in fx. Patient's spouse notes that she was capable of walking ~0.5 miles prior to the fall. Patient's spouse reports that she can at present stand for up to 10 min, but not consistently and not without assistance. While working with HHPT patient was able to ambulate with a RW for ~ 200 feet with rest breaks throughout.    Limitations Lifting;Standing;Walking;House hold activities    How long can you sit comfortably? Unlimited    How long can you stand comfortably? 5-10 min with support    How long can you walk comfortably? < 200 feet    Patient Stated Goals 2/2 to cognitive state, patient did not state speicifc goals. Spouse would like to see her ambulatory within the house.    Currently in Pain? No/denies           TREATMENT  Neuromuscular Re-education: // bars: STS, BUE support, SBA, 2x5, with min encouragment and reorientation to task Gait, MinA at L hip/pelvis with w/c follow, BUE support, with mod encouragement and VCs for sequencing, safety, and attention to task. x1 length Static standing with head turns, BUE support, with min encouragement and VCs for safety, x2 min  Therapeutic Exercise: Seated hamstring stretch,  BLE, 2 min x1 each leg for improved ability to stand  Patient educated throughout session on appropriate technique and form using multi-modal cueing, HEP, and activity modification. Patient articulated understanding and returned demonstration.  Patient Response to interventions: Patient states "See. I'm all better. I don't need to come any more."  ASSESSMENT Patient presents to clinic alert and willing to participate in therapy. Patient demonstrates deficits in cognition, posture, BLE strength, gait, balance, and function. Patient able to sequence tasks with  fewer VCs and TCs, but LLE fatiguing easily with SLS phase of gait during today's session. Patient will benefit from continued skilled therapeutic intervention to address deficits in cognition, posture, BLE strength, gait, balance, and function in order to increase function and improve overall QOL.      PT Long Term Goals - 05/02/20 1233      PT LONG TERM GOAL #1   Title Patient will be modified independent with spousal support with HEP for improved strength and balance in order to decrease fall risk and dependence at home and in the community.    Baseline IE: not demonstrated; 6/15: good cargegiver participation and understanding    Time 12    Period Weeks    Status Achieved      PT LONG TERM GOAL #2   Title Patient will demonstrate improved function as evidenced by a score of 44 on FOTO measure for full participation in activities at home and in the community.    Baseline IE: 16; 6/15: deferred 2/2 to technical issues; 7/27: 38    Time 6    Period Weeks    Status On-going    Target Date 06/13/20      PT LONG TERM GOAL #3   Title Patient will perform STS with BUE support with Modified IND/SBA with VCs for sequencing and safety in order to participate more fully in basic ADLs.    Baseline IE: Mod/MinA, MAX VCs and TCs for sequencing/safety; 6/15: SBA, BUE // bar support, Min/Mod VCs for sequencing; 7/27: SBA, BUE // bar support, mod encouragement for participation    Time 12    Period Weeks    Status Achieved      PT LONG TERM GOAL #4   Title Patient will demonstrate improved B knee extension ROM as evidenced by lacking < 10 degrees of B knee extension PROM/AROM in order to achieve standing posture with greater independence.    Baseline IE: B lacking 15-20 degrees of knee extension; 6/15: R 0 degrees, L lacking 5 degrees    Time 12    Period Weeks    Status Achieved      PT LONG TERM GOAL #5   Title Patient will demonstrate improved static standing balance as evidenced by ability  to maintain standing posture with CGA/SBA, SUE support for 4 minutes in order to participate in basic ADLs.    Baseline IE: 90 sec, MinA, BUE support; 6/15: 2.5 min, CGA, BUE support; 7/27: 4 min, SBA, BUE support    Time 6    Period Weeks    Status Partially Met    Target Date 06/13/20      Additional Long Term Goals   Additional Long Term Goals Yes      PT LONG TERM GOAL #6   Title Patient will be able to ambulate with RW ~ 25 feet, CGA and VCs for improved ability to participate in ADLs within the home.    Baseline 7/27: // bars 6 feet, SBA, max VCs/encouragement  Time 6    Period Weeks    Status New    Target Date 06/13/20                 Plan - 05/30/20 1248    Clinical Impression Statement Patient presents to clinic alert and willing to participate in therapy. Patient demonstrates deficits in cognition, posture, BLE strength, gait, balance, and function. Patient able to sequence tasks with fewer VCs and TCs, but LLE fatiguing easily with SLS phase of gait during today's session. Patient will benefit from continued skilled therapeutic intervention to address deficits in cognition, posture, BLE strength, gait, balance, and function in order to increase function and improve overall QOL.    Personal Factors and Comorbidities Age;Education;Comorbidity 3+;Past/Current Experience;Time since onset of injury/illness/exacerbation;Fitness;Transportation;Other   Cognitive State   Comorbidities anemia, breast cancer, chronic constipation, diverticulosis, hypertension, hyperlipidemia, osteoporosis    Examination-Activity Limitations Bathing;Dressing;Transfers;Hygiene/Grooming;Bed Mobility;Squat;Lift;Bend;Locomotion Level;Stairs;Reach Overhead;Stand;Toileting;Carry    Examination-Participation Restrictions Church;Interpersonal Relationship;Personal Finances;Yard Work;Cleaning;Laundry;Community Activity;Medication Management;Shop;Driving;Meal Prep    Stability/Clinical Decision Making  Evolving/Moderate complexity    Rehab Potential Fair    PT Frequency 2x / week    PT Duration 6 weeks    PT Treatment/Interventions Moist Heat;DME Instruction;Therapeutic activities;Functional mobility training;Stair training;Gait training;Therapeutic exercise;Balance training;Neuromuscular re-education;Cognitive remediation;Orthotic Fit/Training;Patient/family education;Manual techniques;Scar mobilization;Passive range of motion;Energy conservation;Taping    PT Next Visit Plan gait in // bars    PT Home Exercise Plan hamstring stretch    Consulted and Agree with Plan of Care Patient;Family member/caregiver    Family Member Consulted Doyne Ellinger, husband           Patient will benefit from skilled therapeutic intervention in order to improve the following deficits and impairments:  Abnormal gait, Decreased balance, Decreased endurance, Decreased mobility, Difficulty walking, Hypomobility, Postural dysfunction, Improper body mechanics, Increased edema, Decreased range of motion, Decreased cognition, Decreased activity tolerance, Decreased coordination, Decreased safety awareness, Decreased strength, Impaired flexibility  Visit Diagnosis: Decreased range of motion (ROM) of both knees  Muscle weakness (generalized)  Difficulty in walking, not elsewhere classified     Problem List Patient Active Problem List   Diagnosis Date Noted  . Normocytic anemia 04/26/2020  . Encounter for hospice care discussion   . MRSA bacteremia 02/17/2020  . Streptococcal bacteremia 02/17/2020  . Bacteremia 02/17/2020  . History of breast cancer 02/17/2020  . Goals of care, counseling/discussion   . Palliative care by specialist   . DNR (do not resuscitate) discussion   . Breast pain, right 01/11/2020  . Femoral neck fracture ([place]) 06/27/2019  . Femur fracture, left ([place]) 06/26/2019  . Right lower lobe pulmonary nodule 05/04/2019  . Syncope 09/27/2017  . Atypical chest pain 09/03/2017  .  Essential hypertension 09/03/2017  . Short-term memory loss 09/03/2017  . Osteoporosis 04/02/2016  . Breast cancer, right [facility]) 04/06/2008   Myles Gip PT, DPT [phone]  05/30/2020, 12:52 PM  [place] [facility] [address] [place], [place], [address] Phone: [phone]   Fax:  [phone]  Name: KYNZLIE HILLEARY MRN: [id number] Date of Birth: 07-16-1935

## 2020-06-01 ENCOUNTER — Other Ambulatory Visit: Payer: Self-pay

## 2020-06-01 ENCOUNTER — Ambulatory Visit: Payer: Medicare HMO | Admitting: Physical Therapy

## 2020-06-01 ENCOUNTER — Encounter: Payer: Self-pay | Admitting: Physical Therapy

## 2020-06-01 DIAGNOSIS — M25661 Stiffness of right knee, not elsewhere classified: Secondary | ICD-10-CM

## 2020-06-01 DIAGNOSIS — R262 Difficulty in walking, not elsewhere classified: Secondary | ICD-10-CM

## 2020-06-01 DIAGNOSIS — M6281 Muscle weakness (generalized): Secondary | ICD-10-CM

## 2020-06-01 NOTE — Therapy (Signed)
[place] [facility] [address] [place], [place], [address] Phone: [phone]   Fax:  [phone]  Physical Therapy Treatment  Patient Details  Name: Cynthia Walker MRN: [id number] Date of Birth: November 18, 1934 Referring Provider (PT): Feldpausch   Encounter Date: 06/01/2020   PT End of Session - 06/01/20 1323    Visit Number 24    Number of Visits 36    Date for PT Re-Evaluation 06/13/20    PT Start Time 1110    PT Stop Time 1155    PT Time Calculation (min) 45 min    Equipment Utilized During Treatment Gait belt    Activity Tolerance Patient tolerated treatment well;Other (comment);Patient limited by pain   Tx limited 2/2 to attention span   Behavior During Therapy [facility] for tasks assessed/performed   Highly distractable          Past Medical History:  Diagnosis Date  . Breast cancer [facility]) 2009   Right breast CA with lumpectomy, radiation and chemo tx's.  . Dementia ([place])   . Hypertension   . Personal history of chemotherapy   . Personal history of radiation therapy     Past Surgical History:  Procedure Laterality Date  . BREAST BIOPSY Right 03/15/2008   invasive mammary carcinoma  . BREAST LUMPECTOMY Right 04/14/2008   Pathology revealed a 1.1 cm grade I invasive ductal carcinoma. Margins were negative.   . COLONOSCOPY WITH PROPOFOL N/A 07/10/2015   Procedure: COLONOSCOPY WITH PROPOFOL;  Surgeon: Hulen Luster, MD;  Location: [facility] ENDOSCOPY;  Service: Gastroenterology;  Laterality: N/A;  . INTRAMEDULLARY (IM) NAIL INTERTROCHANTERIC Left 06/27/2019   Procedure: INTRAMEDULLARY (IM) NAIL INTERTROCHANTRIC;  Surgeon: Creig Hines, MD;  Location: [facility] ORS;  Service: Orthopedics;  Laterality: Left;  . TONSILLECTOMY     age of 85 years old    There were no vitals filed for this visit.   Subjective Assessment - 06/01/20 1319    Subjective Patient rather somnolent on arrival to clinic and states that she does not feel like walking  or doing anything today. Patient with some grimmacing and brow furrowing when w/c crosses threshold with bump. Spouse, [name], notes patient ate full breakfast and had a restful night of sleep. He does note that patient awoke with pain on Tuesday night (?) in her R leg, but was able to get back to sleep.    Patient is accompained by: Family member   husband, [name]   Pertinent History Femur fracture, left (CMS-HCC) 06/26/2019; Patient was ambulatory without AD prior to the fall that resulted in fx. Patient's spouse notes that she was capable of walking ~0.5 miles prior to the fall. Patient's spouse reports that she can at present stand for up to 10 min, but not consistently and not without assistance. While working with HHPT patient was able to ambulate with a RW for ~ 200 feet with rest breaks throughout.    Limitations Lifting;Standing;Walking;House hold activities    How long can you sit comfortably? Unlimited    How long can you stand comfortably? 5-10 min with support    How long can you walk comfortably? < 200 feet    Patient Stated Goals 2/2 to cognitive state, patient did not state speicifc goals. Spouse would like to see her ambulatory within the house.    Currently in Pain? Other (Comment)   grimmacing and tearful at points              TREATMENT  Neuromuscular Re-education: //  bars: STS, BUE support, SBA, x1, with min encouragment and reorientation to task Static standing, BUE support, with min encouragement and VCs for safety, x45 sec. Patient becoming tearful and grimacing with difficulty bearing weight on LLE. Seated postural control, BUE support, anterior trunk lean away from backrest, 2x10  Therapeutic Exercise: Seated hamstring stretch, BLE, 2 min x1 each leg for improved ability to stand  Patient educated throughout session on appropriate technique and form using multi-modal cueing, HEP, and activity modification. Patient articulated understanding and returned  demonstration.  Patient Response to interventions: Patient resting with eyes closed.  ASSESSMENT Patient presents to clinic alert and willing to participate in therapy. Patient demonstrates deficits in cognition, posture, BLE strength, gait, balance, and function. Patient TTP over L glute medius and unable to perform standing balance/dynamic balance tasks during today's session, but willing and able to perform seated trunk strengthening. Patient will benefit from continued skilled therapeutic intervention to address deficits in cognition, posture, BLE strength, gait, balance, and function in order to increase function and improve overall QOL.      PT Long Term Goals - 05/02/20 1233      PT LONG TERM GOAL #1   Title Patient will be modified independent with spousal support with HEP for improved strength and balance in order to decrease fall risk and dependence at home and in the community.    Baseline IE: not demonstrated; 6/15: good cargegiver participation and understanding    Time 12    Period Weeks    Status Achieved      PT LONG TERM GOAL #2   Title Patient will demonstrate improved function as evidenced by a score of 44 on FOTO measure for full participation in activities at home and in the community.    Baseline IE: 16; 6/15: deferred 2/2 to technical issues; 7/27: 38    Time 6    Period Weeks    Status On-going    Target Date 06/13/20      PT LONG TERM GOAL #3   Title Patient will perform STS with BUE support with Modified IND/SBA with VCs for sequencing and safety in order to participate more fully in basic ADLs.    Baseline IE: Mod/MinA, MAX VCs and TCs for sequencing/safety; 6/15: SBA, BUE // bar support, Min/Mod VCs for sequencing; 7/27: SBA, BUE // bar support, mod encouragement for participation    Time 12    Period Weeks    Status Achieved      PT LONG TERM GOAL #4   Title Patient will demonstrate improved B knee extension ROM as evidenced by lacking < 10 degrees of  B knee extension PROM/AROM in order to achieve standing posture with greater independence.    Baseline IE: B lacking 15-20 degrees of knee extension; 6/15: R 0 degrees, L lacking 5 degrees    Time 12    Period Weeks    Status Achieved      PT LONG TERM GOAL #5   Title Patient will demonstrate improved static standing balance as evidenced by ability to maintain standing posture with CGA/SBA, SUE support for 4 minutes in order to participate in basic ADLs.    Baseline IE: 90 sec, MinA, BUE support; 6/15: 2.5 min, CGA, BUE support; 7/27: 4 min, SBA, BUE support    Time 6    Period Weeks    Status Partially Met    Target Date 06/13/20      Additional Long Term Goals   Additional Long Term Goals  Yes      PT LONG TERM GOAL #6   Title Patient will be able to ambulate with RW ~ 25 feet, CGA and VCs for improved ability to participate in ADLs within the home.    Baseline 7/27: // bars 6 feet, SBA, max VCs/encouragement    Time 6    Period Weeks    Status New    Target Date 06/13/20                 Plan - 06/01/20 1324    Clinical Impression Statement Patient presents to clinic alert and willing to participate in therapy. Patient demonstrates deficits in cognition, posture, BLE strength, gait, balance, and function. Patient TTP over L glute medius and unable to perform standing balance/dynamic balance tasks during today's session, but willing and able to perform seated trunk strengthening. Patient will benefit from continued skilled therapeutic intervention to address deficits in cognition, posture, BLE strength, gait, balance, and function in order to increase function and improve overall QOL.    Personal Factors and Comorbidities Age;Education;Comorbidity 3+;Past/Current Experience;Time since onset of injury/illness/exacerbation;Fitness;Transportation;Other   Cognitive State   Comorbidities anemia, breast cancer, chronic constipation, diverticulosis, hypertension, hyperlipidemia,  osteoporosis    Examination-Activity Limitations Bathing;Dressing;Transfers;Hygiene/Grooming;Bed Mobility;Squat;Lift;Bend;Locomotion Level;Stairs;Reach Overhead;Stand;Toileting;Carry    Examination-Participation Restrictions Church;Interpersonal Relationship;Personal Finances;Yard Work;Cleaning;Laundry;Community Activity;Medication Management;Shop;Driving;Meal Prep    Stability/Clinical Decision Making Evolving/Moderate complexity    Rehab Potential Fair    PT Frequency 2x / week    PT Duration 6 weeks    PT Treatment/Interventions Moist Heat;DME Instruction;Therapeutic activities;Functional mobility training;Stair training;Gait training;Therapeutic exercise;Balance training;Neuromuscular re-education;Cognitive remediation;Orthotic Fit/Training;Patient/family education;Manual techniques;Scar mobilization;Passive range of motion;Energy conservation;Taping    PT Next Visit Plan gait in // bars    PT Home Exercise Plan hamstring stretch    Consulted and Agree with Plan of Care Patient;Family member/caregiver    Family Member Consulted Jesseca Marsch, husband           Patient will benefit from skilled therapeutic intervention in order to improve the following deficits and impairments:  Abnormal gait, Decreased balance, Decreased endurance, Decreased mobility, Difficulty walking, Hypomobility, Postural dysfunction, Improper body mechanics, Increased edema, Decreased range of motion, Decreased cognition, Decreased activity tolerance, Decreased coordination, Decreased safety awareness, Decreased strength, Impaired flexibility  Visit Diagnosis: Decreased range of motion (ROM) of both knees  Muscle weakness (generalized)  Difficulty in walking, not elsewhere classified     Problem List Patient Active Problem List   Diagnosis Date Noted  . Normocytic anemia 04/26/2020  . Encounter for hospice care discussion   . MRSA bacteremia 02/17/2020  . Streptococcal bacteremia 02/17/2020  .  Bacteremia 02/17/2020  . History of breast cancer 02/17/2020  . Goals of care, counseling/discussion   . Palliative care by specialist   . DNR (do not resuscitate) discussion   . Breast pain, right 01/11/2020  . Femoral neck fracture ([place]) 06/27/2019  . Femur fracture, left ([place]) 06/26/2019  . Right lower lobe pulmonary nodule 05/04/2019  . Syncope 09/27/2017  . Atypical chest pain 09/03/2017  . Essential hypertension 09/03/2017  . Short-term memory loss 09/03/2017  . Osteoporosis 04/02/2016  . Breast cancer, right [facility]) 04/06/2008   Myles Gip PT, DPT [phone]  06/01/2020, 1:31 PM  [place] [facility] [address] [place], [place], [address] Phone: [phone]   Fax:  [phone]  Name: DANNIKA HILGEMAN MRN: [id number] Date of Birth: 05-Apr-1935

## 2020-06-06 ENCOUNTER — Encounter: Payer: Self-pay | Admitting: Physical Therapy

## 2020-06-06 ENCOUNTER — Other Ambulatory Visit: Payer: Self-pay

## 2020-06-06 ENCOUNTER — Ambulatory Visit: Payer: Medicare HMO | Admitting: Physical Therapy

## 2020-06-06 DIAGNOSIS — M25661 Stiffness of right knee, not elsewhere classified: Secondary | ICD-10-CM | POA: Diagnosis not present

## 2020-06-06 DIAGNOSIS — M6281 Muscle weakness (generalized): Secondary | ICD-10-CM

## 2020-06-06 DIAGNOSIS — R262 Difficulty in walking, not elsewhere classified: Secondary | ICD-10-CM

## 2020-06-06 NOTE — Therapy (Signed)
[facility] [address] [place], [place], [address] Phone: [phone]   Fax:  [phone]  Physical Therapy Treatment  Patient Details  Name: Cynthia Walker MRN: [id number] Date of Birth: [birth year] Referring Provider (PT): Feldpausch   Encounter Date: 06/06/2020   PT End of Session - 06/06/20 1322    Visit Number 25    Number of Visits 36    Date for PT Re-Evaluation 06/13/20    PT Start Time 1102    PT Stop Time 1158    PT Time Calculation (min) 56 min    Equipment Utilized During Treatment Gait belt    Activity Tolerance Patient tolerated treatment well;Other (comment)   Tx limited 2/2 to attention span   Behavior During Therapy [facility] for tasks assessed/performed   Highly distractable          Past Medical History:  Diagnosis Date  . Breast cancer [facility]) 2009   Right breast CA with lumpectomy, radiation and chemo tx's.  . Dementia ([place])   . Hypertension   . Personal history of chemotherapy   . Personal history of radiation therapy     Past Surgical History:  Procedure Laterality Date  . BREAST BIOPSY Right 03/15/2008   invasive mammary carcinoma  . BREAST LUMPECTOMY Right 04/14/2008   Pathology revealed a 1.1 cm grade I invasive ductal carcinoma. Margins were negative.   . COLONOSCOPY WITH PROPOFOL N/A 07/10/2015   Procedure: COLONOSCOPY WITH PROPOFOL;  Surgeon: Hulen Luster, MD;  Location: [facility] ENDOSCOPY;  Service: Gastroenterology;  Laterality: N/A;  . INTRAMEDULLARY (IM) NAIL INTERTROCHANTERIC Left 06/27/2019   Procedure: INTRAMEDULLARY (IM) NAIL INTERTROCHANTRIC;  Surgeon: Creig Hines, MD;  Location: [facility] ORS;  Service: Orthopedics;  Laterality: Left;  . TONSILLECTOMY     age of 85 years old    There were no vitals filed for this visit.   Subjective Assessment - 06/06/20 1318    Subjective Patient alert and amenable to therapy on arrival. Patient's spouse reports that patient was able to stand holding on  to grab bar for ~30 min this morning while he bathed and dressed her. She had some back discomfort come on at about 20 minutes as well as muscle fatigue, but she was able to remain standing. She notes some increased soreness in her shoulders L>R. Spouse also notes that they have stopped having in-home caregivers out to the house due to financial restrictions.    Patient is accompained by: Family member   husband, Cynthia Walker   Pertinent History Femur fracture, left (CMS-HCC) 06/26/2019; Patient was ambulatory without AD prior to the fall that resulted in fx. Patient's spouse notes that she was capable of walking ~0.5 miles prior to the fall. Patient's spouse reports that she can at present stand for up to 10 min, but not consistently and not without assistance. While working with HHPT patient was able to ambulate with a RW for ~ 200 feet with rest breaks throughout.    Limitations Lifting;Standing;Walking;House hold activities    How long can you sit comfortably? Unlimited    How long can you stand comfortably? 5-10 min with support    How long can you walk comfortably? < 200 feet    Patient Stated Goals 2/2 to cognitive state, patient did not state speicifc goals. Spouse would like to see her ambulatory within the house.    Currently in Pain? Other (Comment)   grimmacing with LUE forward reach  TREATMENT  Neuromuscular Re-education: Outside clinic with RW, STS x5 with attempt to perform gait on level surface. PT bracing RW to prevent LOB as patient unable to push up from sitting position. Patient able to maintain standing with CGA and continued RW bracing by PT.  // bars: STS, BUE support, SBA, x5, with min encouragment and reorientation to task Gait x6 feet, BUE support, CGA with intermittent min A at left hip for stabilization during SLS. W/C follow.  Therapeutic Exercise: Seated strengthening:   Hip flexion march x10, BLE  LAQ, x10 BLE (Increased VCs and encouragement for attention to  task and completion of task.)  Patient educated throughout session on appropriate technique and form using multi-modal cueing, HEP, and activity modification. Patient articulated understanding and returned demonstration.  Patient Response to interventions: Patient states she is ready to leave.  ASSESSMENT Patient presents to clinic alert and willing to participate in therapy. Patient demonstrates deficits in cognition, posture, BLE strength, gait, balance, and function. Patient able to coordinate seated strengthening activities as well as STS with RW (PT bracing RW for safety) during today's session and responded well to active interventions. Patient will benefit from continued skilled therapeutic intervention to address deficits in cognition, posture, BLE strength, gait, balance, and function in order to increase function and improve overall QOL.      PT Long Term Goals - 05/02/20 1233      PT LONG TERM GOAL #1   Title Patient will be modified independent with spousal support with HEP for improved strength and balance in order to decrease fall risk and dependence at home and in the community.    Baseline IE: not demonstrated; 6/15: good cargegiver participation and understanding    Time 12    Period Weeks    Status Achieved      PT LONG TERM GOAL #2   Title Patient will demonstrate improved function as evidenced by a score of 44 on FOTO measure for full participation in activities at home and in the community.    Baseline IE: 16; 6/15: deferred 2/2 to technical issues; 7/27: 38    Time 6    Period Weeks    Status On-going    Target Date 06/13/20      PT LONG TERM GOAL #3   Title Patient will perform STS with BUE support with Modified IND/SBA with VCs for sequencing and safety in order to participate more fully in basic ADLs.    Baseline IE: Mod/MinA, MAX VCs and TCs for sequencing/safety; 6/15: SBA, BUE // bar support, Min/Mod VCs for sequencing; 7/27: SBA, BUE // bar support, mod  encouragement for participation    Time 12    Period Weeks    Status Achieved      PT LONG TERM GOAL #4   Title Patient will demonstrate improved B knee extension ROM as evidenced by lacking < 10 degrees of B knee extension PROM/AROM in order to achieve standing posture with greater independence.    Baseline IE: B lacking 15-20 degrees of knee extension; 6/15: R 0 degrees, L lacking 5 degrees    Time 12    Period Weeks    Status Achieved      PT LONG TERM GOAL #5   Title Patient will demonstrate improved static standing balance as evidenced by ability to maintain standing posture with CGA/SBA, SUE support for 4 minutes in order to participate in basic ADLs.    Baseline IE: 90 sec, MinA, BUE support; 6/15: 2.5 min, CGA,  BUE support; 7/27: 4 min, SBA, BUE support    Time 6    Period Weeks    Status Partially Met    Target Date 06/13/20      Additional Long Term Goals   Additional Long Term Goals Yes      PT LONG TERM GOAL #6   Title Patient will be able to ambulate with RW ~ 25 feet, CGA and VCs for improved ability to participate in ADLs within the home.    Baseline 7/27: // bars 6 feet, SBA, max VCs/encouragement    Time 6    Period Weeks    Status New    Target Date 06/13/20                 Plan - 06/06/20 1323    Clinical Impression Statement Patient presents to clinic alert and willing to participate in therapy. Patient demonstrates deficits in cognition, posture, BLE strength, gait, balance, and function. Patient able to coordinate seated strengthening activities as well as STS with RW (PT bracing RW for safety) during today's session and responded well to active interventions. Patient will benefit from continued skilled therapeutic intervention to address deficits in cognition, posture, BLE strength, gait, balance, and function in order to increase function and improve overall QOL.    Personal Factors and Comorbidities Age;Education;Comorbidity 3+;Past/Current  Experience;Time since onset of injury/illness/exacerbation;Fitness;Transportation;Other   Cognitive State   Comorbidities anemia, breast cancer, chronic constipation, diverticulosis, hypertension, hyperlipidemia, osteoporosis    Examination-Activity Limitations Bathing;Dressing;Transfers;Hygiene/Grooming;Bed Mobility;Squat;Lift;Bend;Locomotion Level;Stairs;Reach Overhead;Stand;Toileting;Carry    Examination-Participation Restrictions Church;Interpersonal Relationship;Personal Finances;Yard Work;Cleaning;Laundry;Community Activity;Medication Management;Shop;Driving;Meal Prep    Stability/Clinical Decision Making Evolving/Moderate complexity    Rehab Potential Fair    PT Frequency 2x / week    PT Duration 6 weeks    PT Treatment/Interventions Moist Heat;DME Instruction;Therapeutic activities;Functional mobility training;Stair training;Gait training;Therapeutic exercise;Balance training;Neuromuscular re-education;Cognitive remediation;Orthotic Fit/Training;Patient/family education;Manual techniques;Scar mobilization;Passive range of motion;Energy conservation;Taping    PT Next Visit Plan gait in // bars    PT Home Exercise Plan hamstring stretch    Consulted and Agree with Plan of Care Patient;Family member/caregiver    Family Member Consulted Ariaunna Longsworth, husband           Patient will benefit from skilled therapeutic intervention in order to improve the following deficits and impairments:  Abnormal gait, Decreased balance, Decreased endurance, Decreased mobility, Difficulty walking, Hypomobility, Postural dysfunction, Improper body mechanics, Increased edema, Decreased range of motion, Decreased cognition, Decreased activity tolerance, Decreased coordination, Decreased safety awareness, Decreased strength, Impaired flexibility  Visit Diagnosis: Decreased range of motion (ROM) of both knees  Muscle weakness (generalized)  Difficulty in walking, not elsewhere classified     Problem  List Patient Active Problem List   Diagnosis Date Noted  . Normocytic anemia 04/26/2020  . Encounter for hospice care discussion   . MRSA bacteremia 02/17/2020  . Streptococcal bacteremia 02/17/2020  . Bacteremia 02/17/2020  . History of breast cancer 02/17/2020  . Goals of care, counseling/discussion   . Palliative care by specialist   . DNR (do not resuscitate) discussion   . Breast pain, right 01/11/2020  . Femoral neck fracture ([place]) 06/27/2019  . Femur fracture, left ([place]) 06/26/2019  . Right lower lobe pulmonary nodule 05/04/2019  . Syncope 09/27/2017  . Atypical chest pain 09/03/2017  . Essential hypertension 09/03/2017  . Short-term memory loss 09/03/2017  . Osteoporosis 04/02/2016  . Breast cancer, right [facility]) 04/06/2008   Myles Gip PT, DPT [phone]  06/06/2020, 1:40 PM  [facility] [address]. [place], [place], [address] Phone: [phone]   Fax:  [phone]  Name: YITTEL EMRICH MRN: [id number] Date of Birth: January 21, 1935

## 2020-06-08 ENCOUNTER — Ambulatory Visit: Payer: Medicare HMO | Attending: Family Medicine | Admitting: Physical Therapy

## 2020-06-08 ENCOUNTER — Other Ambulatory Visit: Payer: Self-pay

## 2020-06-08 ENCOUNTER — Encounter: Payer: Self-pay | Admitting: Physical Therapy

## 2020-06-08 DIAGNOSIS — M25661 Stiffness of right knee, not elsewhere classified: Secondary | ICD-10-CM | POA: Diagnosis present

## 2020-06-08 DIAGNOSIS — M6281 Muscle weakness (generalized): Secondary | ICD-10-CM | POA: Insufficient documentation

## 2020-06-08 DIAGNOSIS — R262 Difficulty in walking, not elsewhere classified: Secondary | ICD-10-CM | POA: Insufficient documentation

## 2020-06-08 DIAGNOSIS — M25662 Stiffness of left knee, not elsewhere classified: Secondary | ICD-10-CM | POA: Diagnosis present

## 2020-06-08 NOTE — Therapy (Signed)
[facility] [address] [place], [place], [address] Phone: [phone]   Fax:  [phone]  Physical Therapy Treatment  Patient Details  Name: Cynthia Walker MRN: [id number] Date of Birth: [birth year] Referring Provider (PT): Feldpausch   Encounter Date: 06/08/2020   PT End of Session - 06/08/20 1232    Visit Number 26    Number of Visits 36    Date for PT Re-Evaluation 06/13/20    PT Start Time 8299    PT Stop Time 1200    PT Time Calculation (min) 55 min    Equipment Utilized During Treatment Gait belt    Activity Tolerance Patient tolerated treatment well;Other (comment)   Tx limited 2/2 to attention span   Behavior During Therapy [facility] for tasks assessed/performed   Highly distractable          Past Medical History:  Diagnosis Date  . Breast cancer [facility]) 2009   Right breast CA with lumpectomy, radiation and chemo tx's.  . Dementia ([place])   . Hypertension   . Personal history of chemotherapy   . Personal history of radiation therapy     Past Surgical History:  Procedure Laterality Date  . BREAST BIOPSY Right 03/15/2008   invasive mammary carcinoma  . BREAST LUMPECTOMY Right 04/14/2008   Pathology revealed a 1.1 cm grade I invasive ductal carcinoma. Margins were negative.   . COLONOSCOPY WITH PROPOFOL N/A 07/10/2015   Procedure: COLONOSCOPY WITH PROPOFOL;  Surgeon: Hulen Luster, MD;  Location: [facility] ENDOSCOPY;  Service: Gastroenterology;  Laterality: N/A;  . INTRAMEDULLARY (IM) NAIL INTERTROCHANTERIC Left 06/27/2019   Procedure: INTRAMEDULLARY (IM) NAIL INTERTROCHANTRIC;  Surgeon: Creig Hines, MD;  Location: [facility] ORS;  Service: Orthopedics;  Laterality: Left;  . TONSILLECTOMY     age of 85 years old    There were no vitals filed for this visit.   Subjective Assessment - 06/08/20 1231    Subjective Patient alert and oriented today on arrival. Patient slightly resistant to participating in therapy and articulating  a fear of falling, but ultimately willing to participate. Spouse notes that last night patient was able to perform a pivot transfer from the couch to w/c in order to go to bed and did so without incident or unsteadiness.    Patient is accompained by: Family member   husband, [name]   Pertinent History Femur fracture, left (CMS-HCC) 06/26/2019; Patient was ambulatory without AD prior to the fall that resulted in fx. Patient's spouse notes that she was capable of walking ~0.5 miles prior to the fall. Patient's spouse reports that she can at present stand for up to 10 min, but not consistently and not without assistance. While working with HHPT patient was able to ambulate with a RW for ~ 200 feet with rest breaks throughout.    Limitations Lifting;Standing;Walking;House hold activities    How long can you sit comfortably? Unlimited    How long can you stand comfortably? 5-10 min with support    How long can you walk comfortably? < 200 feet    Patient Stated Goals 2/2 to cognitive state, patient did not state speicifc goals. Spouse would like to see her ambulatory within the house.    Currently in Pain? No/denies           TREATMENT  Neuromuscular Re-education: STS with RW from w/c, max VCs for sequencing and safety. Patient leaning posteriorly with greater frequency today but able to correct with VC and TC. PT bracing RW  for safety throughout. CGA Static standing balance, RW, CGA, x2 min, 3x1 min. Max VCs for safe DME use and posture. Faded PT bracing of RW.     Patient educated throughout session on appropriate technique and form using multi-modal cueing, HEP, and activity modification. Patient articulated understanding and returned demonstration.  Patient Response to interventions: Patient asking to leave.  ASSESSMENT Patient presents to clinic alert and willing to participate in therapy. Patient demonstrates deficits in cognition, posture, BLE strength, gait, balance, and function. Patient  able to static stand with RW, BUE support, CGA, and no PT bracing of RW for 1 min during today's session and responded well to active interventions. Patient will benefit from continued skilled therapeutic intervention to address deficits in cognition, posture, BLE strength, gait, balance, and function in order to increase function and improve overall QOL.      PT Long Term Goals - 05/02/20 1233      PT LONG TERM GOAL #1   Title Patient will be modified independent with spousal support with HEP for improved strength and balance in order to decrease fall risk and dependence at home and in the community.    Baseline IE: not demonstrated; 6/15: good cargegiver participation and understanding    Time 12    Period Weeks    Status Achieved      PT LONG TERM GOAL #2   Title Patient will demonstrate improved function as evidenced by a score of 44 on FOTO measure for full participation in activities at home and in the community.    Baseline IE: 16; 6/15: deferred 2/2 to technical issues; 7/27: 38    Time 6    Period Weeks    Status On-going    Target Date 06/13/20      PT LONG TERM GOAL #3   Title Patient will perform STS with BUE support with Modified IND/SBA with VCs for sequencing and safety in order to participate more fully in basic ADLs.    Baseline IE: Mod/MinA, MAX VCs and TCs for sequencing/safety; 6/15: SBA, BUE // bar support, Min/Mod VCs for sequencing; 7/27: SBA, BUE // bar support, mod encouragement for participation    Time 12    Period Weeks    Status Achieved      PT LONG TERM GOAL #4   Title Patient will demonstrate improved B knee extension ROM as evidenced by lacking < 10 degrees of B knee extension PROM/AROM in order to achieve standing posture with greater independence.    Baseline IE: B lacking 15-20 degrees of knee extension; 6/15: R 0 degrees, L lacking 5 degrees    Time 12    Period Weeks    Status Achieved      PT LONG TERM GOAL #5   Title Patient will  demonstrate improved static standing balance as evidenced by ability to maintain standing posture with CGA/SBA, SUE support for 4 minutes in order to participate in basic ADLs.    Baseline IE: 90 sec, MinA, BUE support; 6/15: 2.5 min, CGA, BUE support; 7/27: 4 min, SBA, BUE support    Time 6    Period Weeks    Status Partially Met    Target Date 06/13/20      Additional Long Term Goals   Additional Long Term Goals Yes      PT LONG TERM GOAL #6   Title Patient will be able to ambulate with RW ~ 25 feet, CGA and VCs for improved ability to participate in ADLs within  the home.    Baseline 7/27: // bars 6 feet, SBA, max VCs/encouragement    Time 6    Period Weeks    Status New    Target Date 06/13/20                 Plan - 06/08/20 1233    Clinical Impression Statement Patient presents to clinic alert and willing to participate in therapy. Patient demonstrates deficits in cognition, posture, BLE strength, gait, balance, and function. Patient able to static stand with RW, BUE support, CGA, and no PT bracing of RW for 1 min during today's session and responded well to active interventions. Patient will benefit from continued skilled therapeutic intervention to address deficits in cognition, posture, BLE strength, gait, balance, and function in order to increase function and improve overall QOL.    Personal Factors and Comorbidities Age;Education;Comorbidity 3+;Past/Current Experience;Time since onset of injury/illness/exacerbation;Fitness;Transportation;Other   Cognitive State   Comorbidities anemia, breast cancer, chronic constipation, diverticulosis, hypertension, hyperlipidemia, osteoporosis    Examination-Activity Limitations Bathing;Dressing;Transfers;Hygiene/Grooming;Bed Mobility;Squat;Lift;Bend;Locomotion Level;Stairs;Reach Overhead;Stand;Toileting;Carry    Examination-Participation Restrictions Church;Interpersonal Relationship;Personal Finances;Yard Work;Cleaning;Laundry;Community  Activity;Medication Management;Shop;Driving;Meal Prep    Stability/Clinical Decision Making Evolving/Moderate complexity    Rehab Potential Fair    PT Frequency 2x / week    PT Duration 6 weeks    PT Treatment/Interventions Moist Heat;DME Instruction;Therapeutic activities;Functional mobility training;Stair training;Gait training;Therapeutic exercise;Balance training;Neuromuscular re-education;Cognitive remediation;Orthotic Fit/Training;Patient/family education;Manual techniques;Scar mobilization;Passive range of motion;Energy conservation;Taping    PT Next Visit Plan RW gait in gym    PT Home Exercise Plan hamstring stretch    Consulted and Agree with Plan of Care Patient;Family member/caregiver    Family Member Consulted Wendy Hoback, husband           Patient will benefit from skilled therapeutic intervention in order to improve the following deficits and impairments:  Abnormal gait, Decreased balance, Decreased endurance, Decreased mobility, Difficulty walking, Hypomobility, Postural dysfunction, Improper body mechanics, Increased edema, Decreased range of motion, Decreased cognition, Decreased activity tolerance, Decreased coordination, Decreased safety awareness, Decreased strength, Impaired flexibility  Visit Diagnosis: Decreased range of motion (ROM) of both knees  Muscle weakness (generalized)  Difficulty in walking, not elsewhere classified     Problem List Patient Active Problem List   Diagnosis Date Noted  . Normocytic anemia 04/26/2020  . Encounter for hospice care discussion   . MRSA bacteremia 02/17/2020  . Streptococcal bacteremia 02/17/2020  . Bacteremia 02/17/2020  . History of breast cancer 02/17/2020  . Goals of care, counseling/discussion   . Palliative care by specialist   . DNR (do not resuscitate) discussion   . Breast pain, right 01/11/2020  . Femoral neck fracture ([place]) 06/27/2019  . Femur fracture, left ([place]) 06/26/2019  . Right lower lobe  pulmonary nodule 05/04/2019  . Syncope 09/27/2017  . Atypical chest pain 09/03/2017  . Essential hypertension 09/03/2017  . Short-term memory loss 09/03/2017  . Osteoporosis 04/02/2016  . Breast cancer, right [facility]) 04/06/2008   Myles Gip PT, DPT [phone]  06/08/2020, 12:40 PM  [facility] [address] [place], [place], [address] Phone: [phone]   Fax:  [phone]  Name: FELISE GEORGIA MRN: [id number] Date of Birth: [birth year]

## 2020-06-13 ENCOUNTER — Encounter: Payer: Self-pay | Admitting: Physical Therapy

## 2020-06-13 ENCOUNTER — Other Ambulatory Visit: Payer: Self-pay

## 2020-06-13 ENCOUNTER — Ambulatory Visit: Payer: Medicare HMO | Admitting: Physical Therapy

## 2020-06-13 DIAGNOSIS — M25661 Stiffness of right knee, not elsewhere classified: Secondary | ICD-10-CM | POA: Diagnosis not present

## 2020-06-13 DIAGNOSIS — R262 Difficulty in walking, not elsewhere classified: Secondary | ICD-10-CM

## 2020-06-13 DIAGNOSIS — M6281 Muscle weakness (generalized): Secondary | ICD-10-CM

## 2020-06-13 NOTE — Therapy (Signed)
[facility] [address] [place], [place], [address] Phone: [phone]   Fax:  [phone]  Physical Therapy Treatment  Patient Details  Name: Cynthia Walker MRN: [id number] Date of Birth: [birth year] Referring Provider (PT): Feldpausch   Encounter Date: 06/13/2020   PT End of Session - 06/13/20 1628    Visit Number 27    Number of Visits 36    Date for PT Re-Evaluation 06/13/20    PT Start Time 1055    PT Stop Time 1150    PT Time Calculation (min) 55 min    Equipment Utilized During Treatment Gait belt    Activity Tolerance Patient tolerated treatment well;Other (comment)   Tx limited 2/2 to attention span   Behavior During Therapy [facility] for tasks assessed/performed   Highly distractable          Past Medical History:  Diagnosis Date  . Breast cancer [facility]) 2009   Right breast CA with lumpectomy, radiation and chemo tx's.  . Dementia ([place])   . Hypertension   . Personal history of chemotherapy   . Personal history of radiation therapy     Past Surgical History:  Procedure Laterality Date  . BREAST BIOPSY Right 03/15/2008   invasive mammary carcinoma  . BREAST LUMPECTOMY Right 04/14/2008   Pathology revealed a 1.1 cm grade I invasive ductal carcinoma. Margins were negative.   . COLONOSCOPY WITH PROPOFOL N/A 07/10/2015   Procedure: COLONOSCOPY WITH PROPOFOL;  Surgeon: Hulen Luster, MD;  Location: [facility] ENDOSCOPY;  Service: Gastroenterology;  Laterality: N/A;  . INTRAMEDULLARY (IM) NAIL INTERTROCHANTERIC Left 06/27/2019   Procedure: INTRAMEDULLARY (IM) NAIL INTERTROCHANTRIC;  Surgeon: Creig Hines, MD;  Location: [facility] ORS;  Service: Orthopedics;  Laterality: Left;  . TONSILLECTOMY     age of 85 years old    There were no vitals filed for this visit.   Subjective Assessment - 06/13/20 1626    Subjective Patient alert and oriented on arrival to clinic. Patient's spouse notes that she continues to stand for 20-30 min  with grab bar and gait belt support during ADLs without incident and with improved tolerance. Patient notes she would like go home on entry to PT gym.    Patient is accompained by: Family member   husband, Cynthia Walker   Pertinent History Femur fracture, left (CMS-HCC) 06/26/2019; Patient was ambulatory without AD prior to the fall that resulted in fx. Patient's spouse notes that she was capable of walking ~0.5 miles prior to the fall. Patient's spouse reports that she can at present stand for up to 10 min, but not consistently and not without assistance. While working with HHPT patient was able to ambulate with a RW for ~ 200 feet with rest breaks throughout.    Limitations Lifting;Standing;Walking;House hold activities    How long can you sit comfortably? Unlimited    How long can you stand comfortably? 5-10 min with support    How long can you walk comfortably? < 200 feet    Patient Stated Goals 2/2 to cognitive state, patient did not state speicifc goals. Spouse would like to see her ambulatory within the house.    Currently in Pain? No/denies           TREATMENT  Neuromuscular Re-education: STS with RW from w/c, 3x5, max VCs for sequencing and safety. Patient leaning posteriorly with greater frequency today but able to correct with VC and TC. PT bracing RW for safety throughout. CGA Static standing balance, RW, CGA,  x2 min, 3x1 min. Max VCs for safe DME use and posture. Faded PT bracing of RW.  Standing mini marches, RW, MinA, 3x10 Seated LAQ and marches for improved coordination and proprioception   Patient educated throughout session on appropriate technique and form using multi-modal cueing, HEP, and activity modification. Patient articulated understanding and returned demonstration.  Patient Response to interventions: Patient asking to leave.  ASSESSMENT Patient presents to clinic alert and willing to participate in therapy. Patient demonstrates deficits in cognition, posture, BLE  strength, gait, balance, and function. Patient able to perform 3 sets of 5 reps for STS with RW (PT bracing RW) and able to static stand with RW, BUE support, CGA, and no PT bracing of RW for 2x 1 min during today's session and responded well to active interventions. Patient will benefit from continued skilled therapeutic intervention to address deficits in cognition, posture, BLE strength, gait, balance, and function in order to increase function and improve overall QOL.      PT Long Term Goals - 05/02/20 1233      PT LONG TERM GOAL #1   Title Patient will be modified independent with spousal support with HEP for improved strength and balance in order to decrease fall risk and dependence at home and in the community.    Baseline IE: not demonstrated; 6/15: good cargegiver participation and understanding    Time 12    Period Weeks    Status Achieved      PT LONG TERM GOAL #2   Title Patient will demonstrate improved function as evidenced by a score of 44 on FOTO measure for full participation in activities at home and in the community.    Baseline IE: 16; 6/15: deferred 2/2 to technical issues; 7/27: 38    Time 6    Period Weeks    Status On-going    Target Date 06/13/20      PT LONG TERM GOAL #3   Title Patient will perform STS with BUE support with Modified IND/SBA with VCs for sequencing and safety in order to participate more fully in basic ADLs.    Baseline IE: Mod/MinA, MAX VCs and TCs for sequencing/safety; 6/15: SBA, BUE // bar support, Min/Mod VCs for sequencing; 7/27: SBA, BUE // bar support, mod encouragement for participation    Time 12    Period Weeks    Status Achieved      PT LONG TERM GOAL #4   Title Patient will demonstrate improved B knee extension ROM as evidenced by lacking < 10 degrees of B knee extension PROM/AROM in order to achieve standing posture with greater independence.    Baseline IE: B lacking 15-20 degrees of knee extension; 6/15: R 0 degrees, L lacking  5 degrees    Time 12    Period Weeks    Status Achieved      PT LONG TERM GOAL #5   Title Patient will demonstrate improved static standing balance as evidenced by ability to maintain standing posture with CGA/SBA, SUE support for 4 minutes in order to participate in basic ADLs.    Baseline IE: 90 sec, MinA, BUE support; 6/15: 2.5 min, CGA, BUE support; 7/27: 4 min, SBA, BUE support    Time 6    Period Weeks    Status Partially Met    Target Date 06/13/20      Additional Long Term Goals   Additional Long Term Goals Yes      PT LONG TERM GOAL #6   Title  Patient will be able to ambulate with RW ~ 25 feet, CGA and VCs for improved ability to participate in ADLs within the home.    Baseline 7/27: // bars 6 feet, SBA, max VCs/encouragement    Time 6    Period Weeks    Status New    Target Date 06/13/20                 Plan - 06/13/20 1628    Clinical Impression Statement Patient presents to clinic alert and willing to participate in therapy. Patient demonstrates deficits in cognition, posture, BLE strength, gait, balance, and function. Patient able to perform 3 sets of 5 reps for STS with RW (PT bracing RW) and able to static stand with RW, BUE support, CGA, and no PT bracing of RW for 2x 1 min during today's session and responded well to active interventions. Patient will benefit from continued skilled therapeutic intervention to address deficits in cognition, posture, BLE strength, gait, balance, and function in order to increase function and improve overall QOL.    Personal Factors and Comorbidities Age;Education;Comorbidity 3+;Past/Current Experience;Time since onset of injury/illness/exacerbation;Fitness;Transportation;Other   Cognitive State   Comorbidities anemia, breast cancer, chronic constipation, diverticulosis, hypertension, hyperlipidemia, osteoporosis    Examination-Activity Limitations Bathing;Dressing;Transfers;Hygiene/Grooming;Bed Mobility;Squat;Lift;Bend;Locomotion  Level;Stairs;Reach Overhead;Stand;Toileting;Carry    Examination-Participation Restrictions Church;Interpersonal Relationship;Personal Finances;Yard Work;Cleaning;Laundry;Community Activity;Medication Management;Shop;Driving;Meal Prep    Stability/Clinical Decision Making Evolving/Moderate complexity    Rehab Potential Fair    PT Frequency 2x / week    PT Duration 6 weeks    PT Treatment/Interventions Moist Heat;DME Instruction;Therapeutic activities;Functional mobility training;Stair training;Gait training;Therapeutic exercise;Balance training;Neuromuscular re-education;Cognitive remediation;Orthotic Fit/Training;Patient/family education;Manual techniques;Scar mobilization;Passive range of motion;Energy conservation;Taping    PT Next Visit Plan RW gait in gym    PT Home Exercise Plan hamstring stretch    Consulted and Agree with Plan of Care Patient;Family member/caregiver    Family Member Consulted Cynthia Walker, husband           Patient will benefit from skilled therapeutic intervention in order to improve the following deficits and impairments:  Abnormal gait, Decreased balance, Decreased endurance, Decreased mobility, Difficulty walking, Hypomobility, Postural dysfunction, Improper body mechanics, Increased edema, Decreased range of motion, Decreased cognition, Decreased activity tolerance, Decreased coordination, Decreased safety awareness, Decreased strength, Impaired flexibility  Visit Diagnosis: Decreased range of motion (ROM) of both knees  Muscle weakness (generalized)  Difficulty in walking, not elsewhere classified     Problem List Patient Active Problem List   Diagnosis Date Noted  . Normocytic anemia 04/26/2020  . Encounter for hospice care discussion   . MRSA bacteremia 02/17/2020  . Streptococcal bacteremia 02/17/2020  . Bacteremia 02/17/2020  . History of breast cancer 02/17/2020  . Goals of care, counseling/discussion   . Palliative care by specialist   .  DNR (do not resuscitate) discussion   . Breast pain, right 01/11/2020  . Femoral neck fracture ([place]) 06/27/2019  . Femur fracture, left ([place]) 06/26/2019  . Right lower lobe pulmonary nodule 05/04/2019  . Syncope 09/27/2017  . Atypical chest pain 09/03/2017  . Essential hypertension 09/03/2017  . Short-term memory loss 09/03/2017  . Osteoporosis 04/02/2016  . Breast cancer, right [facility]) 04/06/2008   Myles Gip PT, DPT [phone]  06/13/2020, 4:39 PM  [facility] [address] [place], [place], [address] Phone: [phone]   Fax:  [phone]  Name: Cynthia Walker MRN: [id number] Date of Birth: Sep 11, 1935

## 2020-06-15 ENCOUNTER — Ambulatory Visit: Payer: Medicare HMO | Admitting: Physical Therapy

## 2020-06-19 ENCOUNTER — Ambulatory Visit: Payer: Medicare HMO | Admitting: Physical Therapy

## 2020-06-19 ENCOUNTER — Encounter: Payer: Self-pay | Admitting: Physical Therapy

## 2020-06-19 ENCOUNTER — Other Ambulatory Visit: Payer: Self-pay

## 2020-06-19 DIAGNOSIS — M25662 Stiffness of left knee, not elsewhere classified: Secondary | ICD-10-CM

## 2020-06-19 DIAGNOSIS — R262 Difficulty in walking, not elsewhere classified: Secondary | ICD-10-CM

## 2020-06-19 DIAGNOSIS — M6281 Muscle weakness (generalized): Secondary | ICD-10-CM

## 2020-06-19 DIAGNOSIS — M25661 Stiffness of right knee, not elsewhere classified: Secondary | ICD-10-CM | POA: Diagnosis not present

## 2020-06-19 NOTE — Addendum Note (Signed)
Addended by: Louie Casa on: 06/19/2020 03:54 PM   Modules accepted: Orders

## 2020-06-19 NOTE — Therapy (Signed)
[facility] [address] [place], [place], [address] Phone: [phone]   Fax:  [phone]  Physical Therapy Treatment/Re-Certification  Patient Details  Name: Cynthia Walker MRN: [id number] Date of Birth: 12/15/1934 Referring Provider (PT): Feldpausch   Encounter Date: 06/19/2020   PT End of Session - 06/19/20 1535    Visit Number 28    Number of Visits 36    Date for PT Re-Evaluation 07/17/20    PT Start Time 3474    PT Stop Time 1500    PT Time Calculation (min) 56 min    Equipment Utilized During Treatment Gait belt    Activity Tolerance Patient tolerated treatment well;Other (comment)   Tx limited 2/2 to attention span   Behavior During Therapy [facility] for tasks assessed/performed   Highly distractable          Past Medical History:  Diagnosis Date  . Breast cancer [facility]) 2009   Right breast CA with lumpectomy, radiation and chemo tx's.  . Dementia ([place])   . Hypertension   . Personal history of chemotherapy   . Personal history of radiation therapy     Past Surgical History:  Procedure Laterality Date  . BREAST BIOPSY Right 03/15/2008   invasive mammary carcinoma  . BREAST LUMPECTOMY Right 04/14/2008   Pathology revealed a 1.1 cm grade I invasive ductal carcinoma. Margins were negative.   . COLONOSCOPY WITH PROPOFOL N/A 07/10/2015   Procedure: COLONOSCOPY WITH PROPOFOL;  Surgeon: Hulen Luster, MD;  Location: [facility] ENDOSCOPY;  Service: Gastroenterology;  Laterality: N/A;  . INTRAMEDULLARY (IM) NAIL INTERTROCHANTERIC Left 06/27/2019   Procedure: INTRAMEDULLARY (IM) NAIL INTERTROCHANTRIC;  Surgeon: Creig Hines, MD;  Location: [facility] ORS;  Service: Orthopedics;  Laterality: Left;  . TONSILLECTOMY     age of 85 years old    There were no vitals filed for this visit.   Subjective Assessment - 06/19/20 1533    Subjective Patient presents to clinic stating "Oh no. You tricked me. This is a trap" upon seeing DPT, but was  ultimately amenable to participating in PT. Spouse notes that patient has been able to better perform toilet transfers with the addition of a new grab bar at home.    Patient is accompained by: Family member   husband, Cynthia Walker   Pertinent History Femur fracture, left (CMS-HCC) 06/26/2019; Patient was ambulatory without AD prior to the fall that resulted in fx. Patient's spouse notes that she was capable of walking ~0.5 miles prior to the fall. Patient's spouse reports that she can at present stand for up to 10 min, but not consistently and not without assistance. While working with HHPT patient was able to ambulate with a RW for ~ 200 feet with rest breaks throughout.    Limitations Lifting;Standing;Walking;House hold activities    How long can you sit comfortably? Unlimited    How long can you stand comfortably? 5-10 min with support    How long can you walk comfortably? < 200 feet    Patient Stated Goals 2/2 to cognitive state, patient did not state speicifc goals. Spouse would like to see her ambulatory within the house.    Currently in Pain? No/denies           TREATMENT  Neuromuscular Re-education: Gait in // bars, BUE support, 3x6 ft with minimal rest between bouts, min VCs for sequencing with some fluidity of gait present. Patient still requiring stabilization ofn L hip in SLS phase of gait.  Static standing  balance, // bars, SUE support, CGA, x2 min, min to VCs/encouragement Patient caregiver education on body mechanics and safe transfers from w/c to bed. Patient refusing to participate in demonstration in clinic.   Patient educated throughout session on appropriate technique and form using multi-modal cueing, HEP, and activity modification. Patient articulated understanding and returned demonstration.  Patient Response to interventions: Patient asking to leave.  ASSESSMENT Patient presents to clinic alert and willing to participate in therapy. Patient demonstrates deficits in  cognition, posture, BLE strength, gait, balance, and function. Patient continues to make progress towards goals with steady static standing, SUE support, CGA, for 2 min during today's session and responded well to active interventions. Patient continues to be limited with RW for safety reasons 2/2 to cognitive state, however, patient's gait in // bars is gradually becoming more fluid and symmetrical. Patient's condition has the potential to improve in response to therapy. Maximum improvement is yet to be obtained. The anticipated improvement is attainable and reasonable in a generally predictable time. Patient will benefit from continued skilled therapeutic intervention to address deficits in cognition, posture, BLE strength, gait, balance, and function in order to increase function and improve overall QOL.     PT Long Term Goals - 06/19/20 1536      PT LONG TERM GOAL #1   Title Patient will be modified independent with spousal support with HEP for improved strength and balance in order to decrease fall risk and dependence at home and in the community.    Baseline IE: not demonstrated; 6/15: good cargegiver participation and understanding    Time 12    Period Weeks    Status Achieved      PT LONG TERM GOAL #2   Title Patient will demonstrate improved function as evidenced by a score of 44 on FOTO measure for full participation in activities at home and in the community.    Baseline IE: 16; 6/15: deferred 2/2 to technical issues; 7/27: 38; 9/13: deferred 2/2 to technical issues    Time 4    Period Weeks    Status On-going    Target Date 07/17/20      PT LONG TERM GOAL #3   Title Patient will perform STS with BUE support with Modified IND/SBA with VCs for sequencing and safety in order to participate more fully in basic ADLs.    Baseline IE: Mod/MinA, MAX VCs and TCs for sequencing/safety; 6/15: SBA, BUE // bar support, Min/Mod VCs for sequencing; 7/27: SBA, BUE // bar support, mod encouragement  for participation    Time 12    Period Weeks    Status Achieved      PT LONG TERM GOAL #4   Title Patient will demonstrate improved B knee extension ROM as evidenced by lacking < 10 degrees of B knee extension PROM/AROM in order to achieve standing posture with greater independence.    Baseline IE: B lacking 15-20 degrees of knee extension; 6/15: R 0 degrees, L lacking 5 degrees    Time 12    Period Weeks    Status Achieved      PT LONG TERM GOAL #5   Title Patient will demonstrate improved static standing balance as evidenced by ability to maintain standing posture with CGA/SBA, SUE support for 4 minutes in order to participate in basic ADLs.    Baseline IE: 90 sec, MinA, BUE support; 6/15: 2.5 min, CGA, BUE support; 7/27: 4 min, SBA, BUE support; 9/13: 2 min, CGA, SUE support  Time 4    Period Weeks    Status On-going    Target Date 07/17/20      PT LONG TERM GOAL #6   Title Patient will be able to ambulate with RW ~ 25 feet, CGA and VCs for improved ability to participate in ADLs within the home.    Baseline 7/27: // bars 6 feet, SBA, max VCs/encouragement; 9/13: // bars, 18 ft, SBA/CGA, min to no VCs/encouragement    Time 4    Period Weeks    Status On-going    Target Date 07/17/20                 Plan - 06/19/20 1535    Clinical Impression Statement Patient presents to clinic alert and willing to participate in therapy. Patient demonstrates deficits in cognition, posture, BLE strength, gait, balance, and function. Patient continues to make progress towards goals with steady static standing, SUE support, CGA, for 2 min during today's session and responded well to active interventions. Patient continues to be limited with RW for safety reasons 2/2 to cognitive state, however, patient's gait in // bars is gradually becoming more fluid and symmetrical. Patient's condition has the potential to improve in response to therapy. Maximum improvement is yet to be obtained. The  anticipated improvement is attainable and reasonable in a generally predictable time. Patient will benefit from continued skilled therapeutic intervention to address deficits in cognition, posture, BLE strength, gait, balance, and function in order to increase function and improve overall QOL.    Personal Factors and Comorbidities Age;Education;Comorbidity 3+;Past/Current Experience;Time since onset of injury/illness/exacerbation;Fitness;Transportation;Other   Cognitive State   Comorbidities anemia, breast cancer, chronic constipation, diverticulosis, hypertension, hyperlipidemia, osteoporosis    Examination-Activity Limitations Bathing;Dressing;Transfers;Hygiene/Grooming;Bed Mobility;Squat;Lift;Bend;Locomotion Level;Stairs;Reach Overhead;Stand;Toileting;Carry    Examination-Participation Restrictions Church;Interpersonal Relationship;Personal Finances;Yard Work;Cleaning;Laundry;Community Activity;Medication Management;Shop;Driving;Meal Prep    Stability/Clinical Decision Making Evolving/Moderate complexity    Rehab Potential Fair    PT Frequency 2x / week    PT Duration 6 weeks    PT Treatment/Interventions Moist Heat;DME Instruction;Therapeutic activities;Functional mobility training;Stair training;Gait training;Therapeutic exercise;Balance training;Neuromuscular re-education;Cognitive remediation;Orthotic Fit/Training;Patient/family education;Manual techniques;Scar mobilization;Passive range of motion;Energy conservation;Taping    PT Next Visit Plan RW gait in gym    PT Home Exercise Plan hamstring stretch    Consulted and Agree with Plan of Care Patient;Family member/caregiver    Family Member Consulted Carlyne Keehan, husband           Patient will benefit from skilled therapeutic intervention in order to improve the following deficits and impairments:  Abnormal gait, Decreased balance, Decreased endurance, Decreased mobility, Difficulty walking, Hypomobility, Postural dysfunction, Improper  body mechanics, Increased edema, Decreased range of motion, Decreased cognition, Decreased activity tolerance, Decreased coordination, Decreased safety awareness, Decreased strength, Impaired flexibility  Visit Diagnosis: Decreased range of motion (ROM) of both knees  Muscle weakness (generalized)  Difficulty in walking, not elsewhere classified     Problem List Patient Active Problem List   Diagnosis Date Noted  . Normocytic anemia 04/26/2020  . Encounter for hospice care discussion   . MRSA bacteremia 02/17/2020  . Streptococcal bacteremia 02/17/2020  . Bacteremia 02/17/2020  . History of breast cancer 02/17/2020  . Goals of care, counseling/discussion   . Palliative care by specialist   . DNR (do not resuscitate) discussion   . Breast pain, right 01/11/2020  . Femoral neck fracture ([place]) 06/27/2019  . Femur fracture, left ([place]) 06/26/2019  . Right lower lobe pulmonary nodule 05/04/2019  . Syncope 09/27/2017  . Atypical chest pain  09/03/2017  . Essential hypertension 09/03/2017  . Short-term memory loss 09/03/2017  . Osteoporosis 04/02/2016  . Breast cancer, right [facility]) 04/06/2008   Myles Gip PT, DPT [phone]  06/19/2020, 3:52 PM  [place] [facility] [address] [place], [place], [address] Phone: [phone]   Fax:  [phone]  Name: AMANDO ISHIKAWA MRN: [id number] Date of Birth: 1935/05/23

## 2020-06-21 ENCOUNTER — Ambulatory Visit: Admission: RE | Admit: 2020-06-21 | Payer: Medicare HMO | Source: Ambulatory Visit

## 2020-06-21 ENCOUNTER — Other Ambulatory Visit: Payer: Self-pay

## 2020-06-26 ENCOUNTER — Ambulatory Visit: Payer: Medicare HMO | Admitting: Physical Therapy

## 2020-06-26 ENCOUNTER — Encounter: Payer: Self-pay | Admitting: Physical Therapy

## 2020-06-26 ENCOUNTER — Other Ambulatory Visit: Payer: Self-pay

## 2020-06-26 DIAGNOSIS — M25661 Stiffness of right knee, not elsewhere classified: Secondary | ICD-10-CM

## 2020-06-26 DIAGNOSIS — M6281 Muscle weakness (generalized): Secondary | ICD-10-CM

## 2020-06-26 DIAGNOSIS — R262 Difficulty in walking, not elsewhere classified: Secondary | ICD-10-CM

## 2020-06-26 NOTE — Therapy (Signed)
[facility] [address] [place], [place], [address] Phone: [phone]   Fax:  [phone]  Physical Therapy Treatment  Patient Details  Name: Cynthia Walker MRN: [id number] Date of Birth: April 19, 1935 Referring Provider (PT): Feldpausch   Encounter Date: 06/26/2020   PT End of Session - 06/26/20 1238    Visit Number 29    Number of Visits 36    Date for PT Re-Evaluation 07/17/20    PT Start Time 1120    PT Stop Time 1205    PT Time Calculation (min) 45 min    Equipment Utilized During Treatment Gait belt    Activity Tolerance Patient tolerated treatment well;Other (comment)   Tx limited 2/2 to attention span   Behavior During Therapy [facility] for tasks assessed/performed   Highly distractable          Past Medical History:  Diagnosis Date   Breast cancer ([place]) 2009   Right breast CA with lumpectomy, radiation and chemo tx's.   Dementia ([place])    Hypertension    Personal history of chemotherapy    Personal history of radiation therapy     Past Surgical History:  Procedure Laterality Date   BREAST BIOPSY Right 03/15/2008   invasive mammary carcinoma   BREAST LUMPECTOMY Right 04/14/2008   Pathology revealed a 1.1 cm grade I invasive ductal carcinoma. Margins were negative.    COLONOSCOPY WITH PROPOFOL N/A 07/10/2015   Procedure: COLONOSCOPY WITH PROPOFOL;  Surgeon: Hulen Luster, MD;  Location: [facility] ENDOSCOPY;  Service: Gastroenterology;  Laterality: N/A;   INTRAMEDULLARY (IM) NAIL INTERTROCHANTERIC Left 06/27/2019   Procedure: INTRAMEDULLARY (IM) NAIL INTERTROCHANTRIC;  Surgeon: Creig Hines, MD;  Location: [facility] ORS;  Service: Orthopedics;  Laterality: Left;   TONSILLECTOMY     age of 85 years old    There were no vitals filed for this visit.   Subjective Assessment - 06/26/20 1233    Subjective Patient presents to clinic 20 minutes late for appointment due to waking up late, but was amenable to participating  in PT. When asked how her legs are feeling today, patient responds "I have both of them, so you tell me."    Patient is accompained by: Family member   husband, Cynthia Walker   Pertinent History Femur fracture, left (CMS-HCC) 06/26/2019; Patient was ambulatory without AD prior to the fall that resulted in fx. Patient's spouse notes that she was capable of walking ~0.5 miles prior to the fall. Patient's spouse reports that she can at present stand for up to 10 min, but not consistently and not without assistance. While working with HHPT patient was able to ambulate with a RW for ~ 200 feet with rest breaks throughout.    Limitations Lifting;Standing;Walking;House hold activities    How long can you sit comfortably? Unlimited    How long can you stand comfortably? 5-10 min with support    How long can you walk comfortably? < 200 feet    Patient Stated Goals 2/2 to cognitive state, patient did not state speicifc goals. Spouse would like to see her ambulatory within the house.    Currently in Pain? No/denies           TREATMENT  Neuromuscular Re-education: STS with RW from w/c, mod VCs for sequencing and safety. Patient leaning posteriorly with greater frequency today but able to correct with VC and TC. Faded PT bracing RW for safety throughout. CGA, 4x5  Static standing balance, RW, CGA, 3x1 min. Max VCs  for safe DME use and posture. Faded PT bracing of RW.     Patient educated throughout session on appropriate technique and form using multi-modal cueing, HEP, and activity modification. Patient articulated understanding and returned demonstration.  Patient Response to interventions: Patient notes she is ready to leave.  ASSESSMENT Patient presents to clinic alert and willing to participate in therapy. Patient demonstrates deficits in cognition, posture, BLE strength, gait, balance, and function. Patient able to perform increased repetitions of STS with less reliance on PT for RW bracing/safety during  today's session and responded well to active interventions. Patient will benefit from continued skilled therapeutic intervention to address deficits in cognition, posture, BLE strength, gait, balance, and function in order to increase function and improve overall QOL.      PT Long Term Goals - 06/19/20 1536      PT LONG TERM GOAL #1   Title Patient will be modified independent with spousal support with HEP for improved strength and balance in order to decrease fall risk and dependence at home and in the community.    Baseline IE: not demonstrated; 6/15: good cargegiver participation and understanding    Time 12    Period Weeks    Status Achieved      PT LONG TERM GOAL #2   Title Patient will demonstrate improved function as evidenced by a score of 44 on FOTO measure for full participation in activities at home and in the community.    Baseline IE: 16; 6/15: deferred 2/2 to technical issues; 7/27: 38; 9/13: deferred 2/2 to technical issues    Time 4    Period Weeks    Status On-going    Target Date 07/17/20      PT LONG TERM GOAL #3   Title Patient will perform STS with BUE support with Modified IND/SBA with VCs for sequencing and safety in order to participate more fully in basic ADLs.    Baseline IE: Mod/MinA, MAX VCs and TCs for sequencing/safety; 6/15: SBA, BUE // bar support, Min/Mod VCs for sequencing; 7/27: SBA, BUE // bar support, mod encouragement for participation    Time 12    Period Weeks    Status Achieved      PT LONG TERM GOAL #4   Title Patient will demonstrate improved B knee extension ROM as evidenced by lacking < 10 degrees of B knee extension PROM/AROM in order to achieve standing posture with greater independence.    Baseline IE: B lacking 15-20 degrees of knee extension; 6/15: R 0 degrees, L lacking 5 degrees    Time 12    Period Weeks    Status Achieved      PT LONG TERM GOAL #5   Title Patient will demonstrate improved static standing balance as evidenced  by ability to maintain standing posture with CGA/SBA, SUE support for 4 minutes in order to participate in basic ADLs.    Baseline IE: 90 sec, MinA, BUE support; 6/15: 2.5 min, CGA, BUE support; 7/27: 4 min, SBA, BUE support; 9/13: 2 min, CGA, SUE support    Time 4    Period Weeks    Status On-going    Target Date 07/17/20      PT LONG TERM GOAL #6   Title Patient will be able to ambulate with RW ~ 25 feet, CGA and VCs for improved ability to participate in ADLs within the home.    Baseline 7/27: // bars 6 feet, SBA, max VCs/encouragement; 9/13: // bars, 18 ft, SBA/CGA,  min to no VCs/encouragement    Time 4    Period Weeks    Status On-going    Target Date 07/17/20                 Plan - 06/26/20 1238    Clinical Impression Statement Patient presents to clinic alert and willing to participate in therapy. Patient demonstrates deficits in cognition, posture, BLE strength, gait, balance, and function. Patient able to perform increased repetitions of STS with less reliance on PT for RW bracing/safety during today's session and responded well to active interventions. Patient will benefit from continued skilled therapeutic intervention to address deficits in cognition, posture, BLE strength, gait, balance, and function in order to increase function and improve overall QOL.    Personal Factors and Comorbidities Age;Education;Comorbidity 3+;Past/Current Experience;Time since onset of injury/illness/exacerbation;Fitness;Transportation;Other   Cognitive State   Comorbidities anemia, breast cancer, chronic constipation, diverticulosis, hypertension, hyperlipidemia, osteoporosis    Examination-Activity Limitations Bathing;Dressing;Transfers;Hygiene/Grooming;Bed Mobility;Squat;Lift;Bend;Locomotion Level;Stairs;Reach Overhead;Stand;Toileting;Carry    Examination-Participation Restrictions Church;Interpersonal Relationship;Personal Finances;Yard Work;Cleaning;Laundry;Community Activity;Medication  Management;Shop;Driving;Meal Prep    Stability/Clinical Decision Making Evolving/Moderate complexity    Rehab Potential Fair    PT Frequency 2x / week    PT Duration 6 weeks    PT Treatment/Interventions Moist Heat;DME Instruction;Therapeutic activities;Functional mobility training;Stair training;Gait training;Therapeutic exercise;Balance training;Neuromuscular re-education;Cognitive remediation;Orthotic Fit/Training;Patient/family education;Manual techniques;Scar mobilization;Passive range of motion;Energy conservation;Taping    PT Next Visit Plan RW gait in gym    PT Home Exercise Plan hamstring stretch    Consulted and Agree with Plan of Care Patient;Family member/caregiver    Family Member Consulted Cynthia Walker, husband           Patient will benefit from skilled therapeutic intervention in order to improve the following deficits and impairments:  Abnormal gait, Decreased balance, Decreased endurance, Decreased mobility, Difficulty walking, Hypomobility, Postural dysfunction, Improper body mechanics, Increased edema, Decreased range of motion, Decreased cognition, Decreased activity tolerance, Decreased coordination, Decreased safety awareness, Decreased strength, Impaired flexibility  Visit Diagnosis: Decreased range of motion (ROM) of both knees  Muscle weakness (generalized)  Difficulty in walking, not elsewhere classified     Problem List Patient Active Problem List   Diagnosis Date Noted   Normocytic anemia 04/26/2020   Encounter for hospice care discussion    MRSA bacteremia 02/17/2020   Streptococcal bacteremia 02/17/2020   Bacteremia 02/17/2020   History of breast cancer 02/17/2020   Goals of care, counseling/discussion    Palliative care by specialist    DNR (do not resuscitate) discussion    Breast pain, right 01/11/2020   Femoral neck fracture ([place]) 06/27/2019   Femur fracture, left ([place]) 06/26/2019   Right lower lobe pulmonary nodule  05/04/2019   Syncope 09/27/2017   Atypical chest pain 09/03/2017   Essential hypertension 09/03/2017   Short-term memory loss 09/03/2017   Osteoporosis 04/02/2016   Breast cancer, right ([place]) 04/06/2008   Myles Gip PT, DPT [phone]  06/26/2020, 12:44 PM  [facility] [address] [place], [place], [address] Phone: [phone]   Fax:  [phone]  Name: Cynthia Walker MRN: [id number] Date of Birth: [birth year]

## 2020-07-03 ENCOUNTER — Ambulatory Visit: Payer: Medicare HMO | Admitting: Physical Therapy

## 2020-07-03 ENCOUNTER — Encounter: Payer: Self-pay | Admitting: Physical Therapy

## 2020-07-03 ENCOUNTER — Other Ambulatory Visit: Payer: Self-pay

## 2020-07-03 DIAGNOSIS — M25662 Stiffness of left knee, not elsewhere classified: Secondary | ICD-10-CM

## 2020-07-03 DIAGNOSIS — M25661 Stiffness of right knee, not elsewhere classified: Secondary | ICD-10-CM | POA: Diagnosis not present

## 2020-07-03 DIAGNOSIS — R262 Difficulty in walking, not elsewhere classified: Secondary | ICD-10-CM

## 2020-07-03 DIAGNOSIS — M6281 Muscle weakness (generalized): Secondary | ICD-10-CM

## 2020-07-03 NOTE — Therapy (Signed)
[facility] [address] [place], [place], [address] Phone: [phone]   Fax:  [phone]  Physical Therapy Treatment Physical Therapy Progress Note   Dates of reporting period  05/11/2020   to   07/03/2020   Patient Details  Name: Cynthia Walker MRN: [id number] Date of Birth: 04-11-1935 Referring Provider (PT): Feldpausch   Encounter Date: 07/03/2020   PT End of Session - 07/03/20 1251    Visit Number 30    Number of Visits 36    Date for PT Re-Evaluation 07/17/20    PT Start Time 1101    PT Stop Time 1200    PT Time Calculation (min) 59 min    Equipment Utilized During Treatment Gait belt    Activity Tolerance Patient tolerated treatment well;Other (comment)   Tx limited 2/2 to attention span   Behavior During Therapy [facility] for tasks assessed/performed   Highly distractable          Past Medical History:  Diagnosis Date  . Breast cancer [facility]) 2009   Right breast CA with lumpectomy, radiation and chemo tx's.  . Dementia ([place])   . Hypertension   . Personal history of chemotherapy   . Personal history of radiation therapy     Past Surgical History:  Procedure Laterality Date  . BREAST BIOPSY Right 03/15/2008   invasive mammary carcinoma  . BREAST LUMPECTOMY Right 04/14/2008   Pathology revealed a 1.1 cm grade I invasive ductal carcinoma. Margins were negative.   . COLONOSCOPY WITH PROPOFOL N/A 07/10/2015   Procedure: COLONOSCOPY WITH PROPOFOL;  Surgeon: Hulen Luster, MD;  Location: [facility] ENDOSCOPY;  Service: Gastroenterology;  Laterality: N/A;  . INTRAMEDULLARY (IM) NAIL INTERTROCHANTERIC Left 06/27/2019   Procedure: INTRAMEDULLARY (IM) NAIL INTERTROCHANTRIC;  Surgeon: Creig Hines, MD;  Location: [facility] ORS;  Service: Orthopedics;  Laterality: Left;  . TONSILLECTOMY     age of 85 years old    There were no vitals filed for this visit.   Subjective Assessment - 07/03/20 1249    Subjective Patient presents to  clinic a bit somnolent, but amenable to therapy. Patient's spouse notes that patient was able to stand with BUE support on grab bar for bathing/hygiene this morning without complaint not unsteadiness for > 15 minutes.    Patient is accompained by: Family member   husband, [name]   Pertinent History Femur fracture, left (CMS-HCC) 06/26/2019; Patient was ambulatory without AD prior to the fall that resulted in fx. Patient's spouse notes that she was capable of walking ~0.5 miles prior to the fall. Patient's spouse reports that she can at present stand for up to 10 min, but not consistently and not without assistance. While working with HHPT patient was able to ambulate with a RW for ~ 200 feet with rest breaks throughout.    Limitations Lifting;Standing;Walking;House hold activities    How long can you sit comfortably? Unlimited    How long can you stand comfortably? 5-10 min with support    How long can you walk comfortably? < 200 feet    Patient Stated Goals 2/2 to cognitive state, patient did not state speicifc goals. Spouse would like to see her ambulatory within the house.    Currently in Pain? No/denies           TREATMENT  Neuromuscular Re-education: // bars: STS, BUE support, SBA, x5, with min encouragment and reorientation to task Gait x6 feet, BUE support, CGA with intermittent min A at left hip for  stabilization during SLS. W/C follow.  Therapeutic Exercise: Seated strengthening:   Hip flexion march x10, BLE  LAQ, x10 BLE (Increased VCs and encouragement for attention to task and completion of task.) Gentle seated hamstring stretch for improved LE in standing, 2x 1 min  Patient educated throughout session on appropriate technique and form using multi-modal cueing, HEP, and activity modification. Patient articulated understanding and returned demonstration.  Patient Response to interventions: Patient reports she can't do any more today.  ASSESSMENT Patient presents to clinic  alert and willing to participate in therapy. Patient demonstrates deficits in cognition, posture, BLE strength, gait, balance, and function. Patient continues to be able to coordinate seated strengthening activities as well as improved gait fluidity during today's session and responded well to active interventions. Patient will benefit from continued skilled therapeutic intervention to address deficits in cognition, posture, BLE strength, gait, balance, and function in order to increase function and improve overall QOL.     PT Long Term Goals - 06/19/20 1536      PT LONG TERM GOAL #1   Title Patient will be modified independent with spousal support with HEP for improved strength and balance in order to decrease fall risk and dependence at home and in the community.    Baseline IE: not demonstrated; 6/15: good cargegiver participation and understanding    Time 12    Period Weeks    Status Achieved      PT LONG TERM GOAL #2   Title Patient will demonstrate improved function as evidenced by a score of 44 on FOTO measure for full participation in activities at home and in the community.    Baseline IE: 16; 6/15: deferred 2/2 to technical issues; 7/27: 38; 9/13: deferred 2/2 to technical issues    Time 4    Period Weeks    Status On-going    Target Date 07/17/20      PT LONG TERM GOAL #3   Title Patient will perform STS with BUE support with Modified IND/SBA with VCs for sequencing and safety in order to participate more fully in basic ADLs.    Baseline IE: Mod/MinA, MAX VCs and TCs for sequencing/safety; 6/15: SBA, BUE // bar support, Min/Mod VCs for sequencing; 7/27: SBA, BUE // bar support, mod encouragement for participation    Time 12    Period Weeks    Status Achieved      PT LONG TERM GOAL #4   Title Patient will demonstrate improved B knee extension ROM as evidenced by lacking < 10 degrees of B knee extension PROM/AROM in order to achieve standing posture with greater independence.      Baseline IE: B lacking 15-20 degrees of knee extension; 6/15: R 0 degrees, L lacking 5 degrees    Time 12    Period Weeks    Status Achieved      PT LONG TERM GOAL #5   Title Patient will demonstrate improved static standing balance as evidenced by ability to maintain standing posture with CGA/SBA, SUE support for 4 minutes in order to participate in basic ADLs.    Baseline IE: 90 sec, MinA, BUE support; 6/15: 2.5 min, CGA, BUE support; 7/27: 4 min, SBA, BUE support; 9/13: 2 min, CGA, SUE support    Time 4    Period Weeks    Status On-going    Target Date 07/17/20      PT LONG TERM GOAL #6   Title Patient will be able to ambulate with RW ~ 25  feet, CGA and VCs for improved ability to participate in ADLs within the home.    Baseline 7/27: // bars 6 feet, SBA, max VCs/encouragement; 9/13: // bars, 18 ft, SBA/CGA, min to no VCs/encouragement    Time 4    Period Weeks    Status On-going    Target Date 07/17/20                 Plan - 07/03/20 1251    Clinical Impression Statement Patient presents to clinic alert and willing to participate in therapy. Patient demonstrates deficits in cognition, posture, BLE strength, gait, balance, and function. Patient continues to be able to coordinate seated strengthening activities as well as improved gait fluidity during today's session and responded well to active interventions. Patient will benefit from continued skilled therapeutic intervention to address deficits in cognition, posture, BLE strength, gait, balance, and function in order to increase function and improve overall QOL.    Personal Factors and Comorbidities Age;Education;Comorbidity 3+;Past/Current Experience;Time since onset of injury/illness/exacerbation;Fitness;Transportation;Other   Cognitive State   Comorbidities anemia, breast cancer, chronic constipation, diverticulosis, hypertension, hyperlipidemia, osteoporosis    Examination-Activity Limitations  Bathing;Dressing;Transfers;Hygiene/Grooming;Bed Mobility;Squat;Lift;Bend;Locomotion Level;Stairs;Reach Overhead;Stand;Toileting;Carry    Examination-Participation Restrictions Church;Interpersonal Relationship;Personal Finances;Yard Work;Cleaning;Laundry;Community Activity;Medication Management;Shop;Driving;Meal Prep    Stability/Clinical Decision Making Evolving/Moderate complexity    Rehab Potential Fair    PT Frequency 2x / week    PT Duration 6 weeks    PT Treatment/Interventions Moist Heat;DME Instruction;Therapeutic activities;Functional mobility training;Stair training;Gait training;Therapeutic exercise;Balance training;Neuromuscular re-education;Cognitive remediation;Orthotic Fit/Training;Patient/family education;Manual techniques;Scar mobilization;Passive range of motion;Energy conservation;Taping    PT Next Visit Plan RW gait in gym    PT Home Exercise Plan hamstring stretch    Consulted and Agree with Plan of Care Patient;Family member/caregiver    Family Member Consulted Jull Harral, husband           Patient will benefit from skilled therapeutic intervention in order to improve the following deficits and impairments:  Abnormal gait, Decreased balance, Decreased endurance, Decreased mobility, Difficulty walking, Hypomobility, Postural dysfunction, Improper body mechanics, Increased edema, Decreased range of motion, Decreased cognition, Decreased activity tolerance, Decreased coordination, Decreased safety awareness, Decreased strength, Impaired flexibility  Visit Diagnosis: Decreased range of motion (ROM) of both knees  Muscle weakness (generalized)  Difficulty in walking, not elsewhere classified     Problem List Patient Active Problem List   Diagnosis Date Noted  . Normocytic anemia 04/26/2020  . Encounter for hospice care discussion   . MRSA bacteremia 02/17/2020  . Streptococcal bacteremia 02/17/2020  . Bacteremia 02/17/2020  . History of breast cancer  02/17/2020  . Goals of care, counseling/discussion   . Palliative care by specialist   . DNR (do not resuscitate) discussion   . Breast pain, right 01/11/2020  . Femoral neck fracture ([place]) 06/27/2019  . Femur fracture, left ([place]) 06/26/2019  . Right lower lobe pulmonary nodule 05/04/2019  . Syncope 09/27/2017  . Atypical chest pain 09/03/2017  . Essential hypertension 09/03/2017  . Short-term memory loss 09/03/2017  . Osteoporosis 04/02/2016  . Breast cancer, right [facility]) 04/06/2008   Myles Gip PT, DPT [phone]  07/03/2020, 12:58 PM  [place] [facility] [address] [place], [place], [address] Phone: [phone]   Fax:  [phone]  Name: AMANDY CHUBBUCK MRN: [id number] Date of Birth: August 17, 1935

## 2020-07-05 ENCOUNTER — Ambulatory Visit: Payer: Medicare HMO | Admitting: Physical Therapy

## 2020-07-10 ENCOUNTER — Other Ambulatory Visit: Payer: Self-pay

## 2020-07-10 ENCOUNTER — Ambulatory Visit: Payer: Medicare HMO | Attending: Family Medicine | Admitting: Physical Therapy

## 2020-07-10 ENCOUNTER — Encounter: Payer: Self-pay | Admitting: Physical Therapy

## 2020-07-10 DIAGNOSIS — M6281 Muscle weakness (generalized): Secondary | ICD-10-CM | POA: Diagnosis present

## 2020-07-10 DIAGNOSIS — R262 Difficulty in walking, not elsewhere classified: Secondary | ICD-10-CM | POA: Diagnosis present

## 2020-07-10 DIAGNOSIS — M25661 Stiffness of right knee, not elsewhere classified: Secondary | ICD-10-CM | POA: Insufficient documentation

## 2020-07-10 DIAGNOSIS — M25662 Stiffness of left knee, not elsewhere classified: Secondary | ICD-10-CM | POA: Diagnosis present

## 2020-07-10 NOTE — Therapy (Signed)
[facility] [address] [place], [place], [address] Phone: [phone]   Fax:  [phone]  Physical Therapy Treatment  Patient Details  Name: Cynthia Walker MRN: [id number] Date of Birth: Jun 30, 1935 Referring Provider (PT): Feldpausch   Encounter Date: 07/10/2020   PT End of Session - 07/10/20 1425    Visit Number 31    Number of Visits 36    Date for PT Re-Evaluation 07/17/20    PT Start Time 1100    PT Stop Time 1154    PT Time Calculation (min) 54 min    Equipment Utilized During Treatment Gait belt    Activity Tolerance Patient tolerated treatment well;Other (comment)   Tx limited 2/2 to attention span   Behavior During Therapy [facility] for tasks assessed/performed   Highly distractable          Past Medical History:  Diagnosis Date   Breast cancer ([place]) 2009   Right breast CA with lumpectomy, radiation and chemo tx's.   Dementia ([place])    Hypertension    Personal history of chemotherapy    Personal history of radiation therapy     Past Surgical History:  Procedure Laterality Date   BREAST BIOPSY Right 03/15/2008   invasive mammary carcinoma   BREAST LUMPECTOMY Right 04/14/2008   Pathology revealed a 1.1 cm grade I invasive ductal carcinoma. Margins were negative.    COLONOSCOPY WITH PROPOFOL N/A 07/10/2015   Procedure: COLONOSCOPY WITH PROPOFOL;  Surgeon: Hulen Luster, MD;  Location: [facility] ENDOSCOPY;  Service: Gastroenterology;  Laterality: N/A;   INTRAMEDULLARY (IM) NAIL INTERTROCHANTERIC Left 06/27/2019   Procedure: INTRAMEDULLARY (IM) NAIL INTERTROCHANTRIC;  Surgeon: Creig Hines, MD;  Location: [facility] ORS;  Service: Orthopedics;  Laterality: Left;   TONSILLECTOMY     age of 85 years old    There were no vitals filed for this visit.   Subjective Assessment - 07/10/20 1424    Subjective Patient arrives to clinic alert and oriented. Patient's spouse notes that patient continues to be able to stand  (holding grab bar) for 15 minutes for hygiene/grooming.    Patient is accompained by: Family member   husband, Cynthia Walker   Pertinent History Femur fracture, left (CMS-HCC) 06/26/2019; Patient was ambulatory without AD prior to the fall that resulted in fx. Patient's spouse notes that she was capable of walking ~0.5 miles prior to the fall. Patient's spouse reports that she can at present stand for up to 10 min, but not consistently and not without assistance. While working with HHPT patient was able to ambulate with a RW for ~ 200 feet with rest breaks throughout.    Limitations Lifting;Standing;Walking;House hold activities    How long can you sit comfortably? Unlimited    How long can you stand comfortably? 5-10 min with support    How long can you walk comfortably? < 200 feet    Patient Stated Goals 2/2 to cognitive state, patient did not state speicifc goals. Spouse would like to see her ambulatory within the house.    Currently in Pain? No/denies           TREATMENT  Neuromuscular Re-education: // bars: STS, BUE support, SBA, x5, with min encouragment and reorientation to task Gait with BUE support, SBA/CGA, x3, min/mod VCs for sequencing and orientation to task Static standing with BUE support, SBA, 3x 1 min   Patient educated throughout session on appropriate technique and form using multi-modal cueing, HEP, and activity modification. Patient articulated understanding and  returned demonstration.  Patient Response to interventions: Patient states she cannot do anymore  ASSESSMENT Patient presents to clinic alert and willing to participate in therapy. Patient demonstrates deficits in cognition, posture, BLE strength, gait, balance, and function. Patient walking with increased stride length (L>R) during today's session and responded well to active interventions despite requiring increased rest between bouts of activity. Patient will benefit from continued skilled therapeutic intervention to  address deficits in cognition, posture, BLE strength, gait, balance, and function in order to increase function and improve overall QOL.     PT Long Term Goals - 06/19/20 1536      PT LONG TERM GOAL #1   Title Patient will be modified independent with spousal support with HEP for improved strength and balance in order to decrease fall risk and dependence at home and in the community.    Baseline IE: not demonstrated; 6/15: good cargegiver participation and understanding    Time 12    Period Weeks    Status Achieved      PT LONG TERM GOAL #2   Title Patient will demonstrate improved function as evidenced by a score of 44 on FOTO measure for full participation in activities at home and in the community.    Baseline IE: 16; 6/15: deferred 2/2 to technical issues; 7/27: 38; 9/13: deferred 2/2 to technical issues    Time 4    Period Weeks    Status On-going    Target Date 07/17/20      PT LONG TERM GOAL #3   Title Patient will perform STS with BUE support with Modified IND/SBA with VCs for sequencing and safety in order to participate more fully in basic ADLs.    Baseline IE: Mod/MinA, MAX VCs and TCs for sequencing/safety; 6/15: SBA, BUE // bar support, Min/Mod VCs for sequencing; 7/27: SBA, BUE // bar support, mod encouragement for participation    Time 12    Period Weeks    Status Achieved      PT LONG TERM GOAL #4   Title Patient will demonstrate improved B knee extension ROM as evidenced by lacking < 10 degrees of B knee extension PROM/AROM in order to achieve standing posture with greater independence.    Baseline IE: B lacking 15-20 degrees of knee extension; 6/15: R 0 degrees, L lacking 5 degrees    Time 12    Period Weeks    Status Achieved      PT LONG TERM GOAL #5   Title Patient will demonstrate improved static standing balance as evidenced by ability to maintain standing posture with CGA/SBA, SUE support for 4 minutes in order to participate in basic ADLs.    Baseline IE:  90 sec, MinA, BUE support; 6/15: 2.5 min, CGA, BUE support; 7/27: 4 min, SBA, BUE support; 9/13: 2 min, CGA, SUE support    Time 4    Period Weeks    Status On-going    Target Date 07/17/20      PT LONG TERM GOAL #6   Title Patient will be able to ambulate with RW ~ 25 feet, CGA and VCs for improved ability to participate in ADLs within the home.    Baseline 7/27: // bars 6 feet, SBA, max VCs/encouragement; 9/13: // bars, 18 ft, SBA/CGA, min to no VCs/encouragement    Time 4    Period Weeks    Status On-going    Target Date 07/17/20  Plan - 07/10/20 1425    Clinical Impression Statement Patient presents to clinic alert and willing to participate in therapy. Patient demonstrates deficits in cognition, posture, BLE strength, gait, balance, and function. Patient walking with increased stride length (L>R) during today's session and responded well to active interventions despite requiring increased rest between bouts of activity. Patient will benefit from continued skilled therapeutic intervention to address deficits in cognition, posture, BLE strength, gait, balance, and function in order to increase function and improve overall QOL.    Personal Factors and Comorbidities Age;Education;Comorbidity 3+;Past/Current Experience;Time since onset of injury/illness/exacerbation;Fitness;Transportation;Other   Cognitive State   Comorbidities anemia, breast cancer, chronic constipation, diverticulosis, hypertension, hyperlipidemia, osteoporosis    Examination-Activity Limitations Bathing;Dressing;Transfers;Hygiene/Grooming;Bed Mobility;Squat;Lift;Bend;Locomotion Level;Stairs;Reach Overhead;Stand;Toileting;Carry    Examination-Participation Restrictions Church;Interpersonal Relationship;Personal Finances;Yard Work;Cleaning;Laundry;Community Activity;Medication Management;Shop;Driving;Meal Prep    Stability/Clinical Decision Making Evolving/Moderate complexity    Rehab Potential Fair     PT Frequency 2x / week    PT Duration 6 weeks    PT Treatment/Interventions Moist Heat;DME Instruction;Therapeutic activities;Functional mobility training;Stair training;Gait training;Therapeutic exercise;Balance training;Neuromuscular re-education;Cognitive remediation;Orthotic Fit/Training;Patient/family education;Manual techniques;Scar mobilization;Passive range of motion;Energy conservation;Taping    PT Next Visit Plan RW gait in gym    PT Home Exercise Plan hamstring stretch    Consulted and Agree with Plan of Care Patient;Family member/caregiver    Family Member Consulted Cynthia Walker, husband           Patient will benefit from skilled therapeutic intervention in order to improve the following deficits and impairments:  Abnormal gait, Decreased balance, Decreased endurance, Decreased mobility, Difficulty walking, Hypomobility, Postural dysfunction, Improper body mechanics, Increased edema, Decreased range of motion, Decreased cognition, Decreased activity tolerance, Decreased coordination, Decreased safety awareness, Decreased strength, Impaired flexibility  Visit Diagnosis: Decreased range of motion (ROM) of both knees  Muscle weakness (generalized)  Difficulty in walking, not elsewhere classified     Problem List Patient Active Problem List   Diagnosis Date Noted   Normocytic anemia 04/26/2020   Encounter for hospice care discussion    MRSA bacteremia 02/17/2020   Streptococcal bacteremia 02/17/2020   Bacteremia 02/17/2020   History of breast cancer 02/17/2020   Goals of care, counseling/discussion    Palliative care by specialist    DNR (do not resuscitate) discussion    Breast pain, right 01/11/2020   Femoral neck fracture ([place]) 06/27/2019   Femur fracture, left ([place]) 06/26/2019   Right lower lobe pulmonary nodule 05/04/2019   Syncope 09/27/2017   Atypical chest pain 09/03/2017   Essential hypertension 09/03/2017   Short-term memory loss  09/03/2017   Osteoporosis 04/02/2016   Breast cancer, right ([place]) 04/06/2008   Myles Gip PT, DPT [phone]  07/10/2020, 2:33 PM  [facility] [address] [place], [place], [address] Phone: [phone]   Fax:  [phone]  Name: Cynthia Walker MRN: [id number] Date of Birth: 1935-05-13

## 2020-07-12 ENCOUNTER — Ambulatory Visit: Payer: Medicare HMO | Admitting: Physical Therapy

## 2020-07-12 ENCOUNTER — Other Ambulatory Visit: Payer: Self-pay

## 2020-07-12 ENCOUNTER — Encounter: Payer: Self-pay | Admitting: Physical Therapy

## 2020-07-12 DIAGNOSIS — M25661 Stiffness of right knee, not elsewhere classified: Secondary | ICD-10-CM

## 2020-07-12 DIAGNOSIS — R262 Difficulty in walking, not elsewhere classified: Secondary | ICD-10-CM

## 2020-07-12 DIAGNOSIS — M6281 Muscle weakness (generalized): Secondary | ICD-10-CM

## 2020-07-12 NOTE — Therapy (Signed)
[facility] [address] [place], [place], [address] Phone: [phone]   Fax:  [phone]  Physical Therapy Treatment  Patient Details  Name: Cynthia Walker MRN: [id number] Date of Birth: 1934-12-10 Referring Provider (PT): Feldpausch   Encounter Date: 07/12/2020   PT End of Session - 07/12/20 1408    Visit Number 31    Number of Visits 36    Date for PT Re-Evaluation 07/17/20    PT Start Time 1300    PT Stop Time 1345    PT Time Calculation (min) 45 min    Equipment Utilized During Treatment Gait belt    Activity Tolerance Patient tolerated treatment well;Other (comment)   Tx limited 2/2 to attention span   Behavior During Therapy [facility] for tasks assessed/performed   Highly distractable          Past Medical History:  Diagnosis Date   Breast cancer ([place]) 2009   Right breast CA with lumpectomy, radiation and chemo tx's.   Dementia ([place])    Hypertension    Personal history of chemotherapy    Personal history of radiation therapy     Past Surgical History:  Procedure Laterality Date   BREAST BIOPSY Right 03/15/2008   invasive mammary carcinoma   BREAST LUMPECTOMY Right 04/14/2008   Pathology revealed a 1.1 cm grade I invasive ductal carcinoma. Margins were negative.    COLONOSCOPY WITH PROPOFOL N/A 07/10/2015   Procedure: COLONOSCOPY WITH PROPOFOL;  Surgeon: Hulen Luster, MD;  Location: [facility] ENDOSCOPY;  Service: Gastroenterology;  Laterality: N/A;   INTRAMEDULLARY (IM) NAIL INTERTROCHANTERIC Left 06/27/2019   Procedure: INTRAMEDULLARY (IM) NAIL INTERTROCHANTRIC;  Surgeon: Creig Hines, MD;  Location: [facility] ORS;  Service: Orthopedics;  Laterality: Left;   TONSILLECTOMY     age of 85 years old    There were no vitals filed for this visit.   Subjective Assessment - 07/12/20 1405    Subjective Patient arrived to clinic alert and oriented; however, upon entering the PT gym the patient became somnolent.  Several attempts were made to arouse the patient for participation, but she was not able to maintain the level of arousal required for participation in PT. After several attempts and roughly 20 minutes of attempts to arouse, the session was discontinued.    Patient is accompained by: Family member   husband, [name]   Pertinent History Femur fracture, left (CMS-HCC) 06/26/2019; Patient was ambulatory without AD prior to the fall that resulted in fx. Patient's spouse notes that she was capable of walking ~0.5 miles prior to the fall. Patient's spouse reports that she can at present stand for up to 10 min, but not consistently and not without assistance. While working with HHPT patient was able to ambulate with a RW for ~ 200 feet with rest breaks throughout.    Limitations Lifting;Standing;Walking;House hold activities    How long can you sit comfortably? Unlimited    How long can you stand comfortably? 5-10 min with support    How long can you walk comfortably? < 200 feet    Patient Stated Goals 2/2 to cognitive state, patient did not state speicifc goals. Spouse would like to see her ambulatory within the house.    Currently in Pain? No/denies            Several attempts were made to arouse the patient for participation, but she was not able to maintain the level of arousal required for participation in PT. After several attempts and  roughly 20 minutes of attempts to arouse, the session was discontinued.      PT Long Term Goals - 06/19/20 1536      PT LONG TERM GOAL #1   Title Patient will be modified independent with spousal support with HEP for improved strength and balance in order to decrease fall risk and dependence at home and in the community.    Baseline IE: not demonstrated; 6/15: good cargegiver participation and understanding    Time 12    Period Weeks    Status Achieved      PT LONG TERM GOAL #2   Title Patient will demonstrate improved function as evidenced by a score of 44 on  FOTO measure for full participation in activities at home and in the community.    Baseline IE: 16; 6/15: deferred 2/2 to technical issues; 7/27: 38; 9/13: deferred 2/2 to technical issues    Time 4    Period Weeks    Status On-going    Target Date 07/17/20      PT LONG TERM GOAL #3   Title Patient will perform STS with BUE support with Modified IND/SBA with VCs for sequencing and safety in order to participate more fully in basic ADLs.    Baseline IE: Mod/MinA, MAX VCs and TCs for sequencing/safety; 6/15: SBA, BUE // bar support, Min/Mod VCs for sequencing; 7/27: SBA, BUE // bar support, mod encouragement for participation    Time 12    Period Weeks    Status Achieved      PT LONG TERM GOAL #4   Title Patient will demonstrate improved B knee extension ROM as evidenced by lacking < 10 degrees of B knee extension PROM/AROM in order to achieve standing posture with greater independence.    Baseline IE: B lacking 15-20 degrees of knee extension; 6/15: R 0 degrees, L lacking 5 degrees    Time 12    Period Weeks    Status Achieved      PT LONG TERM GOAL #5   Title Patient will demonstrate improved static standing balance as evidenced by ability to maintain standing posture with CGA/SBA, SUE support for 4 minutes in order to participate in basic ADLs.    Baseline IE: 90 sec, MinA, BUE support; 6/15: 2.5 min, CGA, BUE support; 7/27: 4 min, SBA, BUE support; 9/13: 2 min, CGA, SUE support    Time 4    Period Weeks    Status On-going    Target Date 07/17/20      PT LONG TERM GOAL #6   Title Patient will be able to ambulate with RW ~ 25 feet, CGA and VCs for improved ability to participate in ADLs within the home.    Baseline 7/27: // bars 6 feet, SBA, max VCs/encouragement; 9/13: // bars, 18 ft, SBA/CGA, min to no VCs/encouragement    Time 4    Period Weeks    Status On-going    Target Date 07/17/20                  Patient will benefit from skilled therapeutic intervention in  order to improve the following deficits and impairments:     Visit Diagnosis: Decreased range of motion (ROM) of both knees  Muscle weakness (generalized)  Difficulty in walking, not elsewhere classified     Problem List Patient Active Problem List   Diagnosis Date Noted   Normocytic anemia 04/26/2020   Encounter for hospice care discussion    MRSA bacteremia 02/17/2020  Streptococcal bacteremia 02/17/2020   Bacteremia 02/17/2020   History of breast cancer 02/17/2020   Goals of care, counseling/discussion    Palliative care by specialist    DNR (do not resuscitate) discussion    Breast pain, right 01/11/2020   Femoral neck fracture ([place]) 06/27/2019   Femur fracture, left ([place]) 06/26/2019   Right lower lobe pulmonary nodule 05/04/2019   Syncope 09/27/2017   Atypical chest pain 09/03/2017   Essential hypertension 09/03/2017   Short-term memory loss 09/03/2017   Osteoporosis 04/02/2016   Breast cancer, right ([place]) 04/06/2008   Myles Gip PT, DPT [phone]  07/12/2020, 2:09 PM  [facility] [address] [place], [place], [address] Phone: [phone]   Fax:  [phone]  Name: Cynthia Walker MRN: [id number] Date of Birth: Apr 27, 1935

## 2020-07-17 ENCOUNTER — Encounter: Payer: Medicare HMO | Admitting: Physical Therapy

## 2020-07-19 ENCOUNTER — Encounter: Payer: Medicare HMO | Admitting: Physical Therapy

## 2020-07-24 ENCOUNTER — Encounter: Payer: Self-pay | Admitting: Physical Therapy

## 2020-07-24 ENCOUNTER — Ambulatory Visit: Payer: Medicare HMO | Admitting: Physical Therapy

## 2020-07-24 ENCOUNTER — Other Ambulatory Visit: Payer: Self-pay

## 2020-07-24 DIAGNOSIS — M25661 Stiffness of right knee, not elsewhere classified: Secondary | ICD-10-CM | POA: Diagnosis not present

## 2020-07-24 DIAGNOSIS — R262 Difficulty in walking, not elsewhere classified: Secondary | ICD-10-CM

## 2020-07-24 DIAGNOSIS — M6281 Muscle weakness (generalized): Secondary | ICD-10-CM

## 2020-07-24 NOTE — Therapy (Signed)
[place] [address] [place], [place], [address] Phone: [phone]   Fax:  [phone]  Physical Therapy Treatment/Re-certification  Patient Details  Name: Cynthia Walker MRN: [id number] Date of Birth: 1935-06-13 Referring Provider (PT): Feldpausch   Encounter Date: 07/24/2020   PT End of Session - 07/24/20 1229    Visit Number 32    Number of Visits 52    Date for PT Re-Evaluation 09/18/20    PT Start Time 1110    PT Stop Time 1200    PT Time Calculation (min) 50 min    Equipment Utilized During Treatment Gait belt    Activity Tolerance Patient tolerated treatment well;Other (comment)   Tx limited 2/2 to attention span   Behavior During Therapy [facility] for tasks assessed/performed   Highly distractable          Past Medical History:  Diagnosis Date   Breast cancer ([place]) 2009   Right breast CA with lumpectomy, radiation and chemo tx's.   Dementia ([place])    Hypertension    Personal history of chemotherapy    Personal history of radiation therapy     Past Surgical History:  Procedure Laterality Date   BREAST BIOPSY Right 03/15/2008   invasive mammary carcinoma   BREAST LUMPECTOMY Right 04/14/2008   Pathology revealed a 1.1 cm grade I invasive ductal carcinoma. Margins were negative.    COLONOSCOPY WITH PROPOFOL N/A 07/10/2015   Procedure: COLONOSCOPY WITH PROPOFOL;  Surgeon: Hulen Luster, MD;  Location: [facility] ENDOSCOPY;  Service: Gastroenterology;  Laterality: N/A;   INTRAMEDULLARY (IM) NAIL INTERTROCHANTERIC Left 06/27/2019   Procedure: INTRAMEDULLARY (IM) NAIL INTERTROCHANTRIC;  Surgeon: Creig Hines, MD;  Location: [facility] ORS;  Service: Orthopedics;  Laterality: Left;   TONSILLECTOMY     age of 85 years old    There were no vitals filed for this visit.   Subjective Assessment - 07/24/20 1220    Subjective Patient presents alert and oriented to clinic with spouse. Patient quite amenable to participation  in therapy and eager to walk.    Patient is accompained by: Family member   husband, [name]   Pertinent History Femur fracture, left (CMS-HCC) 06/26/2019; Patient was ambulatory without AD prior to the fall that resulted in fx. Patient's spouse notes that she was capable of walking ~0.5 miles prior to the fall. Patient's spouse reports that she can at present stand for up to 10 min, but not consistently and not without assistance. While working with HHPT patient was able to ambulate with a RW for ~ 200 feet with rest breaks throughout.    Limitations Lifting;Standing;Walking;House hold activities    How long can you sit comfortably? Unlimited    How long can you stand comfortably? 5-10 min with support    How long can you walk comfortably? < 200 feet    Patient Stated Goals 2/2 to cognitive state, patient did not state speicifc goals. Spouse would like to see her ambulatory within the house.    Currently in Pain? No/denies           TREATMENT  Neuromuscular Re-education: // bars: STS, BUE support, SBA, x5, with min encouragment and reorientation to task Gait with BUE support, SBA/CGA, x4, min VCs for sequencing and orientation to task. Patient with good sequencing and reciprocal UE movement.    Patient educated throughout session on appropriate technique and form using multi-modal cueing, HEP, and activity modification. Patient articulated understanding and returned demonstration.  Patient Response to  interventions: Patient states she is tired.  ASSESSMENT Patient presents to clinic alert and willing to participate in therapy. Patient demonstrates deficits in cognition, posture, BLE strength, gait, balance, and function. Patient ambulating in // bars with noted improvements in symmetry of stride length and reciprocal arm movement absent of cueing. Patient does benefit from consistent concise cueing during walking for participation and motivation with verbal cueing best accepted from  posterior to the patient (spouse's voice quite useful and well-received for encouragement). While progress toward goals is not rapid, the patient has demonstrated improvement to standing tolerance with direct translation to improved safety and participation in ADLs/personal hygiene in the home. Patient's condition has the potential to continue to improve in response to therapy. Maximum improvement is yet to be obtained. The anticipated improvement is attainable and reasonable in a generally predictable time. Patient will benefit from continued skilled therapeutic intervention to address deficits in cognition, posture, BLE strength, gait, balance, and function in order to increase function and improve overall QOL.     PT Long Term Goals - 07/24/20 1231      PT LONG TERM GOAL #1   Title Patient will be modified independent with spousal support with HEP for improved strength and balance in order to decrease fall risk and dependence at home and in the community.    Baseline IE: not demonstrated; 6/15: good cargegiver participation and understanding    Time 12    Period Weeks    Status Achieved      PT LONG TERM GOAL #2   Title Patient will demonstrate improved function as evidenced by a score of 44 on FOTO measure for full participation in activities at home and in the community.    Baseline IE: 16; 6/15: deferred 2/2 to technical issues; 7/27: 38; 9/13: deferred 2/2 to technical issues; 10/18: to be collected next visit    Time 8    Period Weeks    Status On-going    Target Date 09/18/20      PT LONG TERM GOAL #3   Title Patient will perform STS with BUE support with Modified IND/SBA with VCs for sequencing and safety in order to participate more fully in basic ADLs.    Baseline IE: Mod/MinA, MAX VCs and TCs for sequencing/safety; 6/15: SBA, BUE // bar support, Min/Mod VCs for sequencing; 7/27: SBA, BUE // bar support, mod encouragement for participation    Time 12    Period Weeks    Status  Achieved      PT LONG TERM GOAL #4   Title Patient will demonstrate improved B knee extension ROM as evidenced by lacking < 10 degrees of B knee extension PROM/AROM in order to achieve standing posture with greater independence.    Baseline IE: B lacking 15-20 degrees of knee extension; 6/15: R 0 degrees, L lacking 5 degrees    Time 12    Period Weeks    Status Achieved      PT LONG TERM GOAL #5   Title Patient will demonstrate improved static standing balance as evidenced by ability to maintain standing posture with CGA/SBA, SUE support for 4 minutes in order to participate in basic ADLs.    Baseline IE: 90 sec, MinA, BUE support; 6/15: 2.5 min, CGA, BUE support; 7/27: 4 min, SBA, BUE support; 9/13: 2 min, CGA, SUE support; 10/18: 15 min during ADLs, BUE support, CGA; in clinic 4 min SUE with occasional BUE support, CGA    Time 8    Period  Weeks    Status On-going    Target Date 09/18/20      PT LONG TERM GOAL #6   Title Patient will be able to ambulate with RW ~ 25 feet, CGA and VCs for improved ability to participate in ADLs within the home.    Baseline 7/27: // bars 6 feet, SBA, max VCs/encouragement; 9/13: // bars, 18 ft, SBA/CGA, min to no VCs/encouragement; 10/18: // bars, 25 ft, SBA/CGA, min VCs    Time 8    Period Weeks    Status On-going    Target Date 09/18/20                 Plan - 07/24/20 1230    Clinical Impression Statement Patient presents to clinic alert and willing to participate in therapy. Patient demonstrates deficits in cognition, posture, BLE strength, gait, balance, and function. Patient ambulating in // bars with noted improvements in symmetry of stride length and reciprocal arm movement absent of cueing. Patient does benefit from consistent concise cueing during walking for participation and motivation with verbal cueing best accepted from posterior to the patient (spouse's voice quite useful and well-received for encouragement). While progress toward  goals is not rapid, the patient has demonstrated improvement to standing tolerance with direct translation to improved safety and participation in ADLs/personal hygiene in the home. Patient's condition has the potential to continue to improve in response to therapy. Maximum improvement is yet to be obtained. The anticipated improvement is attainable and reasonable in a generally predictable time. Patient will benefit from continued skilled therapeutic intervention to address deficits in cognition, posture, BLE strength, gait, balance, and function in order to increase function and improve overall QOL.    Personal Factors and Comorbidities Age;Education;Comorbidity 3+;Past/Current Experience;Time since onset of injury/illness/exacerbation;Fitness;Transportation;Other   Cognitive State   Comorbidities anemia, breast cancer, chronic constipation, diverticulosis, hypertension, hyperlipidemia, osteoporosis    Examination-Activity Limitations Bathing;Dressing;Transfers;Hygiene/Grooming;Bed Mobility;Squat;Lift;Bend;Locomotion Level;Stairs;Reach Overhead;Stand;Toileting;Carry    Examination-Participation Restrictions Church;Interpersonal Relationship;Personal Finances;Yard Work;Cleaning;Laundry;Community Activity;Medication Management;Shop;Driving;Meal Prep    Stability/Clinical Decision Making Evolving/Moderate complexity    Rehab Potential Fair    PT Frequency 2x / week    PT Duration 8 weeks    PT Treatment/Interventions Moist Heat;DME Instruction;Therapeutic activities;Functional mobility training;Stair training;Gait training;Therapeutic exercise;Balance training;Neuromuscular re-education;Cognitive remediation;Orthotic Fit/Training;Patient/family education;Manual techniques;Scar mobilization;Passive range of motion;Energy conservation;Taping    PT Next Visit Plan // bar gait    PT Home Exercise Plan hamstring stretch; supported standing for ADLs    Consulted and Agree with Plan of Care Patient;Family  member/caregiver    Family Member Consulted Yuriko Portales, husband           Patient will benefit from skilled therapeutic intervention in order to improve the following deficits and impairments:  Abnormal gait, Decreased balance, Decreased endurance, Decreased mobility, Difficulty walking, Hypomobility, Postural dysfunction, Improper body mechanics, Increased edema, Decreased range of motion, Decreased cognition, Decreased activity tolerance, Decreased coordination, Decreased safety awareness, Decreased strength, Impaired flexibility  Visit Diagnosis: Decreased range of motion (ROM) of both knees  Muscle weakness (generalized)  Difficulty in walking, not elsewhere classified     Problem List Patient Active Problem List   Diagnosis Date Noted   Normocytic anemia 04/26/2020   Encounter for hospice care discussion    MRSA bacteremia 02/17/2020   Streptococcal bacteremia 02/17/2020   Bacteremia 02/17/2020   History of breast cancer 02/17/2020   Goals of care, counseling/discussion    Palliative care by specialist    DNR (do not resuscitate) discussion    Breast  pain, right 01/11/2020   Femoral neck fracture ([place]) 06/27/2019   Femur fracture, left ([place]) 06/26/2019   Right lower lobe pulmonary nodule 05/04/2019   Syncope 09/27/2017   Atypical chest pain 09/03/2017   Essential hypertension 09/03/2017   Short-term memory loss 09/03/2017   Osteoporosis 04/02/2016   Breast cancer, right ([place]) 04/06/2008   Myles Gip PT, DPT [phone]  07/24/2020, 12:42 PM  [facility] [address] [place], [place], [address] Phone: [phone]   Fax:  [phone]  Name: Cynthia Walker MRN: [id number] Date of Birth: 12-05-1934

## 2020-07-27 ENCOUNTER — Encounter: Payer: Self-pay | Admitting: Physical Therapy

## 2020-07-27 ENCOUNTER — Other Ambulatory Visit: Payer: Self-pay

## 2020-07-27 ENCOUNTER — Ambulatory Visit: Payer: Medicare HMO | Admitting: Physical Therapy

## 2020-07-27 DIAGNOSIS — M25661 Stiffness of right knee, not elsewhere classified: Secondary | ICD-10-CM | POA: Diagnosis not present

## 2020-07-27 DIAGNOSIS — M6281 Muscle weakness (generalized): Secondary | ICD-10-CM

## 2020-07-27 DIAGNOSIS — R262 Difficulty in walking, not elsewhere classified: Secondary | ICD-10-CM

## 2020-07-27 NOTE — Therapy (Signed)
[place] [facility] [address] [place], [place], [address] Phone: [phone]   Fax:  [phone]  Physical Therapy Treatment  Patient Details  Name: Cynthia Walker MRN: [id number] Date of Birth: [birth year] Referring Provider (PT): Feldpausch   Encounter Date: 07/27/2020   PT End of Session - 07/27/20 1358    Visit Number 33    Number of Visits 52    Date for PT Re-Evaluation 09/18/20    PT Start Time 1300    PT Stop Time 1335    PT Time Calculation (min) 35 min    Equipment Utilized During Treatment Gait belt    Activity Tolerance Other (comment);Patient limited by fatigue   Tx limited 2/2 to attention span and emotional lability   Behavior During Therapy [facility] for tasks assessed/performed   Highly distractable          Past Medical History:  Diagnosis Date   Breast cancer [facility]) 2009   Right breast CA with lumpectomy, radiation and chemo tx's.   Dementia ([place])    Hypertension    Personal history of chemotherapy    Personal history of radiation therapy     Past Surgical History:  Procedure Laterality Date   BREAST BIOPSY Right 03/15/2008   invasive mammary carcinoma   BREAST LUMPECTOMY Right 04/14/2008   Pathology revealed a 1.1 cm grade I invasive ductal carcinoma. Margins were negative.    COLONOSCOPY WITH PROPOFOL N/A 07/10/2015   Procedure: COLONOSCOPY WITH PROPOFOL;  Surgeon: Hulen Luster, MD;  Location: [facility];  Service: Gastroenterology;  Laterality: N/A;   INTRAMEDULLARY (IM) NAIL INTERTROCHANTERIC Left 06/27/2019   Procedure: INTRAMEDULLARY (IM) NAIL INTERTROCHANTRIC;  Surgeon: Creig Hines, MD;  Location: [facility] ORS;  Service: Orthopedics;  Laterality: Left;   TONSILLECTOMY     age of 85 years old    There were no vitals filed for this visit.   Subjective Assessment - 07/27/20 1356    Subjective Patient arrives to clinic alert and oriented but not particularly excited to practice gait. Spouse  notes that use of hoyer lift at home has made bed<>w/c transfers much easier on both patient and spouse. Spouse also notes patient continues to be able to stand with grab bar in bathroom for longer amounts of time with improving postural control.    Patient is accompained by: Family member   husband, Cynthia Walker   Pertinent History Femur fracture, left (CMS-HCC) 06/26/2019; Patient was ambulatory without AD prior to the fall that resulted in fx. Patient's spouse notes that she was capable of walking ~0.5 miles prior to the fall. Patient's spouse reports that she can at present stand for up to 10 min, but not consistently and not without assistance. While working with HHPT patient was able to ambulate with a RW for ~ 200 feet with rest breaks throughout.    Limitations Lifting;Standing;Walking;House hold activities    How long can you sit comfortably? Unlimited    How long can you stand comfortably? 5-10 min with support    How long can you walk comfortably? < 200 feet    Patient Stated Goals 2/2 to cognitive state, patient did not state speicifc goals. Spouse would like to see her ambulatory within the house.    Currently in Pain? No/denies           TREATMENT  Neuromuscular Re-education: // bars: STS, BUE support, SBA, x5, with min encouragment and reorientation to task Gait with BUE support, SBA/CGA, x1, min VCs for sequencing  and orientation to task. Patient with good sequencing and reciprocal UE movement.    Patient educated throughout session on appropriate technique and form using multi-modal cueing, HEP, and activity modification. Patient articulated understanding and returned demonstration.  Patient Response to interventions: Patient became emotional labile after gait and had difficulty regulating emotions. She remained alert and oriented and denied being in pain, but spouse, patient, and DPT all agreed to discontinue session.  ASSESSMENT Patient presents to clinic alert and willing to  participate in therapy. Patient demonstrates deficits in cognition, posture, BLE strength, gait, balance, and function. Patient's participation limited today 2/2 to emotional lability; on a positive note, patient remained oriented and alert throughout the brief session. Patient will benefit from continued skilled therapeutic intervention to address deficits in cognition, posture, BLE strength, gait, balance, and function in order to increase function and improve overall QOL.      PT Long Term Goals - 07/24/20 1231      PT LONG TERM GOAL #1   Title Patient will be modified independent with spousal support with HEP for improved strength and balance in order to decrease fall risk and dependence at home and in the community.    Baseline IE: not demonstrated; 6/15: good cargegiver participation and understanding    Time 12    Period Weeks    Status Achieved      PT LONG TERM GOAL #2   Title Patient will demonstrate improved function as evidenced by a score of 44 on FOTO measure for full participation in activities at home and in the community.    Baseline IE: 16; 6/15: deferred 2/2 to technical issues; 7/27: 38; 9/13: deferred 2/2 to technical issues; 10/18: to be collected next visit    Time 8    Period Weeks    Status On-going    Target Date 09/18/20      PT LONG TERM GOAL #3   Title Patient will perform STS with BUE support with Modified IND/SBA with VCs for sequencing and safety in order to participate more fully in basic ADLs.    Baseline IE: Mod/MinA, MAX VCs and TCs for sequencing/safety; 6/15: SBA, BUE // bar support, Min/Mod VCs for sequencing; 7/27: SBA, BUE // bar support, mod encouragement for participation    Time 12    Period Weeks    Status Achieved      PT LONG TERM GOAL #4   Title Patient will demonstrate improved B knee extension ROM as evidenced by lacking < 10 degrees of B knee extension PROM/AROM in order to achieve standing posture with greater independence.     Baseline IE: B lacking 15-20 degrees of knee extension; 6/15: R 0 degrees, L lacking 5 degrees    Time 12    Period Weeks    Status Achieved      PT LONG TERM GOAL #5   Title Patient will demonstrate improved static standing balance as evidenced by ability to maintain standing posture with CGA/SBA, SUE support for 4 minutes in order to participate in basic ADLs.    Baseline IE: 90 sec, MinA, BUE support; 6/15: 2.5 min, CGA, BUE support; 7/27: 4 min, SBA, BUE support; 9/13: 2 min, CGA, SUE support; 10/18: 15 min during ADLs, BUE support, CGA; in clinic 4 min SUE with occasional BUE support, CGA    Time 8    Period Weeks    Status On-going    Target Date 09/18/20      PT LONG TERM GOAL #6  Title Patient will be able to ambulate with RW ~ 25 feet, CGA and VCs for improved ability to participate in ADLs within the home.    Baseline 7/27: // bars 6 feet, SBA, max VCs/encouragement; 9/13: // bars, 18 ft, SBA/CGA, min to no VCs/encouragement; 10/18: // bars, 25 ft, SBA/CGA, min VCs    Time 8    Period Weeks    Status On-going    Target Date 09/18/20                 Plan - 07/27/20 1359    Clinical Impression Statement Patient presents to clinic alert and willing to participate in therapy. Patient demonstrates deficits in cognition, posture, BLE strength, gait, balance, and function. Patient's participation limited today 2/2 to emotional lability; on a positive note, patient remained oriented and alert throughout the brief session. Patient will benefit from continued skilled therapeutic intervention to address deficits in cognition, posture, BLE strength, gait, balance, and function in order to increase function and improve overall QOL.    Personal Factors and Comorbidities Age;Education;Comorbidity 3+;Past/Current Experience;Time since onset of injury/illness/exacerbation;Fitness;Transportation;Other   Cognitive State   Comorbidities anemia, breast cancer, chronic constipation,  diverticulosis, hypertension, hyperlipidemia, osteoporosis    Examination-Activity Limitations Bathing;Dressing;Transfers;Hygiene/Grooming;Bed Mobility;Squat;Lift;Bend;Locomotion Level;Stairs;Reach Overhead;Stand;Toileting;Carry    Examination-Participation Restrictions Church;Interpersonal Relationship;Personal Finances;Yard Work;Cleaning;Laundry;Community Activity;Medication Management;Shop;Driving;Meal Prep    Stability/Clinical Decision Making Evolving/Moderate complexity    Rehab Potential Fair    PT Frequency 2x / week    PT Duration 8 weeks    PT Treatment/Interventions Moist Heat;DME Instruction;Therapeutic activities;Functional mobility training;Stair training;Gait training;Therapeutic exercise;Balance training;Neuromuscular re-education;Cognitive remediation;Orthotic Fit/Training;Patient/family education;Manual techniques;Scar mobilization;Passive range of motion;Energy conservation;Taping    PT Next Visit Plan // bar gait    PT Home Exercise Plan hamstring stretch; supported standing for ADLs    Consulted and Agree with Plan of Care Patient;Family member/caregiver    Family Member Consulted Cynthia Walker, husband           Patient will benefit from skilled therapeutic intervention in order to improve the following deficits and impairments:  Abnormal gait, Decreased balance, Decreased endurance, Decreased mobility, Difficulty walking, Hypomobility, Postural dysfunction, Improper body mechanics, Increased edema, Decreased range of motion, Decreased cognition, Decreased activity tolerance, Decreased coordination, Decreased safety awareness, Decreased strength, Impaired flexibility  Visit Diagnosis: Decreased range of motion (ROM) of both knees  Muscle weakness (generalized)  Difficulty in walking, not elsewhere classified     Problem List Patient Active Problem List   Diagnosis Date Noted   Normocytic anemia 04/26/2020   Encounter for hospice care discussion    MRSA  bacteremia 02/17/2020   Streptococcal bacteremia 02/17/2020   Bacteremia 02/17/2020   History of breast cancer 02/17/2020   Goals of care, counseling/discussion    Palliative care by specialist    DNR (do not resuscitate) discussion    Breast pain, right 01/11/2020   Femoral neck fracture ([place]) 06/27/2019   Femur fracture, left ([place]) 06/26/2019   Right lower lobe pulmonary nodule 05/04/2019   Syncope 09/27/2017   Atypical chest pain 09/03/2017   Essential hypertension 09/03/2017   Short-term memory loss 09/03/2017   Osteoporosis 04/02/2016   Breast cancer, right ([place]) 04/06/2008   Myles Gip PT, DPT [phone]  07/27/2020, 5:50 PM  [place] [facility] [address] [place], [place], [address] Phone: [phone]   Fax:  [phone]  Name: Cynthia Walker MRN: [id number] Date of Birth: [birth year]

## 2020-07-31 ENCOUNTER — Ambulatory Visit: Payer: Medicare HMO | Admitting: Physical Therapy

## 2020-07-31 ENCOUNTER — Encounter: Payer: Self-pay | Admitting: Physical Therapy

## 2020-07-31 ENCOUNTER — Other Ambulatory Visit: Payer: Self-pay

## 2020-07-31 DIAGNOSIS — M25662 Stiffness of left knee, not elsewhere classified: Secondary | ICD-10-CM

## 2020-07-31 DIAGNOSIS — M25661 Stiffness of right knee, not elsewhere classified: Secondary | ICD-10-CM

## 2020-07-31 DIAGNOSIS — R262 Difficulty in walking, not elsewhere classified: Secondary | ICD-10-CM

## 2020-07-31 DIAGNOSIS — M6281 Muscle weakness (generalized): Secondary | ICD-10-CM

## 2020-07-31 NOTE — Therapy (Signed)
[facility] [address] [place], [place], [address] Phone: [phone]   Fax:  [phone]  Physical Therapy Treatment  Patient Details  Name: Cynthia Walker MRN: [id number] Date of Birth: 02/11/1935 Referring Provider (PT): Feldpausch   Encounter Date: 07/31/2020   PT End of Session - 07/31/20 1216    Visit Number 34    Number of Visits 52    Date for PT Re-Evaluation 09/18/20    PT Start Time 1102    PT Stop Time 1200    PT Time Calculation (min) 58 min    Equipment Utilized During Treatment Gait belt    Activity Tolerance Other (comment);Patient limited by fatigue   Tx limited 2/2 to attention span and emotional lability   Behavior During Therapy [facility] for tasks assessed/performed   Highly distractable          Past Medical History:  Diagnosis Date  . Breast cancer [facility]) 2009   Right breast CA with lumpectomy, radiation and chemo tx's.  . Dementia ([place])   . Hypertension   . Personal history of chemotherapy   . Personal history of radiation therapy     Past Surgical History:  Procedure Laterality Date  . BREAST BIOPSY Right 03/15/2008   invasive mammary carcinoma  . BREAST LUMPECTOMY Right 04/14/2008   Pathology revealed a 1.1 cm grade I invasive ductal carcinoma. Margins were negative.   . COLONOSCOPY WITH PROPOFOL N/A 07/10/2015   Procedure: COLONOSCOPY WITH PROPOFOL;  Surgeon: Hulen Luster, MD;  Location: [facility] ENDOSCOPY;  Service: Gastroenterology;  Laterality: N/A;  . INTRAMEDULLARY (IM) NAIL INTERTROCHANTERIC Left 06/27/2019   Procedure: INTRAMEDULLARY (IM) NAIL INTERTROCHANTRIC;  Surgeon: Creig Hines, MD;  Location: [facility] ORS;  Service: Orthopedics;  Laterality: Left;  . TONSILLECTOMY     age of 85 years old    There were no vitals filed for this visit.   Subjective Assessment - 07/31/20 1215    Subjective Patient presents to clinic alert and oriented, but notes she doesn't want to work on walking.  Spouse indicates new onset of constipation with decreased appetite and decreased fluid intake. Spouse does note that patient has been able to stand for 20-30 min for hygiene with the support of a grab bar and belt.    Patient is accompained by: Family member   husband, [name]   Pertinent History Femur fracture, left (CMS-HCC) 06/26/2019; Patient was ambulatory without AD prior to the fall that resulted in fx. Patient's spouse notes that she was capable of walking ~0.5 miles prior to the fall. Patient's spouse reports that she can at present stand for up to 10 min, but not consistently and not without assistance. While working with HHPT patient was able to ambulate with a RW for ~ 200 feet with rest breaks throughout.    Limitations Lifting;Standing;Walking;House hold activities    How long can you sit comfortably? Unlimited    How long can you stand comfortably? 5-10 min with support    How long can you walk comfortably? < 200 feet    Patient Stated Goals 2/2 to cognitive state, patient did not state speicifc goals. Spouse would like to see her ambulatory within the house.    Currently in Pain? No/denies           TREATMENT  Neuromuscular Re-education: // bars: STS, BUE support, SBA, x10, 2x5, with min encouragment and reorientation to task Gait with BUE support, SBA/CGA, x2, min VCs for sequencing and orientation to task.  Patient with good sequencing and reciprocal UE movement. Patient and caregiver education on bowel massage and importance of fluid intake for gut motility and ease of BMs.   Patient educated throughout session on appropriate technique and form using multi-modal cueing, HEP, and activity modification. Patient articulated understanding and returned demonstration.  Patient Response to interventions: Patient states she is ready to go  ASSESSMENT Patient presents to clinic alert and willing to participate in therapy. Patient demonstrates deficits in cognition, posture, BLE  strength, gait, balance, and function. Patient moderately distracted throughout the session, but still sequencing gait BUE/BLE with minimal cueing during today's session. Patient will benefit from continued skilled therapeutic intervention to address deficits in cognition, posture, BLE strength, gait, balance, and function in order to increase function and improve overall QOL.     PT Long Term Goals - 07/24/20 1231      PT LONG TERM GOAL #1   Title Patient will be modified independent with spousal support with HEP for improved strength and balance in order to decrease fall risk and dependence at home and in the community.    Baseline IE: not demonstrated; 6/15: good cargegiver participation and understanding    Time 12    Period Weeks    Status Achieved      PT LONG TERM GOAL #2   Title Patient will demonstrate improved function as evidenced by a score of 44 on FOTO measure for full participation in activities at home and in the community.    Baseline IE: 16; 6/15: deferred 2/2 to technical issues; 7/27: 38; 9/13: deferred 2/2 to technical issues; 10/18: to be collected next visit    Time 8    Period Weeks    Status On-going    Target Date 09/18/20      PT LONG TERM GOAL #3   Title Patient will perform STS with BUE support with Modified IND/SBA with VCs for sequencing and safety in order to participate more fully in basic ADLs.    Baseline IE: Mod/MinA, MAX VCs and TCs for sequencing/safety; 6/15: SBA, BUE // bar support, Min/Mod VCs for sequencing; 7/27: SBA, BUE // bar support, mod encouragement for participation    Time 12    Period Weeks    Status Achieved      PT LONG TERM GOAL #4   Title Patient will demonstrate improved B knee extension ROM as evidenced by lacking < 10 degrees of B knee extension PROM/AROM in order to achieve standing posture with greater independence.    Baseline IE: B lacking 15-20 degrees of knee extension; 6/15: R 0 degrees, L lacking 5 degrees    Time 12      Period Weeks    Status Achieved      PT LONG TERM GOAL #5   Title Patient will demonstrate improved static standing balance as evidenced by ability to maintain standing posture with CGA/SBA, SUE support for 4 minutes in order to participate in basic ADLs.    Baseline IE: 90 sec, MinA, BUE support; 6/15: 2.5 min, CGA, BUE support; 7/27: 4 min, SBA, BUE support; 9/13: 2 min, CGA, SUE support; 10/18: 15 min during ADLs, BUE support, CGA; in clinic 4 min SUE with occasional BUE support, CGA    Time 8    Period Weeks    Status On-going    Target Date 09/18/20      PT LONG TERM GOAL #6   Title Patient will be able to ambulate with RW ~ 25 feet, CGA  and VCs for improved ability to participate in ADLs within the home.    Baseline 7/27: // bars 6 feet, SBA, max VCs/encouragement; 9/13: // bars, 18 ft, SBA/CGA, min to no VCs/encouragement; 10/18: // bars, 25 ft, SBA/CGA, min VCs    Time 8    Period Weeks    Status On-going    Target Date 09/18/20                 Plan - 07/31/20 1217    Clinical Impression Statement Patient presents to clinic alert and willing to participate in therapy. Patient demonstrates deficits in cognition, posture, BLE strength, gait, balance, and function. Patient moderately distracted throughout the session, but still sequencing gait BUE/BLE with minimal cueing during today's session. Patient will benefit from continued skilled therapeutic intervention to address deficits in cognition, posture, BLE strength, gait, balance, and function in order to increase function and improve overall QOL.    Personal Factors and Comorbidities Age;Education;Comorbidity 3+;Past/Current Experience;Time since onset of injury/illness/exacerbation;Fitness;Transportation;Other   Cognitive State   Comorbidities anemia, breast cancer, chronic constipation, diverticulosis, hypertension, hyperlipidemia, osteoporosis    Examination-Activity Limitations  Bathing;Dressing;Transfers;Hygiene/Grooming;Bed Mobility;Squat;Lift;Bend;Locomotion Level;Stairs;Reach Overhead;Stand;Toileting;Carry    Examination-Participation Restrictions Church;Interpersonal Relationship;Personal Finances;Yard Work;Cleaning;Laundry;Community Activity;Medication Management;Shop;Driving;Meal Prep    Stability/Clinical Decision Making Evolving/Moderate complexity    Rehab Potential Fair    PT Frequency 2x / week    PT Duration 8 weeks    PT Treatment/Interventions Moist Heat;DME Instruction;Therapeutic activities;Functional mobility training;Stair training;Gait training;Therapeutic exercise;Balance training;Neuromuscular re-education;Cognitive remediation;Orthotic Fit/Training;Patient/family education;Manual techniques;Scar mobilization;Passive range of motion;Energy conservation;Taping    PT Next Visit Plan // bar gait    PT Home Exercise Plan hamstring stretch; supported standing for ADLs    Consulted and Agree with Plan of Care Patient;Family member/caregiver    Family Member Consulted Jada Fass, husband           Patient will benefit from skilled therapeutic intervention in order to improve the following deficits and impairments:  Abnormal gait, Decreased balance, Decreased endurance, Decreased mobility, Difficulty walking, Hypomobility, Postural dysfunction, Improper body mechanics, Increased edema, Decreased range of motion, Decreased cognition, Decreased activity tolerance, Decreased coordination, Decreased safety awareness, Decreased strength, Impaired flexibility  Visit Diagnosis: Decreased range of motion (ROM) of both knees  Muscle weakness (generalized)  Difficulty in walking, not elsewhere classified     Problem List Patient Active Problem List   Diagnosis Date Noted  . Normocytic anemia 04/26/2020  . Encounter for hospice care discussion   . MRSA bacteremia 02/17/2020  . Streptococcal bacteremia 02/17/2020  . Bacteremia 02/17/2020  . History  of breast cancer 02/17/2020  . Goals of care, counseling/discussion   . Palliative care by specialist   . DNR (do not resuscitate) discussion   . Breast pain, right 01/11/2020  . Femoral neck fracture ([place]) 06/27/2019  . Femur fracture, left ([place]) 06/26/2019  . Right lower lobe pulmonary nodule 05/04/2019  . Syncope 09/27/2017  . Atypical chest pain 09/03/2017  . Essential hypertension 09/03/2017  . Short-term memory loss 09/03/2017  . Osteoporosis 04/02/2016  . Breast cancer, right [facility]) 04/06/2008   Myles Gip PT, DPT [phone] 07/31/2020, 12:22 PM  [facility] [address] [place], [place], [address] Phone: [phone]   Fax:  [phone]  Name: Cynthia Walker MRN: [id number] Date of Birth: Feb 06, 1935

## 2020-08-02 ENCOUNTER — Other Ambulatory Visit: Payer: Self-pay

## 2020-08-02 ENCOUNTER — Encounter: Payer: Self-pay | Admitting: Physical Therapy

## 2020-08-02 ENCOUNTER — Ambulatory Visit: Payer: Medicare HMO | Admitting: Physical Therapy

## 2020-08-02 DIAGNOSIS — M25661 Stiffness of right knee, not elsewhere classified: Secondary | ICD-10-CM

## 2020-08-02 DIAGNOSIS — M6281 Muscle weakness (generalized): Secondary | ICD-10-CM

## 2020-08-02 DIAGNOSIS — R262 Difficulty in walking, not elsewhere classified: Secondary | ICD-10-CM

## 2020-08-02 NOTE — Therapy (Signed)
[facility] [address] [place], [place], [address] Phone: [phone]   Fax:  [phone]  Physical Therapy Treatment  Patient Details  Name: Cynthia Walker MRN: [id number] Date of Birth: 10-07-1935 Referring Provider (PT): Feldpausch   Encounter Date: 08/02/2020   PT End of Session - 08/02/20 1359    Visit Number 35    Number of Visits 52    Date for PT Re-Evaluation 09/18/20    PT Start Time 1250    PT Stop Time 1345    PT Time Calculation (min) 55 min    Equipment Utilized During Treatment Gait belt    Activity Tolerance Other (comment);Patient limited by fatigue   Tx limited 2/2 to attention span and emotional lability   Behavior During Therapy [facility] for tasks assessed/performed   Highly distractable          Past Medical History:  Diagnosis Date   Breast cancer [facility]) 2009   Right breast CA with lumpectomy, radiation and chemo tx's.   Dementia ([place])    Hypertension    Personal history of chemotherapy    Personal history of radiation therapy     Past Surgical History:  Procedure Laterality Date   BREAST BIOPSY Right 03/15/2008   invasive mammary carcinoma   BREAST LUMPECTOMY Right 04/14/2008   Pathology revealed a 1.1 cm grade I invasive ductal carcinoma. Margins were negative.    COLONOSCOPY WITH PROPOFOL N/A 07/10/2015   Procedure: COLONOSCOPY WITH PROPOFOL;  Surgeon: Hulen Luster, MD;  Location: [facility] ENDOSCOPY;  Service: Gastroenterology;  Laterality: N/A;   INTRAMEDULLARY (IM) NAIL INTERTROCHANTERIC Left 06/27/2019   Procedure: INTRAMEDULLARY (IM) NAIL INTERTROCHANTRIC;  Surgeon: Creig Hines, MD;  Location: [facility] ORS;  Service: Orthopedics;  Laterality: Left;   TONSILLECTOMY     age of 85 years old    There were no vitals filed for this visit.   Subjective Assessment - 08/02/20 1355    Subjective Patient presents to clinic a bit drowsy and not quite interested in participating in therapy.  Spouse notes that she has been performing 3-5 STS every morning and night for hygiene.    Patient is accompained by: Family member   husband, [name]   Pertinent History Femur fracture, left (CMS-HCC) 06/26/2019; Patient was ambulatory without AD prior to the fall that resulted in fx. Patient's spouse notes that she was capable of walking ~0.5 miles prior to the fall. Patient's spouse reports that she can at present stand for up to 10 min, but not consistently and not without assistance. While working with HHPT patient was able to ambulate with a RW for ~ 200 feet with rest breaks throughout.    Limitations Lifting;Standing;Walking;House hold activities    How long can you sit comfortably? Unlimited    How long can you stand comfortably? 5-10 min with support    How long can you walk comfortably? < 200 feet    Patient Stated Goals 2/2 to cognitive state, patient did not state speicifc goals. Spouse would like to see her ambulatory within the house.    Currently in Pain? No/denies           TREATMENT  Neuromuscular Re-education: // bars: STS, BUE support, SBA, x13, 2x5, with min encouragment and reorientation to task Gait with BUE support, SBA/CGA, x2, min VCs for sequencing and orientation to task. Patient with good sequencing and reciprocal UE movement.   Patient educated throughout session on appropriate technique and form using multi-modal cueing, HEP,  and activity modification. Patient articulated understanding and returned demonstration.  Patient Response to interventions: Patient states she is done   ASSESSMENT Patient presents to clinic alert and willing to participate in therapy. Patient demonstrates deficits in cognition, posture, BLE strength, gait, balance, and function. Patient participating with good effort despite initial droswiness during today's session and responded well to active interventions. Patient will benefit from continued skilled therapeutic intervention to address  deficits in cognition, posture, BLE strength, gait, balance, and function in order to increase function and improve overall QOL.      PT Long Term Goals - 07/24/20 1231      PT LONG TERM GOAL #1   Title Patient will be modified independent with spousal support with HEP for improved strength and balance in order to decrease fall risk and dependence at home and in the community.    Baseline IE: not demonstrated; 6/15: good cargegiver participation and understanding    Time 12    Period Weeks    Status Achieved      PT LONG TERM GOAL #2   Title Patient will demonstrate improved function as evidenced by a score of 44 on FOTO measure for full participation in activities at home and in the community.    Baseline IE: 16; 6/15: deferred 2/2 to technical issues; 7/27: 38; 9/13: deferred 2/2 to technical issues; 10/18: to be collected next visit    Time 8    Period Weeks    Status On-going    Target Date 09/18/20      PT LONG TERM GOAL #3   Title Patient will perform STS with BUE support with Modified IND/SBA with VCs for sequencing and safety in order to participate more fully in basic ADLs.    Baseline IE: Mod/MinA, MAX VCs and TCs for sequencing/safety; 6/15: SBA, BUE // bar support, Min/Mod VCs for sequencing; 7/27: SBA, BUE // bar support, mod encouragement for participation    Time 12    Period Weeks    Status Achieved      PT LONG TERM GOAL #4   Title Patient will demonstrate improved B knee extension ROM as evidenced by lacking < 10 degrees of B knee extension PROM/AROM in order to achieve standing posture with greater independence.    Baseline IE: B lacking 15-20 degrees of knee extension; 6/15: R 0 degrees, L lacking 5 degrees    Time 12    Period Weeks    Status Achieved      PT LONG TERM GOAL #5   Title Patient will demonstrate improved static standing balance as evidenced by ability to maintain standing posture with CGA/SBA, SUE support for 4 minutes in order to participate in  basic ADLs.    Baseline IE: 90 sec, MinA, BUE support; 6/15: 2.5 min, CGA, BUE support; 7/27: 4 min, SBA, BUE support; 9/13: 2 min, CGA, SUE support; 10/18: 15 min during ADLs, BUE support, CGA; in clinic 4 min SUE with occasional BUE support, CGA    Time 8    Period Weeks    Status On-going    Target Date 09/18/20      PT LONG TERM GOAL #6   Title Patient will be able to ambulate with RW ~ 25 feet, CGA and VCs for improved ability to participate in ADLs within the home.    Baseline 7/27: // bars 6 feet, SBA, max VCs/encouragement; 9/13: // bars, 18 ft, SBA/CGA, min to no VCs/encouragement; 10/18: // bars, 25 ft, SBA/CGA, min VCs  Time 8    Period Weeks    Status On-going    Target Date 09/18/20                 Plan - 08/02/20 1359    Clinical Impression Statement Patient presents to clinic alert and willing to participate in therapy. Patient demonstrates deficits in cognition, posture, BLE strength, gait, balance, and function. Patient participating with good effort despite initial droswiness during today's session and responded well to active interventions. Patient will benefit from continued skilled therapeutic intervention to address deficits in cognition, posture, BLE strength, gait, balance, and function in order to increase function and improve overall QOL.    Personal Factors and Comorbidities Age;Education;Comorbidity 3+;Past/Current Experience;Time since onset of injury/illness/exacerbation;Fitness;Transportation;Other   Cognitive State   Comorbidities anemia, breast cancer, chronic constipation, diverticulosis, hypertension, hyperlipidemia, osteoporosis    Examination-Activity Limitations Bathing;Dressing;Transfers;Hygiene/Grooming;Bed Mobility;Squat;Lift;Bend;Locomotion Level;Stairs;Reach Overhead;Stand;Toileting;Carry    Examination-Participation Restrictions Church;Interpersonal Relationship;Personal Finances;Yard Work;Cleaning;Laundry;Community Activity;Medication  Management;Shop;Driving;Meal Prep    Stability/Clinical Decision Making Evolving/Moderate complexity    Rehab Potential Fair    PT Frequency 2x / week    PT Duration 8 weeks    PT Treatment/Interventions Moist Heat;DME Instruction;Therapeutic activities;Functional mobility training;Stair training;Gait training;Therapeutic exercise;Balance training;Neuromuscular re-education;Cognitive remediation;Orthotic Fit/Training;Patient/family education;Manual techniques;Scar mobilization;Passive range of motion;Energy conservation;Taping    PT Next Visit Plan // bar gait    PT Home Exercise Plan hamstring stretch; supported standing for ADLs    Consulted and Agree with Plan of Care Patient;Family member/caregiver    Family Member Consulted Shakara Tweedy, husband           Patient will benefit from skilled therapeutic intervention in order to improve the following deficits and impairments:  Abnormal gait, Decreased balance, Decreased endurance, Decreased mobility, Difficulty walking, Hypomobility, Postural dysfunction, Improper body mechanics, Increased edema, Decreased range of motion, Decreased cognition, Decreased activity tolerance, Decreased coordination, Decreased safety awareness, Decreased strength, Impaired flexibility  Visit Diagnosis: Decreased range of motion (ROM) of both knees  Muscle weakness (generalized)  Difficulty in walking, not elsewhere classified     Problem List Patient Active Problem List   Diagnosis Date Noted   Normocytic anemia 04/26/2020   Encounter for hospice care discussion    MRSA bacteremia 02/17/2020   Streptococcal bacteremia 02/17/2020   Bacteremia 02/17/2020   History of breast cancer 02/17/2020   Goals of care, counseling/discussion    Palliative care by specialist    DNR (do not resuscitate) discussion    Breast pain, right 01/11/2020   Femoral neck fracture ([place]) 06/27/2019   Femur fracture, left ([place]) 06/26/2019   Right lower lobe  pulmonary nodule 05/04/2019   Syncope 09/27/2017   Atypical chest pain 09/03/2017   Essential hypertension 09/03/2017   Short-term memory loss 09/03/2017   Osteoporosis 04/02/2016   Breast cancer, right ([place]) 04/06/2008   Myles Gip PT, DPT [phone]  08/02/2020, 2:05 PM  [facility] [address]. [place], [place], [address] Phone: [phone]   Fax:  [phone]  Name: Cynthia Walker MRN: [id number] Date of Birth: 12-25-1934

## 2020-08-07 ENCOUNTER — Ambulatory Visit: Payer: Medicare HMO | Attending: Family Medicine | Admitting: Physical Therapy

## 2020-08-07 ENCOUNTER — Other Ambulatory Visit: Payer: Self-pay

## 2020-08-07 ENCOUNTER — Encounter: Payer: Self-pay | Admitting: Physical Therapy

## 2020-08-07 DIAGNOSIS — R262 Difficulty in walking, not elsewhere classified: Secondary | ICD-10-CM | POA: Insufficient documentation

## 2020-08-07 DIAGNOSIS — M25661 Stiffness of right knee, not elsewhere classified: Secondary | ICD-10-CM | POA: Insufficient documentation

## 2020-08-07 DIAGNOSIS — M25662 Stiffness of left knee, not elsewhere classified: Secondary | ICD-10-CM | POA: Diagnosis present

## 2020-08-07 DIAGNOSIS — M6281 Muscle weakness (generalized): Secondary | ICD-10-CM | POA: Diagnosis present

## 2020-08-07 NOTE — Therapy (Signed)
[facility] [address] [place], [place], [address] Phone: [phone]   Fax:  [phone]  Physical Therapy Treatment  Patient Details  Name: Cynthia Walker MRN: [id number] Date of Birth: 01-May-1935 Referring Provider (PT): Feldpausch   Encounter Date: 08/07/2020   PT End of Session - 08/07/20 1220    Visit Number 36    Number of Visits 52    Date for PT Re-Evaluation 09/18/20    PT Start Time 1055    PT Stop Time 1150    PT Time Calculation (min) 55 min    Equipment Utilized During Treatment Gait belt    Activity Tolerance Other (comment);Patient limited by fatigue   Tx limited 2/2 to attention span and emotional lability   Behavior During Therapy [facility] for tasks assessed/performed   Highly distractable          Past Medical History:  Diagnosis Date   Breast cancer [facility]) 2009   Right breast CA with lumpectomy, radiation and chemo tx's.   Dementia ([place])    Hypertension    Personal history of chemotherapy    Personal history of radiation therapy     Past Surgical History:  Procedure Laterality Date   BREAST BIOPSY Right 03/15/2008   invasive mammary carcinoma   BREAST LUMPECTOMY Right 04/14/2008   Pathology revealed a 1.1 cm grade I invasive ductal carcinoma. Margins were negative.    COLONOSCOPY WITH PROPOFOL N/A 07/10/2015   Procedure: COLONOSCOPY WITH PROPOFOL;  Surgeon: Hulen Luster, MD;  Location: [facility] ENDOSCOPY;  Service: Gastroenterology;  Laterality: N/A;   INTRAMEDULLARY (IM) NAIL INTERTROCHANTERIC Left 06/27/2019   Procedure: INTRAMEDULLARY (IM) NAIL INTERTROCHANTRIC;  Surgeon: Creig Hines, MD;  Location: [facility] ORS;  Service: Orthopedics;  Laterality: Left;   TONSILLECTOMY     age of 85 years old    There were no vitals filed for this visit.   Subjective Assessment - 08/07/20 1219    Subjective Patient presents to clinic alert and amenable to therapy. Patient's spouse notes that she continues  to be able to stand for ~20 min for hygiene without issue.    Patient is accompained by: Family member   husband, Cynthia Walker   Pertinent History Femur fracture, left (CMS-HCC) 06/26/2019; Patient was ambulatory without AD prior to the fall that resulted in fx. Patient's spouse notes that she was capable of walking ~0.5 miles prior to the fall. Patient's spouse reports that she can at present stand for up to 10 min, but not consistently and not without assistance. While working with HHPT patient was able to ambulate with a RW for ~ 200 feet with rest breaks throughout.    Limitations Lifting;Standing;Walking;House hold activities    How long can you sit comfortably? Unlimited    How long can you stand comfortably? 5-10 min with support    How long can you walk comfortably? < 200 feet    Patient Stated Goals 2/2 to cognitive state, patient did not state speicifc goals. Spouse would like to see her ambulatory within the house.           TREATMENT  Neuromuscular Re-education: // bars: STS, BUE support, SBA, 1x5, with min encouragment and reorientation to task Gait with BUE support, SBA/CGA, x2, min VCs for sequencing and orientation to task. Patient with good sequencing and reciprocal UE movement.   Patient educated throughout session on appropriate technique and form using multi-modal cueing, HEP, and activity modification. Patient articulated understanding and returned demonstration.  Patient  Response to interventions: Patient states she is tired  ASSESSMENT Patient presents to clinic alert and willing to participate in therapy. Patient demonstrates deficits in cognition, posture, BLE strength, gait, balance, and function. Patient able to perform gait with good reciprocity of movement and minimal to no cueing during today's session and responded well to active interventions. Patient will benefit from continued skilled therapeutic intervention to address deficits in cognition, posture, BLE strength,  gait, balance, and function in order to increase function and improve overall QOL.     PT Long Term Goals - 07/24/20 1231      PT LONG TERM GOAL #1   Title Patient will be modified independent with spousal support with HEP for improved strength and balance in order to decrease fall risk and dependence at home and in the community.    Baseline IE: not demonstrated; 6/15: good cargegiver participation and understanding    Time 12    Period Weeks    Status Achieved      PT LONG TERM GOAL #2   Title Patient will demonstrate improved function as evidenced by a score of 44 on FOTO measure for full participation in activities at home and in the community.    Baseline IE: 16; 6/15: deferred 2/2 to technical issues; 7/27: 38; 9/13: deferred 2/2 to technical issues; 10/18: to be collected next visit    Time 8    Period Weeks    Status On-going    Target Date 09/18/20      PT LONG TERM GOAL #3   Title Patient will perform STS with BUE support with Modified IND/SBA with VCs for sequencing and safety in order to participate more fully in basic ADLs.    Baseline IE: Mod/MinA, MAX VCs and TCs for sequencing/safety; 6/15: SBA, BUE // bar support, Min/Mod VCs for sequencing; 7/27: SBA, BUE // bar support, mod encouragement for participation    Time 12    Period Weeks    Status Achieved      PT LONG TERM GOAL #4   Title Patient will demonstrate improved B knee extension ROM as evidenced by lacking < 10 degrees of B knee extension PROM/AROM in order to achieve standing posture with greater independence.    Baseline IE: B lacking 15-20 degrees of knee extension; 6/15: R 0 degrees, L lacking 5 degrees    Time 12    Period Weeks    Status Achieved      PT LONG TERM GOAL #5   Title Patient will demonstrate improved static standing balance as evidenced by ability to maintain standing posture with CGA/SBA, SUE support for 4 minutes in order to participate in basic ADLs.    Baseline IE: 90 sec, MinA, BUE  support; 6/15: 2.5 min, CGA, BUE support; 7/27: 4 min, SBA, BUE support; 9/13: 2 min, CGA, SUE support; 10/18: 15 min during ADLs, BUE support, CGA; in clinic 4 min SUE with occasional BUE support, CGA    Time 8    Period Weeks    Status On-going    Target Date 09/18/20      PT LONG TERM GOAL #6   Title Patient will be able to ambulate with RW ~ 25 feet, CGA and VCs for improved ability to participate in ADLs within the home.    Baseline 7/27: // bars 6 feet, SBA, max VCs/encouragement; 9/13: // bars, 18 ft, SBA/CGA, min to no VCs/encouragement; 10/18: // bars, 25 ft, SBA/CGA, min VCs    Time 8  Period Weeks    Status On-going    Target Date 09/18/20                 Plan - 08/07/20 1221    Clinical Impression Statement Patient presents to clinic alert and willing to participate in therapy. Patient demonstrates deficits in cognition, posture, BLE strength, gait, balance, and function. Patient able to perform gait with good reciprocity of movement and minimal to no cueing during today's session and responded well to active interventions. Patient will benefit from continued skilled therapeutic intervention to address deficits in cognition, posture, BLE strength, gait, balance, and function in order to increase function and improve overall QOL.    Personal Factors and Comorbidities Age;Education;Comorbidity 3+;Past/Current Experience;Time since onset of injury/illness/exacerbation;Fitness;Transportation;Other   Cognitive State   Comorbidities anemia, breast cancer, chronic constipation, diverticulosis, hypertension, hyperlipidemia, osteoporosis    Examination-Activity Limitations Bathing;Dressing;Transfers;Hygiene/Grooming;Bed Mobility;Squat;Lift;Bend;Locomotion Level;Stairs;Reach Overhead;Stand;Toileting;Carry    Examination-Participation Restrictions Church;Interpersonal Relationship;Personal Finances;Yard Work;Cleaning;Laundry;Community Activity;Medication Management;Shop;Driving;Meal  Prep    Stability/Clinical Decision Making Evolving/Moderate complexity    Rehab Potential Fair    PT Frequency 2x / week    PT Duration 8 weeks    PT Treatment/Interventions Moist Heat;DME Instruction;Therapeutic activities;Functional mobility training;Stair training;Gait training;Therapeutic exercise;Balance training;Neuromuscular re-education;Cognitive remediation;Orthotic Fit/Training;Patient/family education;Manual techniques;Scar mobilization;Passive range of motion;Energy conservation;Taping    PT Next Visit Plan // bar gait    PT Home Exercise Plan hamstring stretch; supported standing for ADLs    Consulted and Agree with Plan of Care Patient;Family member/caregiver    Family Member Consulted Cynthia Walker, husband           Patient will benefit from skilled therapeutic intervention in order to improve the following deficits and impairments:  Abnormal gait, Decreased balance, Decreased endurance, Decreased mobility, Difficulty walking, Hypomobility, Postural dysfunction, Improper body mechanics, Increased edema, Decreased range of motion, Decreased cognition, Decreased activity tolerance, Decreased coordination, Decreased safety awareness, Decreased strength, Impaired flexibility  Visit Diagnosis: Decreased range of motion (ROM) of both knees  Muscle weakness (generalized)  Difficulty in walking, not elsewhere classified     Problem List Patient Active Problem List   Diagnosis Date Noted   Normocytic anemia 04/26/2020   Encounter for hospice care discussion    MRSA bacteremia 02/17/2020   Streptococcal bacteremia 02/17/2020   Bacteremia 02/17/2020   History of breast cancer 02/17/2020   Goals of care, counseling/discussion    Palliative care by specialist    DNR (do not resuscitate) discussion    Breast pain, right 01/11/2020   Femoral neck fracture ([place]) 06/27/2019   Femur fracture, left ([place]) 06/26/2019   Right lower lobe pulmonary nodule 05/04/2019     Syncope 09/27/2017   Atypical chest pain 09/03/2017   Essential hypertension 09/03/2017   Short-term memory loss 09/03/2017   Osteoporosis 04/02/2016   Breast cancer, right ([place]) 04/06/2008   Myles Gip PT, DPT [phone]  08/07/2020, 12:26 PM  [facility] [address] [place], [place], [address] Phone: [phone]   Fax:  [phone]  Name: Cynthia Walker MRN: [id number] Date of Birth: Oct 17, 1934

## 2020-08-09 ENCOUNTER — Encounter: Payer: Self-pay | Admitting: Physical Therapy

## 2020-08-09 ENCOUNTER — Other Ambulatory Visit: Payer: Self-pay

## 2020-08-09 ENCOUNTER — Ambulatory Visit: Payer: Medicare HMO | Admitting: Physical Therapy

## 2020-08-09 DIAGNOSIS — M25661 Stiffness of right knee, not elsewhere classified: Secondary | ICD-10-CM | POA: Diagnosis not present

## 2020-08-09 DIAGNOSIS — R262 Difficulty in walking, not elsewhere classified: Secondary | ICD-10-CM

## 2020-08-09 DIAGNOSIS — M25662 Stiffness of left knee, not elsewhere classified: Secondary | ICD-10-CM

## 2020-08-09 DIAGNOSIS — M6281 Muscle weakness (generalized): Secondary | ICD-10-CM

## 2020-08-09 NOTE — Therapy (Signed)
[facility] [address] [place], [place], [address] Phone: [phone]   Fax:  [phone]  Physical Therapy Treatment  Patient Details  Name: Cynthia Walker MRN: [id number] Date of Birth: 09/03/1935 Referring Provider (PT): Feldpausch   Encounter Date: 08/09/2020   PT End of Session - 08/09/20 1401    Visit Number 37    Number of Visits 52    Date for PT Re-Evaluation 09/18/20    PT Start Time 1300    PT Stop Time 1355    PT Time Calculation (min) 55 min    Equipment Utilized During Treatment Gait belt    Activity Tolerance Other (comment);Patient limited by fatigue   Tx limited 2/2 to attention span and emotional lability   Behavior During Therapy [facility] for tasks assessed/performed   Highly distractable          Past Medical History:  Diagnosis Date  . Breast cancer [facility]) 2009   Right breast CA with lumpectomy, radiation and chemo tx's.  . Dementia ([place])   . Hypertension   . Personal history of chemotherapy   . Personal history of radiation therapy     Past Surgical History:  Procedure Laterality Date  . BREAST BIOPSY Right 03/15/2008   invasive mammary carcinoma  . BREAST LUMPECTOMY Right 04/14/2008   Pathology revealed a 1.1 cm grade I invasive ductal carcinoma. Margins were negative.   . COLONOSCOPY WITH PROPOFOL N/A 07/10/2015   Procedure: COLONOSCOPY WITH PROPOFOL;  Surgeon: Hulen Luster, MD;  [facility] ENDOSCOPY;  Service: Gastroenterology;  Laterality: N/A;  . INTRAMEDULLARY (IM) NAIL INTERTROCHANTERIC Left 06/27/2019   Procedure: INTRAMEDULLARY (IM) NAIL INTERTROCHANTRIC;  Surgeon: Creig Hines, MD;  Location: [facility] ORS;  Service: Orthopedics;  Laterality: Left;  . TONSILLECTOMY     age of 85 years old    There were no vitals filed for this visit.   Subjective Assessment - 08/09/20 1358    Subjective Patient presents to clinic with spouse. Patient notes that the change in temperatures outside is  too cold for her. Spouse notes that along with basic ADLs; they also worked on STS x4 this morning.    Patient is accompained by: Family member   husband, [name]   Pertinent History Femur fracture, left (CMS-HCC) 06/26/2019; Patient was ambulatory without AD prior to the fall that resulted in fx. Patient's spouse notes that she was capable of walking ~0.5 miles prior to the fall. Patient's spouse reports that she can at present stand for up to 10 min, but not consistently and not without assistance. While working with HHPT patient was able to ambulate with a RW for ~ 200 feet with rest breaks throughout.    Limitations Lifting;Standing;Walking;House hold activities    How long can you sit comfortably? Unlimited    How long can you stand comfortably? 5-10 min with support    How long can you walk comfortably? < 200 feet    Patient Stated Goals 2/2 to cognitive state, patient did not state speicifc goals. Spouse would like to see her ambulatory within the house.             TREATMENT  Neuromuscular Re-education: // bars: STS, BUE support x5, SBA; SUE support x5, CGA with min encouragment and reorientation to task Gait with BUE support, SBA/CGA, x2, min VCs for sequencing and orientation to task. Patient with good sequencing and reciprocal UE movement.   Patient educated throughout session on appropriate technique and form using multi-modal  cueing, HEP, and activity modification. Patient articulated understanding and returned demonstration.  Patient Response to interventions: Patient states she is ready to go get a milkshake  ASSESSMENT Patient presents to clinic alert and willing to participate in therapy. Patient demonstrates deficits in cognition, posture, BLE strength, gait, balance, and function. Patient able to perform 5 repetitions of STS with SUE support and CGA during today's session and responded well to active interventions. Patient will benefit from continued skilled therapeutic  intervention to address deficits in cognition, posture, BLE strength, gait, balance, and function in order to increase function and improve overall QOL.     PT Long Term Goals - 07/24/20 1231      PT LONG TERM GOAL #1   Title Patient will be modified independent with spousal support with HEP for improved strength and balance in order to decrease fall risk and dependence at home and in the community.    Baseline IE: not demonstrated; 6/15: good cargegiver participation and understanding    Time 12    Period Weeks    Status Achieved      PT LONG TERM GOAL #2   Title Patient will demonstrate improved function as evidenced by a score of 44 on FOTO measure for full participation in activities at home and in the community.    Baseline IE: 16; 6/15: deferred 2/2 to technical issues; 7/27: 38; 9/13: deferred 2/2 to technical issues; 10/18: to be collected next visit    Time 8    Period Weeks    Status On-going    Target Date 09/18/20      PT LONG TERM GOAL #3   Title Patient will perform STS with BUE support with Modified IND/SBA with VCs for sequencing and safety in order to participate more fully in basic ADLs.    Baseline IE: Mod/MinA, MAX VCs and TCs for sequencing/safety; 6/15: SBA, BUE // bar support, Min/Mod VCs for sequencing; 7/27: SBA, BUE // bar support, mod encouragement for participation    Time 12    Period Weeks    Status Achieved      PT LONG TERM GOAL #4   Title Patient will demonstrate improved B knee extension ROM as evidenced by lacking < 10 degrees of B knee extension PROM/AROM in order to achieve standing posture with greater independence.    Baseline IE: B lacking 15-20 degrees of knee extension; 6/15: R 0 degrees, L lacking 5 degrees    Time 12    Period Weeks    Status Achieved      PT LONG TERM GOAL #5   Title Patient will demonstrate improved static standing balance as evidenced by ability to maintain standing posture with CGA/SBA, SUE support for 4 minutes in  order to participate in basic ADLs.    Baseline IE: 90 sec, MinA, BUE support; 6/15: 2.5 min, CGA, BUE support; 7/27: 4 min, SBA, BUE support; 9/13: 2 min, CGA, SUE support; 10/18: 15 min during ADLs, BUE support, CGA; in clinic 4 min SUE with occasional BUE support, CGA    Time 8    Period Weeks    Status On-going    Target Date 09/18/20      PT LONG TERM GOAL #6   Title Patient will be able to ambulate with RW ~ 25 feet, CGA and VCs for improved ability to participate in ADLs within the home.    Baseline 7/27: // bars 6 feet, SBA, max VCs/encouragement; 9/13: // bars, 18 ft, SBA/CGA, min to no  VCs/encouragement; 10/18: // bars, 25 ft, SBA/CGA, min VCs    Time 8    Period Weeks    Status On-going    Target Date 09/18/20                 Plan - 08/09/20 1402    Clinical Impression Statement Patient presents to clinic alert and willing to participate in therapy. Patient demonstrates deficits in cognition, posture, BLE strength, gait, balance, and function. Patient able to perform 5 repetitions of STS with SUE support and CGA during today's session and responded well to active interventions. Patient will benefit from continued skilled therapeutic intervention to address deficits in cognition, posture, BLE strength, gait, balance, and function in order to increase function and improve overall QOL.    Personal Factors and Comorbidities Age;Education;Comorbidity 3+;Past/Current Experience;Time since onset of injury/illness/exacerbation;Fitness;Transportation;Other   Cognitive State   Comorbidities anemia, breast cancer, chronic constipation, diverticulosis, hypertension, hyperlipidemia, osteoporosis    Examination-Activity Limitations Bathing;Dressing;Transfers;Hygiene/Grooming;Bed Mobility;Squat;Lift;Bend;Locomotion Level;Stairs;Reach Overhead;Stand;Toileting;Carry    Examination-Participation Restrictions Church;Interpersonal Relationship;Personal Finances;Yard  Work;Cleaning;Laundry;Community Activity;Medication Management;Shop;Driving;Meal Prep    Stability/Clinical Decision Making Evolving/Moderate complexity    Rehab Potential Fair    PT Frequency 2x / week    PT Duration 8 weeks    PT Treatment/Interventions Moist Heat;DME Instruction;Therapeutic activities;Functional mobility training;Stair training;Gait training;Therapeutic exercise;Balance training;Neuromuscular re-education;Cognitive remediation;Orthotic Fit/Training;Patient/family education;Manual techniques;Scar mobilization;Passive range of motion;Energy conservation;Taping    PT Next Visit Plan // bar gait    PT Home Exercise Plan hamstring stretch; supported standing for ADLs    Consulted and Agree with Plan of Care Patient;Family member/caregiver    Family Member Consulted Cleo Santucci, husband           Patient will benefit from skilled therapeutic intervention in order to improve the following deficits and impairments:  Abnormal gait, Decreased balance, Decreased endurance, Decreased mobility, Difficulty walking, Hypomobility, Postural dysfunction, Improper body mechanics, Increased edema, Decreased range of motion, Decreased cognition, Decreased activity tolerance, Decreased coordination, Decreased safety awareness, Decreased strength, Impaired flexibility  Visit Diagnosis: Decreased range of motion (ROM) of both knees  Muscle weakness (generalized)  Difficulty in walking, not elsewhere classified     Problem List Patient Active Problem List   Diagnosis Date Noted  . Normocytic anemia 04/26/2020  . Encounter for hospice care discussion   . MRSA bacteremia 02/17/2020  . Streptococcal bacteremia 02/17/2020  . Bacteremia 02/17/2020  . History of breast cancer 02/17/2020  . Goals of care, counseling/discussion   . Palliative care by specialist   . DNR (do not resuscitate) discussion   . Breast pain, right 01/11/2020  . Femoral neck fracture ([place]) 06/27/2019  . Femur  fracture, left ([place]) 06/26/2019  . Right lower lobe pulmonary nodule 05/04/2019  . Syncope 09/27/2017  . Atypical chest pain 09/03/2017  . Essential hypertension 09/03/2017  . Short-term memory loss 09/03/2017  . Osteoporosis 04/02/2016  . Breast cancer, right [facility]) 04/06/2008   Myles Gip PT, DPT [phone] 08/09/2020, 2:08 PM  [facility] [address] [place], [place], [address] Phone: [phone]   Fax:  [phone]  Name: Cynthia Walker MRN: [id number] Date of Birth: 1935-03-15

## 2020-08-14 ENCOUNTER — Encounter: Payer: Self-pay | Admitting: Physical Therapy

## 2020-08-14 ENCOUNTER — Other Ambulatory Visit: Payer: Self-pay

## 2020-08-14 ENCOUNTER — Ambulatory Visit: Payer: Medicare HMO | Admitting: Physical Therapy

## 2020-08-14 DIAGNOSIS — M6281 Muscle weakness (generalized): Secondary | ICD-10-CM

## 2020-08-14 DIAGNOSIS — M25661 Stiffness of right knee, not elsewhere classified: Secondary | ICD-10-CM | POA: Diagnosis not present

## 2020-08-14 DIAGNOSIS — R262 Difficulty in walking, not elsewhere classified: Secondary | ICD-10-CM

## 2020-08-14 NOTE — Therapy (Signed)
[facility] [address]. [place], [place], [address] Phone: [phone]   Fax:  [phone]  Physical Therapy Treatment  Patient Details  Name: Cynthia Walker MRN: [id number] Date of Birth: Oct 11, 1934 Referring Provider (PT): Feldpausch   Encounter Date: 08/14/2020   PT End of Session - 08/14/20 1217    Visit Number 38    Number of Visits 52    Date for PT Re-Evaluation 09/18/20    PT Start Time 1112    PT Stop Time 1200    PT Time Calculation (min) 48 min    Equipment Utilized During Treatment Gait belt    Activity Tolerance Other (comment);Patient limited by fatigue   Tx limited 2/2 to attention span and emotional lability   Behavior During Therapy [facility] for tasks assessed/performed   Highly distractable          Past Medical History:  Diagnosis Date   Breast cancer [facility]) 2009   Right breast CA with lumpectomy, radiation and chemo tx's.   Dementia ([place])    Hypertension    Personal history of chemotherapy    Personal history of radiation therapy     Past Surgical History:  Procedure Laterality Date   BREAST BIOPSY Right 03/15/2008   invasive mammary carcinoma   BREAST LUMPECTOMY Right 04/14/2008   Pathology revealed a 1.1 cm grade I invasive ductal carcinoma. Margins were negative.    COLONOSCOPY WITH PROPOFOL N/A 07/10/2015   Procedure: COLONOSCOPY WITH PROPOFOL;  Surgeon: Hulen Luster, MD;  Location: [facility] ENDOSCOPY;  Service: Gastroenterology;  Laterality: N/A;   INTRAMEDULLARY (IM) NAIL INTERTROCHANTERIC Left 06/27/2019   Procedure: INTRAMEDULLARY (IM) NAIL INTERTROCHANTRIC;  Surgeon: Creig Hines, MD;  Location: [facility] ORS;  Service: Orthopedics;  Laterality: Left;   TONSILLECTOMY     age of 85 years old    There were no vitals filed for this visit.   Subjective Assessment - 08/14/20 1230    Subjective Patient and spouse arrive to clinic 10-15 min late. Patient's spouse notes that patient has been  much more somnolent this AM; he adds that this is the first time he has had to actively feed her brekafast in order to get her to eat. Spouse notes positively that patient was able to stand with BUE support for ~20 min this morning for hygiene and dressing. Patient notes that she is not interested in participating in therapy today.    Patient is accompained by: Family member   husband, [name]   Pertinent History Femur fracture, left (CMS-HCC) 06/26/2019; Patient was ambulatory without AD prior to the fall that resulted in fx. Patient's spouse notes that she was capable of walking ~0.5 miles prior to the fall. Patient's spouse reports that she can at present stand for up to 10 min, but not consistently and not without assistance. While working with HHPT patient was able to ambulate with a RW for ~ 200 feet with rest breaks throughout.    Limitations Lifting;Standing;Walking;House hold activities    How long can you sit comfortably? Unlimited    How long can you stand comfortably? 5-10 min with support    How long can you walk comfortably? < 200 feet    Patient Stated Goals 2/2 to cognitive state, patient did not state speicifc goals. Spouse would like to see her ambulatory within the house.    Currently in Pain? No/denies           TREATMENT  Neuromuscular Re-education: // bars: STS, BUE support  x2, SBA, min encouragment and reorientation to task Static standing, BUE, SBA, x6 min with occasional verbal cues and encouragement to walk Gait with BUE support, SBA/CGA, x1, min VCs for sequencing and orientation to task. Patient with good sequencing and reciprocal UE movement.   Patient educated throughout session on appropriate technique and form using multi-modal cueing, HEP, and activity modification. Patient articulated understanding and returned demonstration.  Patient Response to interventions: Patient asleep in wheelchair.  ASSESSMENT Patient presents to clinic alert and willing to  participate in therapy. Patient demonstrates deficits in cognition, posture, BLE strength, gait, balance, and function. Patient requiring maximum encouragement from DPT and caregiver to participate during today's session and responded minimally to encouragement. However, patient was ambulating with increased stride length B today. Patient will benefit from continued skilled therapeutic intervention to address deficits in cognition, posture, BLE strength, gait, balance, and function in order to increase function and improve overall QOL.      PT Long Term Goals - 07/24/20 1231      PT LONG TERM GOAL #1   Title Patient will be modified independent with spousal support with HEP for improved strength and balance in order to decrease fall risk and dependence at home and in the community.    Baseline IE: not demonstrated; 6/15: good cargegiver participation and understanding    Time 12    Period Weeks    Status Achieved      PT LONG TERM GOAL #2   Title Patient will demonstrate improved function as evidenced by a score of 44 on FOTO measure for full participation in activities at home and in the community.    Baseline IE: 16; 6/15: deferred 2/2 to technical issues; 7/27: 38; 9/13: deferred 2/2 to technical issues; 10/18: to be collected next visit    Time 8    Period Weeks    Status On-going    Target Date 09/18/20      PT LONG TERM GOAL #3   Title Patient will perform STS with BUE support with Modified IND/SBA with VCs for sequencing and safety in order to participate more fully in basic ADLs.    Baseline IE: Mod/MinA, MAX VCs and TCs for sequencing/safety; 6/15: SBA, BUE // bar support, Min/Mod VCs for sequencing; 7/27: SBA, BUE // bar support, mod encouragement for participation    Time 12    Period Weeks    Status Achieved      PT LONG TERM GOAL #4   Title Patient will demonstrate improved B knee extension ROM as evidenced by lacking < 10 degrees of B knee extension PROM/AROM in order to  achieve standing posture with greater independence.    Baseline IE: B lacking 15-20 degrees of knee extension; 6/15: R 0 degrees, L lacking 5 degrees    Time 12    Period Weeks    Status Achieved      PT LONG TERM GOAL #5   Title Patient will demonstrate improved static standing balance as evidenced by ability to maintain standing posture with CGA/SBA, SUE support for 4 minutes in order to participate in basic ADLs.    Baseline IE: 90 sec, MinA, BUE support; 6/15: 2.5 min, CGA, BUE support; 7/27: 4 min, SBA, BUE support; 9/13: 2 min, CGA, SUE support; 10/18: 15 min during ADLs, BUE support, CGA; in clinic 4 min SUE with occasional BUE support, CGA    Time 8    Period Weeks    Status On-going    Target Date 09/18/20  PT LONG TERM GOAL #6   Title Patient will be able to ambulate with RW ~ 25 feet, CGA and VCs for improved ability to participate in ADLs within the home.    Baseline 7/27: // bars 6 feet, SBA, max VCs/encouragement; 9/13: // bars, 18 ft, SBA/CGA, min to no VCs/encouragement; 10/18: // bars, 25 ft, SBA/CGA, min VCs    Time 8    Period Weeks    Status On-going    Target Date 09/18/20                 Plan - 08/14/20 1229    Clinical Impression Statement Patient presents to clinic alert and willing to participate in therapy. Patient demonstrates deficits in cognition, posture, BLE strength, gait, balance, and function. Patient requiring maximum encouragement from DPT and caregiver to participate during today's session and responded minimally to encouragement. However, patient was ambulating with increased stride length B today. Patient will benefit from continued skilled therapeutic intervention to address deficits in cognition, posture, BLE strength, gait, balance, and function in order to increase function and improve overall QOL.    Personal Factors and Comorbidities Age;Education;Comorbidity 3+;Past/Current Experience;Time since onset of  injury/illness/exacerbation;Fitness;Transportation;Other   Cognitive State   Comorbidities anemia, breast cancer, chronic constipation, diverticulosis, hypertension, hyperlipidemia, osteoporosis    Examination-Activity Limitations Bathing;Dressing;Transfers;Hygiene/Grooming;Bed Mobility;Squat;Lift;Bend;Locomotion Level;Stairs;Reach Overhead;Stand;Toileting;Carry    Examination-Participation Restrictions Church;Interpersonal Relationship;Personal Finances;Yard Work;Cleaning;Laundry;Community Activity;Medication Management;Shop;Driving;Meal Prep    Stability/Clinical Decision Making Evolving/Moderate complexity    Rehab Potential Fair    PT Frequency 2x / week    PT Duration 8 weeks    PT Treatment/Interventions Moist Heat;DME Instruction;Therapeutic activities;Functional mobility training;Stair training;Gait training;Therapeutic exercise;Balance training;Neuromuscular re-education;Cognitive remediation;Orthotic Fit/Training;Patient/family education;Manual techniques;Scar mobilization;Passive range of motion;Energy conservation;Taping    PT Next Visit Plan // bar gait    PT Home Exercise Plan hamstring stretch; supported standing for ADLs    Consulted and Agree with Plan of Care Patient;Family member/caregiver    Family Member Consulted Leetta Hendriks, husband           Patient will benefit from skilled therapeutic intervention in order to improve the following deficits and impairments:  Abnormal gait, Decreased balance, Decreased endurance, Decreased mobility, Difficulty walking, Hypomobility, Postural dysfunction, Improper body mechanics, Increased edema, Decreased range of motion, Decreased cognition, Decreased activity tolerance, Decreased coordination, Decreased safety awareness, Decreased strength, Impaired flexibility  Visit Diagnosis: Decreased range of motion (ROM) of both knees  Muscle weakness (generalized)  Difficulty in walking, not elsewhere classified     Problem  List Patient Active Problem List   Diagnosis Date Noted   Normocytic anemia 04/26/2020   Encounter for hospice care discussion    MRSA bacteremia 02/17/2020   Streptococcal bacteremia 02/17/2020   Bacteremia 02/17/2020   History of breast cancer 02/17/2020   Goals of care, counseling/discussion    Palliative care by specialist    DNR (do not resuscitate) discussion    Breast pain, right 01/11/2020   Femoral neck fracture ([place]) 06/27/2019   Femur fracture, left ([place]) 06/26/2019   Right lower lobe pulmonary nodule 05/04/2019   Syncope 09/27/2017   Atypical chest pain 09/03/2017   Essential hypertension 09/03/2017   Short-term memory loss 09/03/2017   Osteoporosis 04/02/2016   Breast cancer, right ([place]) 04/06/2008   Myles Gip PT, DPT [id number] 08/14/2020, 12:37 PM   [facility] [address] [place], [place], [address] Phone: [phone]   Fax:  [phone]  Name: Cynthia Walker MRN: [id number] Date of Birth: 1935-01-08

## 2020-08-16 ENCOUNTER — Encounter: Payer: Self-pay | Admitting: Physical Therapy

## 2020-08-16 ENCOUNTER — Ambulatory Visit: Payer: Medicare HMO | Admitting: Physical Therapy

## 2020-08-16 ENCOUNTER — Other Ambulatory Visit: Payer: Self-pay

## 2020-08-16 DIAGNOSIS — M25661 Stiffness of right knee, not elsewhere classified: Secondary | ICD-10-CM

## 2020-08-16 DIAGNOSIS — M25662 Stiffness of left knee, not elsewhere classified: Secondary | ICD-10-CM

## 2020-08-16 DIAGNOSIS — M6281 Muscle weakness (generalized): Secondary | ICD-10-CM

## 2020-08-16 DIAGNOSIS — R262 Difficulty in walking, not elsewhere classified: Secondary | ICD-10-CM

## 2020-08-16 NOTE — Therapy (Signed)
[place] [address] [place], [place], [address] Phone: [phone]   Fax:  [phone]  Physical Therapy Treatment  Patient Details  Name: Cynthia Walker MRN: [id number] Date of Birth: 12/02/1934 Referring Provider (PT): Feldpausch   Encounter Date: 08/16/2020   PT End of Session - 08/16/20 1349    Visit Number 39    Number of Visits 52    Date for PT Re-Evaluation 09/18/20    PT Start Time 1300    PT Stop Time 1340    PT Time Calculation (min) 40 min    Equipment Utilized During Treatment Gait belt    Activity Tolerance Other (comment);Patient limited by fatigue   Tx limited 2/2 to attention span and emotional lability   Behavior During Therapy [facility] for tasks assessed/performed   Highly distractable          Past Medical History:  Diagnosis Date  . Breast cancer [facility]) 2009   Right breast CA with lumpectomy, radiation and chemo tx's.  . Dementia ([place])   . Hypertension   . Personal history of chemotherapy   . Personal history of radiation therapy     Past Surgical History:  Procedure Laterality Date  . BREAST BIOPSY Right 03/15/2008   invasive mammary carcinoma  . BREAST LUMPECTOMY Right 04/14/2008   Pathology revealed a 1.1 cm grade I invasive ductal carcinoma. Margins were negative.   . COLONOSCOPY WITH PROPOFOL N/A 07/10/2015   Procedure: COLONOSCOPY WITH PROPOFOL;  Surgeon: Hulen Luster, MD;  Location: [facility] ENDOSCOPY;  Service: Gastroenterology;  Laterality: N/A;  . INTRAMEDULLARY (IM) NAIL INTERTROCHANTERIC Left 06/27/2019   Procedure: INTRAMEDULLARY (IM) NAIL INTERTROCHANTRIC;  Surgeon: Creig Hines, MD;  Location: [facility] ORS;  Service: Orthopedics;  Laterality: Left;  . TONSILLECTOMY     age of 85 years old    There were no vitals filed for this visit.   Subjective Assessment - 08/16/20 1348    Subjective Patient's spouse notes patient has been very drowsy this morning. She was able to STS x4 and  maintain standing with BUE support on grab bar for 25 minutes this morning. Patient's spouse also reports that results from recent urine culture indicate infection and patient has started a course of ciproflaxin (she has only had 2 doses).    Patient is accompained by: Family member   husband, Cynthia Walker   Pertinent History Femur fracture, left (CMS-HCC) 06/26/2019; Patient was ambulatory without AD prior to the fall that resulted in fx. Patient's spouse notes that she was capable of walking ~0.5 miles prior to the fall. Patient's spouse reports that she can at present stand for up to 10 min, but not consistently and not without assistance. While working with HHPT patient was able to ambulate with a RW for ~ 200 feet with rest breaks throughout.    Limitations Lifting;Standing;Walking;House hold activities    How long can you sit comfortably? Unlimited    How long can you stand comfortably? 5-10 min with support    How long can you walk comfortably? < 200 feet    Patient Stated Goals 2/2 to cognitive state, patient did not state speicifc goals. Spouse would like to see her ambulatory within the house.    Currently in Pain? No/denies           TREATMENT  Neuromuscular Re-education: // bars: STS, BUE support x5, Min A, max encouragment and reorientation to task Static standing, BUE, SBA, x6 min, x2 min with occasional verbal cues  and encouragement to walk Gait with BUE support, SBA/CGA, x5 ft, min VCs for sequencing and orientation to task. Patient with good sequencing and reciprocal UE movement.   Patient educated throughout session on appropriate technique and form using multi-modal cueing, HEP, and activity modification. Patient articulated understanding and returned demonstration.  Patient Response to interventions: Patient asleep in wheelchair.  ASSESSMENT Patient presents to clinic drowsy but willing to participate in therapy. Patient demonstrates deficits in cognition, posture, BLE  strength, gait, balance, and function. Patient requiring maximum encouragement from DPT and caregiver to participate during today's session and responded minimally to encouragement. Session was discontinued early 2/2 to patient's level of arousal and unwillingness to participate further. Patient will benefit from continued skilled therapeutic intervention to address deficits in cognition, posture, BLE strength, gait, balance, and function in order to increase function and improve overall QOL.      PT Long Term Goals - 07/24/20 1231      PT LONG TERM GOAL #1   Title Patient will be modified independent with spousal support with HEP for improved strength and balance in order to decrease fall risk and dependence at home and in the community.    Baseline IE: not demonstrated; 6/15: good cargegiver participation and understanding    Time 12    Period Weeks    Status Achieved      PT LONG TERM GOAL #2   Title Patient will demonstrate improved function as evidenced by a score of 44 on FOTO measure for full participation in activities at home and in the community.    Baseline IE: 16; 6/15: deferred 2/2 to technical issues; 7/27: 38; 9/13: deferred 2/2 to technical issues; 10/18: to be collected next visit    Time 8    Period Weeks    Status On-going    Target Date 09/18/20      PT LONG TERM GOAL #3   Title Patient will perform STS with BUE support with Modified IND/SBA with VCs for sequencing and safety in order to participate more fully in basic ADLs.    Baseline IE: Mod/MinA, MAX VCs and TCs for sequencing/safety; 6/15: SBA, BUE // bar support, Min/Mod VCs for sequencing; 7/27: SBA, BUE // bar support, mod encouragement for participation    Time 12    Period Weeks    Status Achieved      PT LONG TERM GOAL #4   Title Patient will demonstrate improved B knee extension ROM as evidenced by lacking < 10 degrees of B knee extension PROM/AROM in order to achieve standing posture with greater  independence.    Baseline IE: B lacking 15-20 degrees of knee extension; 6/15: R 0 degrees, L lacking 5 degrees    Time 12    Period Weeks    Status Achieved      PT LONG TERM GOAL #5   Title Patient will demonstrate improved static standing balance as evidenced by ability to maintain standing posture with CGA/SBA, SUE support for 4 minutes in order to participate in basic ADLs.    Baseline IE: 90 sec, MinA, BUE support; 6/15: 2.5 min, CGA, BUE support; 7/27: 4 min, SBA, BUE support; 9/13: 2 min, CGA, SUE support; 10/18: 15 min during ADLs, BUE support, CGA; in clinic 4 min SUE with occasional BUE support, CGA    Time 8    Period Weeks    Status On-going    Target Date 09/18/20      PT LONG TERM GOAL #6  Title Patient will be able to ambulate with RW ~ 25 feet, CGA and VCs for improved ability to participate in ADLs within the home.    Baseline 7/27: // bars 6 feet, SBA, max VCs/encouragement; 9/13: // bars, 18 ft, SBA/CGA, min to no VCs/encouragement; 10/18: // bars, 25 ft, SBA/CGA, min VCs    Time 8    Period Weeks    Status On-going    Target Date 09/18/20                 Plan - 08/16/20 1350    Clinical Impression Statement Patient presents to clinic drowsy but willing to participate in therapy. Patient demonstrates deficits in cognition, posture, BLE strength, gait, balance, and function. Patient requiring maximum encouragement from DPT and caregiver to participate during today's session and responded minimally to encouragement. Session was discontinued early 2/2 to patient's level of arousal and unwillingness to participate further. Patient will benefit from continued skilled therapeutic intervention to address deficits in cognition, posture, BLE strength, gait, balance, and function in order to increase function and improve overall QOL.    Personal Factors and Comorbidities Age;Education;Comorbidity 3+;Past/Current Experience;Time since onset of  injury/illness/exacerbation;Fitness;Transportation;Other   Cognitive State   Comorbidities anemia, breast cancer, chronic constipation, diverticulosis, hypertension, hyperlipidemia, osteoporosis    Examination-Activity Limitations Bathing;Dressing;Transfers;Hygiene/Grooming;Bed Mobility;Squat;Lift;Bend;Locomotion Level;Stairs;Reach Overhead;Stand;Toileting;Carry    Examination-Participation Restrictions Church;Interpersonal Relationship;Personal Finances;Yard Work;Cleaning;Laundry;Community Activity;Medication Management;Shop;Driving;Meal Prep    Stability/Clinical Decision Making Evolving/Moderate complexity    Rehab Potential Fair    PT Frequency 2x / week    PT Duration 8 weeks    PT Treatment/Interventions Moist Heat;DME Instruction;Therapeutic activities;Functional mobility training;Stair training;Gait training;Therapeutic exercise;Balance training;Neuromuscular re-education;Cognitive remediation;Orthotic Fit/Training;Patient/family education;Manual techniques;Scar mobilization;Passive range of motion;Energy conservation;Taping    PT Next Visit Plan // bar gait    PT Home Exercise Plan hamstring stretch; supported standing for ADLs    Consulted and Agree with Plan of Care Patient;Family member/caregiver    Family Member Consulted Shenekia Riess, husband           Patient will benefit from skilled therapeutic intervention in order to improve the following deficits and impairments:  Abnormal gait, Decreased balance, Decreased endurance, Decreased mobility, Difficulty walking, Hypomobility, Postural dysfunction, Improper body mechanics, Increased edema, Decreased range of motion, Decreased cognition, Decreased activity tolerance, Decreased coordination, Decreased safety awareness, Decreased strength, Impaired flexibility  Visit Diagnosis: Decreased range of motion (ROM) of both knees  Muscle weakness (generalized)  Difficulty in walking, not elsewhere classified     Problem  List Patient Active Problem List   Diagnosis Date Noted  . Normocytic anemia 04/26/2020  . Encounter for hospice care discussion   . MRSA bacteremia 02/17/2020  . Streptococcal bacteremia 02/17/2020  . Bacteremia 02/17/2020  . History of breast cancer 02/17/2020  . Goals of care, counseling/discussion   . Palliative care by specialist   . DNR (do not resuscitate) discussion   . Breast pain, right 01/11/2020  . Femoral neck fracture ([place]) 06/27/2019  . Femur fracture, left ([place]) 06/26/2019  . Right lower lobe pulmonary nodule 05/04/2019  . Syncope 09/27/2017  . Atypical chest pain 09/03/2017  . Essential hypertension 09/03/2017  . Short-term memory loss 09/03/2017  . Osteoporosis 04/02/2016  . Breast cancer, right [facility]) 04/06/2008   Myles Gip PT, DPT [id number]  08/16/2020, 1:54 PM  [place] [facility] [address] [place], [place], [address] Phone: [phone]   Fax:  [phone]  Name: Cynthia Walker MRN: [id number] Date of Birth: 03-05-1935

## 2020-08-21 ENCOUNTER — Encounter: Payer: Self-pay | Admitting: Physical Therapy

## 2020-08-21 ENCOUNTER — Other Ambulatory Visit: Payer: Self-pay

## 2020-08-21 ENCOUNTER — Ambulatory Visit: Payer: Medicare HMO | Admitting: Physical Therapy

## 2020-08-21 DIAGNOSIS — M25662 Stiffness of left knee, not elsewhere classified: Secondary | ICD-10-CM

## 2020-08-21 DIAGNOSIS — M25661 Stiffness of right knee, not elsewhere classified: Secondary | ICD-10-CM

## 2020-08-21 DIAGNOSIS — R262 Difficulty in walking, not elsewhere classified: Secondary | ICD-10-CM

## 2020-08-21 DIAGNOSIS — M6281 Muscle weakness (generalized): Secondary | ICD-10-CM

## 2020-08-21 NOTE — Therapy (Signed)
[place] [facility] [address] [place], [place], [address] Phone: [phone]   Fax:  [phone]  Physical Therapy Visit (Arrived-Cancelled)  Patient Details  Name: Cynthia Walker MRN: [id number] Date of Birth: 1934/12/24 Referring Provider (PT): Feldpausch   Encounter Date: 08/21/2020   PT End of Session - 08/21/20 1246    Visit Number 39    Number of Visits 52    Date for PT Re-Evaluation 09/18/20    PT Start Time 1111    PT Stop Time 1150    PT Time Calculation (min) 39 min    Equipment Utilized During Treatment Gait belt    Activity Tolerance Other (comment);Patient limited by fatigue   Tx limited 2/2 to attention span and emotional lability   Behavior During Therapy --   Asleep          Past Medical History:  Diagnosis Date  . Breast cancer [facility]) 2009   Right breast CA with lumpectomy, radiation and chemo tx's.  . Dementia ([place])   . Hypertension   . Personal history of chemotherapy   . Personal history of radiation therapy     Past Surgical History:  Procedure Laterality Date  . BREAST BIOPSY Right 03/15/2008   invasive mammary carcinoma  . BREAST LUMPECTOMY Right 04/14/2008   Pathology revealed a 1.1 cm grade I invasive ductal carcinoma. Margins were negative.   . COLONOSCOPY WITH PROPOFOL N/A 07/10/2015   Procedure: COLONOSCOPY WITH PROPOFOL;  Surgeon: Hulen Luster, MD;  Location: [facility] ENDOSCOPY;  Service: Gastroenterology;  Laterality: N/A;  . INTRAMEDULLARY (IM) NAIL INTERTROCHANTERIC Left 06/27/2019   Procedure: INTRAMEDULLARY (IM) NAIL INTERTROCHANTRIC;  Surgeon: Creig Hines, MD;  Location: [facility] ORS;  Service: Orthopedics;  Laterality: Left;  . TONSILLECTOMY     age of 85 years old    There were no vitals filed for this visit.   Subjective Assessment - 08/21/20 1244    Subjective Patient's spouse states that he tried a different morning routine to see if it would increase arousal for therapy, but notes  that patient has been very tired all morning. Patient articulates pain in BLE in thighs, but cannot specify where the pain is or what causes it.    Patient is accompained by: Family member   husband, [name]   Pertinent History Femur fracture, left (CMS-HCC) 06/26/2019; Patient was ambulatory without AD prior to the fall that resulted in fx. Patient's spouse notes that she was capable of walking ~0.5 miles prior to the fall. Patient's spouse reports that she can at present stand for up to 10 min, but not consistently and not without assistance. While working with HHPT patient was able to ambulate with a RW for ~ 200 feet with rest breaks throughout.    Limitations Lifting;Standing;Walking;House hold activities    How long can you sit comfortably? Unlimited    How long can you stand comfortably? 5-10 min with support    How long can you walk comfortably? < 200 feet    Patient Stated Goals 2/2 to cognitive state, patient did not state speicifc goals. Spouse would like to see her ambulatory within the house.    Currently in Pain? Yes    Pain Score --   grimaces with BLE movement   Pain Location Leg    Pain Orientation Left;Right          DPT and spouse attempted to awaken patient for participation in therapy with no success. Patient, spouse, and DPT were eventually  able to reposition patient in w/c for decreased likelihood of skin breakdown, improved posture and safety, but ultimately patient unwilling/unable to participate in physical therapy on this date.           PT Long Term Goals - 07/24/20 1231      PT LONG TERM GOAL #1   Title Patient will be modified independent with spousal support with HEP for improved strength and balance in order to decrease fall risk and dependence at home and in the community.    Baseline IE: not demonstrated; 6/15: good cargegiver participation and understanding    Time 12    Period Weeks    Status Achieved      PT LONG TERM GOAL #2   Title Patient will  demonstrate improved function as evidenced by a score of 44 on FOTO measure for full participation in activities at home and in the community.    Baseline IE: 16; 6/15: deferred 2/2 to technical issues; 7/27: 38; 9/13: deferred 2/2 to technical issues; 10/18: to be collected next visit    Time 8    Period Weeks    Status On-going    Target Date 09/18/20      PT LONG TERM GOAL #3   Title Patient will perform STS with BUE support with Modified IND/SBA with VCs for sequencing and safety in order to participate more fully in basic ADLs.    Baseline IE: Mod/MinA, MAX VCs and TCs for sequencing/safety; 6/15: SBA, BUE // bar support, Min/Mod VCs for sequencing; 7/27: SBA, BUE // bar support, mod encouragement for participation    Time 12    Period Weeks    Status Achieved      PT LONG TERM GOAL #4   Title Patient will demonstrate improved B knee extension ROM as evidenced by lacking < 10 degrees of B knee extension PROM/AROM in order to achieve standing posture with greater independence.    Baseline IE: B lacking 15-20 degrees of knee extension; 6/15: R 0 degrees, L lacking 5 degrees    Time 12    Period Weeks    Status Achieved      PT LONG TERM GOAL #5   Title Patient will demonstrate improved static standing balance as evidenced by ability to maintain standing posture with CGA/SBA, SUE support for 4 minutes in order to participate in basic ADLs.    Baseline IE: 90 sec, MinA, BUE support; 6/15: 2.5 min, CGA, BUE support; 7/27: 4 min, SBA, BUE support; 9/13: 2 min, CGA, SUE support; 10/18: 15 min during ADLs, BUE support, CGA; in clinic 4 min SUE with occasional BUE support, CGA    Time 8    Period Weeks    Status On-going    Target Date 09/18/20      PT LONG TERM GOAL #6   Title Patient will be able to ambulate with RW ~ 25 feet, CGA and VCs for improved ability to participate in ADLs within the home.    Baseline 7/27: // bars 6 feet, SBA, max VCs/encouragement; 9/13: // bars, 18 ft,  SBA/CGA, min to no VCs/encouragement; 10/18: // bars, 25 ft, SBA/CGA, min VCs    Time 8    Period Weeks    Status On-going    Target Date 09/18/20                  Patient will benefit from skilled therapeutic intervention in order to improve the following deficits and impairments:     Visit  Diagnosis: Decreased range of motion (ROM) of both knees  Muscle weakness (generalized)  Difficulty in walking, not elsewhere classified     Problem List Patient Active Problem List   Diagnosis Date Noted  . Normocytic anemia 04/26/2020  . Encounter for hospice care discussion   . MRSA bacteremia 02/17/2020  . Streptococcal bacteremia 02/17/2020  . Bacteremia 02/17/2020  . History of breast cancer 02/17/2020  . Goals of care, counseling/discussion   . Palliative care by specialist   . DNR (do not resuscitate) discussion   . Breast pain, right 01/11/2020  . Femoral neck fracture ([place]) 06/27/2019  . Femur fracture, left ([place]) 06/26/2019  . Right lower lobe pulmonary nodule 05/04/2019  . Syncope 09/27/2017  . Atypical chest pain 09/03/2017  . Essential hypertension 09/03/2017  . Short-term memory loss 09/03/2017  . Osteoporosis 04/02/2016  . Breast cancer, right [facility]) 04/06/2008   Myles Gip PT, DPT [phone]  08/21/2020, 12:49 PM  [place] [facility] [address] [place], [place], [address] Phone: [phone]   Fax:  [phone]  Name: MULAN ADAN MRN: [id number] Date of Birth: Aug 04, 1935

## 2020-08-23 ENCOUNTER — Encounter: Payer: Self-pay | Admitting: Physical Therapy

## 2020-08-23 ENCOUNTER — Other Ambulatory Visit: Payer: Self-pay

## 2020-08-23 ENCOUNTER — Ambulatory Visit: Payer: Medicare HMO | Admitting: Physical Therapy

## 2020-08-23 DIAGNOSIS — M25662 Stiffness of left knee, not elsewhere classified: Secondary | ICD-10-CM

## 2020-08-23 DIAGNOSIS — R262 Difficulty in walking, not elsewhere classified: Secondary | ICD-10-CM

## 2020-08-23 DIAGNOSIS — M25661 Stiffness of right knee, not elsewhere classified: Secondary | ICD-10-CM

## 2020-08-23 DIAGNOSIS — M6281 Muscle weakness (generalized): Secondary | ICD-10-CM

## 2020-08-23 NOTE — Therapy (Signed)
[place] [facility] [address]. [place], [place], [address] Phone: [phone]   Fax:  [phone]  Physical Therapy Treatment Physical Therapy Progress Note   Dates of reporting period  07/03/2020   to   08/23/2020   Patient Details  Name: Cynthia Walker MRN: [id number] Date of Birth: 06-17-1935 Referring Provider (PT): Feldpausch   Encounter Date: 08/23/2020   PT End of Session - 08/23/20 1248    Visit Number 40    Number of Visits 52    Date for PT Re-Evaluation 09/18/20    PT Start Time 1109    PT Stop Time 1209    PT Time Calculation (min) 60 min    Equipment Utilized During Treatment Gait belt    Activity Tolerance Other (comment);Patient limited by fatigue;Patient limited by pain   Tx limited 2/2 to attention span and emotional lability   Behavior During Therapy [facility] for tasks assessed/performed   emotionally labile at times          Past Medical History:  Diagnosis Date   Breast cancer [facility]) 2009   Right breast CA with lumpectomy, radiation and chemo tx's.   Dementia ([place])    Hypertension    Personal history of chemotherapy    Personal history of radiation therapy     Past Surgical History:  Procedure Laterality Date   BREAST BIOPSY Right 03/15/2008   invasive mammary carcinoma   BREAST LUMPECTOMY Right 04/14/2008   Pathology revealed a 1.1 cm grade I invasive ductal carcinoma. Margins were negative.    COLONOSCOPY WITH PROPOFOL N/A 07/10/2015   Procedure: COLONOSCOPY WITH PROPOFOL;  Surgeon: Hulen Luster, MD;  Location: [facility] ENDOSCOPY;  Service: Gastroenterology;  Laterality: N/A;   INTRAMEDULLARY (IM) NAIL INTERTROCHANTERIC Left 06/27/2019   Procedure: INTRAMEDULLARY (IM) NAIL INTERTROCHANTRIC;  Surgeon: Creig Hines, MD;  Location: [facility] ORS;  Service: Orthopedics;  Laterality: Left;   TONSILLECTOMY     age of 85 years old    There were no vitals filed for this visit.   Subjective Assessment -  08/23/20 1246    Subjective Patient reports increased pain in RLE, but cannot articulate location or aggravating factors. Patient's spouse notes that good relief was possible last night with application of a pain relieving linament. Patient somewhat drowsy today but more participatory in history taking.    Patient is accompained by: Family member   husband, [name]   Pertinent History Femur fracture, left (CMS-HCC) 06/26/2019; Patient was ambulatory without AD prior to the fall that resulted in fx. Patient's spouse notes that she was capable of walking ~0.5 miles prior to the fall. Patient's spouse reports that she can at present stand for up to 10 min, but not consistently and not without assistance. While working with HHPT patient was able to ambulate with a RW for ~ 200 feet with rest breaks throughout.    Limitations Lifting;Standing;Walking;House hold activities    How long can you sit comfortably? Unlimited    How long can you stand comfortably? 5-10 min with support    How long can you walk comfortably? < 200 feet    Patient Stated Goals 2/2 to cognitive state, patient did not state speicifc goals. Spouse would like to see her ambulatory within the house.    Currently in Pain? Yes    Pain Score --   Patient cannot state pain level.           TREATMENT  Neuromuscular Re-education: // bars: STS, BUE support  x5, Min A, max encouragment and reorientation to task Static standing, BUE, SBA, x6 min, x4 min with occasional verbal cues and encouragement to walk Gait with BUE support, SBA/CGA, x1 length of // bars, min VCs for sequencing and orientation to task. Patient with good sequencing and reciprocal UE movement. Reporting pain and grimacing with increased steps. Unable to fully assess goals 2/2 to pain level and intermittent drowsiness/level of arousal and cognitive state.  Patient educated throughout session on appropriate technique and form using multi-modal cueing, HEP, and activity  modification. Patient articulated understanding and returned demonstration.  Patient Response to interventions: Patient asleep in wheelchair.  ASSESSMENT Patient presents to clinic drowsy but willing to participate in therapy. Patient demonstrates deficits in cognition, posture, BLE strength, gait, balance, and function. Patient requiring maximum encouragement from DPT and caregiver to participate during today's session and responded minimally to encouragement. Patient did demonstrate increased stride length with LLE and required limited cueing for length of stride. Patient's condition has the potential to improve in response to therapy. Maximum improvement is yet to be obtained. The anticipated improvement is attainable and reasonable in a generally predictable time. Patient will benefit from continued skilled therapeutic intervention to address deficits in cognition, posture, BLE strength, gait, balance, and function in order to increase function and improve overall QOL.    PT Long Term Goals - 08/23/20 1253      PT LONG TERM GOAL #1   Title Patient will be modified independent with spousal support with HEP for improved strength and balance in order to decrease fall risk and dependence at home and in the community.    Baseline IE: not demonstrated; 6/15: good cargegiver participation and understanding    Time 12    Period Weeks    Status Achieved      PT LONG TERM GOAL #2   Title Patient will demonstrate improved function as evidenced by a score of 44 on FOTO measure for full participation in activities at home and in the community.    Baseline IE: 16; 6/15: deferred 2/2 to technical issues; 7/27: 38; 11/17: not collected 2/2 to level of arousal    Time 8    Period Weeks    Status On-going    Target Date 09/18/20      PT LONG TERM GOAL #3   Title Patient will perform STS with BUE support with Modified IND/SBA with VCs for sequencing and safety in order to participate more fully in basic  ADLs.    Baseline IE: Mod/MinA, MAX VCs and TCs for sequencing/safety; 6/15: SBA, BUE // bar support, Min/Mod VCs for sequencing; 7/27: SBA, BUE // bar support, mod encouragement for participation    Time 12    Period Weeks    Status Achieved      PT LONG TERM GOAL #4   Title Patient will demonstrate improved B knee extension ROM as evidenced by lacking < 10 degrees of B knee extension PROM/AROM in order to achieve standing posture with greater independence.    Baseline IE: B lacking 15-20 degrees of knee extension; 6/15: R 0 degrees, L lacking 5 degrees    Time 12    Period Weeks    Status Achieved      PT LONG TERM GOAL #5   Title Patient will demonstrate improved static standing balance as evidenced by ability to maintain standing posture with CGA/SBA, SUE support for 4 minutes in order to participate in basic ADLs.    Baseline IE: 90 sec,  MinA, BUE support; 6/15: 2.5 min, CGA, BUE support; 7/27: 4 min, SBA, BUE support; 9/13: 2 min, CGA, SUE support; 10/18: 15 min during ADLs, BUE support, CGA; in clinic 4 min SUE with occasional BUE support, CGA; 11/17: BUE support, 6 min, SBA    Time 8    Period Weeks    Status On-going    Target Date 09/18/20      PT LONG TERM GOAL #6   Title Patient will be able to ambulate with RW ~ 25 feet, CGA and VCs for improved ability to participate in ADLs within the home.    Baseline 7/27: // bars 6 feet, SBA, max VCs/encouragement; 9/13: // bars, 18 ft, SBA/CGA, min to no VCs/encouragement; 10/18: // bars, 25 ft, SBA/CGA, min VCs; 11/17: // bars, x10 ft, SBA/CGA, min VCs    Time 8    Period Weeks    Status On-going    Target Date 09/18/20                 Plan - 08/23/20 1249    Clinical Impression Statement Patient presents to clinic drowsy but willing to participate in therapy. Patient demonstrates deficits in cognition, posture, BLE strength, gait, balance, and function. Patient requiring maximum encouragement from DPT and caregiver to  participate during today's session and responded minimally to encouragement. Patient did demonstrate increased stride length with LLE and required limited cueing for length of stride. Patient's condition has the potential to improve in response to therapy. Maximum improvement is yet to be obtained. The anticipated improvement is attainable and reasonable in a generally predictable time. Patient will benefit from continued skilled therapeutic intervention to address deficits in cognition, posture, BLE strength, gait, balance, and function in order to increase function and improve overall QOL.    Personal Factors and Comorbidities Age;Education;Comorbidity 3+;Past/Current Experience;Time since onset of injury/illness/exacerbation;Fitness;Transportation;Other   Cognitive State   Comorbidities anemia, breast cancer, chronic constipation, diverticulosis, hypertension, hyperlipidemia, osteoporosis    Examination-Activity Limitations Bathing;Dressing;Transfers;Hygiene/Grooming;Bed Mobility;Squat;Lift;Bend;Locomotion Level;Stairs;Reach Overhead;Stand;Toileting;Carry    Examination-Participation Restrictions Church;Interpersonal Relationship;Personal Finances;Yard Work;Cleaning;Laundry;Community Activity;Medication Management;Shop;Driving;Meal Prep    Stability/Clinical Decision Making Evolving/Moderate complexity    Rehab Potential Fair    PT Frequency 2x / week    PT Duration 8 weeks    PT Treatment/Interventions Moist Heat;DME Instruction;Therapeutic activities;Functional mobility training;Stair training;Gait training;Therapeutic exercise;Balance training;Neuromuscular re-education;Cognitive remediation;Orthotic Fit/Training;Patient/family education;Manual techniques;Scar mobilization;Passive range of motion;Energy conservation;Taping    PT Next Visit Plan // bar gait    PT Home Exercise Plan hamstring stretch; supported standing for ADLs    Consulted and Agree with Plan of Care Patient;Family member/caregiver     Family Member Consulted Allean Montfort, husband           Patient will benefit from skilled therapeutic intervention in order to improve the following deficits and impairments:  Abnormal gait, Decreased balance, Decreased endurance, Decreased mobility, Difficulty walking, Hypomobility, Postural dysfunction, Improper body mechanics, Increased edema, Decreased range of motion, Decreased cognition, Decreased activity tolerance, Decreased coordination, Decreased safety awareness, Decreased strength, Impaired flexibility  Visit Diagnosis: Decreased range of motion (ROM) of both knees  Muscle weakness (generalized)  Difficulty in walking, not elsewhere classified     Problem List Patient Active Problem List   Diagnosis Date Noted   Normocytic anemia 04/26/2020   Encounter for hospice care discussion    MRSA bacteremia 02/17/2020   Streptococcal bacteremia 02/17/2020   Bacteremia 02/17/2020   History of breast cancer 02/17/2020   Goals of care, counseling/discussion    Palliative care  by specialist    DNR (do not resuscitate) discussion    Breast pain, right 01/11/2020   Femoral neck fracture ([place]) 06/27/2019   Femur fracture, left ([place]) 06/26/2019   Right lower lobe pulmonary nodule 05/04/2019   Syncope 09/27/2017   Atypical chest pain 09/03/2017   Essential hypertension 09/03/2017   Short-term memory loss 09/03/2017   Osteoporosis 04/02/2016   Breast cancer, right ([place]) 04/06/2008   Myles Gip PT, DPT [phone]  08/23/2020, 12:55 PM  [place] [facility] [address] [place], [place], [address] Phone: [phone]   Fax:  [phone]  Name: MARCEA ROJEK MRN: [id number] Date of Birth: 1935/03/31

## 2020-08-28 ENCOUNTER — Other Ambulatory Visit: Payer: Self-pay

## 2020-08-28 ENCOUNTER — Ambulatory Visit: Payer: Medicare HMO | Admitting: Physical Therapy

## 2020-08-28 ENCOUNTER — Encounter: Payer: Self-pay | Admitting: Physical Therapy

## 2020-08-28 DIAGNOSIS — M25661 Stiffness of right knee, not elsewhere classified: Secondary | ICD-10-CM | POA: Diagnosis not present

## 2020-08-28 DIAGNOSIS — M6281 Muscle weakness (generalized): Secondary | ICD-10-CM

## 2020-08-28 DIAGNOSIS — R262 Difficulty in walking, not elsewhere classified: Secondary | ICD-10-CM

## 2020-08-28 NOTE — Therapy (Signed)
[facility] [address] [place], [place], [address] Phone: [phone]   Fax:  [phone]  Physical Therapy Treatment  Patient Details  Name: Cynthia Walker MRN: [id number] Date of Birth: [birth year] Referring Provider (PT): Feldpausch   Encounter Date: 08/28/2020   PT End of Session - 08/28/20 1206    Visit Number 41    Number of Visits 52    Date for PT Re-Evaluation 09/18/20    PT Start Time 1104    PT Stop Time 1201    PT Time Calculation (min) 57 min    Equipment Utilized During Treatment Gait belt    Activity Tolerance Other (comment);Patient limited by fatigue   Tx limited 2/2 to attention span and emotional lability   Behavior During Therapy [facility] for tasks assessed/performed   emotionally labile at times          Past Medical History:  Diagnosis Date  . Breast cancer [facility]) 2009   Right breast CA with lumpectomy, radiation and chemo tx's.  . Dementia ([place])   . Hypertension   . Personal history of chemotherapy   . Personal history of radiation therapy     Past Surgical History:  Procedure Laterality Date  . BREAST BIOPSY Right 03/15/2008   invasive mammary carcinoma  . BREAST LUMPECTOMY Right 04/14/2008   Pathology revealed a 1.1 cm grade I invasive ductal carcinoma. Margins were negative.   . COLONOSCOPY WITH PROPOFOL N/A 07/10/2015   Procedure: COLONOSCOPY WITH PROPOFOL;  Surgeon: Hulen Luster, MD;  Location: [facility] ENDOSCOPY;  Service: Gastroenterology;  Laterality: N/A;  . INTRAMEDULLARY (IM) NAIL INTERTROCHANTERIC Left 06/27/2019   Procedure: INTRAMEDULLARY (IM) NAIL INTERTROCHANTRIC;  Surgeon: Creig Hines, MD;  Location: [facility] ORS;  Service: Orthopedics;  Laterality: Left;  . TONSILLECTOMY     age of 85 years old    There were no vitals filed for this visit.   Subjective Assessment - 08/28/20 1204    Subjective Patient and spouse report continued good ability to stand for hygiene at home with grab  bar support. Patient's spouse notes patient continues to complain of RLE pain and he offers that she has been having "rod like" spasms in her lags. Patient alert for session today.    Patient is accompained by: Family member   husband, Cynthia Walker   Pertinent History Femur fracture, left (CMS-HCC) 06/26/2019; Patient was ambulatory without AD prior to the fall that resulted in fx. Patient's spouse notes that she was capable of walking ~0.5 miles prior to the fall. Patient's spouse reports that she can at present stand for up to 10 min, but not consistently and not without assistance. While working with HHPT patient was able to ambulate with a RW for ~ 200 feet with rest breaks throughout.    Limitations Lifting;Standing;Walking;House hold activities    How long can you sit comfortably? Unlimited    How long can you stand comfortably? 5-10 min with support    How long can you walk comfortably? < 200 feet    Patient Stated Goals 2/2 to cognitive state, patient did not state speicifc goals. Spouse would like to see her ambulatory within the house.    Currently in Pain? No/denies           TREATMENT  Neuromuscular Re-education: // bars: STS, BUE support x5, Min A, max encouragment and reorientation to task Static standing, BUE, SBA, 2x4 min with occasional verbal cues and encouragement to walk Gait with BUE support, SBA/CGA, x2  length of // bars, min VCs for sequencing and orientation to task. Patient benefiting from intermittent Min A at B lateral hips for decreased Trendelenburg.  Patient educated throughout session on appropriate technique and form using multi-modal cueing, HEP, and activity modification. Patient articulated understanding and returned demonstration.  Patient Response to interventions: Patient states "I thought this was over."  ASSESSMENT Patient presents to clinic drowsy but willing to participate in therapy. Patient demonstrates deficits in cognition, posture, BLE strength, gait,  balance, and function. Patient with increased stride length occasionally with parallel bar gait activities during today's session and responded moderately to encouragement.  Patient will benefit from continued skilled therapeutic intervention to address deficits in cognition, posture, BLE strength, gait, balance, and function in order to increase function and improve overall QOL.      PT Long Term Goals - 08/23/20 1253      PT LONG TERM GOAL #1   Title Patient will be modified independent with spousal support with HEP for improved strength and balance in order to decrease fall risk and dependence at home and in the community.    Baseline IE: not demonstrated; 6/15: good cargegiver participation and understanding    Time 12    Period Weeks    Status Achieved      PT LONG TERM GOAL #2   Title Patient will demonstrate improved function as evidenced by a score of 44 on FOTO measure for full participation in activities at home and in the community.    Baseline IE: 16; 6/15: deferred 2/2 to technical issues; 7/27: 38; 11/17: not collected 2/2 to level of arousal    Time 8    Period Weeks    Status On-going    Target Date 09/18/20      PT LONG TERM GOAL #3   Title Patient will perform STS with BUE support with Modified IND/SBA with VCs for sequencing and safety in order to participate more fully in basic ADLs.    Baseline IE: Mod/MinA, MAX VCs and TCs for sequencing/safety; 6/15: SBA, BUE // bar support, Min/Mod VCs for sequencing; 7/27: SBA, BUE // bar support, mod encouragement for participation    Time 12    Period Weeks    Status Achieved      PT LONG TERM GOAL #4   Title Patient will demonstrate improved B knee extension ROM as evidenced by lacking < 10 degrees of B knee extension PROM/AROM in order to achieve standing posture with greater independence.    Baseline IE: B lacking 15-20 degrees of knee extension; 6/15: R 0 degrees, L lacking 5 degrees    Time 12    Period Weeks     Status Achieved      PT LONG TERM GOAL #5   Title Patient will demonstrate improved static standing balance as evidenced by ability to maintain standing posture with CGA/SBA, SUE support for 4 minutes in order to participate in basic ADLs.    Baseline IE: 90 sec, MinA, BUE support; 6/15: 2.5 min, CGA, BUE support; 7/27: 4 min, SBA, BUE support; 9/13: 2 min, CGA, SUE support; 10/18: 15 min during ADLs, BUE support, CGA; in clinic 4 min SUE with occasional BUE support, CGA; 11/17: BUE support, 6 min, SBA    Time 8    Period Weeks    Status On-going    Target Date 09/18/20      PT LONG TERM GOAL #6   Title Patient will be able to ambulate with RW ~ 25  feet, CGA and VCs for improved ability to participate in ADLs within the home.    Baseline 7/27: // bars 6 feet, SBA, max VCs/encouragement; 9/13: // bars, 18 ft, SBA/CGA, min to no VCs/encouragement; 10/18: // bars, 25 ft, SBA/CGA, min VCs; 11/17: // bars, x10 ft, SBA/CGA, min VCs    Time 8    Period Weeks    Status On-going    Target Date 09/18/20                 Plan - 08/28/20 1206    Clinical Impression Statement Patient presents to clinic drowsy but willing to participate in therapy. Patient demonstrates deficits in cognition, posture, BLE strength, gait, balance, and function. Patient with increased stride length occasionally with parallel bar gait activities during today's session and responded moderately to encouragement.  Patient will benefit from continued skilled therapeutic intervention to address deficits in cognition, posture, BLE strength, gait, balance, and function in order to increase function and improve overall QOL.    Personal Factors and Comorbidities Age;Education;Comorbidity 3+;Past/Current Experience;Time since onset of injury/illness/exacerbation;Fitness;Transportation;Other   Cognitive State   Comorbidities anemia, breast cancer, chronic constipation, diverticulosis, hypertension, hyperlipidemia, osteoporosis     Examination-Activity Limitations Bathing;Dressing;Transfers;Hygiene/Grooming;Bed Mobility;Squat;Lift;Bend;Locomotion Level;Stairs;Reach Overhead;Stand;Toileting;Carry    Examination-Participation Restrictions Church;Interpersonal Relationship;Personal Finances;Yard Work;Cleaning;Laundry;Community Activity;Medication Management;Shop;Driving;Meal Prep    Stability/Clinical Decision Making Evolving/Moderate complexity    Rehab Potential Fair    PT Frequency 2x / week    PT Duration 8 weeks    PT Treatment/Interventions Moist Heat;DME Instruction;Therapeutic activities;Functional mobility training;Stair training;Gait training;Therapeutic exercise;Balance training;Neuromuscular re-education;Cognitive remediation;Orthotic Fit/Training;Patient/family education;Manual techniques;Scar mobilization;Passive range of motion;Energy conservation;Taping    PT Next Visit Plan // bar gait    PT Home Exercise Plan hamstring stretch; supported standing for ADLs    Consulted and Agree with Plan of Care Patient;Family member/caregiver    Family Member Consulted Cynthia Walker, husband           Patient will benefit from skilled therapeutic intervention in order to improve the following deficits and impairments:  Abnormal gait, Decreased balance, Decreased endurance, Decreased mobility, Difficulty walking, Hypomobility, Postural dysfunction, Improper body mechanics, Increased edema, Decreased range of motion, Decreased cognition, Decreased activity tolerance, Decreased coordination, Decreased safety awareness, Decreased strength, Impaired flexibility  Visit Diagnosis: Muscle weakness (generalized)  Decreased range of motion (ROM) of both knees  Difficulty in walking, not elsewhere classified     Problem List Patient Active Problem List   Diagnosis Date Noted  . Normocytic anemia 04/26/2020  . Encounter for hospice care discussion   . MRSA bacteremia 02/17/2020  . Streptococcal bacteremia 02/17/2020  .  Bacteremia 02/17/2020  . History of breast cancer 02/17/2020  . Goals of care, counseling/discussion   . Palliative care by specialist   . DNR (do not resuscitate) discussion   . Breast pain, right 01/11/2020  . Femoral neck fracture ([place]) 06/27/2019  . Femur fracture, left ([place]) 06/26/2019  . Right lower lobe pulmonary nodule 05/04/2019  . Syncope 09/27/2017  . Atypical chest pain 09/03/2017  . Essential hypertension 09/03/2017  . Short-term memory loss 09/03/2017  . Osteoporosis 04/02/2016  . Breast cancer, right [facility]) 04/06/2008   Myles Gip PT, DPT [phone]  08/28/2020, 12:10 PM  [place] [facility] [address] [place], [place], [address] Phone: [phone]   Fax:  [phone]  Name: Cynthia Walker MRN: [id number] Date of Birth: 1935-03-17

## 2020-08-30 ENCOUNTER — Ambulatory Visit: Payer: Medicare HMO | Admitting: Physical Therapy

## 2020-09-01 ENCOUNTER — Other Ambulatory Visit: Payer: Self-pay

## 2020-09-01 ENCOUNTER — Encounter: Payer: Self-pay | Admitting: Emergency Medicine

## 2020-09-01 ENCOUNTER — Inpatient Hospital Stay
Admission: EM | Admit: 2020-09-01 | Discharge: 2020-09-12 | DRG: 391 | Disposition: A | Discharge status: Skilled Nursing Facility | Payer: Medicare HMO | Attending: Internal Medicine | Admitting: Internal Medicine

## 2020-09-01 ENCOUNTER — Emergency Department: Payer: Medicare HMO

## 2020-09-01 DIAGNOSIS — K5900 Constipation, unspecified: Principal | ICD-10-CM | POA: Diagnosis present

## 2020-09-01 DIAGNOSIS — Z91040 Latex allergy status: Secondary | ICD-10-CM

## 2020-09-01 DIAGNOSIS — Z888 Allergy status to other drugs, medicaments and biological substances status: Secondary | ICD-10-CM

## 2020-09-01 DIAGNOSIS — Z681 Body mass index (BMI) 19 or less, adult: Secondary | ICD-10-CM

## 2020-09-01 DIAGNOSIS — D649 Anemia, unspecified: Secondary | ICD-10-CM | POA: Diagnosis present

## 2020-09-01 DIAGNOSIS — Z66 Do not resuscitate: Secondary | ICD-10-CM | POA: Diagnosis present

## 2020-09-01 DIAGNOSIS — R531 Weakness: Secondary | ICD-10-CM

## 2020-09-01 DIAGNOSIS — Z8249 Family history of ischemic heart disease and other diseases of the circulatory system: Secondary | ICD-10-CM

## 2020-09-01 DIAGNOSIS — Z79899 Other long term (current) drug therapy: Secondary | ICD-10-CM

## 2020-09-01 DIAGNOSIS — Z880 Allergy status to penicillin: Secondary | ICD-10-CM

## 2020-09-01 DIAGNOSIS — R1084 Generalized abdominal pain: Secondary | ICD-10-CM | POA: Diagnosis not present

## 2020-09-01 DIAGNOSIS — Z853 Personal history of malignant neoplasm of breast: Secondary | ICD-10-CM

## 2020-09-01 DIAGNOSIS — E876 Hypokalemia: Secondary | ICD-10-CM | POA: Diagnosis present

## 2020-09-01 DIAGNOSIS — Z9221 Personal history of antineoplastic chemotherapy: Secondary | ICD-10-CM

## 2020-09-01 DIAGNOSIS — Z993 Dependence on wheelchair: Secondary | ICD-10-CM

## 2020-09-01 DIAGNOSIS — Z923 Personal history of irradiation: Secondary | ICD-10-CM

## 2020-09-01 DIAGNOSIS — F0390 Unspecified dementia without behavioral disturbance: Secondary | ICD-10-CM | POA: Diagnosis present

## 2020-09-01 DIAGNOSIS — E440 Moderate protein-calorie malnutrition: Secondary | ICD-10-CM | POA: Diagnosis present

## 2020-09-01 DIAGNOSIS — I10 Essential (primary) hypertension: Secondary | ICD-10-CM | POA: Diagnosis present

## 2020-09-01 DIAGNOSIS — Z803 Family history of malignant neoplasm of breast: Secondary | ICD-10-CM

## 2020-09-01 DIAGNOSIS — Z801 Family history of malignant neoplasm of trachea, bronchus and lung: Secondary | ICD-10-CM

## 2020-09-01 DIAGNOSIS — Z20822 Contact with and (suspected) exposure to covid-19: Secondary | ICD-10-CM | POA: Diagnosis present

## 2020-09-01 DIAGNOSIS — R109 Unspecified abdominal pain: Secondary | ICD-10-CM | POA: Diagnosis present

## 2020-09-01 DIAGNOSIS — G9341 Metabolic encephalopathy: Secondary | ICD-10-CM | POA: Diagnosis present

## 2020-09-01 LAB — URINALYSIS, COMPLETE (UACMP) WITH MICROSCOPIC
Bilirubin Urine: NEGATIVE
Glucose, UA: NEGATIVE mg/dL
Hgb urine dipstick: NEGATIVE
Ketones, ur: NEGATIVE mg/dL
Leukocytes,Ua: NEGATIVE
Nitrite: NEGATIVE
Protein, ur: NEGATIVE mg/dL
Specific Gravity, Urine: 1.028 (ref 1.005–1.030)
pH: 9 — ABNORMAL HIGH (ref 5.0–8.0)

## 2020-09-01 LAB — COMPREHENSIVE METABOLIC PANEL
ALT: 12 U/L (ref 0–44)
AST: 14 U/L — ABNORMAL LOW (ref 15–41)
Albumin: 2.5 g/dL — ABNORMAL LOW (ref 3.5–5.0)
Alkaline Phosphatase: 62 U/L (ref 38–126)
Anion gap: 9 (ref 5–15)
BUN: 8 mg/dL (ref 8–23)
CO2: 22 mmol/L (ref 22–32)
Calcium: 7.4 mg/dL — ABNORMAL LOW (ref 8.9–10.3)
Chloride: 106 mmol/L (ref 98–111)
Creatinine, Ser: 0.43 mg/dL — ABNORMAL LOW (ref 0.44–1.00)
GFR, Estimated: >60 mL/min (ref >60)
Glucose, Bld: 94 mg/dL (ref 70–99)
Potassium: 3.4 mmol/L — ABNORMAL LOW (ref 3.5–5.1)
Sodium: 137 mmol/L (ref 135–145)
Total Bilirubin: 0.6 mg/dL (ref 0.3–1.2)
Total Protein: 5.6 g/dL — ABNORMAL LOW (ref 6.5–8.1)

## 2020-09-01 LAB — CBC WITH DIFFERENTIAL/PLATELET
Abs Immature Granulocytes: 0.03 10*3/uL (ref 0.00–0.07)
Basophils Absolute: 0 10*3/uL (ref 0.0–0.1)
Basophils Relative: 0 %
Eosinophils Absolute: 0 10*3/uL (ref 0.0–0.5)
Eosinophils Relative: 0 %
HCT: 30.3 % — ABNORMAL LOW (ref 36.0–46.0)
Hemoglobin: 9.4 g/dL — ABNORMAL LOW (ref 12.0–15.0)
Immature Granulocytes: 0 %
Lymphocytes Relative: 10 %
Lymphs Abs: 0.8 10*3/uL (ref 0.7–4.0)
MCH: 29.2 pg (ref 26.0–34.0)
MCHC: 31 g/dL (ref 30.0–36.0)
MCV: 94.1 fL (ref 80.0–100.0)
Monocytes Absolute: 0.5 10*3/uL (ref 0.1–1.0)
Monocytes Relative: 7 %
Neutro Abs: 6.3 10*3/uL (ref 1.7–7.7)
Neutrophils Relative %: 83 %
Platelets: 338 10*3/uL (ref 150–400)
RBC: 3.22 MIL/uL — ABNORMAL LOW (ref 3.87–5.11)
RDW: 14.1 % (ref 11.5–15.5)
WBC: 7.7 10*3/uL (ref 4.0–10.5)
nRBC: 0 % (ref 0.0–0.2)

## 2020-09-01 LAB — PHOSPHORUS: Phosphorus: 2.6 mg/dL (ref 2.5–4.6)

## 2020-09-01 LAB — MAGNESIUM: Magnesium: 1.8 mg/dL (ref 1.7–2.4)

## 2020-09-01 MED ORDER — POTASSIUM CHLORIDE IN NACL 40-0.9 MEQ/L-% IV SOLN
INTRAVENOUS | Status: AC
  Filled 2020-09-01: qty 1000

## 2020-09-01 MED ORDER — IOHEXOL 300 MG/ML  SOLN
75.0000 mL | Freq: Once | INTRAMUSCULAR | Status: AC | PRN
  Administered 2020-09-01: 75 mL via INTRAVENOUS

## 2020-09-01 MED ORDER — ACETAMINOPHEN 325 MG PO TABS
650.0000 mg | ORAL_TABLET | Freq: Four times a day (QID) | ORAL | Status: DC | PRN
  Administered 2020-09-02 – 2020-09-06 (×2): 650 mg via ORAL
  Filled 2020-09-01 (×3): qty 2

## 2020-09-01 MED ORDER — MAGNESIUM SULFATE 2 GM/50ML IV SOLN
2.0000 g | Freq: Once | INTRAVENOUS | Status: AC
  Administered 2020-09-01: 2 g via INTRAVENOUS
  Filled 2020-09-01: qty 50

## 2020-09-01 MED ORDER — ONDANSETRON HCL 4 MG/2ML IJ SOLN: 4.0000 mg | Freq: Four times a day (QID) | INTRAMUSCULAR | Status: DC | PRN

## 2020-09-01 MED ORDER — SENNOSIDES-DOCUSATE SODIUM 8.6-50 MG PO TABS
1.0000 | ORAL_TABLET | Freq: Two times a day (BID) | ORAL | Status: DC
  Administered 2020-09-01 – 2020-09-12 (×11): 1 via ORAL
  Filled 2020-09-01 (×21): qty 1

## 2020-09-01 MED ORDER — ACETAMINOPHEN 650 MG RE SUPP
650.0000 mg | Freq: Four times a day (QID) | RECTAL | Status: DC | PRN
  Filled 2020-09-01: qty 1

## 2020-09-01 MED ORDER — ENOXAPARIN SODIUM 30 MG/0.3ML ~~LOC~~ SOLN: 30.0000 mg | SUBCUTANEOUS | Status: DC

## 2020-09-01 MED ORDER — POLYETHYLENE GLYCOL 3350 17 G PO PACK
34.0000 g | PACK | Freq: Once | ORAL | Status: AC
  Administered 2020-09-01: 34 g via ORAL
  Filled 2020-09-01: qty 2

## 2020-09-01 MED ORDER — MAGNESIUM OXIDE 400 (241.3 MG) MG PO TABS
200.0000 mg | ORAL_TABLET | Freq: Every day | ORAL | Status: DC
  Administered 2020-09-02 – 2020-09-12 (×8): 200 mg via ORAL
  Filled 2020-09-01 (×10): qty 1

## 2020-09-01 MED ORDER — ENOXAPARIN SODIUM 40 MG/0.4ML ~~LOC~~ SOLN
40.0000 mg | SUBCUTANEOUS | Status: DC
  Administered 2020-09-01 – 2020-09-11 (×10): 40 mg via SUBCUTANEOUS
  Filled 2020-09-01 (×11): qty 0.4

## 2020-09-01 MED ORDER — ONDANSETRON HCL 4 MG PO TABS: 4.0000 mg | ORAL_TABLET | Freq: Four times a day (QID) | ORAL | Status: DC | PRN

## 2020-09-01 MED ORDER — BISACODYL 10 MG RE SUPP
10.0000 mg | Freq: Every day | RECTAL | Status: DC | PRN
  Filled 2020-09-01: qty 1

## 2020-09-01 NOTE — ED Notes (Signed)
Admit MD at bedside

## 2020-09-01 NOTE — H&P (Signed)
History and Physical    MYLDRED RAJU MWU:[id number] DOB: 1935/07/31 DOA: 09/01/2020  PCP: Sofie Hartigan, MD   Patient coming from: Home.   I have personally briefly reviewed patient's old medical records in [place]  Chief Complaint: Generalized weakness/abdominal pain/constipation.  HPI: Cynthia Walker is a 85 y.o. female with medical history significant of breast cancer with history of right breast lumpectomy, radiation and chemotherapy, dementia, hypertension who is brought to the emergency department from home via EMS due to complaints of generalized weakness, abdominal pain and constipation.  Her husband, who is her caregiver at home stated that she was recently treated for an UTI and has to strong smell of urine.  The patient's husband states that she has been constipated over the past 2 weeks.  Her constipation has being on though for control with occasional MiraLAX, but this has not been effective over the last 2 weeks.  That the patient's appetite has decreased, but no difficulty sleeping and no significant changes in behavior.  Per husband, there has not been fever, productive cough, emesis, diarrhea, lower extremity edema or any other symptoms to his knowledge.  ED Course: Initial vital signs were temperature 98.6 F, pulse 85, respirations 21, BP 144/83 mmHg O2 sat 96% on room air.  MiraLAX 17 g p.o. ordered in the emergency department.  Labwork: Urinalysis was yellow in color, cloudy in appearance with a pH of 9.0 and rare bacteria microscopic examination.  All other urinalysis values were within expected range.  CBC showed a white count 7.7, hemoglobin 9.4 g/dL and platelets 338.  CMP showed a potassium of 3.4 mmol/L.  Normal glucose, unremarkable renal function.  Calcium is 7.4 mg/dL, but albumin was 2.5 and total protein 5.6 g/dL.  The rest of the hepatic functions are unremarkable.  Imaging: CT abdomen/pelvis with contrast did not show any acute  abdominopelvic findings.  There was a wedge shaped opacity within the periphery of the right middle lobe of approximately 2.5 x 1.4 cm which has increased in size since April of this year.  The lateral right fifth rib appears to be eroded and is concerning for malignancy.  Contrast-enhanced chest CT is recommended.  Also there is moderate stool volume in the colon, primarily diverticulosis without diverticulitis.  Mildly dilated on the lateral and pelvic veins.  There is likely postlumpectomy changes within the inferior aspects of the right breast that can become related with a recent mammogram.  There was aortic atherosclerosis.  Please see images and full radiology report for further detail.  Review of Systems: As per HPI otherwise all other systems reviewed and are negative.  Past Medical History:  Diagnosis Date  . Breast cancer [facility]) 2009   Right breast CA with lumpectomy, radiation and chemo tx's.  . Dementia ([place])   . Hypertension   . Personal history of chemotherapy   . Personal history of radiation therapy    Past Surgical History:  Procedure Laterality Date  . BREAST BIOPSY Right 03/15/2008   invasive mammary carcinoma  . BREAST LUMPECTOMY Right 04/14/2008   Pathology revealed a 1.1 cm grade I invasive ductal carcinoma. Margins were negative.   . COLONOSCOPY WITH PROPOFOL N/A 07/10/2015   Procedure: COLONOSCOPY WITH PROPOFOL;  Surgeon: Hulen Luster, MD;  Location: [facility] ENDOSCOPY;  Service: Gastroenterology;  Laterality: N/A;  . INTRAMEDULLARY (IM) NAIL INTERTROCHANTERIC Left 06/27/2019   Procedure: INTRAMEDULLARY (IM) NAIL INTERTROCHANTRIC;  Surgeon: Creig Hines, MD;  Location: [facility] ORS;  Service: Orthopedics;  Laterality:  Left;  . TONSILLECTOMY     age of 85 years old   Social History  reports that she has never smoked. She has never used smokeless tobacco. She reports that she does not drink alcohol and does not use drugs.  Allergies  Allergen Reactions  . Flu Virus Vaccine      Other reaction(s): Other (See Comments) Stomach cramps  . Latex Swelling  . Other     Other reaction(s): Unknown Allergy to eggs  . Procaine Other (See Comments)    weakness  . Soy Isoflavones     Other reaction(s): Other (See Comments) Soy causes legs to feel like rubber  . Penicillins Rash    Has patient had a PCN reaction causing immediate rash, facial/tongue/throat swelling, SOB or lightheadedness with hypotension: No Has patient had a PCN reaction causing severe rash involving mucus membranes or skin necrosis: No Has patient had a PCN reaction that required hospitalization: No Has patient had a PCN reaction occurring within the last 10 years: No If all of the above answers are "NO", then may proceed with Cephalosporin use.    Family History  Problem Relation Age of Onset  . Cancer Brother        lung cancer  . Cancer Sister        not sure  . Breast cancer Sister   . Heart disease Father   . Hypertension Father    Prior to Admission medications   Medication Sig Start Date End Date Taking? Authorizing Provider  donepezil (ARICEPT) 5 MG tablet Take 5 mg by mouth daily. 02/07/20   [provider]  Multiple Vitamin (MULTI-VITAMINS) TABS Take by mouth.    [provider]  QUEtiapine (SEROQUEL) 25 MG tablet Take 25 mg by mouth daily. At 4 pm. Compounded into suspension    [provider]  sertraline (ZOLOFT) 50 MG tablet Take 75 mg by mouth daily.  Patient not taking: Reported on 05/22/2020 02/09/20   [provider]    Physical Exam: Vitals:   09/01/20 0910 09/01/20 0915 09/01/20 1130 09/01/20 1200  BP: (!) 144/83  (!) 149/90 (!) 157/86  Pulse: 85     Resp: (!) 21  (!) 23 19  Temp: 98.6 F (37 C)     TempSrc: Oral     SpO2: 96%   98%  Weight:  59.4 kg    Height:  5\' 7"  (1.702 m)      Constitutional: Frail, elderly female.  NAD, calm, comfortable Eyes: PERRL, lids and conjunctivae normal ENMT: Mucous membranes are mildly dry.  Posterior pharynx clear of any exudate or lesions. Neck: normal, supple, no masses, no thyromegaly Respiratory: clear to auscultation bilaterally, no wheezing, no crackles. Normal respiratory effort. No accessory muscle use.  Cardiovascular: Regular rate and rhythm, no murmurs / rubs / gallops. No extremity edema. 2+ pedal pulses. No carotid bruits.  Abdomen: Nondistended.  Soft, no tenderness, no masses palpated. No hepatosplenomegaly. Bowel sounds positive.  Musculoskeletal: no clubbing / cyanosis. Good ROM, no contractures. Normal muscle tone.  Skin: no rashes, lesions, ulcers on very limited dermatological examination. Neurologic: CN 2-12 grossly intact. Sensation intact, DTR normal. Strength 5/5 in all 4.  Psychiatric: Alert and oriented x1, partially oriented to place, disoriented to time/date and situation.  Labs on Admission: I have personally reviewed following labs and imaging studies  CBC: Recent Labs  Lab 09/01/20 0928  WBC 7.7  NEUTROABS 6.3  HGB 9.4*  HCT 30.3*  MCV 94.1  PLT 338  Basic Metabolic Panel: Recent Labs  Lab 09/01/20 0928  NA 137  K 3.4*  CL 106  CO2 22  GLUCOSE 94  BUN 8  CREATININE 0.43*  CALCIUM 7.4*    GFR: Estimated Creatinine Clearance: 50 mL/min (A) (by C-G formula based on SCr of 0.43 mg/dL (L)).  Liver Function Tests: Recent Labs  Lab 09/01/20 0928  AST 14*  ALT 12  ALKPHOS 62  BILITOT 0.6  PROT 5.6*  ALBUMIN 2.5*   Radiological Exams on Admission: CT ABDOMEN PELVIS W CONTRAST  Result Date: 09/01/2020 CLINICAL DATA:  Acute abdominal pain, constipation EXAM: CT ABDOMEN AND PELVIS WITH CONTRAST TECHNIQUE: Multidetector CT imaging of the abdomen and pelvis was performed using the standard protocol following bolus administration of intravenous contrast. CONTRAST:  61mL OMNIPAQUE IOHEXOL 300 MG/ML  SOLN COMPARISON:  04/04/2015, 01/14/2020 FINDINGS: Technical note: Examination is mildly degraded by motion artifact as well as beam  hardening artifact related to the positioning of the right arm positioned over the abdomen. Lower chest: Wedge-shaped opacity within the periphery of the right middle lobe, measuring approximately 2.5 x 1.4 cm (series 4, image 3), which has increased in size from 01/14/2020. The adjacent lateral right fifth rib appears to be eroded (series 2, images 3-4). Left basilar atelectasis. Heart size within normal limits. Likely postlumpectomy changes within the inferior aspect of the right breast. Hepatobiliary: No interval change in appearance of 2 low-density lesions within the liver. Liver appears otherwise within normal limits. Motion degraded appearance of the gallbladder. No evidence for gallstone or inflammation. Pancreas: Grossly unremarkable. Spleen: Normal in size without focal abnormality. Adrenals/Urinary Tract: Unremarkable adrenal glands. Simple 4 cm interpolar cyst within the right kidney. The kidneys have otherwise symmetric enhancement. No renal stone or hydronephrosis. Urinary bladder is within normal limits. Stomach/Bowel: Stomach is within normal limits. Diverticulosis within the sigmoid colon. No evidence of bowel wall thickening, distention, or inflammatory changes. Moderate volume of stool within the colon. Vascular/Lymphatic: Aortoiliac atherosclerotic calcifications without aneurysm. Mildly dilated gonadal and pelvic veins, left greater than right. No abdominopelvic lymphadenopathy. Reproductive: Uterus and bilateral adnexa are unremarkable. Other: No free fluid. No abdominopelvic fluid collection. No pneumoperitoneum. No abdominal wall hernia. Musculoskeletal: Prior left hip ORIF.  No acute osseous findings. IMPRESSION: 1. No acute abdominopelvic findings. 2. Wedge-shaped opacity within the periphery of the right middle lobe, measuring approximately 2.5 x 1.4 cm, increased in size from 01/14/2020. The adjacent lateral right fifth rib appears to be eroded. Findings are concerning for malignancy.  Further evaluation with dedicated contrast enhanced chest CT is recommended. 3. Moderate volume of stool within the colon. 4. Sigmoid diverticulosis without evidence of acute diverticulitis. 5. Mildly dilated gonadal and pelvic veins, left greater than right, which can be seen in the setting of pelvic congestion syndrome. 6. Likely postlumpectomy changes within the inferior aspect of the right breast. Correlate with recent mammogram. 7. Aortic atherosclerosis. Electronically Signed   By: Davina Poke D.O.   On: 09/01/2020 11:21    EKG: Independently reviewed.  Vent. rate 85 BPM PR interval * ms QRS duration 91 ms QT/QTc 370/440 ms P-R-T axes 71 36 7 Sinus rhythm Low voltage, precordial leads Baseline wander in lead(s) V5 V6  Assessment/Plan Principal Problem:   Abdominal pain Observation/telemetry. N.p.o. at this time. Analgesics as needed. Antiemetics as needed. Constipation relief Trial of clear liquids.  Active Problems:   Constipation Gentle/time-limited IV hydration. Optimize electrolytes. Continue MiraLAX. May stool softeners.    Hypokalemia Replacing. Add-on magnesium level. Magnesium was  supplemented. Add on phosphorus level. Follow-up potassium level.    Normocytic anemia Normal anemia panel 4 months ago. Relatively low iron levels. Repeat iron studies. Monitor H&H. Transfuse as needed.    Dementia ([place])   Moderate protein malnutrition (HCC) Protein supplementation. Consult nutritional services.    Essential hypertension Not on antihypertensives at this time. Monitor blood pressure.   DVT prophylaxis: Lovenox SQ. Code Status:   DNR. Family Communication:  Georgeanna Radziewicz [phone]) Disposition Plan:   Patient is from:  Home.  Anticipated DC to:  Home.  Anticipated DC date:  09/02/2020.  Anticipated DC barriers: Clinical status.  Consults called:  TOC team. Admission status:  Observation/MedSurg-telemetry.   Severity of Illness: High due  to constipation of about 2 weeks associated with generalized weakness and inability to assist with self mobility/activities of daily living.  Her husband is the only caregiver available at home and it would be unsafe to discharge her home at this point.  Reubin Milan MD [facility]  How to contact the [facility] Attending or Consulting provider [place] or covering provider during after hours [place], for this patient?   1. Check the care team in [facility] and look for a) attending/consulting TR[facility] provider listed and b) the [facility] team listed 2. Log into [email or web address] and use [facility]'s universal password to access. If you do not have the password, please contact the hospital operator. 3. Locate the [facility] provider you are looking for under [facility] and page to a number that you can be directly reached. 4. If you still have difficulty reaching the provider, please page the [facility] (Director on Call) for the Hospitalists listed on amion for assistance.  09/01/2020, 12:56 PM   This document was prepared using Dragon voice recognition software and may contain some unintended transcription errors.

## 2020-09-01 NOTE — ED Notes (Signed)
Linens changed. Clergy at bedside

## 2020-09-01 NOTE — Progress Notes (Signed)
Anticoagulation monitoring(Lovenox):  85yo  F ordered Lovenox 30 mg Q24h  Filed Weights   09/01/20 0915  Weight: 59.4 kg (130 lb 15.3 oz)   BMI 20.5   Lab Results  Component Value Date   CREATININE 0.43 (L) 09/01/2020   CREATININE 0.52 02/28/2020   CREATININE 0.47 02/19/2020   Estimated Creatinine Clearance: 50 mL/min (A) (by C-G formula based on SCr of 0.43 mg/dL (L)). Hemoglobin & Hematocrit     Component Value Date/Time   HGB 9.4 (L) 09/01/2020 0928   HGB 14.4 11/25/2013 1031   HCT 30.3 (L) 09/01/2020 0928   HCT 42.4 11/25/2013 1031     Per Protocol for Patient with estCrcl > 30 ml/min and BMI < 40, will transition to Lovenox 40 mg Q24h      Chinita Greenland PharmD Clinical Pharmacist 09/01/2020

## 2020-09-01 NOTE — ED Provider Notes (Signed)
[facility] Emergency Department Provider Note ____________________________________________   First MD Initiated Contact with Patient 09/01/20 [phone]     (approximate)  I have reviewed the triage vital signs and the nursing notes.  HISTORY  Chief Complaint Abdominal Pain and Constipation   HPI Cynthia Walker is a 85 y.o. femalewho presents to the ED for evaluation of abdominal pain and constipation.  Chart review indicates history of HTN, right breast cancer s/p lumpectomy, radiation and chemo remotely.  Dementia.  Lives at home and husband is caregiver.  Husband at the bedside provides all history as patient is unable to provide any due to her baseline dementia.  Husband reports that patient has been declining in a subacute timeframe with worsening weakness and associated constipation.  He elaborates on his routines at home when transferring her.  She is typically wheelchair-bound, occasionally getting up with a walker to stand with transfers.  Husband reports patient has not stood for the past 3-4 days due to generalized weakness.  Husband reports that patient's last bowel movement was 1 week ago and was large, firm and constipated.  Patient has continued to tolerate p.o. intake without emesis or diarrhea, but has not passed a bowel movement in many days.  Husband reports patient finished a course of ciprofloxacin for UTI 1 week ago.  Husband reports patient was crying and generalized abdominal pain this morning, which is why he called 911 for evaluation.  Patient has no complaints and is pleasantly disoriented.   Past Medical History:  Diagnosis Date  . Breast cancer [facility]) 2009   Right breast CA with lumpectomy, radiation and chemo tx's.  . Dementia ([place])   . Hypertension   . Personal history of chemotherapy   . Personal history of radiation therapy     Patient Active Problem List   Diagnosis Date Noted  . Abdominal pain 09/01/2020  .  Normocytic anemia 04/26/2020  . Encounter for hospice care discussion   . MRSA bacteremia 02/17/2020  . Streptococcal bacteremia 02/17/2020  . Bacteremia 02/17/2020  . History of breast cancer 02/17/2020  . Goals of care, counseling/discussion   . Palliative care by specialist   . DNR (do not resuscitate) discussion   . Breast pain, right 01/11/2020  . Femoral neck fracture ([place]) 06/27/2019  . Femur fracture, left ([place]) 06/26/2019  . Right lower lobe pulmonary nodule 05/04/2019  . Syncope 09/27/2017  . Atypical chest pain 09/03/2017  . Essential hypertension 09/03/2017  . Short-term memory loss 09/03/2017  . Osteoporosis 04/02/2016  . Breast cancer, right ([place]) 04/06/2008    Past Surgical History:  Procedure Laterality Date  . BREAST BIOPSY Right 03/15/2008   invasive mammary carcinoma  . BREAST LUMPECTOMY Right 04/14/2008   Pathology revealed a 1.1 cm grade I invasive ductal carcinoma. Margins were negative.   . COLONOSCOPY WITH PROPOFOL N/A 07/10/2015   Procedure: COLONOSCOPY WITH PROPOFOL;  Surgeon: Hulen Luster, MD;  Location: [facility] ENDOSCOPY;  Service: Gastroenterology;  Laterality: N/A;  . INTRAMEDULLARY (IM) NAIL INTERTROCHANTERIC Left 06/27/2019   Procedure: INTRAMEDULLARY (IM) NAIL INTERTROCHANTRIC;  Surgeon: Creig Hines, MD;  Location: [facility] ORS;  Service: Orthopedics;  Laterality: Left;  . TONSILLECTOMY     age of 85 years old    Prior to Admission medications   Medication Sig Start Date End Date Taking? Authorizing Provider  donepezil (ARICEPT) 5 MG tablet Take 5 mg by mouth daily. 02/07/20   [provider]  Multiple Vitamin (MULTI-VITAMINS) TABS Take by mouth.  [provider]  QUEtiapine (SEROQUEL) 25 MG tablet Take 25 mg by mouth daily. At 4 pm. Compounded into suspension    [provider]  sertraline (ZOLOFT) 50 MG tablet Take 75 mg by mouth daily.  Patient not taking: Reported on 05/22/2020 02/09/20   [provider]     Allergies Flu virus vaccine, Latex, Other, Procaine, Soy isoflavones, and Penicillins  Family History  Problem Relation Age of Onset  . Cancer Brother        lung cancer  . Cancer Sister        not sure  . Breast cancer Sister   . Heart disease Father   . Hypertension Father     Social History Social History   Tobacco Use  . Smoking status: Never Smoker  . Smokeless tobacco: Never Used  Substance Use Topics  . Alcohol use: No  . Drug use: No    Review of Systems  Unable to be accurately assessed due to patient's baseline dementia ____________________________________________   PHYSICAL EXAM:  VITAL SIGNS: Vitals:   09/01/20 1130 09/01/20 1200  BP: (!) 149/90 (!) 157/86  Pulse:    Resp: (!) 23 19  Temp:    SpO2:  98%     Constitutional: Alert and oriented. Well appearing and in no acute distress. Eyes: Conjunctivae are normal. PERRL. EOMI. Head: Atraumatic. Nose: No congestion/rhinnorhea. Mouth/Throat: Mucous membranes are moist.  Oropharynx non-erythematous. Neck: No stridor. No cervical spine tenderness to palpation. Cardiovascular: Normal rate, regular rhythm. Grossly normal heart sounds.  Good peripheral circulation. Respiratory: Normal respiratory effort.  No retractions. Lungs CTAB. Gastrointestinal: Firm and nontender throughout.  No CVA tenderness bilaterally. Musculoskeletal: No lower extremity tenderness nor edema.  No joint effusions. No signs of acute trauma. Neurologic: No gross focal neurologic deficits are appreciated.  Skin:  Skin is warm, dry and intact. No rash noted. Psychiatric: Mood and affect are normal. Speech and behavior are normal.  ____________________________________________   LABS (all labs ordered are listed, but only abnormal results are displayed)  Labs Reviewed  CBC WITH DIFFERENTIAL/PLATELET - Abnormal; Notable for the following components:      Result Value   RBC 3.22 (*)    Hemoglobin 9.4 (*)    HCT 30.3 (*)     All other components within normal limits  COMPREHENSIVE METABOLIC PANEL - Abnormal; Notable for the following components:   Potassium 3.4 (*)    Creatinine, Ser 0.43 (*)    Calcium 7.4 (*)    Total Protein 5.6 (*)    Albumin 2.5 (*)    AST 14 (*)    All other components within normal limits  URINALYSIS, COMPLETE (UACMP) WITH MICROSCOPIC - Abnormal; Notable for the following components:   Color, Urine YELLOW (*)    APPearance CLOUDY (*)    pH 9.0 (*)    Bacteria, UA RARE (*)    All other components within normal limits   ____________________________________________  12 Lead EKG  Sinus rhythm, rate of 85 bpm.  Normal axis and intervals.  Evidence of acute ischemia. ____________________________________________  RADIOLOGY  ED MD interpretation: CT abdomen/pelvis reviewed by me with evidence of moderate colonic stool burden  Official radiology report(s): CT ABDOMEN PELVIS W CONTRAST  Result Date: 09/01/2020 CLINICAL DATA:  Acute abdominal pain, constipation EXAM: CT ABDOMEN AND PELVIS WITH CONTRAST TECHNIQUE: Multidetector CT imaging of the abdomen and pelvis was performed using the standard protocol following bolus administration of intravenous contrast. CONTRAST:  63mL OMNIPAQUE IOHEXOL 300 MG/ML  SOLN COMPARISON:  04/04/2015, 01/14/2020 FINDINGS: Technical note: Examination is mildly degraded by motion artifact as well as beam hardening artifact related to the positioning of the right arm positioned over the abdomen. Lower chest: Wedge-shaped opacity within the periphery of the right middle lobe, measuring approximately 2.5 x 1.4 cm (series 4, image 3), which has increased in size from 01/14/2020. The adjacent lateral right fifth rib appears to be eroded (series 2, images 3-4). Left basilar atelectasis. Heart size within normal limits. Likely postlumpectomy changes within the inferior aspect of the right breast. Hepatobiliary: No interval change in appearance of 2 low-density lesions  within the liver. Liver appears otherwise within normal limits. Motion degraded appearance of the gallbladder. No evidence for gallstone or inflammation. Pancreas: Grossly unremarkable. Spleen: Normal in size without focal abnormality. Adrenals/Urinary Tract: Unremarkable adrenal glands. Simple 4 cm interpolar cyst within the right kidney. The kidneys have otherwise symmetric enhancement. No renal stone or hydronephrosis. Urinary bladder is within normal limits. Stomach/Bowel: Stomach is within normal limits. Diverticulosis within the sigmoid colon. No evidence of bowel wall thickening, distention, or inflammatory changes. Moderate volume of stool within the colon. Vascular/Lymphatic: Aortoiliac atherosclerotic calcifications without aneurysm. Mildly dilated gonadal and pelvic veins, left greater than right. No abdominopelvic lymphadenopathy. Reproductive: Uterus and bilateral adnexa are unremarkable. Other: No free fluid. No abdominopelvic fluid collection. No pneumoperitoneum. No abdominal wall hernia. Musculoskeletal: Prior left hip ORIF.  No acute osseous findings. IMPRESSION: 1. No acute abdominopelvic findings. 2. Wedge-shaped opacity within the periphery of the right middle lobe, measuring approximately 2.5 x 1.4 cm, increased in size from 01/14/2020. The adjacent lateral right fifth rib appears to be eroded. Findings are concerning for malignancy. Further evaluation with dedicated contrast enhanced chest CT is recommended. 3. Moderate volume of stool within the colon. 4. Sigmoid diverticulosis without evidence of acute diverticulitis. 5. Mildly dilated gonadal and pelvic veins, left greater than right, which can be seen in the setting of pelvic congestion syndrome. 6. Likely postlumpectomy changes within the inferior aspect of the right breast. Correlate with recent mammogram. 7. Aortic atherosclerosis. Electronically Signed   By: Davina Poke D.O.   On: 09/01/2020 11:21     ____________________________________________   PROCEDURES and INTERVENTIONS  Procedure(s) performed (including Critical Care):  .1-3 Lead EKG Interpretation Performed by: Vladimir Crofts, MD Authorized by: Vladimir Crofts, MD     Interpretation: normal     ECG rate:  80   ECG rate assessment: normal     Rhythm: sinus rhythm     Ectopy: none     Conduction: normal      Medications  polyethylene glycol (MIRALAX / GLYCOLAX) packet 34 g (has no administration in time range)  iohexol (OMNIPAQUE) 300 MG/ML solution 75 mL (75 mLs Intravenous Contrast Given 09/01/20 1040)    ____________________________________________   MDM / ED COURSE   85 year old woman presents from home with progressively worsening generalized weakness with constipation and evidence of new malignancy, requiring medical admission.  Normal vitals on room air.  Exam with a pleasantly demented patient who has some vague diffuse abdominal discomfort without localizing or peritoneal features.  She has no signs of distress, trauma or neurovascular deficits.  Blood work is largely unremarkable, but does demonstrate evidence of protein calorie malnutrition.  CT abdomen/pelvis with IV contrast obtained due to tenderness and constipation to rule out SBO, and does not show evidence of such.  She has a moderate colonic stool burden without evidence of impaction or SBO.  She does have a  partially visualized pathology to her right chest concerning for malignancy.  Due to her contrast load and age, repeat CT was not obtained in the ED but I spoke with the admitting hospitalist about this.  Husband reports inability to care for the patient at home and patient has no safe discharge plan at this time.  Help facilitate treatment of her constipation, physical therapy and imaging of her chest, will admit the patient to hospitalist medicine for further work-up and management.   Clinical Course as of Sep 01 1246  Fri Sep 01, 2020  1226  Return to the bedside and educated patient and husband on CT results.  We discussed the possibility of malignancy.  We discussed the need for repeat CT imaging of her chest at some point.   [DS]  1227 We discussed lack of safe discharge plan as patient's husband is unable to care for her at home.  They are agreeable to medical admission.   [DS]    Clinical Course User Index [DS] Vladimir Crofts, MD    ____________________________________________   FINAL CLINICAL IMPRESSION(S) / ED DIAGNOSES  Final diagnoses:  Constipation, unspecified constipation type  Generalized weakness     ED Discharge Orders    None       Awesome Jared Tamala Julian   Note:  This document was prepared using Dragon voice recognition software and may include unintentional dictation errors.   Vladimir Crofts, MD 09/01/20 1250

## 2020-09-01 NOTE — Plan of Care (Signed)
  Problem: Clinical Measurements: Goal: Ability to maintain clinical measurements within normal limits will improve Outcome: Progressing   Problem: Clinical Measurements: Goal: Respiratory complications will improve Outcome: Progressing   Problem: Clinical Measurements: Goal: Will remain free from infection Outcome: Progressing   Problem: Coping: Goal: Level of anxiety will decrease Outcome: Progressing   Problem: Safety: Goal: Ability to remain free from injury will improve Outcome: Progressing

## 2020-09-01 NOTE — ED Triage Notes (Signed)
Pt to ER via EMS from home with c/o abdominal pain and constipation.  Pt was recently treated for UTI and has strong smell of urine.

## 2020-09-02 DIAGNOSIS — Z20822 Contact with and (suspected) exposure to covid-19: Secondary | ICD-10-CM | POA: Diagnosis present

## 2020-09-02 DIAGNOSIS — Z79899 Other long term (current) drug therapy: Secondary | ICD-10-CM | POA: Diagnosis not present

## 2020-09-02 DIAGNOSIS — E440 Moderate protein-calorie malnutrition: Secondary | ICD-10-CM | POA: Diagnosis present

## 2020-09-02 DIAGNOSIS — R1084 Generalized abdominal pain: Secondary | ICD-10-CM | POA: Diagnosis not present

## 2020-09-02 DIAGNOSIS — Z681 Body mass index (BMI) 19 or less, adult: Secondary | ICD-10-CM | POA: Diagnosis not present

## 2020-09-02 DIAGNOSIS — Z7189 Other specified counseling: Secondary | ICD-10-CM | POA: Diagnosis not present

## 2020-09-02 DIAGNOSIS — Z66 Do not resuscitate: Secondary | ICD-10-CM | POA: Diagnosis present

## 2020-09-02 DIAGNOSIS — G9341 Metabolic encephalopathy: Secondary | ICD-10-CM | POA: Diagnosis present

## 2020-09-02 DIAGNOSIS — Z801 Family history of malignant neoplasm of trachea, bronchus and lung: Secondary | ICD-10-CM | POA: Diagnosis not present

## 2020-09-02 DIAGNOSIS — E876 Hypokalemia: Secondary | ICD-10-CM | POA: Diagnosis present

## 2020-09-02 DIAGNOSIS — Z993 Dependence on wheelchair: Secondary | ICD-10-CM | POA: Diagnosis not present

## 2020-09-02 DIAGNOSIS — Z803 Family history of malignant neoplasm of breast: Secondary | ICD-10-CM | POA: Diagnosis not present

## 2020-09-02 DIAGNOSIS — Z880 Allergy status to penicillin: Secondary | ICD-10-CM | POA: Diagnosis not present

## 2020-09-02 DIAGNOSIS — Z923 Personal history of irradiation: Secondary | ICD-10-CM | POA: Diagnosis not present

## 2020-09-02 DIAGNOSIS — D649 Anemia, unspecified: Secondary | ICD-10-CM | POA: Diagnosis present

## 2020-09-02 DIAGNOSIS — K5900 Constipation, unspecified: Secondary | ICD-10-CM | POA: Diagnosis present

## 2020-09-02 DIAGNOSIS — Z515 Encounter for palliative care: Secondary | ICD-10-CM | POA: Diagnosis not present

## 2020-09-02 DIAGNOSIS — Z91040 Latex allergy status: Secondary | ICD-10-CM | POA: Diagnosis not present

## 2020-09-02 DIAGNOSIS — Z888 Allergy status to other drugs, medicaments and biological substances status: Secondary | ICD-10-CM | POA: Diagnosis not present

## 2020-09-02 DIAGNOSIS — F0390 Unspecified dementia without behavioral disturbance: Secondary | ICD-10-CM | POA: Diagnosis present

## 2020-09-02 DIAGNOSIS — Z853 Personal history of malignant neoplasm of breast: Secondary | ICD-10-CM | POA: Diagnosis not present

## 2020-09-02 DIAGNOSIS — R531 Weakness: Secondary | ICD-10-CM | POA: Diagnosis present

## 2020-09-02 DIAGNOSIS — Z8249 Family history of ischemic heart disease and other diseases of the circulatory system: Secondary | ICD-10-CM | POA: Diagnosis not present

## 2020-09-02 DIAGNOSIS — Z9221 Personal history of antineoplastic chemotherapy: Secondary | ICD-10-CM | POA: Diagnosis not present

## 2020-09-02 DIAGNOSIS — I10 Essential (primary) hypertension: Secondary | ICD-10-CM | POA: Diagnosis present

## 2020-09-02 LAB — BASIC METABOLIC PANEL
Anion gap: 9 (ref 5–15)
BUN: <5 mg/dL — ABNORMAL LOW (ref 8–23)
CO2: 25 mmol/L (ref 22–32)
Calcium: 8.6 mg/dL — ABNORMAL LOW (ref 8.9–10.3)
Chloride: 101 mmol/L (ref 98–111)
Creatinine, Ser: 0.47 mg/dL (ref 0.44–1.00)
GFR, Estimated: >60 mL/min (ref >60)
Glucose, Bld: 121 mg/dL — ABNORMAL HIGH (ref 70–99)
Potassium: 4 mmol/L (ref 3.5–5.1)
Sodium: 135 mmol/L (ref 135–145)

## 2020-09-02 LAB — CBC
HCT: 34.4 % — ABNORMAL LOW (ref 36.0–46.0)
Hemoglobin: 11.3 g/dL — ABNORMAL LOW (ref 12.0–15.0)
MCH: 29.5 pg (ref 26.0–34.0)
MCHC: 32.8 g/dL (ref 30.0–36.0)
MCV: 89.8 fL (ref 80.0–100.0)
Platelets: 404 10*3/uL — ABNORMAL HIGH (ref 150–400)
RBC: 3.83 MIL/uL — ABNORMAL LOW (ref 3.87–5.11)
RDW: 13.5 % (ref 11.5–15.5)
WBC: 8.3 10*3/uL (ref 4.0–10.5)
nRBC: 0 % (ref 0.0–0.2)

## 2020-09-02 MED ORDER — BISACODYL 5 MG PO TBEC
5.0000 mg | DELAYED_RELEASE_TABLET | Freq: Every day | ORAL | Status: DC
  Administered 2020-09-02 – 2020-09-12 (×5): 5 mg via ORAL
  Filled 2020-09-02 (×8): qty 1

## 2020-09-02 MED ORDER — MAGNESIUM CITRATE PO SOLN
1.0000 | Freq: Once | ORAL | Status: AC
  Administered 2020-09-02: 1 via ORAL
  Filled 2020-09-02: qty 296

## 2020-09-02 MED ORDER — POTASSIUM CHLORIDE IN NACL 40-0.9 MEQ/L-% IV SOLN
INTRAVENOUS | Status: AC
  Filled 2020-09-02: qty 1000

## 2020-09-02 NOTE — Progress Notes (Signed)
PROGRESS NOTE   Cynthia Walker  BUL:[id number] DOB: Jul 14, 1935 DOA: 09/01/2020 PCP: Sofie Hartigan, MD  Brief Narrative:  85 year old home dwelling [place] Right breast cancer with lumpectomy 2009 status post XRT, chemo HTN, moderate dementia came to ED with generalized weakness abdominal pain and severe constipation X 2 weeks Recent Rx for UTI WBC 7 hemoglobin 9 potassium 3.4-CT abdomen pelvis? Right fifth rib erosion? Spread of malignancy   Timeline Admission 5/12 through 02/21/2020 2/2 concern for bacteremia ultimately felt to have colonization Admission 9/19 through 07/01/2019 hip fracture status post repair Admission 1222 09/30/2017 hypotension and echocardiogram time no abnormality telemetry normal Admission 2/8-2/07/2013 diverticulitis  Assessment & Plan:   Principal Problem:   Abdominal pain Active Problems:   Essential hypertension   Normocytic anemia   Dementia (HCC)   Moderate protein malnutrition (HCC)   Hypokalemia   Constipation   1. Recent Rx urinary infection ciprofloxacin 08/14/2020 a. Low suspicion of infection as recent treatment with Cipro completed 6 days per husband b. No further work-up as UA is negative 2. Severe constipation abdominal pain a. Trial magnesium citrate in addition to Dulcolax p.o. MiraLAX and senna b. Previously has had to be disimpacted we will try to withhold uncomfortable procedures but may be forced to do the same if no stool in the next day or so 3. Mild hypokalemia on admission a. Not eating not drinking and not really awake oriented enough to do so b. Continue NS@40 + K 40 mEq at 50 cc an hour and recheck labs in a.m. 4. Breast cancer status post lumpectomy/XRT with concerns on admission this admission for malignancy spread a. Probably has spread to rib b. unlikely good candidate for systemic therapies 5. HTN a. Not on meds and would probably withhold treatment given severe dementia 6. Moderate to severe dementia worsening with  excessive somnolence over several months per office note 08/01/2020 a. Husband relates at least 6 months progressing decline-this week has not been able to stand b. Informs so that his insurance 1 year ago hired private sitters "[employer]" and got a wheelchair accessible van- c. Informed him patient may be eligible for benefits under hospice but might not participate with therapy-they will see nonemergently d. I have asked caseworker to discuss with husband options for care  DVT prophylaxis: Lovenox Code Status: DNR confirmed at bed  Family Communication: Long discussion with husband Disposition:  Status is: Observation  The patient will require care spanning > 2 midnights and should be moved to inpatient because: Persistent severe electrolyte disturbances, Altered mental status and Unsafe d/c plan  Dispo: The patient is from: Home              Anticipated d/c is to: Unclear at this time              Anticipated d/c date is: 2 days              Patient currently is not medically stable to d/c.  (Not a safe discharge)   Consultants:   None yet  Procedures: No  Antimicrobials: None   Subjective: Not arousable opens eyes only-when I touch her feet she starts crying out No documented fevers or chills Nursing reports inability to have the patient taken mag citrate  Objective: Vitals:   09/01/20 1827 09/01/20 2051 09/02/20 0316 09/02/20 0723  BP: (!) 158/102 (!) 144/97 (!) 159/91 (!) 149/92  Pulse: 86 86 89 91  Resp: 18 17 17 16   Temp: 98.5 F (36.9 C)  99.1 F (37.3 C) 98.9 F (37.2 C) 98.3 F (36.8 C)  TempSrc: Oral Oral Oral Oral  SpO2: 100% 100% 99% 98%  Weight: 55.9 kg  55.5 kg   Height: 5\' 4"  (1.626 m)       Intake/Output Summary (Last 24 hours) at 09/02/2020 0806 Last data filed at 09/02/2020 0600 Gross per 24 hour  Intake --  Output 700 ml  Net -700 ml   Filed Weights   09/01/20 0915 09/01/20 1827 09/02/20 0316  Weight: 59.4 kg 55.9 kg 55.5 kg     Examination:   NCAT very frail eyes predominantly closed during interview Chest clear no rales no rhonchi no adventitious sounds Abdomen soft-limited exam given patient moaning and groaning with exam Neurologically is arousable but does not seem to understand--does not make much sense   Data Reviewed: I have personally reviewed following labs and imaging studies Sodium 135, potassium 3.4-->4.0 WBC 8.3 hemoglobin 11.3   Radiology Studies: CT ABDOMEN PELVIS W CONTRAST  Result Date: 09/01/2020 CLINICAL DATA:  Acute abdominal pain, constipation EXAM: CT ABDOMEN AND PELVIS WITH CONTRAST TECHNIQUE: Multidetector CT imaging of the abdomen and pelvis was performed using the standard protocol following bolus administration of intravenous contrast. CONTRAST:  24mL OMNIPAQUE IOHEXOL 300 MG/ML  SOLN COMPARISON:  04/04/2015, 01/14/2020 FINDINGS: Technical note: Examination is mildly degraded by motion artifact as well as beam hardening artifact related to the positioning of the right arm positioned over the abdomen. Lower chest: Wedge-shaped opacity within the periphery of the right middle lobe, measuring approximately 2.5 x 1.4 cm (series 4, image 3), which has increased in size from 01/14/2020. The adjacent lateral right fifth rib appears to be eroded (series 2, images 3-4). Left basilar atelectasis. Heart size within normal limits. Likely postlumpectomy changes within the inferior aspect of the right breast. Hepatobiliary: No interval change in appearance of 2 low-density lesions within the liver. Liver appears otherwise within normal limits. Motion degraded appearance of the gallbladder. No evidence for gallstone or inflammation. Pancreas: Grossly unremarkable. Spleen: Normal in size without focal abnormality. Adrenals/Urinary Tract: Unremarkable adrenal glands. Simple 4 cm interpolar cyst within the right kidney. The kidneys have otherwise symmetric enhancement. No renal stone or hydronephrosis.  Urinary bladder is within normal limits. Stomach/Bowel: Stomach is within normal limits. Diverticulosis within the sigmoid colon. No evidence of bowel wall thickening, distention, or inflammatory changes. Moderate volume of stool within the colon. Vascular/Lymphatic: Aortoiliac atherosclerotic calcifications without aneurysm. Mildly dilated gonadal and pelvic veins, left greater than right. No abdominopelvic lymphadenopathy. Reproductive: Uterus and bilateral adnexa are unremarkable. Other: No free fluid. No abdominopelvic fluid collection. No pneumoperitoneum. No abdominal wall hernia. Musculoskeletal: Prior left hip ORIF.  No acute osseous findings. IMPRESSION: 1. No acute abdominopelvic findings. 2. Wedge-shaped opacity within the periphery of the right middle lobe, measuring approximately 2.5 x 1.4 cm, increased in size from 01/14/2020. The adjacent lateral right fifth rib appears to be eroded. Findings are concerning for malignancy. Further evaluation with dedicated contrast enhanced chest CT is recommended. 3. Moderate volume of stool within the colon. 4. Sigmoid diverticulosis without evidence of acute diverticulitis. 5. Mildly dilated gonadal and pelvic veins, left greater than right, which can be seen in the setting of pelvic congestion syndrome. 6. Likely postlumpectomy changes within the inferior aspect of the right breast. Correlate with recent mammogram. 7. Aortic atherosclerosis. Electronically Signed   By: Davina Poke D.O.   On: 09/01/2020 11:21     Scheduled Meds: . enoxaparin (LOVENOX) injection  40 mg  Subcutaneous Q24H  . magnesium oxide  200 mg Oral Daily  . senna-docusate  1 tablet Oral BID   Continuous Infusions:   LOS: 0 days    Time spent: [place], MD [facility] To contact the attending provider between 7A-7P or the covering provider during after hours 7P-7A, please log into the web site [email or web address] and access using universal [place]  password for that web site. If you do not have the password, please call the hospital operator.  09/02/2020, 8:06 AM

## 2020-09-02 NOTE — Progress Notes (Signed)
Patient has not been tested for covid since admission. Notified NP Sharlet Salina, orders were placed to swab.

## 2020-09-03 DIAGNOSIS — R1084 Generalized abdominal pain: Secondary | ICD-10-CM | POA: Diagnosis not present

## 2020-09-03 LAB — COMPREHENSIVE METABOLIC PANEL
ALT: 14 U/L (ref 0–44)
AST: 15 U/L (ref 15–41)
Albumin: 2.9 g/dL — ABNORMAL LOW (ref 3.5–5.0)
Alkaline Phosphatase: 70 U/L (ref 38–126)
Anion gap: 11 (ref 5–15)
BUN: 5 mg/dL — ABNORMAL LOW (ref 8–23)
CO2: 25 mmol/L (ref 22–32)
Calcium: 8.6 mg/dL — ABNORMAL LOW (ref 8.9–10.3)
Chloride: 103 mmol/L (ref 98–111)
Creatinine, Ser: 0.49 mg/dL (ref 0.44–1.00)
GFR, Estimated: >60 mL/min (ref >60)
Glucose, Bld: 102 mg/dL — ABNORMAL HIGH (ref 70–99)
Potassium: 4.2 mmol/L (ref 3.5–5.1)
Sodium: 139 mmol/L (ref 135–145)
Total Bilirubin: 0.5 mg/dL (ref 0.3–1.2)
Total Protein: 6.4 g/dL — ABNORMAL LOW (ref 6.5–8.1)

## 2020-09-03 LAB — SARS CORONAVIRUS 2 (TAT 6-24 HRS): SARS Coronavirus 2: NEGATIVE

## 2020-09-03 LAB — CBC WITH DIFFERENTIAL/PLATELET
Abs Immature Granulocytes: 0.02 10*3/uL (ref 0.00–0.07)
Basophils Absolute: 0 10*3/uL (ref 0.0–0.1)
Basophils Relative: 0 %
Eosinophils Absolute: 0 10*3/uL (ref 0.0–0.5)
Eosinophils Relative: 0 %
HCT: 33.9 % — ABNORMAL LOW (ref 36.0–46.0)
Hemoglobin: 11.1 g/dL — ABNORMAL LOW (ref 12.0–15.0)
Immature Granulocytes: 0 %
Lymphocytes Relative: 19 %
Lymphs Abs: 1.4 10*3/uL (ref 0.7–4.0)
MCH: 29.6 pg (ref 26.0–34.0)
MCHC: 32.7 g/dL (ref 30.0–36.0)
MCV: 90.4 fL (ref 80.0–100.0)
Monocytes Absolute: 0.7 10*3/uL (ref 0.1–1.0)
Monocytes Relative: 9 %
Neutro Abs: 5.2 10*3/uL (ref 1.7–7.7)
Neutrophils Relative %: 72 %
Platelets: 422 10*3/uL — ABNORMAL HIGH (ref 150–400)
RBC: 3.75 MIL/uL — ABNORMAL LOW (ref 3.87–5.11)
RDW: 13.7 % (ref 11.5–15.5)
WBC: 7.4 10*3/uL (ref 4.0–10.5)
nRBC: 0 % (ref 0.0–0.2)

## 2020-09-03 LAB — SARS CORONAVIRUS 2 BY RT PCR (HOSPITAL ORDER, PERFORMED IN ~~LOC~~ HOSPITAL LAB): SARS Coronavirus 2: NEGATIVE

## 2020-09-03 MED ORDER — FLEET ENEMA 7-19 GM/118ML RE ENEM
1.0000 | ENEMA | Freq: Once | RECTAL | Status: AC
  Administered 2020-09-03: 1 via RECTAL

## 2020-09-03 NOTE — Progress Notes (Signed)
PROGRESS NOTE   Cynthia Walker  DJM:[id number] DOB: Nov 07, 1934 DOA: 09/01/2020 PCP: Sofie Hartigan, MD  Brief Narrative:  85 year old home dwelling [place] Right breast cancer with lumpectomy 2009 status post XRT, chemo HTN, moderate dementia came to ED with generalized weakness abdominal pain and severe constipation X 2 weeks Recent Rx for UTI WBC 7 hemoglobin 9 potassium 3.4-CT abdomen pelvis? Right fifth rib erosion? Spread of malignancy  Assessment & Plan:   Principal Problem:   Abdominal pain Active Problems:   Essential hypertension   Normocytic anemia   Dementia (HCC)   Moderate protein malnutrition (HCC)   Hypokalemia   Constipation   Metabolic encephalopathy   1. Recent Rx urinary infection ciprofloxacin 08/14/2020 a. Low suspicion of infection as recent treatment with Cipro completed 6 days per husband b. No further work-up as UA is negative 2. Severe constipation abdominal pain a. No effect with magnesium citrate in addition to Dulcolax p.o. MiraLAX and senna b. Start fleets enema and disimpacted if able 3. Mild hypokalemia on admission a. Resolved b. DC fluids and monitor trends 4. Breast cancer status post lumpectomy/XRT with concerns on admission this admission for malignancy spread a. Probably has spread to rib b. unlikely good candidate for systemic therapies 5. HTN a. Not on meds and would probably withhold treatment given severe dementia 6. Moderate to severe dementia worsening with excessive somnolence over several months per office note 08/01/2020 a. Husband relates at least 6 months progressing decline-this week has not been able to stand b. Informs so that his insurance 1 year ago hired private sitters "[employer]" and got a wheelchair accessible van- c. 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  (Not a safe discharge)   Consultants:   None yet  Procedures: No  Antimicrobials: None   Subjective:  More awake arousable speaks in simple sentences with short answers-tells me this with her husband but cannot recognize the place and then promptly closes her eyes Has not eaten breakfast Lights are off in the room and it seems like her sleep wake cycle is altered Informed by RN that preadmission Covid testing not done so she is on precautions for that reason she has no symptoms at this time of Covid  Objective: Vitals:   09/02/20 1550 09/02/20 2020 09/03/20 0420 09/03/20 0650  BP: (!) 129/97 (!) 159/110 (!) 147/92   Pulse: 94 (!) 102 89   Resp: 18 19    Temp: 98.5 F (36.9 C) 99.6 F (37.6 C) 97.6 F (36.4 C)   TempSrc: Oral Oral Oral   SpO2: 96% 94%    Weight:    54.6 kg  Height:        Intake/Output Summary (Last 24 hours) at 09/03/2020 0931 Last data filed at 09/03/2020 0252 Gross per 24 hour  Intake 635.21 ml  Output 1250 ml  Net -614.79 ml   Filed Weights   09/01/20 1827 09/02/20 0316 09/03/20 0650  Weight: 55.9 kg 55.5 kg 54.6 kg    Examination:  Awake alert pleasant no distress-some mild intermittent confusion but patient is not combative EOMI NCAT frail CTA B no added sound no rales no rhonchi Abdomen soft slight discomfort in lower quadrant with possible stool masses on exam No lower extremity  edema Power 5/5 and patient moves limbs freely   Data Reviewed: I have personally reviewed following labs and imaging studies Sodium 139 BUN/creatinine 5/0.4 WBC 7.4 hemoglobin 11.1   Radiology Studies: CT ABDOMEN PELVIS W CONTRAST  Result Date: 09/01/2020 CLINICAL DATA:  Acute abdominal pain, constipation EXAM: CT ABDOMEN AND PELVIS WITH CONTRAST TECHNIQUE: Multidetector CT imaging of the abdomen and pelvis was performed using the standard protocol following bolus administration of intravenous contrast. CONTRAST:  27mL OMNIPAQUE IOHEXOL 300 MG/ML  SOLN COMPARISON:  04/04/2015, 01/14/2020 FINDINGS: Technical note: Examination is mildly degraded by motion artifact as well as beam hardening artifact related to the positioning of the right arm positioned over the abdomen. Lower chest: Wedge-shaped opacity within the periphery of the right middle lobe, measuring approximately 2.5 x 1.4 cm (series 4, image 3), which has increased in size from 01/14/2020. The adjacent lateral right fifth rib appears to be eroded (series 2, images 3-4). Left basilar atelectasis. Heart size within normal limits. Likely postlumpectomy changes within the inferior aspect of the right breast. Hepatobiliary: No interval change in appearance of 2 low-density lesions within the liver. Liver appears otherwise within normal limits. Motion degraded appearance of the gallbladder. No evidence for gallstone or inflammation. Pancreas: Grossly unremarkable. Spleen: Normal in size without focal abnormality. Adrenals/Urinary Tract: Unremarkable adrenal glands. Simple 4 cm interpolar cyst within the right kidney. The kidneys have otherwise symmetric enhancement. No renal stone or hydronephrosis. Urinary bladder is within normal limits. Stomach/Bowel: Stomach is within normal limits. Diverticulosis within the sigmoid colon. No evidence of bowel wall thickening, distention, or inflammatory changes. Moderate volume of stool within the colon.  Vascular/Lymphatic: Aortoiliac atherosclerotic calcifications without aneurysm. Mildly dilated gonadal and pelvic veins, left greater than right. No abdominopelvic lymphadenopathy. Reproductive: Uterus and bilateral adnexa are unremarkable. Other: No free fluid. No abdominopelvic fluid collection. No pneumoperitoneum. No abdominal wall hernia. Musculoskeletal: Prior left hip ORIF.  No acute osseous findings. IMPRESSION: 1. No acute abdominopelvic findings. 2. Wedge-shaped opacity within the periphery of the right middle lobe, measuring approximately 2.5 x 1.4 cm, increased in size from 01/14/2020. The adjacent lateral right fifth rib appears to be eroded. Findings are concerning for malignancy. Further evaluation with dedicated contrast enhanced chest CT is recommended. 3. Moderate volume of stool within the colon. 4. Sigmoid diverticulosis without evidence of acute diverticulitis. 5. Mildly dilated gonadal and pelvic veins, left greater than right, which can be seen in the setting of pelvic congestion syndrome. 6. Likely postlumpectomy changes within the inferior aspect of the right breast. Correlate with recent mammogram. 7. Aortic atherosclerosis. Electronically Signed   By: Davina Poke D.O.   On: 09/01/2020 11:21     Scheduled Meds: . bisacodyl  5 mg Oral Daily  . enoxaparin (LOVENOX) injection  40 mg Subcutaneous Q24H  . magnesium oxide  200 mg Oral Daily  . senna-docusate  1 tablet Oral BID  Continuous Infusions:   LOS: 1 day    Time spent: [place], MD [facility] To contact the attending provider between 7A-7P or the covering provider during after hours 7P-7A, please log into the web site [email or web address] and access using universal [place] password for that web site. If you do not have the password, please call the hospital operator.  09/03/2020, 9:31 AM

## 2020-09-03 NOTE — Hospital Course (Addendum)
85 year old female with hx of right breast cancer with lumpectomy 2009 status post XRT, chemo HTN, moderate dementia came to ED with generalized weakness, abdominal pain and severe constipation X 2 weeks despite MiraLax at home.  Pt has become challenging for husband to care for at home since she has recently become unable to ambulate and requiring Hoyer lift for transfers.  Also with poor appetite.    Evaluation in the ED including CT abdomen/pelvis showed moderate stool volume in colon, no other acute findings.  There was however opacity in the RML with lateral right 5th rib erosion, concerning for malignancy.  Due to progressive dementia and being poor candidate for any intervention, no further evaluation was pursued.  Husband in agreement.  Patient admitted and treated with aggressive bowel regimen, with resolution of constipation.  Remains on routine stool softeners.  PO intake has improved, but remains poor.   Palliative care was consulted and current goal of care is to attempt trial of rehab to see if patient's mobility able to improve, as husband would like to be able to continue caring for her at home.  Husband has been primary caregiver and is able to care for patient very well, as long as patient has the ability to stand up so he can use hoyer lift for transfers.

## 2020-09-03 NOTE — TOC Progression Note (Signed)
Transition of Care [facility]) - Progression Note    Patient Details  Name: GUSTAVO MEDITZ MRN: [id number] Date of Birth: 14-Jan-1935  [facility]) CM/SW Contact  Izola Price, RN Phone Number: 09/03/2020, 3:36 PM  Clinical Narrative:    11/28. TOC consult written on 11/27 for possible HH/DME/SNF needs. Discharge destination undetermined due to medical condition. Husband cites 6 month decline and was using aid/sitters at home before per notes.   Attempted to reach out to husband to for assessment and to discuss potential options/possibilities, but no answer. Not medically stable per provider notes. Simmie Davies RN CM         Expected Discharge Plan and Services                                                 Social Determinants of Health (SDOH) Interventions    Readmission Risk Interventions No flowsheet data found.

## 2020-09-04 ENCOUNTER — Ambulatory Visit: Payer: Medicare HMO | Admitting: Physical Therapy

## 2020-09-04 DIAGNOSIS — R1084 Generalized abdominal pain: Secondary | ICD-10-CM | POA: Diagnosis not present

## 2020-09-04 MED ORDER — BOOST / RESOURCE BREEZE PO LIQD CUSTOM
1.0000 | Freq: Three times a day (TID) | ORAL | Status: DC
  Administered 2020-09-04 – 2020-09-12 (×18): 1 via ORAL

## 2020-09-04 NOTE — Progress Notes (Signed)
Initial Nutrition Assessment  DOCUMENTATION CODES:   Non-severe (moderate) malnutrition in context of chronic illness  INTERVENTION:   Boost Breeze po TID, each supplement provides 250 kcal and 9 grams of protein  Magic cup TID with meals, each supplement provides 290 kcal and 9 grams of protein  NUTRITION DIAGNOSIS:   Moderate Malnutrition related to chronic illness (breast cancer, dementia) as evidenced by moderate muscle depletion, moderate fat depletion.  GOAL:   Patient will meet greater than or equal to 90% of their needs  MONITOR:   PO intake, Supplement acceptance, Labs, Weight trends, I & O's, Skin  REASON FOR ASSESSMENT:   Malnutrition Screening Tool    ASSESSMENT:   85 y.o. female with medical history significant of breast cancer with history of right breast lumpectomy, radiation and chemotherapy, dementia, hypertension who is brought to the emergency department from home via EMS due to complaints of generalized weakness, abdominal pain and constipation.  Visited pt's room today. Pt is unable to provide any history related to dementia. RD suspects pt with decreased appetite and oral intake pta r/t constipation. Pt documented to have eaten 70% of her lunch tray yesterday. RD will add supplements to help pt meet her estimated needs. Of note, pt with a soy allergy; RD will order Boost Breeze. Per chart, pt is currently 8lbs(6%) below her UBW; RD unsure when weight loss occurred.   Medications reviewed and include: dulcolax, lovenox, Mg oxide, senokot  Labs reviewed: BUN 5(L)  NUTRITION - FOCUSED PHYSICAL EXAM:    Most Recent Value  Orbital Region Moderate depletion  Upper Arm Region Moderate depletion  Thoracic and Lumbar Region Mild depletion  Buccal Region Moderate depletion  Temple Region Moderate depletion  Clavicle Bone Region Moderate depletion  Clavicle and Acromion Bone Region Moderate depletion  Scapular Bone Region Moderate depletion  Dorsal Hand  Moderate depletion  Patellar Region Mild depletion  Anterior Thigh Region Mild depletion  Posterior Calf Region Mild depletion  Edema (RD Assessment) None  Hair Reviewed  Eyes Reviewed  Mouth Reviewed  Skin Reviewed  Nails Reviewed     Diet Order:   Diet Order            Diet regular Room service appropriate? Yes; Fluid consistency: Thin  Diet effective now                EDUCATION NEEDS:   No education needs have been identified at this time  Skin:  Skin Assessment: Reviewed RN Assessment  Last BM:  11/29- type 4  Height:   Ht Readings from Last 1 Encounters:  09/01/20 5\' 4"  (1.626 m)    Weight:   Wt Readings from Last 1 Encounters:  09/04/20 55.8 kg    Ideal Body Weight:  54.5 kg  BMI:  Body mass index is 21.13 kg/m.  Estimated Nutritional Needs:   Kcal:  1400-1600kcal/day  Protein:  70-80g/day  Fluid:  1.4-1.6L/day  Koleen Distance MS, RD, LDN Please refer to [facility] for RD and/or RD on-call/weekend/after hours pager

## 2020-09-04 NOTE — Evaluation (Signed)
Occupational Therapy Evaluation Patient Details Name: Cynthia Walker MRN: [id number] DOB: 04/17/1935 Today's Date: 09/04/2020    History of Present Illness Pt is an 85 y/o F With PMH: R BRCA s/p lumpectomy, XRT and Chemo 2009 as well as HTN and moderate dementia. Had IM nailing after fall to L hip 06/2019. Pt presented to ED d/t abdominal pain, weakness, and constipation x2 weeks. Low probablility of infection as pt just finished Rx for UTI. Concerns for cancer mets to R fifth rib per MD note.   Clinical Impression   Pt seen for OT Evaluation this date in acute setting d/t recent decline in mobility at home and abdominal pain. Pt's spouse present throughout. He reports that pt requires extensive assist from him with transfers to/from w/c (uses grab bar at baseline) and to/from bed (uses hoyer lift x2-3 months). He reports pt has become increasingly less able to transfer x2 weeks. He reports assisting her with bathing and dressing at baseline, but she has recently required even more assist with toileting and seated UB ADLs such as grooming. Pt presents this date grossly weak and becomes tearful with attempts to mobilize despite pain reduction and calming strategies utilized. She is minimally able to follow one step commands with increased time and multimodal cues. Pt appears somewhat anxious/anticipatory of pain before OT even attempts to engage her in mobilization. Occupational therapy team will trial patient to see if she can progress with therapy efforts while in acute setting. If so, anticipate SNF would be appropriate d/c recommendation. Discussed with MD that pt and spouse could potentially benefit from visit with palliative team. Will continue to follow at this time.     Follow Up Recommendations  SNF    Equipment Recommendations  Hospital bed    Recommendations for Other Services       Precautions / Restrictions Precautions Precautions: Fall Restrictions Weight Bearing  Restrictions: No      Mobility Bed Mobility Overal bed mobility: Needs Assistance Bed Mobility: Supine to Sit;Sit to Supine     Supine to sit: Max assist;HOB elevated Sit to supine: Total assist   General bed mobility comments: MAX A with cues to use bed rail and HOB elevated for sup to sit. Despite cues, pt does not really contribute to sit to sup.    Transfers                 General transfer comment: NT    Balance Overall balance assessment: Needs assistance;History of Falls Sitting-balance support: Bilateral upper extremity supported Sitting balance-Leahy Scale: Poor Sitting balance - Comments: requires UE support and MIN/MOD A to sustain static sit       Standing balance comment: NT                           ADL either performed or assessed with clinical judgement   ADL Overall ADL's : Needs assistance/impaired                                       General ADL Comments: Requires multimodal cues for any level of ADL participation. MAX A for bed level UB ADLs, TOTAL A for LB ADLs. MAX A for bed mobility. Transfers not assessed as pt tearful with just attempting to come to sitting with assistance of elevated HOB.     Vision   Additional Comments: difficult to  formally assess d/t cognition. Pt with moderate visual attention throughout session when given verbal/tactile cues. Pt appears to track appropriately when she is attending, but she is easily distractable.     Perception     Praxis      Pertinent Vitals/Pain Pain Assessment: Faces Faces Pain Scale: Hurts whole lot Pain Location: R side with attempts to mobilize Pain Descriptors / Indicators: Grimacing;Guarding;Crying Pain Intervention(s): Limited activity within patient's tolerance;Monitored during session;Other (comment) (notified RN of pt's pain)     Hand Dominance Right   Extremity/Trunk Assessment Upper Extremity Assessment Upper Extremity Assessment: Generalized  weakness (limited ROM b/l, apprehensive to even engage in PROM, pt clenches/tightens as if anticipating pain, making PROM difficult to guage.)   Lower Extremity Assessment Lower Extremity Assessment: Defer to PT evaluation;Generalized weakness   Cervical / Trunk Assessment Cervical / Trunk Assessment: Kyphotic   Communication Communication Communication: Other (comment) (pt with very limited verbal communications t/o session.)   Cognition Arousal/Alertness:  (drowsy, intermittent wakefullness/lethargy) Behavior During Therapy: Anxious;Flat affect (pt is flat when resting, anxious when attempting to mobilize.) Overall Cognitive Status: History of cognitive impairments - at baseline                                 General Comments: Pt is only oriented to self (with verbal cues). Only able to follow ~15% of simple one step commands with extended time. Demos poor reception of situation and poor attn to task.   General Comments       Exercises Other Exercises Other Exercises: OT facilitates ed re: role of OT in acute setting, spouse is somewhat familiar as pt attends OP PT. Pt with poor reception/capacity for education. Other Exercises: OT Engages pt in ~3 mins static EOB sitting with MIN/MOD A with calming techniques utilized to help pt become less fearful with mobilization.   Shoulder Instructions      Home Living Family/patient expects to be discharged to:: Private residence Living Arrangements: Spouse/significant other Available Help at Discharge: Family;Available 24 hours/day Type of Home: House Home Access: Ramped entrance (3 ramps, one in front that goes up and then living room is sunken in, so second ramp down into living room. In addition, ramp from garage where they park w/c accessible Lucianne Lei.)     Home Layout: One level     Bathroom Shower/Tub: [employer] aid/development worker Accessibility: Yes How Accessible: Accessible via wheelchair [place] -  single point;Bedside commode;Grab bars - toilet;Grab bars - tub/shower;Wheelchair - manual          Prior Functioning/Environment Level of Independence: Needs assistance  Gait / Transfers Assistance Needed: Husband reports that pt has progressively declined with her ability to mobilize including getting into/out of bed (now using hoyer), and declined with ability to ambulate, primarily uses w/c at baseline and has been seeing outpatient PT in [place]. ADL's / Homemaking Assistance Needed: Requires spouse's assist for bathing, dressing, and toileting. He reports that most recently, he stands her with grab bar, removes w/c and slides BSC under her and this is where she sits for sink-bath. However, spouse endorses that pt has not been able to stand to participate in this x2 weeks now.   Comments: Endorses one major fall last year requiring hip sx here.        OT Problem List: Decreased strength;Decreased range of motion;Decreased activity tolerance;Impaired balance (sitting and/or standing);Decreased cognition;Decreased knowledge of use of DME  or AE;Impaired tone;Impaired UE functional use;Pain      OT Treatment/Interventions: Self-care/ADL training;DME and/or AE instruction;Therapeutic activities;Balance training;Therapeutic exercise;Patient/family education    OT Goals(Current goals can be found in the care plan section) Acute Rehab OT Goals Patient Stated Goal: to get her stronger so she's better able to help me move her OT Goal Formulation: With family Time For Goal Achievement: 09/18/20 Potential to Achieve Goals: Fair ADL Goals Pt Will Perform Eating: with min assist;sitting (supported sitting) Pt Will Perform Grooming: with min assist;sitting (supported sitting) Pt/caregiver will Perform Home Exercise Program: Increased ROM;Increased strength;Both right and left upper extremity;With minimal assist Additional ADL Goal #1: Pt will tolerate ~5-6 mins EOB sitting with assist to increase  fxl sitting tolerance.  OT Frequency: Min 1X/week   Barriers to D/C:            Co-evaluation              AM-PAC OT "6 Clicks" Daily Activity     Outcome Measure Help from another person eating meals?: A Lot Help from another person taking care of personal grooming?: A Lot Help from another person toileting, which includes using toliet, bedpan, or urinal?: Total Help from another person bathing (including washing, rinsing, drying)?: Total Help from another person to put on and taking off regular upper body clothing?: Total Help from another person to put on and taking off regular lower body clothing?: Total 6 Click Score: 8   End of Session Nurse Communication: Mobility status;Other (comment) (notified of pain, notified of potential)  Activity Tolerance: Patient limited by pain (limited by cognition) Patient left: in bed;with call bell/phone within reach;with bed alarm set;with family/visitor present (husband present throughout)  OT Visit Diagnosis: Unsteadiness on feet (R26.81);Muscle weakness (generalized) (M62.81);History of falling (Z91.81)                Time: 9444-6190 OT Time Calculation (min): 31 min Charges:  OT General Charges $OT Visit: 1 Visit OT Evaluation $OT Eval Moderate Complexity: 1 Mod OT Treatments $Self Care/Home Management : 8-22 mins $Therapeutic Activity: 8-22 mins  Gerrianne Scale, MS, OTR/L ascom [phone] 09/04/20, 10:56 AM

## 2020-09-04 NOTE — Evaluation (Signed)
Physical Therapy Evaluation Patient Details Name: Cynthia Walker MRN: [id number] DOB: [birth year] Today's Date: 09/04/2020   History of Present Illness  Pt is an 85 y/o F With PMH: R BRCA s/p lumpectomy, XRT and Chemo 2009 as well as HTN and moderate dementia. Had IM nailing after fall to L hip 06/2019. Pt presented to ED d/t abdominal pain, weakness, and constipation x2 weeks. Low probablility of infection as pt just finished Rx for UTI. Concerns for cancer mets to R fifth rib per MD note.    Clinical Impression  Pt alert throughout session, oriented to name only, adamant she is at her pastors house. At rest pt with flat affect, but with any mobility attempts exhibited significant pain signs/symptoms, anxiety, and began to cry. No family at bedside to confirm PLOF; per OT who spoke with family pt for the last two weeks has experienced a decline in mobility, no longer able to stand to allow for [facility] transfers or ADL assist.   The patient was able to follow one step commands ~80% of the time, improved ability with tactile/visual cues. Several supine LE exercises attempted, AAROM/PROM x10 ankle pumps, heel slides, hip abduction/adduction, SLR with constant facilitation/cueing/redirection to task. Also able to lift BUE against gravity to touch PT hand x5 bilaterally. Supine <> sit with max/totalA (per PLOF husband uses hoyer lift to transfer to [facility]). Pt unable/unwilling to maintain sitting, exhibited crying, grimacing, and reported that her buttocks was painful. Max-totalA to maintain seated position despite BUE and BLE support. Returned to supine totalA and repostiioned with second person assist.  Overall the patient demonstrated deficits (see "PT Problem List") that impede the patient's functional abilities, safety, and mobility.  Pt placed on trial PT services pending pts further ability/appropriateness to participate with therapy. Recommendation is SNF due to acute decline in functional mobility and  current level of assistance needed.    Follow Up Recommendations SNF (Pt placed on trial PT pending ability/appropriateness to participate with therapy services.)    Equipment Recommendations  Other (comment) (TBD)    Recommendations for Other Services OT consult     Precautions / Restrictions Precautions Precautions: Fall Restrictions Weight Bearing Restrictions: No      Mobility  Bed Mobility Overal bed mobility: Needs Assistance Bed Mobility: Supine to Sit;Sit to Supine     Supine to sit: Max assist;HOB elevated Sit to supine: Total assist   General bed mobility comments: MAX A with cues to use bed rail and HOB elevated for sup to sit. Despite cues, pt does not really contribute to sit to sup.    Transfers                 General transfer comment: unable to test at this time  Ambulation/Gait                Stairs            Wheelchair Mobility    Modified Rankin (Stroke Patients Only)       Balance Overall balance assessment: Needs assistance Sitting-balance support: Bilateral upper extremity supported;Feet supported Sitting balance-Leahy Scale: Zero Sitting balance - Comments: maxA to maintain sitting despite BUE propping, pt with heavy posterior lean Postural control: Posterior lean     Standing balance comment: unable                             Pertinent Vitals/Pain Pain Assessment: Faces Faces Pain Scale: Hurts whole lot Pain  Location: Pt reported that her bottom hurt Pain Descriptors / Indicators: Grimacing;Guarding;Crying Pain Intervention(s): Limited activity within patient's tolerance;Monitored during session;Repositioned    Home Living Family/patient expects to be discharged to:: Private residence Living Arrangements: Spouse/significant other Available Help at Discharge: Family;Available 24 hours/day Type of Home: House Home Access: Ramped entrance (3 ramps, one in front that goes up and then living room  is sunken in, so second ramp down into living room. In addition, ramp from garage where they park w/c accessible Lucianne Lei.)     Home Layout: One level Home Equipment: Cane - single point;Bedside commode;Grab bars - toilet;Grab bars - tub/shower;Wheelchair - manual Additional Comments: PLOF gathered from OT evaluation from pt husband    Prior Function Level of Independence: Needs assistance   Gait / Transfers Assistance Needed: Husband reports that pt has progressively declined with her ability to mobilize including getting into/out of bed (now using hoyer), and declined with ability to ambulate, primarily uses w/c at baseline and has been seeing outpatient PT in [place].  ADL's / Homemaking Assistance Needed: Requires spouse's assist for bathing, dressing, and toileting. He reports that most recently, he stands her with grab bar, removes w/c and slides BSC under her and this is where she sits for sink-bath. However, spouse endorses that pt has not been able to stand to participate in this x2 weeks now.  Comments: Endorses one major fall last year requiring hip sx here.     Hand Dominance   Dominant Hand: Right    Extremity/Trunk Assessment   Upper Extremity Assessment Upper Extremity Assessment: Generalized weakness;RUE deficits/detail;LUE deficits/detail RUE Deficits / Details: able to lift against gravity, tap her fingers to PT hand LUE Deficits / Details: able to lift against gravity, tap her fingers to PT hand at midline and across midline    Lower Extremity Assessment Lower Extremity Assessment: RLE deficits/detail;LLE deficits/detail RLE Deficits / Details: pt able to wiggle toes and with AAROM/factilatiion perform limited ankle DF/PF, heel slides, SLR, hip abduction/adduction LLE Deficits / Details: pt able to wiggle toes and with AAROM/factilatiion perform limited ankle DF/PF, heel slides, SLR, hip abduction/adduction    Cervical / Trunk Assessment Cervical / Trunk Assessment:  Kyphotic  Communication   Communication: Other (comment) (pt with very limited verbal communications t/o session.)  Cognition Arousal/Alertness: Awake/alert Behavior During Therapy: Anxious;Flat affect (flat while resting, anxious with mobility)                                   General Comments: pt oriented to self only. adamant that she is at her pastors house      General Comments      Exercises Other Exercises Other Exercises: Pt easily redirected during session, but did exhibit anxiety as well as crying with all mobility attempts. Other Exercises: Pt performed supine exercises AAROM/PROM x10 ankle pumps, heel slides, hip abduction/adduction, SLR. Also able to lift BUE against gravity to touch PT hand x5 bilaterally.   Assessment/Plan    PT Assessment Patient needs continued PT services (Pt placed on trial PT pending ability/appropriateness to participate with therapy services.)  PT Problem List Decreased strength;Decreased mobility;Decreased safety awareness;Decreased activity tolerance;Decreased balance;Decreased knowledge of use of DME;Decreased cognition       PT Treatment Interventions DME instruction;Therapeutic exercise;Gait training;Balance training;Wheelchair mobility training;Neuromuscular re-education;Functional mobility training;Therapeutic activities;Patient/family education (Pt placed on trial PT pending ability/appropriateness to participate with therapy services.)    PT Goals (Current goals  can be found in the Care Plan section)  Acute Rehab PT Goals Patient Stated Goal: to get her stronger so she's better able to help me move her PT Goal Formulation: Patient unable to participate in goal setting Time For Goal Achievement: 09/18/20 Potential to Achieve Goals: Fair    Frequency Min 2X/week   Barriers to discharge        Co-evaluation               AM-PAC PT "6 Clicks" Mobility  Outcome Measure Help needed turning from your back to  your side while in a flat bed without using bedrails?: A Lot Help needed moving from lying on your back to sitting on the side of a flat bed without using bedrails?: A Lot Help needed moving to and from a bed to a chair (including a wheelchair)?: Total Help needed standing up from a chair using your arms (e.g., wheelchair or bedside chair)?: Total Help needed to walk in hospital room?: Total Help needed climbing 3-5 steps with a railing? : Total 6 Click Score: 8    End of Session   Activity Tolerance: Other (comment);Patient limited by pain (limited by pt anxiety/pain) Patient left: with bed alarm set;with call bell/phone within reach;in bed Nurse Communication: Mobility status PT Visit Diagnosis: Other abnormalities of gait and mobility (R26.89);Muscle weakness (generalized) (M62.81)    Time: 1340-1405 PT Time Calculation (min) (ACUTE ONLY): 25 min   Charges:   PT Evaluation $PT Eval Moderate Complexity: 1 Mod PT Treatments $Therapeutic Activity: 8-22 mins        Lieutenant Diego PT, DPT 2:50 PM,09/04/20

## 2020-09-04 NOTE — Plan of Care (Signed)
?  Problem: Elimination: ?Goal: Will not experience complications related to bowel motility ?Outcome: Progressing ?  ?Problem: Pain Managment: ?Goal: General experience of comfort will improve ?Outcome: Progressing ?  ?Problem: Safety: ?Goal: Ability to remain free from injury will improve ?Outcome: Progressing ?  ?

## 2020-09-04 NOTE — Progress Notes (Signed)
PROGRESS NOTE   Cynthia Walker  KWI:[id number] DOB: 1935-01-21 DOA: 09/01/2020 PCP: Sofie Hartigan, MD  Brief Narrative:  85 year old home dwelling WF Right breast cancer with lumpectomy 2009 status post XRT, chemo HTN, moderate dementia came to ED with generalized weakness abdominal pain and severe constipation X 2 weeks Recent Rx for UTI WBC 7 hemoglobin 9 potassium 3.4-CT abdomen pelvis? Right fifth rib erosion? Spread of malignancy  Assessment & Plan:   Principal Problem:   Abdominal pain Active Problems:   Essential hypertension   Normocytic anemia   Dementia (HCC)   Moderate protein malnutrition (HCC)   Hypokalemia   Constipation   Metabolic encephalopathy   1. Recent Rx urinary infection ciprofloxacin 08/14/2020 a. Low suspicion of infection as recent treatment with Cipro completed 6 days per husband b. No further work-up as UA is negative 2. Severe constipation abdominal pain a. No effect with magnesium citrate in addition to Dulcolax p.o. MiraLAX and senna b. Start fleets enema--1 stool documented on output 3. Mild hypokalemia on admission a. Resolved b. DC fluids and monitor trends 4. Breast cancer status post lumpectomy/XRT with concerns on admission this admission for malignancy spread a. Probably has spread to rib b. unlikely good candidate for systemic therapies 5. HTN a. Not on meds and would probably withhold treatment given severe dementia 6. Moderate to severe dementia worsening with excessive somnolence over several months per office note 08/01/2020 a. Therapy has evaluated the patient but not sure how much she would participate with skilled b. We will ask palliative care to help with palliative discussions and case manager to help plan disposition  DVT prophylaxis: Lovenox Code Status: DNR confirmed at bed  Family Communication: Discussed with husband at bedside in detail on 11/29--I mentioned to him the challenges of given therapy and we have  gone over possible hospice at home and have agreed to follow-up in a.m. but likely disposition might be best at home with hospice following Disposition:  Status is: Observation  The patient will require care spanning > 2 midnights and should be moved to inpatient because: Persistent severe electrolyte disturbances, Altered mental status and Unsafe d/c plan  Dispo: The patient is from: Home              Anticipated d/c is to: Unclear at this time              Anticipated d/c date is: 1 day              Patient currently is not medically stable to d/c.  (Not a safe discharge)   Consultants:   None yet  Procedures: No  Antimicrobials: None   Subjective:  Monosyllabic PT notes OT notes Reviewed-tearful with attempts at mobilization  Objective: Vitals:   09/04/20 0100 09/04/20 0535 09/04/20 0721 09/04/20 1107  BP:  134/89 127/75 131/73  Pulse:   89 87  Resp:  20 16 17   Temp:  98.9 F (37.2 C) 98.6 F (37 C) 98.2 F (36.8 C)  TempSrc:  Oral Oral Oral  SpO2:  96% 97% 97%  Weight: 55.8 kg     Height:        Intake/Output Summary (Last 24 hours) at 09/04/2020 1411 Last data filed at 09/04/2020 0050 Gross per 24 hour  Intake --  Output 601 ml  Net -601 ml   Filed Weights   09/02/20 0316 09/03/20 0650 09/04/20 0100  Weight: 55.5 kg 54.6 kg 55.8 kg    Examination:  Awake alert x1-pleasant-confused  otherwise EOMI NCAT frail and keeps her eyes closed during most of the interaction CTA B no added sound no rales no rhonchi Abdomen soft nontender   Data Reviewed: I have personally reviewed following labs and imaging studies  No labs today   Radiology Studies: No results found.   Scheduled Meds: . bisacodyl  5 mg Oral Daily  . enoxaparin (LOVENOX) injection  40 mg Subcutaneous Q24H  . feeding supplement  1 Container Oral TID BM  . magnesium oxide  200 mg Oral Daily  . senna-docusate  1 tablet Oral BID   Continuous Infusions:   LOS: 2 days    Time  spent: [place], MD [facility] To contact the attending provider between 7A-7P or the covering provider during after hours 7P-7A, please log into the web site [email or web address] and access using universal [place] password for that web site. If you do not have the password, please call the hospital operator.  09/04/2020, 2:11 PM

## 2020-09-05 ENCOUNTER — Encounter: Payer: Self-pay | Admitting: Family Medicine

## 2020-09-05 DIAGNOSIS — F0390 Unspecified dementia without behavioral disturbance: Secondary | ICD-10-CM

## 2020-09-05 DIAGNOSIS — R1084 Generalized abdominal pain: Secondary | ICD-10-CM | POA: Diagnosis not present

## 2020-09-05 DIAGNOSIS — Z7189 Other specified counseling: Secondary | ICD-10-CM

## 2020-09-05 DIAGNOSIS — Z515 Encounter for palliative care: Secondary | ICD-10-CM

## 2020-09-05 LAB — RENAL FUNCTION PANEL
Albumin: 3.1 g/dL — ABNORMAL LOW (ref 3.5–5.0)
Anion gap: 11 (ref 5–15)
BUN: 11 mg/dL (ref 8–23)
CO2: 26 mmol/L (ref 22–32)
Calcium: 8.9 mg/dL (ref 8.9–10.3)
Chloride: 98 mmol/L (ref 98–111)
Creatinine, Ser: 0.62 mg/dL (ref 0.44–1.00)
GFR, Estimated: >60 mL/min (ref >60)
Glucose, Bld: 126 mg/dL — ABNORMAL HIGH (ref 70–99)
Phosphorus: 3.5 mg/dL (ref 2.5–4.6)
Potassium: 4 mmol/L (ref 3.5–5.1)
Sodium: 135 mmol/L (ref 135–145)

## 2020-09-05 LAB — CBC WITH DIFFERENTIAL/PLATELET
Abs Immature Granulocytes: 0.03 10*3/uL (ref 0.00–0.07)
Basophils Absolute: 0 10*3/uL (ref 0.0–0.1)
Basophils Relative: 0 %
Eosinophils Absolute: 0.1 10*3/uL (ref 0.0–0.5)
Eosinophils Relative: 1 %
HCT: 34.8 % — ABNORMAL LOW (ref 36.0–46.0)
Hemoglobin: 11.5 g/dL — ABNORMAL LOW (ref 12.0–15.0)
Immature Granulocytes: 0 %
Lymphocytes Relative: 12 %
Lymphs Abs: 1 10*3/uL (ref 0.7–4.0)
MCH: 29.7 pg (ref 26.0–34.0)
MCHC: 33 g/dL (ref 30.0–36.0)
MCV: 89.9 fL (ref 80.0–100.0)
Monocytes Absolute: 0.7 10*3/uL (ref 0.1–1.0)
Monocytes Relative: 8 %
Neutro Abs: 6.4 10*3/uL (ref 1.7–7.7)
Neutrophils Relative %: 79 %
Platelets: 445 10*3/uL — ABNORMAL HIGH (ref 150–400)
RBC: 3.87 MIL/uL (ref 3.87–5.11)
RDW: 13.5 % (ref 11.5–15.5)
WBC: 8.2 10*3/uL (ref 4.0–10.5)
nRBC: 0 % (ref 0.0–0.2)

## 2020-09-05 LAB — SARS CORONAVIRUS 2 BY RT PCR (HOSPITAL ORDER, PERFORMED IN ~~LOC~~ HOSPITAL LAB): SARS Coronavirus 2: NEGATIVE

## 2020-09-05 MED ORDER — ACETAMINOPHEN 325 MG PO TABS: 650.0000 mg | ORAL_TABLET | Freq: Four times a day (QID) | ORAL | Status: AC | PRN

## 2020-09-05 MED ORDER — SENNOSIDES-DOCUSATE SODIUM 8.6-50 MG PO TABS: 1.0000 | ORAL_TABLET | Freq: Two times a day (BID) | ORAL | Status: AC

## 2020-09-05 MED ORDER — LORAZEPAM 2 MG/ML IJ SOLN: 0.5000 mg | Freq: Two times a day (BID) | INTRAMUSCULAR | Status: DC | PRN

## 2020-09-05 NOTE — Care Management Important Message (Signed)
Important Message  Patient Details  Name: Cynthia Walker MRN: [id number] Date of Birth: Apr 06, 1935   Medicare Important Message Given:  Yes     Dannette Barbara 09/05/2020, 11:53 AM

## 2020-09-05 NOTE — Progress Notes (Signed)
Patient reasonably stabilized for discharge when skilled bed available Covid test ordered and pending See my discharge summary from 11/30-I have discussed this plan with her husband

## 2020-09-05 NOTE — Plan of Care (Signed)
Pt A&Ox0. Not taking PO meds even in applesauce. Took a few sips of Boost supplement. Husband at the bedside. Will continue to monitor.   Problem: Education: Goal: Knowledge of General Education information will improve Description: Including pain rating scale, medication(s)/side effects and non-pharmacologic comfort measures Outcome: Not Progressing   Problem: Health Behavior/Discharge Planning: Goal: Ability to manage health-related needs will improve Outcome: Not Progressing   Problem: Clinical Measurements: Goal: Ability to maintain clinical measurements within normal limits will improve Outcome: Progressing Goal: Will remain free from infection Outcome: Progressing Goal: Diagnostic test results will improve Outcome: Progressing Goal: Respiratory complications will improve Outcome: Progressing Goal: Cardiovascular complication will be avoided Outcome: Progressing   Problem: Activity: Goal: Risk for activity intolerance will decrease Outcome: Not Progressing   Problem: Nutrition: Goal: Adequate nutrition will be maintained Outcome: Not Progressing   Problem: Coping: Goal: Level of anxiety will decrease Outcome: Progressing   Problem: Elimination: Goal: Will not experience complications related to bowel motility Outcome: Progressing Goal: Will not experience complications related to urinary retention Outcome: Progressing   Problem: Pain Managment: Goal: General experience of comfort will improve Outcome: Progressing   Problem: Safety: Goal: Ability to remain free from injury will improve Outcome: Progressing   Problem: Skin Integrity: Goal: Risk for impaired skin integrity will decrease Outcome: Progressing

## 2020-09-05 NOTE — Progress Notes (Signed)
Pt is moving to room 105. Pt spouse made aware. Report given to [facility]. Requested for [facility] to transfer the patient.

## 2020-09-05 NOTE — NC FL2 (Signed)
[place] LEVEL OF CARE SCREENING TOOL     IDENTIFICATION  Patient Name: Cynthia Walker Birthdate: May 31, 1935 Sex: female Admission Date (Current Location): 09/01/2020  [place] and [place] Number:  Engineering geologist and Address:  [facility], [address], [place], [place] [address]      Provider Number: [id number]  Attending Physician Name and Address:  Nita Sells, MD  Relative Name and Phone Number:  Odis Wickey- Husband [phone]    Current Level of Care: Hospital Recommended Level of Care: [place] Prior Approval Number:    Date Approved/Denied:   PASRR Number: [id number]  Discharge Plan: SNF    Current Diagnoses: Patient Active Problem List   Diagnosis Date Noted  . Metabolic encephalopathy 09/32/6712  . Abdominal pain 09/01/2020  . Dementia ([place])   . Moderate protein malnutrition ([place])   . Hypokalemia   . Constipation   . Normocytic anemia 04/26/2020  . Encounter for hospice care discussion   . MRSA bacteremia 02/17/2020  . Streptococcal bacteremia 02/17/2020  . Bacteremia 02/17/2020  . History of breast cancer 02/17/2020  . Goals of care, counseling/discussion   . Palliative care by specialist   . DNR (do not resuscitate) discussion   . Breast pain, right 01/11/2020  . Femoral neck fracture ([place]) 06/27/2019  . Femur fracture, left ([place]) 06/26/2019  . Right lower lobe pulmonary nodule 05/04/2019  . Syncope 09/27/2017  . Atypical chest pain 09/03/2017  . Essential hypertension 09/03/2017  . Short-term memory loss 09/03/2017  . Osteoporosis 04/02/2016  . Breast cancer, right ([place]) 04/06/2008    Orientation RESPIRATION BLADDER Height & Weight     Self  Normal Incontinent Weight: 52.5 kg Height:  5\' 4"  (162.6 cm)  BEHAVIORAL SYMPTOMS/MOOD NEUROLOGICAL BOWEL NUTRITION STATUS      Incontinent Diet (Assistance Needed)  AMBULATORY STATUS COMMUNICATION OF NEEDS Skin    Extensive Assist Verbally Normal                       Personal Care Assistance Level of Assistance  Bathing, Feeding, Dressing, Total care Bathing Assistance: Maximum assistance Feeding assistance: Maximum assistance Dressing Assistance: Maximum assistance Total Care Assistance: Maximum assistance   Functional Limitations Info  Sight, Hearing, Speech Sight Info: Adequate Hearing Info: Adequate Speech Info: Adequate    SPECIAL CARE FACTORS FREQUENCY  PT (By licensed PT), OT (By licensed OT)     PT Frequency: 5X Week OT Frequency: 5X Week            Contractures Contractures Info: Not present    Additional Factors Info  Allergies Code Status Info: DNR Allergies Info: flu virus vaccine, latex, eggs, procaine soy isoflavones, penicillins           Current Medications (09/05/2020):  This is the current hospital active medication list Current Facility-Administered Medications  Medication Dose Route Frequency Provider Last Rate Last Admin  . acetaminophen (TYLENOL) tablet 650 mg  650 mg Oral Q6H PRN Reubin Milan, MD   650 mg at 09/02/20 2127   Or  . acetaminophen (TYLENOL) suppository 650 mg  650 mg Rectal Q6H PRN Reubin Milan, MD      . bisacodyl (DULCOLAX) EC tablet 5 mg  5 mg Oral Daily Nita Sells, MD   5 mg at 09/04/20 4580  . enoxaparin (LOVENOX) injection 40 mg  40 mg Subcutaneous Q24H Reubin Milan, MD   40 mg at 09/04/20 2240  . feeding supplement (BOOST /  RESOURCE BREEZE) liquid 1 Container  1 Container Oral TID BM Nita Sells, MD   1 Container at 09/05/20 [phone]  . magnesium oxide (MAG-OX) tablet 200 mg  200 mg Oral Daily Reubin Milan, MD   200 mg at 09/04/20 3887  . ondansetron (ZOFRAN) tablet 4 mg  4 mg Oral Q6H PRN Reubin Milan, MD       Or  . ondansetron [facility]) injection 4 mg  4 mg Intravenous Q6H PRN Reubin Milan, MD      . senna-docusate (Senokot-S) tablet 1 tablet  1 tablet Oral BID Reubin Milan, MD   1 tablet at 09/04/20 1959     Discharge Medications: Please see discharge summary for a list of discharge medications.  Relevant Imaging Results:  Relevant Lab Results:   Additional Information    Kerin Salen, RN

## 2020-09-05 NOTE — NC FL2 (Signed)
[place] LEVEL OF CARE SCREENING TOOL     IDENTIFICATION  Patient Name: Cynthia Walker Birthdate: 04/04/1935 Sex: female Admission Date (Current Location): 09/01/2020  [place] and [place] Number:  Engineering geologist and Address:  [facility], [address], [place], [place] [address]      Provider Number: [id number]  Attending Physician Name and Address:  Nita Sells, MD  Relative Name and Phone Number:  Kambree Krauss- Husband [phone]    Current Level of Care: Hospital Recommended Level of Care: [place] Prior Approval Number:    Date Approved/Denied:   PASRR Number: [id number]  Discharge Plan: SNF    Current Diagnoses: Patient Active Problem List   Diagnosis Date Noted  . Metabolic encephalopathy 10/93/2355  . Abdominal pain 09/01/2020  . Dementia ([place])   . Moderate protein malnutrition ([place])   . Hypokalemia   . Constipation   . Normocytic anemia 04/26/2020  . Encounter for hospice care discussion   . MRSA bacteremia 02/17/2020  . Streptococcal bacteremia 02/17/2020  . Bacteremia 02/17/2020  . History of breast cancer 02/17/2020  . Goals of care, counseling/discussion   . Palliative care by specialist   . DNR (do not resuscitate) discussion   . Breast pain, right 01/11/2020  . Femoral neck fracture ([place]) 06/27/2019  . Femur fracture, left ([place]) 06/26/2019  . Right lower lobe pulmonary nodule 05/04/2019  . Syncope 09/27/2017  . Atypical chest pain 09/03/2017  . Essential hypertension 09/03/2017  . Short-term memory loss 09/03/2017  . Osteoporosis 04/02/2016  . Breast cancer, right ([place]) 04/06/2008    Orientation RESPIRATION BLADDER Height & Weight     Self  Normal Incontinent Weight: 52.5 kg Height:  5\' 4"  (732.2 cm)  BEHAVIORAL SYMPTOMS/MOOD NEUROLOGICAL BOWEL NUTRITION STATUS      Incontinent Diet (Assistance Needed)  AMBULATORY STATUS COMMUNICATION OF NEEDS Skin    Extensive Assist Verbally Normal                       Personal Care Assistance Level of Assistance  Bathing, Feeding, Dressing, Total care Bathing Assistance: Maximum assistance Feeding assistance: Maximum assistance Dressing Assistance: Maximum assistance Total Care Assistance: Maximum assistance   Functional Limitations Info  Sight, Hearing, Speech Sight Info: Adequate Hearing Info: Adequate Speech Info: Adequate    SPECIAL CARE FACTORS FREQUENCY  PT (By licensed PT), OT (By licensed OT)     PT Frequency: 5X Week OT Frequency: 5X Week            Contractures Contractures Info: Not present    Additional Factors Info  Allergies Code Status Info: DNR Allergies Info: flu virus vaccine, latex, eggs, procaine soy isoflavones, penicillins           Current Medications (09/05/2020):  This is the current hospital active medication list Current Facility-Administered Medications  Medication Dose Route Frequency Provider Last Rate Last Admin  . acetaminophen (TYLENOL) tablet 650 mg  650 mg Oral Q6H PRN Reubin Milan, MD   650 mg at 09/02/20 2127   Or  . acetaminophen (TYLENOL) suppository 650 mg  650 mg Rectal Q6H PRN Reubin Milan, MD      . bisacodyl (DULCOLAX) EC tablet 5 mg  5 mg Oral Daily Nita Sells, MD   5 mg at 09/04/20 0254  . enoxaparin (LOVENOX) injection 40 mg  40 mg Subcutaneous Q24H Reubin Milan, MD   40 mg at 09/04/20 2240  . feeding supplement (BOOST /  RESOURCE BREEZE) liquid 1 Container  1 Container Oral TID BM Nita Sells, MD   1 Container at 09/05/20 [phone]  . magnesium oxide (MAG-OX) tablet 200 mg  200 mg Oral Daily Reubin Milan, MD   200 mg at 09/04/20 3200  . ondansetron (ZOFRAN) tablet 4 mg  4 mg Oral Q6H PRN Reubin Milan, MD       Or  . ondansetron [facility] [place]) injection 4 mg  4 mg Intravenous Q6H PRN Reubin Milan, MD      . senna-docusate (Senokot-S) tablet 1 tablet  1 tablet Oral BID Reubin Milan, MD   1 tablet at 09/04/20 3794     Discharge Medications: Please see discharge summary for a list of discharge medications.  Relevant Imaging Results:  Relevant Lab Results:   Additional Information    Victorino Dike, RN

## 2020-09-05 NOTE — Discharge Summary (Signed)
Physician Discharge Summary  Cynthia Walker:[id number] DOB: 06-14-1935 DOA: 09/01/2020  PCP: Sofie Hartigan, MD  Admit date: 09/01/2020 Discharge date: 09/05/2020  Time spent: 20 minutes  Recommendations for Outpatient Follow-up:  1. Recommend attempt at physical therapy occupational therapy at skilled facility 2. Recommend palliative care to follow 3. Get labs in about 1 week at facility  Discharge Diagnoses:  Principal Problem:   Abdominal pain Active Problems:   Essential hypertension   Normocytic anemia   Dementia (HCC)   Moderate protein malnutrition (HCC)   Hypokalemia   Constipation   Metabolic encephalopathy   Discharge Condition: Guarded  Diet recommendation: Soft  Filed Weights   09/03/20 0650 09/04/20 0100 09/05/20 0431  Weight: 54.6 kg 55.8 kg 52.5 kg    History of present illness:  85 year old home dwelling WF Right breast cancer with lumpectomy 2009 status post XRT, chemo HTN, moderate dementia came to ED with generalized weakness abdominal pain and severe constipation X 2 weeks Recent Rx for UTI WBC 7 hemoglobin 9 potassium 3.4-CT abdomen pelvis? Right fifth rib erosion? Spread of malignancy   Hospital Course:  1. Recent Rx urinary infection ciprofloxacin 08/14/2020 a. Low suspicion of infection as recent treatment with Cipro completed 6 days per husband b. No further work-up as UA is negative 2. Severe constipation abdominal pain a. magnesium citrate in addition to Dulcolax p.o. MiraLAX and senna b. Start fleets enema--1 stool documented on output  c. continue d. Oral regimen with MiraLAX and senna on discharge 3. Mild hypokalemia on admission a. Resolved b. DC fluids and monitor trends 4. Breast cancer status post lumpectomy/XRT with concerns on admission this admission for malignancy spread a. Probably has spread to rib b. unlikely good candidate for systemic therapies 5. HTN a. Not on meds and would probably withhold treatment  given severe dementia 6. Moderate to severe dementia worsening with excessive somnolence over several months per office note 08/01/2020 a. Therapy has evaluated the patient but not sure how much she would participate with skilled b. We will ask palliative care to help with disposition-may need palliative care to follow at facility   Discharge Exam: Vitals:   09/05/20 1107 09/05/20 1216  BP: 132/71 109/60  Pulse: 93 81  Resp: 16 18  Temp: 97.6 F (36.4 C) 98.3 F (36.8 C)  SpO2: 98% 96%   Cannot orient laying in bed without distress moans and groans at times but cannot get meaningful discussion   General: Awake but pleasantly disoriented a little bit more verbal than prior no distress EOMI Cardiovascular: S1-S2 no murmur NSR on exam Respiratory: Clear no rales rhonchi  Discharge Instructions   Discharge Instructions    Diet - low sodium heart healthy   Complete by: As directed    Increase activity slowly   Complete by: As directed      Allergies as of 09/05/2020      Reactions   Flu Virus Vaccine    Other reaction(s): Other (See Comments) Stomach cramps   Latex Swelling   Other    Other reaction(s): Unknown Allergy to eggs   Procaine Other (See Comments)   weakness   Soy Isoflavones    Other reaction(s): Other (See Comments) Soy causes legs to feel like rubber   Penicillins Rash   Has patient had a PCN reaction causing immediate rash, facial/tongue/throat swelling, SOB or lightheadedness with hypotension: No Has patient had a PCN reaction causing severe rash involving mucus membranes or skin necrosis: No Has patient had a  PCN reaction that required hospitalization: No Has patient had a PCN reaction occurring within the last 10 years: No If all of the above answers are "NO", then may proceed with Cephalosporin use.      Medication List    TAKE these medications   acetaminophen 325 MG tablet Commonly known as: TYLENOL Take 2 tablets (650 mg total) by mouth  every 6 (six) hours as needed for mild pain (or Fever >/= 101).   Adult Gummy Chew Chew 1 tablet by mouth daily.   donepezil 10 MG disintegrating tablet Commonly known as: ARICEPT ODT Take 10 mg by mouth at bedtime.   polyethylene glycol 17 g packet Commonly known as: MIRALAX / GLYCOLAX Take 17 g by mouth daily as needed for mild constipation or moderate constipation.   senna-docusate 8.6-50 MG tablet Commonly known as: Senokot-S Take 1 tablet by mouth 2 (two) times daily.      Allergies  Allergen Reactions  . Flu Virus Vaccine     Other reaction(s): Other (See Comments) Stomach cramps  . Latex Swelling  . Other     Other reaction(s): Unknown Allergy to eggs  . Procaine Other (See Comments)    weakness  . Soy Isoflavones     Other reaction(s): Other (See Comments) Soy causes legs to feel like rubber  . Penicillins Rash    Has patient had a PCN reaction causing immediate rash, facial/tongue/throat swelling, SOB or lightheadedness with hypotension: No Has patient had a PCN reaction causing severe rash involving mucus membranes or skin necrosis: No Has patient had a PCN reaction that required hospitalization: No Has patient had a PCN reaction occurring within the last 10 years: No If all of the above answers are "NO", then may proceed with Cephalosporin use.       The results of significant diagnostics from this hospitalization (including imaging, microbiology, ancillary and laboratory) are listed below for reference.    Significant Diagnostic Studies: CT ABDOMEN PELVIS W CONTRAST  Result Date: 09/01/2020 CLINICAL DATA:  Acute abdominal pain, constipation EXAM: CT ABDOMEN AND PELVIS WITH CONTRAST TECHNIQUE: Multidetector CT imaging of the abdomen and pelvis was performed using the standard protocol following bolus administration of intravenous contrast. CONTRAST:  34mL OMNIPAQUE IOHEXOL 300 MG/ML  SOLN COMPARISON:  04/04/2015, 01/14/2020 FINDINGS: Technical note:  Examination is mildly degraded by motion artifact as well as beam hardening artifact related to the positioning of the right arm positioned over the abdomen. Lower chest: Wedge-shaped opacity within the periphery of the right middle lobe, measuring approximately 2.5 x 1.4 cm (series 4, image 3), which has increased in size from 01/14/2020. The adjacent lateral right fifth rib appears to be eroded (series 2, images 3-4). Left basilar atelectasis. Heart size within normal limits. Likely postlumpectomy changes within the inferior aspect of the right breast. Hepatobiliary: No interval change in appearance of 2 low-density lesions within the liver. Liver appears otherwise within normal limits. Motion degraded appearance of the gallbladder. No evidence for gallstone or inflammation. Pancreas: Grossly unremarkable. Spleen: Normal in size without focal abnormality. Adrenals/Urinary Tract: Unremarkable adrenal glands. Simple 4 cm interpolar cyst within the right kidney. The kidneys have otherwise symmetric enhancement. No renal stone or hydronephrosis. Urinary bladder is within normal limits. Stomach/Bowel: Stomach is within normal limits. Diverticulosis within the sigmoid colon. No evidence of bowel wall thickening, distention, or inflammatory changes. Moderate volume of stool within the colon. Vascular/Lymphatic: Aortoiliac atherosclerotic calcifications without aneurysm. Mildly dilated gonadal and pelvic veins, left greater than right. No abdominopelvic  lymphadenopathy. Reproductive: Uterus and bilateral adnexa are unremarkable. Other: No free fluid. No abdominopelvic fluid collection. No pneumoperitoneum. No abdominal wall hernia. Musculoskeletal: Prior left hip ORIF.  No acute osseous findings. IMPRESSION: 1. No acute abdominopelvic findings. 2. Wedge-shaped opacity within the periphery of the right middle lobe, measuring approximately 2.5 x 1.4 cm, increased in size from 01/14/2020. The adjacent lateral right fifth rib  appears to be eroded. Findings are concerning for malignancy. Further evaluation with dedicated contrast enhanced chest CT is recommended. 3. Moderate volume of stool within the colon. 4. Sigmoid diverticulosis without evidence of acute diverticulitis. 5. Mildly dilated gonadal and pelvic veins, left greater than right, which can be seen in the setting of pelvic congestion syndrome. 6. Likely postlumpectomy changes within the inferior aspect of the right breast. Correlate with recent mammogram. 7. Aortic atherosclerosis. Electronically Signed   By: Davina Poke D.O.   On: 09/01/2020 11:21    Microbiology: Recent Results (from the past 240 hour(s))  SARS CORONAVIRUS 2 (TAT 6-24 HRS) Nasopharyngeal Nasopharyngeal Swab     Status: None   Collection Time: 09/02/20  8:12 PM   Specimen: Nasopharyngeal Swab  Result Value Ref Range Status   SARS Coronavirus 2 NEGATIVE NEGATIVE Final    Comment: (NOTE) SARS-CoV-2 target nucleic acids are NOT DETECTED.  The SARS-CoV-2 RNA is generally detectable in upper and lower respiratory specimens during the acute phase of infection. Negative results do not preclude SARS-CoV-2 infection, do not rule out co-infections with other pathogens, and should not be used as the sole basis for treatment or other patient management decisions. Negative results must be combined with clinical observations, patient history, and epidemiological information. The expected result is Negative.  Fact Sheet for Patients: SugarRoll.be  Fact Sheet for Healthcare Providers: https://www.woods-mathews.com/  This test is not yet approved or cleared by the [place] FDA and  has been authorized for detection and/or diagnosis of SARS-CoV-2 by FDA under an Emergency Use Authorization (EUA). This EUA will remain  in effect (meaning this test can be used) for the duration of the COVID-19 declaration under Se ction 564(b)(1) of the Act, 21  U.S.C. section 360bbb-3(b)(1), unless the authorization is terminated or revoked sooner.  Performed at [facility], [place] [address], [place], [place] [address]   SARS Coronavirus 2 by RT PCR (hospital order, performed in [facility] hospital lab) Nasopharyngeal Nasopharyngeal Swab     Status: None   Collection Time: 09/03/20 10:00 AM   Specimen: Nasopharyngeal Swab  Result Value Ref Range Status   SARS Coronavirus 2 NEGATIVE NEGATIVE Final    Comment: (NOTE) SARS-CoV-2 target nucleic acids are NOT DETECTED.  The SARS-CoV-2 RNA is generally detectable in upper and lower respiratory specimens during the acute phase of infection. The lowest concentration of SARS-CoV-2 viral copies this assay can detect is 250 copies / mL. A negative result does not preclude SARS-CoV-2 infection and should not be used as the sole basis for treatment or other patient management decisions.  A negative result may occur with improper specimen collection / handling, submission of specimen other than nasopharyngeal swab, presence of viral mutation(s) within the areas targeted by this assay, and inadequate number of viral copies (<250 copies / mL). A negative result must be combined with clinical observations, patient history, and epidemiological information.  Fact Sheet for Patients:   StrictlyIdeas.no  Fact Sheet for Healthcare Providers: BankingDealers.co.za  This test is not yet approved or  cleared by the [place] FDA and has been  authorized for detection and/or diagnosis of SARS-CoV-2 by FDA under an Emergency Use Authorization (EUA).  This EUA will remain in effect (meaning this test can be used) for the duration of the COVID-19 declaration under Section 564(b)(1) of the Act, 21 U.S.C. section 360bbb-3(b)(1), unless the authorization is terminated or revoked sooner.  Performed at [facility], [address] [place], [place]  [address]      Labs: Basic Metabolic Panel: Recent Labs  Lab 09/01/20 [id number] 09/02/20 0557 09/03/20 0523 09/05/20 0514  NA 137 135 139 135  K 3.4* 4.0 4.2 4.0  CL 106 101 103 98  CO2 22 25 25 26   GLUCOSE 94 121* 102* 126*  BUN 8 <5* 5* 11  CREATININE 0.43* 0.47 0.49 0.62  CALCIUM 7.4* 8.6* 8.6* 8.9  MG 1.8  --   --   --   PHOS 2.6  --   --  3.5   Liver Function Tests: Recent Labs  Lab 09/01/20 0928 09/03/20 0523 09/05/20 0514  AST 14* 15  --   ALT 12 14  --   ALKPHOS 62 70  --   BILITOT 0.6 0.5  --   PROT 5.6* 6.4*  --   ALBUMIN 2.5* 2.9* 3.1*   No results for input(s): LIPASE, AMYLASE in the last 168 hours. No results for input(s): AMMONIA in the last 168 hours. CBC: Recent Labs  Lab 09/01/20 0928 09/02/20 0557 09/03/20 0523 09/05/20 0514  WBC 7.7 8.3 7.4 8.2  NEUTROABS 6.3  --  5.2 6.4  HGB 9.4* 11.3* 11.1* 11.5*  HCT 30.3* 34.4* 33.9* 34.8*  MCV 94.1 89.8 90.4 89.9  PLT 338 404* 422* 445*   Cardiac Enzymes: No results for input(s): CKTOTAL, CKMB, CKMBINDEX, TROPONINI in the last 168 hours. BNP: BNP (last 3 results) No results for input(s): BNP in the last 8760 hours.  ProBNP (last 3 results) No results for input(s): PROBNP in the last 8760 hours.  CBG: No results for input(s): GLUCAP in the last 168 hours.     Signed:  Nita Sells MD   [facility] 09/05/2020, 3:31 PM

## 2020-09-05 NOTE — Consult Note (Signed)
Consultation Note Date: 09/05/2020   Patient Name: Cynthia Walker  DOB: 12-24-1934  MRN: [id number]  Age / Sex: 85 y.o., female  PCP: Sofie Hartigan, MD Referring Physician: Nita Sells, MD  Reason for Consultation: Establishing goals of care and Psychosocial/spiritual support  HPI/Patient Profile: 85 y.o. female  with past medical history of right breast lumpectomy, radiation and chemotherapy, dementia, hypertension who is brought to the emergency department from home via EMS due to complaints of generalized weakness, abdominal pain and constipation admitted on 09/01/2020 with urinary infection, severe constipation/abdominal pain, moderate to severe dementia worsening.   Clinical Assessment and Goals of Care: Mrs. and Mr. Hinch are known to the palliative medicine team.  I have reviewed medical records including EPIC notes, labs and imaging, examined the patient and met at bedside with Mr. Leitzke to discuss diagnosis prognosis, [place], EOL wishes, disposition and options.   I introduced Palliative Medicine as specialized medical care for people living with serious illness. It focuses on providing relief from the symptoms and stress of a serious illness.   Mrs. Friedl is lying quietly in bed. She appears acutely/chronically ill and quite frail. She will briefly make but not keep eye contact. I do not believe that she is able to make her basic needs known with her advancing dementia. She has orbital wasting. Nursing staff is just leaving after completing a Covid swab. Mr. Cotten shares his displeasure, but I reassure him that we understand his concerns, but must meet hospital requirements.   As far as functional and nutritional status, Mr. Stipp goes on at length about his equipment and modifications that he has made in order to continue to care for Mrs. Anwar at home.  We discussed her current  illness and what it means in the larger context of her on-going co-morbidities.  Natural disease trajectory and expectations at EOL were discussed. Mr. Prehn shares that the attending hospitalist has discussed hospice care. I share that hospice would be a loving way to care for Mrs. Sabra Heck, we talked about what they will provide at home, with the goal of keeping her at home. Mr. Rinks is tearful and states that his goal is to have as much meaningful time with her as possible.  Hospice and Palliative Care services outpatient were explained and offered. Outpatient palliative services were discussed and encouraged when last seen by palliative team.  Mr. Doster was to accept outpatient palliative services, but it seems that this did not occur.  Questions and concerns were addressed.     Conference with attending, bedside nursing staff related to patient condition, needs, goals of care.   HCPOA    NEXT OF KIN -spouse, Benna Arno    SUMMARY OF RECOMMENDATIONS   At this point continue to treat the treatable but no CPR or intubation Agreeable to short-term rehab Outpatient palliative to follow   Code Status/Advance Care Planning:  DNR  Symptom Management:   Per hospitalist, no additional needs at this time.   Palliative Prophylaxis:   Oral Care  and Turn Reposition  Additional Recommendations (Limitations, Scope, Preferences):  treat the treatable, but no CPR or intubation   Psycho-social/Spiritual:   Desire for further Chaplaincy support:no  Additional Recommendations: Caregiving  Support/Resources and Education on Hospice  Prognosis:   Unable to determine, based on outcomes. 6 months or less would not be surprising based on chronic illness burden, frailty.   Discharge Planning: STR anticipate need for LTC      Primary Diagnoses: Present on Admission: . Abdominal pain . Essential hypertension . Normocytic anemia . Dementia ([place]) . Moderate protein malnutrition  ([place]) . Hypokalemia . Constipation . Metabolic encephalopathy   I have reviewed the medical record, interviewed the patient and family, and examined the patient. The following aspects are pertinent.  Past Medical History:  Diagnosis Date  . Breast cancer [facility]) 2009   Right breast CA with lumpectomy, radiation and chemo tx's.  . Dementia ([place])   . Hypertension   . Personal history of chemotherapy   . Personal history of radiation therapy    Social History   Socioeconomic History  . Marital status: Married    Spouse name: Not on file  . Number of children: Not on file  . Years of education: Not on file  . Highest education level: Not on file  Occupational History  . Not on file  Tobacco Use  . Smoking status: Never Smoker  . Smokeless tobacco: Never Used  Substance and Sexual Activity  . Alcohol use: No  . Drug use: No  . Sexual activity: Not on file  Other Topics Concern  . Not on file  Social History Narrative  . Not on file   Social Determinants of Health   Financial Resource Strain:   . Difficulty of Paying Living Expenses: Not on file  Food Insecurity:   . Worried About Charity fundraiser in the Last Year: Not on file  . Ran Out of Food in the Last Year: Not on file  Transportation Needs:   . Lack of Transportation (Medical): Not on file  . Lack of Transportation (Non-Medical): Not on file  Physical Activity:   . Days of Exercise per Week: Not on file  . Minutes of Exercise per Session: Not on file  Stress:   . Feeling of Stress : Not on file  Social Connections:   . Frequency of Communication with Friends and Family: Not on file  . Frequency of Social Gatherings with Friends and Family: Not on file  . Attends Religious Services: Not on file  . Active Member of Clubs or Organizations: Not on file  . Attends Archivist Meetings: Not on file  . Marital Status: Not on file   Family History  Problem Relation Age of Onset  . Cancer Brother         lung cancer  . Cancer Sister        not sure  . Breast cancer Sister   . Heart disease Father   . Hypertension Father    Scheduled Meds: . bisacodyl  5 mg Oral Daily  . enoxaparin (LOVENOX) injection  40 mg Subcutaneous Q24H  . feeding supplement  1 Container Oral TID BM  . magnesium oxide  200 mg Oral Daily  . senna-docusate  1 tablet Oral BID   Continuous Infusions: PRN Meds:.acetaminophen **OR** acetaminophen, ondansetron **OR** ondansetron (ZOFRAN) IV Medications Prior to Admission:  Prior to Admission medications   Medication Sig Start Date End Date Taking? Authorizing Provider  donepezil (ARICEPT ODT)  10 MG disintegrating tablet Take 10 mg by mouth at bedtime.   Yes [provider]  Multiple Vitamins-Minerals (ADULT GUMMY) CHEW Chew 1 tablet by mouth daily.   Yes [provider]  polyethylene glycol (MIRALAX / GLYCOLAX) 17 g packet Take 17 g by mouth daily as needed for mild constipation or moderate constipation.   Yes [provider]   Allergies  Allergen Reactions  . Flu Virus Vaccine     Other reaction(s): Other (See Comments) Stomach cramps  . Latex Swelling  . Other     Other reaction(s): Unknown Allergy to eggs  . Procaine Other (See Comments)    weakness  . Soy Isoflavones     Other reaction(s): Other (See Comments) Soy causes legs to feel like rubber  . Penicillins Rash    Has patient had a PCN reaction causing immediate rash, facial/tongue/throat swelling, SOB or lightheadedness with hypotension: No Has patient had a PCN reaction causing severe rash involving mucus membranes or skin necrosis: No Has patient had a PCN reaction that required hospitalization: No Has patient had a PCN reaction occurring within the last 10 years: No If all of the above answers are "NO", then may proceed with Cephalosporin use.    Review of Systems  Unable to perform ROS: Dementia    Physical Exam Vitals and nursing note reviewed.    Constitutional:      General: She is not in acute distress.    Appearance: She is ill-appearing.     Comments: No memory loss, will briefly make but not keep eye contact, orbital wasting  HENT:     Head: Atraumatic.  Cardiovascular:     Rate and Rhythm: Normal rate.  Pulmonary:     Effort: Pulmonary effort is normal. No respiratory distress.  Abdominal:     General: Abdomen is flat.     Palpations: Abdomen is soft.  Skin:    General: Skin is warm and dry.  Neurological:     Mental Status: She is alert.     Comments: Known memory loss  Psychiatric:     Comments: Calm and cooperative mostly     Vital Signs: BP 109/60 (BP Location: Left Arm)   Pulse 81   Temp 98.3 F (36.8 C)   Resp 18   Ht _0  (1.626 m)   Wt 52.5 kg   SpO2 96%   BMI 19.86 kg/m  Pain Scale: 0-10 POSS *See Group Information*: S-Acceptable,Sleep, easy to arouse Pain Score: 0-No pain   SpO2: SpO2: 96 % O2 Device:SpO2: 96 % O2 Flow Rate: .   IO: Intake/output summary:   Intake/Output Summary (Last 24 hours) at 09/05/2020 1436 Last data filed at 09/05/2020 0932 Gross per 24 hour  Intake 574 ml  Output 1050 ml  Net -476 ml    LBM: Last BM Date: 09/04/20 Baseline Weight: Weight: 59.4 kg Most recent weight: Weight: 52.5 kg     Palliative Assessment/Data:   Flowsheet Rows     Most Recent Value  Intake Tab  Referral Department Hospitalist  Unit at Time of Referral Med/Surg Unit  Palliative Care Primary Diagnosis Neurology  Date Notified 09/04/20  Palliative Care Type Return patient Palliative Care  Reason for referral Clarify Goals of Care  Date of Admission 09/01/20  Date first seen by Palliative Care 09/05/20  # of days Palliative referral response time 1 Day(s)  # of days IP prior to Palliative referral 3  Clinical Assessment  Palliative Performance Scale Score 30%  Pain Max last 24 hours Not able to report  Pain Min Last 24 hours Not able to report  Dyspnea Max Last 24 Hours Not  able to report  Dyspnea Min Last 24 hours Not able to report  Psychosocial & Spiritual Assessment  Palliative Care Outcomes      Time In: 1520 Time Out: 1610 Time Total: 50 minutes Greater than 50%  of this time was spent counseling and coordinating care related to the above assessment and plan.  Signed by: Drue Novel, NP   Please contact Palliative Medicine Team phone at [phone] for questions and concerns.  For individual provider: See Amion 50

## 2020-09-05 NOTE — TOC Progression Note (Signed)
Transition of Care [facility]) - Progression Note    Patient Details  Name: Cynthia Walker MRN: [id number] Date of Birth: April 09, 1935  Transition of Care [facility]) CM/SW Jim [place], RN Phone Number: 09/05/2020, 1:15 PM  Clinical Narrative:   Patient alert this  Morning to name, husband at bedside. Husband voices concern with caring for patient at home. Husband states he is unable to provide care needed for ADL's , transfers, toileting nor food preparation. Prefers that patient be transferred to SNF, where she can receive the care needed and he will be able to visit. Husband is informed that the SNF process will be implemented today and he will be informed of outcome.         Expected Discharge Plan and Services                                                 Social Determinants of Health (SDOH) Interventions    Readmission Risk Interventions No flowsheet data found.

## 2020-09-06 ENCOUNTER — Ambulatory Visit: Payer: Medicare HMO | Admitting: Physical Therapy

## 2020-09-06 DIAGNOSIS — R1084 Generalized abdominal pain: Secondary | ICD-10-CM | POA: Diagnosis not present

## 2020-09-06 DIAGNOSIS — K5900 Constipation, unspecified: Secondary | ICD-10-CM | POA: Diagnosis not present

## 2020-09-06 DIAGNOSIS — F0390 Unspecified dementia without behavioral disturbance: Secondary | ICD-10-CM | POA: Diagnosis not present

## 2020-09-06 LAB — CBC WITH DIFFERENTIAL/PLATELET
Abs Immature Granulocytes: 0.04 10*3/uL (ref 0.00–0.07)
Basophils Absolute: 0 10*3/uL (ref 0.0–0.1)
Basophils Relative: 0 %
Eosinophils Absolute: 0.1 10*3/uL (ref 0.0–0.5)
Eosinophils Relative: 1 %
HCT: 33.7 % — ABNORMAL LOW (ref 36.0–46.0)
Hemoglobin: 11.2 g/dL — ABNORMAL LOW (ref 12.0–15.0)
Immature Granulocytes: 1 %
Lymphocytes Relative: 14 %
Lymphs Abs: 1.1 10*3/uL (ref 0.7–4.0)
MCH: 29.6 pg (ref 26.0–34.0)
MCHC: 33.2 g/dL (ref 30.0–36.0)
MCV: 89.2 fL (ref 80.0–100.0)
Monocytes Absolute: 0.7 10*3/uL (ref 0.1–1.0)
Monocytes Relative: 9 %
Neutro Abs: 5.7 10*3/uL (ref 1.7–7.7)
Neutrophils Relative %: 75 %
Platelets: 418 10*3/uL — ABNORMAL HIGH (ref 150–400)
RBC: 3.78 MIL/uL — ABNORMAL LOW (ref 3.87–5.11)
RDW: 13.7 % (ref 11.5–15.5)
WBC: 7.7 10*3/uL (ref 4.0–10.5)
nRBC: 0 % (ref 0.0–0.2)

## 2020-09-06 NOTE — Progress Notes (Signed)
PROGRESS NOTE    Cynthia Walker   FYB:[id number]  DOB: [birth year]  PCP: Sofie Hartigan, MD    DOA: 09/01/2020 LOS: 4   Brief Narrative   Timeline Admission 5/12 through 02/21/2020 2/2 concern for bacteremia ultimately felt to have colonization Admission 9/19 through 07/01/2019 hip fracture status post repair Admission 1222 09/30/2017 hypotension and echocardiogram time no abnormality telemetry normal Admission 2/8-2/07/2013 diverticulitis   HPI and hospital course from Dr. Verlon Au 55/69: "85 year old home dwelling WF Right breast cancer with lumpectomy 2009 status post XRT, chemo HTN, moderate dementia came to ED with generalized weakness abdominal pain and severe constipation X 2 weeks Recent Rx for UTI WBC 7 hemoglobin 9 potassium 3.4-CT abdomen pelvis? Right fifth rib erosion? Spread of malignancy"      Assessment & Plan   Principal Problem:   Abdominal pain Active Problems:   Essential hypertension   Normocytic anemia   Dementia (HCC)   Moderate protein malnutrition (HCC)   Hypokalemia   Constipation   Metabolic encephalopathy  Patient is medically stable for discharge to SNF pending insurance authorization.  Unchanged assessment and plan since note of 11/30 by Dr. Verlon Au: "Recent Rx urinary infection ciprofloxacin 08/14/2020 a. Low suspicion of infection as recent treatment with Cipro completed 6 days per husband b. No further work-up as UA is negative 1. Severe constipation abdominal pain a. magnesium citrate in addition to Dulcolax p.o. MiraLAX and senna b. Start fleets enema--1 stool documented on output  c. continue d. Oral regimen with MiraLAX and senna on discharge 2. Mild hypokalemia on admission a. Resolved b. DC fluids and monitor trends 3. Breast cancer status post lumpectomy/XRT with concerns on admission this admission for malignancy spread a. Probably has spread to rib b. unlikely good candidate for systemic therapies 4. HTN a. Not  on meds and would probably withhold treatment given severe dementia 5. Moderate to severe dementia worsening with excessive somnolence over several months per office note 08/01/2020 a. Therapy has evaluated the patient but not sure how much she would participate with skilled b. We will ask palliative care to help with disposition-may need palliative care to follow at facility"  Patient BMI: Body mass index is 19.86 kg/m.   DVT prophylaxis: enoxaparin (LOVENOX) injection 40 mg Start: 09/01/20 2000   Diet:  Diet Orders (From admission, onward)    Start     Ordered   09/05/20 0000  Diet - low sodium heart healthy        09/05/20 1528   09/02/20 0831  Diet regular Room service appropriate? Yes; Fluid consistency: Thin  Diet effective now       Question Answer Comment  Room service appropriate? Yes   Fluid consistency: Thin      09/02/20 0830            Code Status: DNR    Subjective 09/06/20    Patient sleeping comfortably when seen today, husband was at bedside.  He spoke at length about their 70 years together.  Patient does respond to verbal and tactile stimulus but does not stay awake for long.  When awake she denies any pain or discomfort.   Disposition Plan & Communication   Status is: Inpatient  Remains inpatient appropriate because:Inpatient level of care appropriate due to severity of illness and And adequate caregiver support at home, requires SNF placement insurance office pending   Dispo: The patient is from: Home              Anticipated  d/c is to: SNF              Anticipated d/c date is: 1 day              Patient currently is medically stable to d/c.        Family Communication: Husband is at bedside on rounds today   Consults, Procedures, Significant Events   Consultants:   None  Procedures:   None  Antimicrobials:  Anti-infectives (From admission, onward)   None         Objective   Vitals:   09/06/20 0438 09/06/20 0757 09/06/20  1131 09/06/20 1536  BP: 130/79 129/85 113/63 128/73  Pulse: 78 90 85 87  Resp: 20 16 18 18   Temp: 98.1 F (36.7 C) 99.4 F (37.4 C) 99.3 F (37.4 C) 98.1 F (36.7 C)  TempSrc:  Oral Axillary Oral  SpO2: 98% 97% 97% 99%  Weight:      Height:        Intake/Output Summary (Last 24 hours) at 09/06/2020 1546 Last data filed at 09/06/2020 1200 Gross per 24 hour  Intake 120 ml  Output --  Net 120 ml   Filed Weights   09/03/20 0650 09/04/20 0100 09/05/20 0431  Weight: 54.6 kg 55.8 kg 52.5 kg    Physical Exam:  General exam: Sleeping comfortably, no acute distress Respiratory system: CTAB, no wheezes, rales or rhonchi, normal respiratory effort. Cardiovascular system: normal S1/S2, RRR, no pedal edema.   Gastrointestinal system: soft, NT, ND, +bowel sounds, no suprapubic tenderness.  Labs   Data Reviewed: I have personally reviewed following labs and imaging studies  CBC: Recent Labs  Lab 09/01/20 0928 09/02/20 0557 09/03/20 0523 09/05/20 0514 09/06/20 0634  WBC 7.7 8.3 7.4 8.2 7.7  NEUTROABS 6.3  --  5.2 6.4 5.7  HGB 9.4* 11.3* 11.1* 11.5* 11.2*  HCT 30.3* 34.4* 33.9* 34.8* 33.7*  MCV 94.1 89.8 90.4 89.9 89.2  PLT 338 404* 422* 445* 254*   Basic Metabolic Panel: Recent Labs  Lab 09/01/20 0928 09/02/20 0557 09/03/20 0523 09/05/20 0514  NA 137 135 139 135  K 3.4* 4.0 4.2 4.0  CL 106 101 103 98  CO2 22 25 25 26   GLUCOSE 94 121* 102* 126*  BUN 8 <5* 5* 11  CREATININE 0.43* 0.47 0.49 0.62  CALCIUM 7.4* 8.6* 8.6* 8.9  MG 1.8  --   --   --   PHOS 2.6  --   --  3.5   GFR: Estimated Creatinine Clearance: 44.2 mL/min (by C-G formula based on SCr of 0.62 mg/dL). Liver Function Tests: Recent Labs  Lab 09/01/20 0928 09/03/20 0523 09/05/20 0514  AST 14* 15  --   ALT 12 14  --   ALKPHOS 62 70  --   BILITOT 0.6 0.5  --   PROT 5.6* 6.4*  --   ALBUMIN 2.5* 2.9* 3.1*   No results for input(s): LIPASE, AMYLASE in the last 168 hours. No results for input(s):  AMMONIA in the last 168 hours. Coagulation Profile: No results for input(s): INR, PROTIME in the last 168 hours. Cardiac Enzymes: No results for input(s): CKTOTAL, CKMB, CKMBINDEX, TROPONINI in the last 168 hours. BNP (last 3 results) No results for input(s): PROBNP in the last 8760 hours. HbA1C: No results for input(s): HGBA1C in the last 72 hours. CBG: No results for input(s): GLUCAP in the last 168 hours. Lipid Profile: No results for input(s): CHOL, HDL, LDLCALC, TRIG, CHOLHDL, LDLDIRECT in the last  72 hours. Thyroid Function Tests: No results for input(s): TSH, T4TOTAL, FREET4, T3FREE, THYROIDAB in the last 72 hours. Anemia Panel: No results for input(s): VITAMINB12, FOLATE, FERRITIN, TIBC, IRON, RETICCTPCT in the last 72 hours. Sepsis Labs: No results for input(s): PROCALCITON, LATICACIDVEN in the last 168 hours.  Recent Results (from the past 240 hour(s))  SARS CORONAVIRUS 2 (TAT 6-24 HRS) Nasopharyngeal Nasopharyngeal Swab     Status: None   Collection Time: 09/02/20  8:12 PM   Specimen: Nasopharyngeal Swab  Result Value Ref Range Status   SARS Coronavirus 2 NEGATIVE NEGATIVE Final    Comment: (NOTE) SARS-CoV-2 target nucleic acids are NOT DETECTED.  The SARS-CoV-2 RNA is generally detectable in upper and lower respiratory specimens during the acute phase of infection. Negative results do not preclude SARS-CoV-2 infection, do not rule out co-infections with other pathogens, and should not be used as the sole basis for treatment or other patient management decisions. Negative results must be combined with clinical observations, patient history, and epidemiological information. The expected result is Negative.  Fact Sheet for Patients: SugarRoll.be  Fact Sheet for Healthcare Providers: https://www.woods-mathews.com/  This test is not yet approved or cleared by the [place] FDA and  has been authorized for detection and/or  diagnosis of SARS-CoV-2 by FDA under an Emergency Use Authorization (EUA). This EUA will remain  in effect (meaning this test can be used) for the duration of the COVID-19 declaration under Se ction 564(b)(1) of the Act, 21 U.S.C. section 360bbb-3(b)(1), unless the authorization is terminated or revoked sooner.  Performed at [facility], [place] [address] [place], [place] [address]   SARS Coronavirus 2 by RT PCR (hospital order, performed in [facility] hospital lab) Nasopharyngeal Nasopharyngeal Swab     Status: None   Collection Time: 09/03/20 10:00 AM   Specimen: Nasopharyngeal Swab  Result Value Ref Range Status   SARS Coronavirus 2 NEGATIVE NEGATIVE Final    Comment: (NOTE) SARS-CoV-2 target nucleic acids are NOT DETECTED.  The SARS-CoV-2 RNA is generally detectable in upper and lower respiratory specimens during the acute phase of infection. The lowest concentration of SARS-CoV-2 viral copies this assay can detect is 250 copies / mL. A negative result does not preclude SARS-CoV-2 infection and should not be used as the sole basis for treatment or other patient management decisions.  A negative result may occur with improper specimen collection / handling, submission of specimen other than nasopharyngeal swab, presence of viral mutation(s) within the areas targeted by this assay, and inadequate number of viral copies (<250 copies / mL). A negative result must be combined with clinical observations, patient history, and epidemiological information.  Fact Sheet for Patients:   StrictlyIdeas.no  Fact Sheet for Healthcare Providers: BankingDealers.co.za  This test is not yet approved or  cleared by the [place] FDA and has been authorized for detection and/or diagnosis of SARS-CoV-2 by FDA under an Emergency Use Authorization (EUA).  This EUA will remain in effect (meaning this test can be used) for the duration of  the COVID-19 declaration under Section 564(b)(1) of the Act, 21 U.S.C. section 360bbb-3(b)(1), unless the authorization is terminated or revoked sooner.  Performed at [facility], [address] [place], [place] [address]   SARS Coronavirus 2 by RT PCR (hospital order, performed in [facility] hospital lab) Nasopharyngeal Nasopharyngeal Swab     Status: None   Collection Time: 09/05/20  3:35 PM   Specimen: Nasopharyngeal Swab  Result Value Ref Range Status  SARS Coronavirus 2 NEGATIVE NEGATIVE Final    Comment: (NOTE) SARS-CoV-2 target nucleic acids are NOT DETECTED.  The SARS-CoV-2 RNA is generally detectable in upper and lower respiratory specimens during the acute phase of infection. The lowest concentration of SARS-CoV-2 viral copies this assay can detect is 250 copies / mL. A negative result does not preclude SARS-CoV-2 infection and should not be used as the sole basis for treatment or other patient management decisions.  A negative result may occur with improper specimen collection / handling, submission of specimen other than nasopharyngeal swab, presence of viral mutation(s) within the areas targeted by this assay, and inadequate number of viral copies (<250 copies / mL). A negative result must be combined with clinical observations, patient history, and epidemiological information.  Fact Sheet for Patients:   StrictlyIdeas.no  Fact Sheet for Healthcare Providers: BankingDealers.co.za  This test is not yet approved or  cleared by the [place] FDA and has been authorized for detection and/or diagnosis of SARS-CoV-2 by FDA under an Emergency Use Authorization (EUA).  This EUA will remain in effect (meaning this test can be used) for the duration of the COVID-19 declaration under Section 564(b)(1) of the Act, 21 U.S.C. section 360bbb-3(b)(1), unless the authorization is terminated or revoked  sooner.  Performed at [facility], [address] [place], [place] [address]       Imaging Studies   No results found.   Medications   Scheduled Meds: . bisacodyl  5 mg Oral Daily  . enoxaparin (LOVENOX) injection  40 mg Subcutaneous Q24H  . feeding supplement  1 Container Oral TID BM  . magnesium oxide  200 mg Oral Daily  . senna-docusate  1 tablet Oral BID   Continuous Infusions:     LOS: 4 days    Time spent: 45 minutes with greater than 50% spent at bedside and in coordination of care    Ezekiel Slocumb, DO [facility]  09/06/2020, 3:46 PM    If 7PM-7AM, please contact night-coverage. How to contact the [facility] Attending or Consulting provider [place] or covering provider during after hours [place], for this patient?    1. Check the care team in [facility] and look for a) attending/consulting TRH provider listed and b) the [facility] team listed 2. Log into [email or web address] and use [place]'s universal password to access. If you do not have the password, please contact the hospital operator. 3. Locate the [facility] provider you are looking for under [facility] and page to a number that you can be directly reached. 4. If you still have difficulty reaching the provider, please page the [facility] (Director on Call) for the Hospitalists listed on amion for assistance.

## 2020-09-06 NOTE — TOC Initial Note (Signed)
Transition of Care [facility]) - Initial/Assessment Note    Patient Details  Name: Cynthia Walker MRN: [id number] Date of Birth: Dec 04, 1934  [facility] CM/SW Contact:    Shelbie Hutching, RN Phone Number: 09/06/2020, 10:09 AM  Clinical Narrative:                 Patient admitted to the hospital with abdominal pain and constipation.  RNCM met with patient and husband at the bedside.  Husband, Delfino Lovett, reports that he cares for the patient at home and he has seen a decline recently.  Patient is mostly in a wheelchair and he has a wheelchair accessible [employer].  Patient has been going to outpatient therapy and husband reports that she did improve with therapy.   Husband would like for the patient to go for short term rehab over at [facility].  Jane[facility] Rig[facility] has offered a bed and will start insurance authorization.   Once insurance auth completed patient is medically cleared for discharge.    Expected Discharge Plan: Skilled Nursing Facility Barriers to Discharge: Insurance Authorization   Patient Goals and CMS Choice Patient states their goals for this hospitalization and ongoing recovery are:: Husband would like for patient to go for short term theapy at [facility].gov Compare Post Acute Care list provided to:: Patient Represenative (must comment) Choice offered to / list presented to : Spouse  Expected Discharge Plan and Services Expected Discharge Plan: [place]   Discharge Planning Services: CM Consult Post Acute Care Choice: [place] Living arrangements for the past 2 months: Single Family Home Expected Discharge Date: 09/06/20                 DME Agency: NA       HH Arranged: NA          Prior Living Arrangements/Services Living arrangements for the past 2 months: Single Family Home   Patient language and need for interpreter reviewed:: Yes Do you feel safe going back to the place where you live?: Yes      Need  for Family Participation in Patient Care: Yes (Comment) (dementia) Care giver support system in place?: Yes (comment) (husband and son) Current home services: DME (wheelchair, bedside Commode, walker) Criminal Activity/Legal Involvement Pertinent to Current Situation/Hospitalization: No - Comment as needed  Activities of Daily Living Home Assistive Devices/Equipment: Wheelchair ADL Screening (condition at time of admission) Patient's cognitive ability adequate to safely complete daily activities?: No Is the patient deaf or have difficulty hearing?: No Does the patient have difficulty seeing, even when wearing glasses/contacts?: No Does the patient have difficulty concentrating, remembering, or making decisions?: Yes Patient able to express need for assistance with ADLs?: No Does the patient have difficulty dressing or bathing?: Yes Independently performs ADLs?: No Communication: Dependent Is this a change from baseline?: Pre-admission baseline Dressing (OT): Dependent Is this a change from baseline?: Pre-admission baseline Grooming: Dependent Is this a change from baseline?: Pre-admission baseline Feeding: Dependent Is this a change from baseline?: Pre-admission baseline Bathing: Dependent Is this a change from baseline?: Pre-admission baseline Toileting: Dependent Is this a change from baseline?: Pre-admission baseline In/Out Bed: Dependent Is this a change from baseline?: Pre-admission baseline Walks in Home: Dependent Is this a change from baseline?: Pre-admission baseline Does the patient have difficulty walking or climbing stairs?: Yes Weakness of Legs: Both Weakness of Arms/Hands: Both  Permission Sought/Granted Permission sought to share information with : Case Freight forwarder, Customer service manager, Family Supports Permission  granted to share information with : Yes, Verbal Permission Granted  Share Information with NAME: Delfino Lovett  Permission granted to share info w  AGENCY: Dietitian granted to share info w Relationship: husband     Emotional Assessment Appearance:: Appears older than stated age Attitude/Demeanor/Rapport: Unable to Assess Affect (typically observed): Unable to Assess Orientation: : Oriented to Self Alcohol / Substance Use: Not Applicable Psych Involvement: No (comment)  Admission diagnosis:  Generalized weakness [R53.1] Abdominal pain [R10.9] Constipation, unspecified constipation type [V40.98] Metabolic encephalopathy [J19.14] Patient Active Problem List   Diagnosis Date Noted  . Metabolic encephalopathy 78/29/5621  . Abdominal pain 09/01/2020  . Dementia ([place])   . Moderate protein malnutrition ([place])   . Hypokalemia   . Constipation   . Normocytic anemia 04/26/2020  . Encounter for hospice care discussion   . MRSA bacteremia 02/17/2020  . Streptococcal bacteremia 02/17/2020  . Bacteremia 02/17/2020  . History of breast cancer 02/17/2020  . Goals of care, counseling/discussion   . Palliative care by specialist   . DNR (do not resuscitate) discussion   . Breast pain, right 01/11/2020  . Femoral neck fracture ([place]) 06/27/2019  . Femur fracture, left ([place]) 06/26/2019  . Right lower lobe pulmonary nodule 05/04/2019  . Syncope 09/27/2017  . Atypical chest pain 09/03/2017  . Essential hypertension 09/03/2017  . Short-term memory loss 09/03/2017  . Osteoporosis 04/02/2016  . Breast cancer, right ([place]) 04/06/2008   PCP:  Sofie Hartigan, MD Pharmacy:   [id number] [place] [address]Phone: [phone]: [phone]    Social Determinants of Health (SDOH) Interventions    Readmission Risk Interventions No flowsheet data found.

## 2020-09-07 DIAGNOSIS — K5900 Constipation, unspecified: Secondary | ICD-10-CM | POA: Diagnosis not present

## 2020-09-07 DIAGNOSIS — F0390 Unspecified dementia without behavioral disturbance: Secondary | ICD-10-CM | POA: Diagnosis not present

## 2020-09-07 DIAGNOSIS — R1084 Generalized abdominal pain: Secondary | ICD-10-CM | POA: Diagnosis not present

## 2020-09-07 LAB — CBC WITH DIFFERENTIAL/PLATELET
Abs Immature Granulocytes: 0.04 10*3/uL (ref 0.00–0.07)
Basophils Absolute: 0 10*3/uL (ref 0.0–0.1)
Basophils Relative: 0 %
Eosinophils Absolute: 0.1 10*3/uL (ref 0.0–0.5)
Eosinophils Relative: 1 %
HCT: 34 % — ABNORMAL LOW (ref 36.0–46.0)
Hemoglobin: 10.9 g/dL — ABNORMAL LOW (ref 12.0–15.0)
Immature Granulocytes: 1 %
Lymphocytes Relative: 13 %
Lymphs Abs: 1.2 10*3/uL (ref 0.7–4.0)
MCH: 29.2 pg (ref 26.0–34.0)
MCHC: 32.1 g/dL (ref 30.0–36.0)
MCV: 91.2 fL (ref 80.0–100.0)
Monocytes Absolute: 0.8 10*3/uL (ref 0.1–1.0)
Monocytes Relative: 9 %
Neutro Abs: 6.6 10*3/uL (ref 1.7–7.7)
Neutrophils Relative %: 76 %
Platelets: 435 10*3/uL — ABNORMAL HIGH (ref 150–400)
RBC: 3.73 MIL/uL — ABNORMAL LOW (ref 3.87–5.11)
RDW: 13.9 % (ref 11.5–15.5)
WBC: 8.6 10*3/uL (ref 4.0–10.5)
nRBC: 0 % (ref 0.0–0.2)

## 2020-09-07 MED ORDER — DONEPEZIL HCL 5 MG PO TABS
10.0000 mg | ORAL_TABLET | Freq: Every day | ORAL | Status: DC
  Administered 2020-09-08 – 2020-09-11 (×3): 10 mg via ORAL
  Filled 2020-09-07 (×5): qty 2

## 2020-09-07 NOTE — Progress Notes (Signed)
Physical Therapy Treatment Patient Details Name: Cynthia Walker MRN: [id number] DOB: Feb 04, 1935 Today's Date: 09/07/2020    History of Present Illness Pt is an 85 y/o F With PMH: R BRCA s/p lumpectomy, XRT and Chemo 2009 as well as HTN and moderate dementia. Had IM nailing after fall to L hip 06/2019. Pt presented to ED d/t abdominal pain, weakness, and constipation x2 weeks. Low probablility of infection as pt just finished Rx for UTI. Concerns for cancer mets to R fifth rib per MD note.    PT Comments    Patient alert, family at bedside. Did not display signs/symptoms of pain at rest, but with any attempts at mobility exhibited grimacing/crying, complaints of pain. Pt was totalA for bed mobility for placement of hoyer lift sling, and was placed in recliner. 3 attempts at sit <> stand transfers made with extensive multimodal cueing to maximize pt participation and comprehension. With second assist to stabilize RW, pt needed her hands physically placed on RW, and almost constant maxA-totalA to sit without back support in preparation to stand. TotalXA to attempt to come to standing, pt resistant to movement and very fearful. Pt was able to sit without physical assist ~10seconds once. Repositioned in chair with all needs in reach and pt sleeping at end of session. Pt displayed limited progression towards goals, remains appropriate for trial of PT services to assess ability to participate with therapy services.       Follow Up Recommendations  SNF (Pt placed on trial pending her ability/willingness to participate with therapy for 3-5 sessions)     Equipment Recommendations  Other (comment) (TBD at next venue care)    Recommendations for Other Services OT consult     Precautions / Restrictions Precautions Precautions: Fall Restrictions Weight Bearing Restrictions: No    Mobility  Bed Mobility               General bed mobility comments: hoyer lift to recliner in room (baseline  level functioning)  Transfers                    Ambulation/Gait                 Stairs             Wheelchair Mobility    Modified Rankin (Stroke Patients Only)       Balance Overall balance assessment: Needs assistance Sitting-balance support: Bilateral upper extremity supported;Feet supported Sitting balance-Leahy Scale: Zero Sitting balance - Comments: maxA to maintain sitting despite BUE propping, pt with heavy posterior lean. ~10seconds pt able to maintain seated position with CGA, remainder of attempts maxA-totalA Postural control: Posterior lean   Standing balance-Leahy Scale: Zero                              Cognition Arousal/Alertness: Awake/alert Behavior During Therapy: Anxious;Flat affect Overall Cognitive Status: History of cognitive impairments - at baseline                                        Exercises      General Comments        Pertinent Vitals/Pain Pain Assessment: Faces Faces Pain Scale: Hurts whole lot Pain Location: Pt reported that her bottom hurt Pain Descriptors / Indicators: Grimacing;Guarding;Crying Pain Intervention(s): Limited activity within patient's tolerance;Repositioned;Monitored during session  Home Living                      Prior Function            PT Goals (current goals can now be found in the care plan section) Progress towards PT goals: Progressing toward goals (slowly)    Frequency    Min 2X/week      PT Plan Current plan remains appropriate    Co-evaluation              AM-PAC PT "6 Clicks" Mobility   Outcome Measure  Help needed turning from your back to your side while in a flat bed without using bedrails?: A Lot Help needed moving from lying on your back to sitting on the side of a flat bed without using bedrails?: A Lot Help needed moving to and from a bed to a chair (including a wheelchair)?: Total Help needed standing up  from a chair using your arms (e.g., wheelchair or bedside chair)?: Total Help needed to walk in hospital room?: Total Help needed climbing 3-5 steps with a railing? : Total 6 Click Score: 8    End of Session Equipment Utilized During Treatment: Gait belt Activity Tolerance: Patient limited by fatigue;Patient limited by pain;Other (comment) (cognitive status) Patient left: with bed alarm set;with call bell/phone within reach;in bed Nurse Communication: Mobility status PT Visit Diagnosis: Other abnormalities of gait and mobility (R26.89);Muscle weakness (generalized) (M62.81)     Time: 5102-5852 PT Time Calculation (min) (ACUTE ONLY): 39 min  Charges:  $Therapeutic Exercise: 23-37 mins                     Lieutenant Diego PT, DPT 12:09 PM,09/07/20

## 2020-09-07 NOTE — Progress Notes (Signed)
Occupational Therapy Treatment Patient Details Name: Cynthia Walker MRN: [id number] DOB: [birth year] Today's Date: 09/07/2020    History of present illness Pt is an 85 y/o F With PMH: R BRCA s/p lumpectomy, XRT and Chemo 2009 as well as HTN and moderate dementia. Had IM nailing after fall to L hip 06/2019. Pt presented to ED d/t abdominal pain, weakness, and constipation x2 weeks. Low probablility of infection as pt just finished Rx for UTI. Concerns for cancer mets to R fifth rib per MD note.   OT comments  Pt seen for OT tx this date to f/u re: safety with ADLs/ADL mobility. OT facilitates pt sit to stand trials x2 with MAX verbal cues and ultimately TOTAL A to be successful on second attempt. OT educates pt's spouse and CNA re: safety and sequence to use equipment. Pt returned to bed with TOTAL A and requires MAX A/TOTAL A +2 for peri care following small episode of bowel incontinence. Pt was tearful with mobilization attempts, RN notified. Pt left in bed with bed alarm activated and CNA attending to other needs. Will continue to trial therapy, but anticipate that pt may require a more palliative approach if she is unable to progress with max therapy efforts.    Follow Up Recommendations  SNF    Equipment Recommendations  Hospital bed    Recommendations for Other Services      Precautions / Restrictions Precautions Precautions: Fall Restrictions Weight Bearing Restrictions: No       Mobility Bed Mobility Overal bed mobility: Needs Assistance             General bed mobility comments: lift back to bed with CNA assisting, cues for family and staff to assist with transition back to bed  Transfers Overall transfer level: Needs assistance   Transfers: Sit to/from Stand Sit to Stand: Total assist         General transfer comment: arm in arm, two trials, unsuccessful on first trial, requires manual placement of arms to participate, b/l knee blocking and arm in arm to  successfully total assist stand the second time.    Balance Overall balance assessment: Needs assistance Sitting-balance support: Bilateral upper extremity supported;Feet supported Sitting balance-Leahy Scale: Zero     Standing balance support: Bilateral upper extremity supported Standing balance-Leahy Scale: Zero Standing balance comment: very difficult total A stand, pt unable to contribute, lift used for back to bed for pt/therapist safety.                           ADL either performed or assessed with clinical judgement   ADL Overall ADL's : Needs assistance/impaired                             Toileting- Clothing Manipulation and Hygiene: Maximal assistance;Total assistance;+2 for physical assistance;Bed level Toileting - Clothing Manipulation Details (indicate cue type and reason): pt with bout of bowel incontinence during session       General ADL Comments: MOD A self-feeding at bed level - pt has strength to bring to mouth w/o assist but requires MOD A/tactile cues to self-feed c built up handle. MIN A wipe mouth at bed level. MAX A don B socks at bed level      Vision Patient Visual Report: No change from baseline     Perception     Praxis      Cognition Arousal/Alertness: Lethargic;Awake/alert Behavior  During Therapy: Flat affect Overall Cognitive Status: History of cognitive impairments - at baseline                                 General Comments: drowsy, responds to stimuli and follows <50% commands        Exercises Exercises: Other exercises Other Exercises Other Exercises: OT facilitates ed re: safety with lift with pt's spouse and CNA. Both parties demo good understanding. Other Exercises: Self-feeding, face washing, LBD   Shoulder Instructions       General Comments      Pertinent Vitals/ Pain       Pain Assessment: Faces Faces Pain Scale: Hurts even more Pain Location: pt tearful, difficulty  quantifying, indicating where with lift back to bed Pain Descriptors / Indicators: Grimacing;Guarding Pain Intervention(s): Limited activity within patient's tolerance;Repositioned  Home Living                                          Prior Functioning/Environment              Frequency  Min 1X/week        Progress Toward Goals  OT Goals(current goals can now be found in the care plan section)  Progress towards OT goals: Progressing toward goals  Acute Rehab OT Goals Patient Stated Goal: to get her stronger so she's better able to help me move her OT Goal Formulation: With family Time For Goal Achievement: 09/18/20 Potential to Achieve Goals: Fair ADL Goals Pt Will Perform Eating: with min assist;sitting (supported sitting) Pt Will Perform Grooming: with min assist;sitting (supported sitting) Pt/caregiver will Perform Home Exercise Program: Increased ROM;Increased strength;Both right and left upper extremity;With minimal assist Additional ADL Goal #1: Pt will tolerate ~5-6 mins EOB sitting with assist to increase fxl sitting tolerance.  Plan Discharge plan remains appropriate;Frequency remains appropriate;Other (comment) (Pt could potentially be more appropriate for a palliative route, will continue to assess)    Co-evaluation                 AM-PAC OT "6 Clicks" Daily Activity     Outcome Measure   Help from another person eating meals?: A Lot Help from another person taking care of personal grooming?: A Lot Help from another person toileting, which includes using toliet, bedpan, or urinal?: Total Help from another person bathing (including washing, rinsing, drying)?: Total Help from another person to put on and taking off regular upper body clothing?: A Lot Help from another person to put on and taking off regular lower body clothing?: A Lot 6 Click Score: 10    End of Session Equipment Utilized During Treatment: Gait belt;Other (comment)  (lift, lift pad)  OT Visit Diagnosis: Unsteadiness on feet (R26.81);Muscle weakness (generalized) (M62.81);History of falling (Z91.81)   Activity Tolerance Patient limited by fatigue;Patient limited by pain   Patient Left in bed;with call bell/phone within reach;with family/visitor present   Nurse Communication Other (comment) (discussed concerns with RN re: pt's rehab potential)        Time: 1233-1300 OT Time Calculation (min): 27 min  Charges: OT General Charges $OT Visit: 1 Visit OT Treatments $Self Care/Home Management : 8-22 mins $Therapeutic Activity: 8-22 mins  Gerrianne Scale, [place], OTR/L ascom [phone] 09/07/20, 5:03 PM

## 2020-09-07 NOTE — Progress Notes (Addendum)
PROGRESS NOTE    Cynthia Walker   WLN:[id number]  DOB: [birth year]  PCP: Sofie Hartigan, MD    DOA: 09/01/2020 LOS: 5   Brief Narrative   85 year old female with hx of right breast cancer with lumpectomy 2009 status post XRT, chemo HTN, moderate dementia came to ED with generalized weakness, abdominal pain and severe constipation X 2 weeks despite MiraLax at home.  Pt has become challenging for husband to care for at home since she has recently become unable to ambulate and requiring [place] lift for transfers.  Also with poor appetite.    Evaluation in the ED including CT abdomen/pelvis showed moderate stool volume in colon, no other acute findings.  There was however opacity in the RML with lateral right 5th rib erosion, concerning for malignancy.  Patient admitted and treated with aggressive bowel regimen, with resolution of constipation.  Remains on routine stool softeners.  PO intake has improved somewhat, but remains poor.  Palliative care was consulted and current goal of care is to attempt trial of rehab to see if patient's mobility able to improve, as husband would like to be able to continue caring for her at home.        Assessment & Plan   Principal Problem:   Abdominal pain Active Problems:   Essential hypertension   Normocytic anemia   Dementia (HCC)   Moderate protein malnutrition (HCC)   Hypokalemia   Constipation   Metabolic encephalopathy  Patient is medically stable for discharge to SNF pending insurance authorization.   Abdominal pain secondary to constipation -present on admission, resolved after aggressive bowel regimen. 12/2: Per husband, stool remains "dry and hard".  RN did report small BM earlier today. --Continue senna twice daily, Dulcolax daily --Will use suppository or enema as needed if no BM in 24 to 48 hours   Recent UTI -08/14/2020, treated with Cipro x6 days.  UA on admission was negative.  Hypokalemia -present on admission,  resolved after K was replaced.  Monitor BMP and replace as needed.  Breast cancer status post lumpectomy, XRT -concerning findings on CT scanned this admission involving right long and lateral fifth rib which appears eroded and concerning for malignancy.  Due to patient's advancing dementia and poor overall prognosis, no further evaluation at this time.  Patient would be a poor candidate for any intervention including surgery or chemo/radiation.  Hypertension -not on antihypertensives outpatient.  Monitor BP.  Moderate to severe dementia -appears recently progressing.  Has been wheelchair bound and dependent on [place] for transfers.  PT and OT recommended SNF and after palliative consultation, husband hopeful that patient would improve functionally with trial at rehab.  Insurance has unfortunately declined to cover SNF for rehab.  Will need to explore other options for adequate help in the home versus long-term placement.    Patient BMI: Body mass index is 19.86 kg/m.   DVT prophylaxis: enoxaparin (LOVENOX) injection 40 mg Start: 09/01/20 2000   Diet:  Diet Orders (From admission, onward)    Start     Ordered   09/05/20 0000  Diet - low sodium heart healthy        09/05/20 1528   09/02/20 0831  Diet regular Room service appropriate? Yes; Fluid consistency: Thin  Diet effective now       Question Answer Comment  Room service appropriate? Yes   Fluid consistency: Thin      09/02/20 0830            Code  Status: DNR    Subjective 09/07/20    Patient sleeping comfortably when seen today.  Husband again at bedside.  Patient had just been returned to bed after being up in the recliner chair with PT.  He states that she did eat and drink a fair amount of her meals today.  He reports her BM was very dry and still concern for constipation.  Patient minimally responsive, will not open eyes on command and was nonverbal.  She appears comfortable and did not exhibit any signs of discomfort  with abdominal palpation.  Disposition Plan & Communication   Status is: Inpatient  Remains inpatient appropriate because: Requires long-term placement or arrangement for adequate home health services and caregiver support.   Dispo: The patient is from: Home              Anticipated d/c is to: Home with home health versus long-term care              Anticipated d/c date is: 1 day              Patient currently is medically stable to d/c.   Family Communication: Husband is at bedside on rounds today   Consults, Procedures, Significant Events   Consultants:   None  Procedures:   None  Antimicrobials:  Anti-infectives (From admission, onward)   None         Objective   Vitals:   09/07/20 0005 09/07/20 0430 09/07/20 1211 09/07/20 1551  BP: (!) 142/91 (!) 110/96 96/83 122/68  Pulse: 99 91 91 93  Resp: 20 19 19 16   Temp: 98.3 F (36.8 C) 97.6 F (36.4 C) 98.4 F (36.9 C) 98 F (36.7 C)  TempSrc:   Oral Oral  SpO2: 97% 98% 100% 98%  Weight:      Height:        Intake/Output Summary (Last 24 hours) at 09/07/2020 1605 Last data filed at 09/07/2020 0500 Gross per 24 hour  Intake --  Output 300 ml  Net -300 ml   Filed Weights   09/03/20 0650 09/04/20 0100 09/05/20 0431  Weight: 54.6 kg 55.8 kg 52.5 kg    Physical Exam:  General exam: Sleeping comfortably, no acute distress Respiratory system: CTAB, no wheezes, rales or rhonchi, normal respiratory effort. Cardiovascular system: normal S1/S2, RRR, no pedal edema.   Gastrointestinal system: soft, NT, ND, +bowel sounds, no suprapubic tenderness.  Labs   Data Reviewed: I have personally reviewed following labs and imaging studies  CBC: Recent Labs  Lab 09/01/20 0928 09/01/20 0928 09/02/20 0557 09/03/20 0523 09/05/20 0514 09/06/20 0634 09/07/20 0443  WBC 7.7   < > 8.3 7.4 8.2 7.7 8.6  NEUTROABS 6.3  --   --  5.2 6.4 5.7 6.6  HGB 9.4*   < > 11.3* 11.1* 11.5* 11.2* 10.9*  HCT 30.3*   < > 34.4* 33.9*  34.8* 33.7* 34.0*  MCV 94.1   < > 89.8 90.4 89.9 89.2 91.2  PLT 338   < > 404* 422* 445* 418* 435*   < > = values in this interval not displayed.   Basic Metabolic Panel: Recent Labs  Lab 09/01/20 0928 09/02/20 0557 09/03/20 0523 09/05/20 0514  NA 137 135 139 135  K 3.4* 4.0 4.2 4.0  CL 106 101 103 98  CO2 22 25 25 26   GLUCOSE 94 121* 102* 126*  BUN 8 <5* 5* 11  CREATININE 0.43* 0.47 0.49 0.62  CALCIUM 7.4* 8.6* 8.6* 8.9  MG  1.8  --   --   --   PHOS 2.6  --   --  3.5   GFR: Estimated Creatinine Clearance: 44.2 mL/min (by C-G formula based on SCr of 0.62 mg/dL). Liver Function Tests: Recent Labs  Lab 09/01/20 0928 09/03/20 0523 09/05/20 0514  AST 14* 15  --   ALT 12 14  --   ALKPHOS 62 70  --   BILITOT 0.6 0.5  --   PROT 5.6* 6.4*  --   ALBUMIN 2.5* 2.9* 3.1*   No results for input(s): LIPASE, AMYLASE in the last 168 hours. No results for input(s): AMMONIA in the last 168 hours. Coagulation Profile: No results for input(s): INR, PROTIME in the last 168 hours. Cardiac Enzymes: No results for input(s): CKTOTAL, CKMB, CKMBINDEX, TROPONINI in the last 168 hours. BNP (last 3 results) No results for input(s): PROBNP in the last 8760 hours. HbA1C: No results for input(s): HGBA1C in the last 72 hours. CBG: No results for input(s): GLUCAP in the last 168 hours. Lipid Profile: No results for input(s): CHOL, HDL, LDLCALC, TRIG, CHOLHDL, LDLDIRECT in the last 72 hours. Thyroid Function Tests: No results for input(s): TSH, T4TOTAL, FREET4, T3FREE, THYROIDAB in the last 72 hours. Anemia Panel: No results for input(s): VITAMINB12, FOLATE, FERRITIN, TIBC, IRON, RETICCTPCT in the last 72 hours. Sepsis Labs: No results for input(s): PROCALCITON, LATICACIDVEN in the last 168 hours.  Recent Results (from the past 240 hour(s))  SARS CORONAVIRUS 2 (TAT 6-24 HRS) Nasopharyngeal Nasopharyngeal Swab     Status: None   Collection Time: 09/02/20  8:12 PM   Specimen: Nasopharyngeal  Swab  Result Value Ref Range Status   SARS Coronavirus 2 NEGATIVE NEGATIVE Final    Comment: (NOTE) SARS-CoV-2 target nucleic acids are NOT DETECTED.  The SARS-CoV-2 RNA is generally detectable in upper and lower respiratory specimens during the acute phase of infection. Negative results do not preclude SARS-CoV-2 infection, do not rule out co-infections with other pathogens, and should not be used as the sole basis for treatment or other patient management decisions. Negative results must be combined with clinical observations, patient history, and epidemiological information. The expected result is Negative.  Fact Sheet for Patients: SugarRoll.be  Fact Sheet for Healthcare Providers: https://www.woods-mathews.com/  This test is not yet approved or cleared by the [place] FDA and  has been authorized for detection and/or diagnosis of SARS-CoV-2 by FDA under an Emergency Use Authorization (EUA). This EUA will remain  in effect (meaning this test can be used) for the duration of the COVID-19 declaration under Se ction 564(b)(1) of the Act, 21 U.S.C. section 360bbb-3(b)(1), unless the authorization is terminated or revoked sooner.  Performed at [facility], [place] [address], [place], [place] [address]   SARS Coronavirus 2 by RT PCR (hospital order, performed in [facility] hospital lab) Nasopharyngeal Nasopharyngeal Swab     Status: None   Collection Time: 09/03/20 10:00 AM   Specimen: Nasopharyngeal Swab  Result Value Ref Range Status   SARS Coronavirus 2 NEGATIVE NEGATIVE Final    Comment: (NOTE) SARS-CoV-2 target nucleic acids are NOT DETECTED.  The SARS-CoV-2 RNA is generally detectable in upper and lower respiratory specimens during the acute phase of infection. The lowest concentration of SARS-CoV-2 viral copies this assay can detect is 250 copies / mL. A negative result does not preclude SARS-CoV-2 infection and should  not be used as the sole basis for treatment or other patient management decisions.  A negative result may  occur with improper specimen collection / handling, submission of specimen other than nasopharyngeal swab, presence of viral mutation(s) within the areas targeted by this assay, and inadequate number of viral copies (<250 copies / mL). A negative result must be combined with clinical observations, patient history, and epidemiological information.  Fact Sheet for Patients:   StrictlyIdeas.no  Fact Sheet for Healthcare Providers: BankingDealers.co.za  This test is not yet approved or  cleared by the [place] FDA and has been authorized for detection and/or diagnosis of SARS-CoV-2 by FDA under an Emergency Use Authorization (EUA).  This EUA will remain in effect (meaning this test can be used) for the duration of the COVID-19 declaration under Section 564(b)(1) of the Act, 21 U.S.C. section 360bbb-3(b)(1), unless the authorization is terminated or revoked sooner.  Performed at [facility], [address] [place], [place] [address]   SARS Coronavirus 2 by RT PCR (hospital order, performed in [facility] hospital lab) Nasopharyngeal Nasopharyngeal Swab     Status: None   Collection Time: 09/05/20  3:35 PM   Specimen: Nasopharyngeal Swab  Result Value Ref Range Status   SARS Coronavirus 2 NEGATIVE NEGATIVE Final    Comment: (NOTE) SARS-CoV-2 target nucleic acids are NOT DETECTED.  The SARS-CoV-2 RNA is generally detectable in upper and lower respiratory specimens during the acute phase of infection. The lowest concentration of SARS-CoV-2 viral copies this assay can detect is 250 copies / mL. A negative result does not preclude SARS-CoV-2 infection and should not be used as the sole basis for treatment or other patient management decisions.  A negative result may occur with improper specimen collection / handling,  submission of specimen other than nasopharyngeal swab, presence of viral mutation(s) within the areas targeted by this assay, and inadequate number of viral copies (<250 copies / mL). A negative result must be combined with clinical observations, patient history, and epidemiological information.  Fact Sheet for Patients:   StrictlyIdeas.no  Fact Sheet for Healthcare Providers: BankingDealers.co.za  This test is not yet approved or  cleared by the [place] FDA and has been authorized for detection and/or diagnosis of SARS-CoV-2 by FDA under an Emergency Use Authorization (EUA).  This EUA will remain in effect (meaning this test can be used) for the duration of the COVID-19 declaration under Section 564(b)(1) of the Act, 21 U.S.C. section 360bbb-3(b)(1), unless the authorization is terminated or revoked sooner.  Performed at [facility], [address], [place], [place] [address]       Imaging Studies   No results found.   Medications   Scheduled Meds: . bisacodyl  5 mg Oral Daily  . donepezil  10 mg Oral QHS  . enoxaparin (LOVENOX) injection  40 mg Subcutaneous Q24H  . feeding supplement  1 Container Oral TID BM  . magnesium oxide  200 mg Oral Daily  . senna-docusate  1 tablet Oral BID   Continuous Infusions:     LOS: 5 days    Time spent: 30 minutes with greater than 50% spent at bedside and in coordination of care    Ezekiel Slocumb, DO [facility]  09/07/2020, 4:05 PM    If 7PM-7AM, please contact night-coverage. How to contact the [facility] Attending or Consulting provider [place] or covering provider during after hours [place], for this patient?    1. Check the care team in [facility] and look for a) attending/consulting TRH provider listed and b) the [facility] team listed 2. Log into [email or web address] and  use [place]'s universal password to access. If you do not have the password, please contact the  hospital operator. 3. Locate the [facility] provider you are looking for under [facility] and page to a number that you can be directly reached. 4. If you still have difficulty reaching the provider, please page the [facility] (Director on Call) for the Hospitalists listed on amion for assistance.

## 2020-09-07 NOTE — TOC Progression Note (Signed)
Transition of Care [facility]) - Progression Note    Patient Details  Name: Cynthia Walker MRN: [id number] Date of Birth: [birth year]  Transition of Care [facility]) CM/SW Contact  Shelbie Hutching, RN Phone Number: 09/07/2020, 3:37 PM  Clinical Narrative:    [facility] F[facility] has denied insurance authorization for skilled nursing.  Hospitalist can call and do a peer to peer review with [facility] MD Dr. Rosalita Chessman by calling [phone] option 3.  MD has until 1200 tomorrow to complete peer to peer.    Expected Discharge Plan: Skilled Nursing Facility Barriers to Discharge: Insurance Authorization  Expected Discharge Plan and Services Expected Discharge Plan: [place]   Discharge Planning Services: CM Consult Post Acute Care Choice: [place] arrangements for the past 2 months: Single Family Home Expected Discharge Date: 09/06/20                 DME Agency: NA       HH Arranged: NA           Social Determinants of Health (SDOH) Interventions    Readmission Risk Interventions No flowsheet data found.

## 2020-09-07 NOTE — Progress Notes (Signed)
Occupational Therapy Treatment Patient Details Name: Cynthia Walker MRN: [id number] DOB: 04/05/1935 Today's Date: 09/07/2020    History of present illness Pt is an 85 y/o F With PMH: R BRCA s/p lumpectomy, XRT and Chemo 2009 as well as HTN and moderate dementia. Had IM nailing after fall to L hip 06/2019. Pt presented to ED d/t abdominal pain, weakness, and constipation x2 weeks. Low probablility of infection as pt just finished Rx for UTI. Concerns for cancer mets to R fifth rib per MD note.   OT comments  [name] was seen for OT treatment on this date. Upon arrival to room pt asleep c husband present at bedside. Pt awakes and appears drowsy t/o session; however is responsive to yes/no questions, states "your hands are cold," and and follows ~25% of commands. MOD A self-feeding at bed level - pt has strength to bring to mouth w/o assist but requires MOD A/tactile cues to self-feed pudding c built up handle. MIN A self-drinking - assist to tilt cup to prevent spillage. MAX A don B socks at bed level. Pt making good progress toward goals. Pt continues to benefit from skilled OT services to maximize return to PLOF and minimize risk of future falls, injury, caregiver burden, and readmission. Will continue to follow POC. Discharge recommendation remains appropriate.    Follow Up Recommendations  SNF    Equipment Recommendations  Hospital bed    Recommendations for Other Services      Precautions / Restrictions Precautions Precautions: Fall Restrictions Weight Bearing Restrictions: No       Mobility Bed Mobility    General bed mobility comments: Deferred - pt recently returned to bed and fatigued  Transfers    General transfer comment: NT        ADL either performed or assessed with clinical judgement   ADL Overall ADL's : Needs assistance/impaired      General ADL Comments: MOD A self-feeding at bed level - pt has strength to bring to mouth w/o assist but requires MOD  A/tactile cues to self-feed c built up handle. MIN A self-drinking - assist to tilt cup to prevent spillage. MIN A wipe mouth at bed level. MAX A don B socks at bed level                Cognition Arousal/Alertness: Lethargic;Awake/alert Behavior During Therapy: Flat affect Overall Cognitive Status: History of cognitive impairments - at baseline      General Comments: drowsy, responds to stimuli and follows <50% commands        Exercises Exercises: Other exercises Other Exercises Other Exercises: Pt and family instructed re: DME recs, d/c recs, falls prevention, ECS, AE recs Other Exercises: Self-feeding, face washing, LBD           Pertinent Vitals/ Pain       Pain Assessment: Faces Faces Pain Scale: Hurts little more Pain Location: LUE c movement  Pain Descriptors / Indicators: Grimacing;Guarding Pain Intervention(s): Limited activity within patient's tolerance;Repositioned         Frequency  Min 1X/week        Progress Toward Goals  OT Goals(current goals can now be found in the care plan section)  Progress towards OT goals: Progressing toward goals  Acute Rehab OT Goals Patient Stated Goal: to get her stronger so she's better able to help me move her OT Goal Formulation: With family Time For Goal Achievement: 09/18/20 Potential to Achieve Goals: Fair ADL Goals Pt Will Perform Eating: with min assist;sitting (  supported sitting) Pt Will Perform Grooming: with min assist;sitting (supported sitting) Pt/caregiver will Perform Home Exercise Program: Increased ROM;Increased strength;Both right and left upper extremity;With minimal assist Additional ADL Goal #1: Pt will tolerate ~5-6 mins EOB sitting with assist to increase fxl sitting tolerance.  Plan Discharge plan remains appropriate;Frequency remains appropriate       AM-PAC OT "6 Clicks" Daily Activity     Outcome Measure   Help from another person eating meals?: A Lot Help from another person taking  care of personal grooming?: A Lot Help from another person toileting, which includes using toliet, bedpan, or urinal?: Total Help from another person bathing (including washing, rinsing, drying)?: Total Help from another person to put on and taking off regular upper body clothing?: A Lot Help from another person to put on and taking off regular lower body clothing?: A Lot 6 Click Score: 10    End of Session    OT Visit Diagnosis: Unsteadiness on feet (R26.81);Muscle weakness (generalized) (M62.81);History of falling (Z91.81)   Activity Tolerance Patient limited by fatigue   Patient Left in bed;with call bell/phone within reach;with family/visitor present   Nurse Communication          Time: 1356-1414 OT Time Calculation (min): 18 min  Charges: OT General Charges $OT Visit: 1 Visit OT Treatments $Self Care/Home Management : 8-22 mins   , M.S. OTR/L  09/07/20, 2:29 PM  ascom [phone]    

## 2020-09-08 DIAGNOSIS — R1084 Generalized abdominal pain: Secondary | ICD-10-CM | POA: Diagnosis not present

## 2020-09-08 LAB — BASIC METABOLIC PANEL
Anion gap: 11 (ref 5–15)
BUN: 14 mg/dL (ref 8–23)
CO2: 25 mmol/L (ref 22–32)
Calcium: 8.4 mg/dL — ABNORMAL LOW (ref 8.9–10.3)
Chloride: 98 mmol/L (ref 98–111)
Creatinine, Ser: 0.55 mg/dL (ref 0.44–1.00)
GFR, Estimated: >60 mL/min (ref >60)
Glucose, Bld: 96 mg/dL (ref 70–99)
Potassium: 3.9 mmol/L (ref 3.5–5.1)
Sodium: 134 mmol/L — ABNORMAL LOW (ref 135–145)

## 2020-09-08 NOTE — TOC Progression Note (Signed)
Transition of Care [facility]) - Progression Note    Patient Details  Name: SRUTI AYLLON MRN: [id number] Date of Birth: Apr 18, 1935  [facility] CM/SW Contact  Shelbie Hutching, RN Phone Number: 09/08/2020, 2:33 PM  Clinical Narrative:    Husband reports that he is going to call [employer] and appeal insurance authorization denial.    Expected Discharge Plan: Skilled Nursing Facility Barriers to Discharge: Insurance Authorization  Expected Discharge Plan and Services Expected Discharge Plan: [place]   Discharge Planning Services: CM Consult Post Acute Care Choice: [place] Living arrangements for the past 2 months: Single Family Home Expected Discharge Date: 09/06/20                 DME Agency: NA       HH Arranged: NA           Social Determinants of Health (SDOH) Interventions    Readmission Risk Interventions No flowsheet data found.

## 2020-09-08 NOTE — Care Management Important Message (Signed)
Important Message  Patient Details  Name: Cynthia Walker MRN: [id number] Date of Birth: 1935-09-03   Medicare Important Message Given:  Yes     [name] 09/08/2020, 10:42 AM

## 2020-09-08 NOTE — Progress Notes (Signed)
PROGRESS NOTE    Cynthia Walker   AOZ:[id number]  DOB: 12/16/1934  PCP: Sofie Hartigan, MD    DOA: 09/01/2020 LOS: 71   Brief Narrative   85 year old female with hx of right breast cancer with lumpectomy 2009 status post XRT, chemo HTN, moderate dementia came to ED with generalized weakness, abdominal pain and severe constipation X 2 weeks despite MiraLax at home.  Pt has become challenging for husband to care for at home since she has recently become unable to ambulate and requiring [place] lift for transfers.  Also with poor appetite.    Evaluation in the ED including CT abdomen/pelvis showed moderate stool volume in colon, no other acute findings.  There was however opacity in the RML with lateral right 5th rib erosion, concerning for malignancy.  Patient admitted and treated with aggressive bowel regimen, with resolution of constipation.  Remains on routine stool softeners.  PO intake has improved somewhat, but remains poor.  Palliative care was consulted and current goal of care is to attempt trial of rehab to see if patient's mobility able to improve, as husband would like to be able to continue caring for her at home.        Assessment & Plan   Principal Problem:   Abdominal pain Active Problems:   Essential hypertension   Normocytic anemia   Dementia (HCC)   Moderate protein malnutrition (HCC)   Hypokalemia   Constipation   Metabolic encephalopathy  Patient is medically stable for discharge to SNF pending insurance authorization.   Abdominal pain secondary to constipation -present on admission, resolved after aggressive bowel regimen. 12/2: Per husband, stool remains "dry and hard".  RN did report small BM earlier today. --Continue senna twice daily, Dulcolax daily --Will use suppository or enema as needed if no BM in 24 to 48 hours   Recent UTI -08/14/2020, treated with Cipro x6 days.  UA on admission was negative.  Hypokalemia -present on admission,  resolved after K was replaced.  Monitor BMP and replace as needed.  Breast cancer status post lumpectomy, XRT -concerning findings on CT scanned this admission involving right long and lateral fifth rib which appears eroded and concerning for malignancy.  Due to patient's advancing dementia and poor overall prognosis, no further evaluation at this time.  Patient would be a poor candidate for any intervention including surgery or chemo/radiation.  Hypertension -not on antihypertensives outpatient.  Monitor BP.  Moderate to severe dementia -appears recently progressing.  Has been wheelchair bound and dependent on [place] for transfers.  PT and OT recommended SNF and after palliative consultation, husband hopeful that patient would improve functionally with trial at rehab.  Insurance has unfortunately declined to cover SNF for rehab.  Will need to explore other options for adequate help in the home versus long-term placement.    Patient BMI: Body mass index is 19.86 kg/m.   DVT prophylaxis: enoxaparin (LOVENOX) injection 40 mg Start: 09/01/20 2000   Diet:  Diet Orders (From admission, onward)    Start     Ordered   09/05/20 0000  Diet - low sodium heart healthy        09/05/20 1528   09/02/20 0831  Diet regular Room service appropriate? Yes; Fluid consistency: Thin  Diet effective now       Question Answer Comment  Room service appropriate? Yes   Fluid consistency: Thin      09/02/20 0830            Code  Status: DNR    Subjective 09/08/20    Patient seen awake today with husband at bedside.  This is my first time seeing her awake.  She denies any pain or discomfort.  Says she feels fine.  Has been eating fairly well with husband's assistance.  Disposition Plan & Communication   Status is: Inpatient  Remains inpatient appropriate because: Requires long-term placement or arrangement for adequate home health services and caregiver support.   Dispo: The patient is from:  Home              Anticipated d/c is to: Home with home health versus long-term care              Anticipated d/c date is: 1 day              Patient currently is medically stable to d/c.   Family Communication: Husband is at bedside on rounds today   Consults, Procedures, Significant Events   Consultants:   None  Procedures:   None  Antimicrobials:  Anti-infectives (From admission, onward)   None         Objective   Vitals:   09/08/20 0410 09/08/20 0700 09/08/20 1226 09/08/20 1554  BP: 121/69 (!) 161/72 116/65 (!) 160/98  Pulse: 87 92 88 97  Resp: 17 18 20 16   Temp: 97.7 F (36.5 C) 98.9 F (37.2 C) 98.1 F (36.7 C) 98.7 F (37.1 C)  TempSrc:   Oral   SpO2: 97% 93% 96% 98%  Weight:      Height:        Intake/Output Summary (Last 24 hours) at 09/08/2020 1859 Last data filed at 09/08/2020 0412 Gross per 24 hour  Intake --  Output 200 ml  Net -200 ml   Filed Weights   09/03/20 0650 09/04/20 0100 09/05/20 0431  Weight: 54.6 kg 55.8 kg 52.5 kg    Physical Exam:  General exam: awake, alert no acute distress Respiratory system: CTAB, on room air, normal respir effort Cardiovascular system: normal S1/S2, RRR, no pedal edema.   Gastrointestinal system: soft, NT, ND, hypoactive bowel sounds  Labs   Data Reviewed: I have personally reviewed following labs and imaging studies  CBC: Recent Labs  Lab 09/02/20 0557 09/03/20 0523 09/05/20 0514 09/06/20 0634 09/07/20 0443  WBC 8.3 7.4 8.2 7.7 8.6  NEUTROABS  --  5.2 6.4 5.7 6.6  HGB 11.3* 11.1* 11.5* 11.2* 10.9*  HCT 34.4* 33.9* 34.8* 33.7* 34.0*  MCV 89.8 90.4 89.9 89.2 91.2  PLT 404* 422* 445* 418* 976*   Basic Metabolic Panel: Recent Labs  Lab 09/02/20 0557 09/03/20 0523 09/05/20 0514 09/08/20 0434  NA 135 139 135 134*  K 4.0 4.2 4.0 3.9  CL 101 103 98 98  CO2 25 25 26 25   GLUCOSE 121* 102* 126* 96  BUN <5* 5* 11 14  CREATININE 0.47 0.49 0.62 0.55  CALCIUM 8.6* 8.6* 8.9 8.4*  PHOS  --    --  3.5  --    GFR: Estimated Creatinine Clearance: 44.2 mL/min (by C-G formula based on SCr of 0.55 mg/dL). Liver Function Tests: Recent Labs  Lab 09/03/20 0523 09/05/20 0514  AST 15  --   ALT 14  --   ALKPHOS 70  --   BILITOT 0.5  --   PROT 6.4*  --   ALBUMIN 2.9* 3.1*   No results for input(s): LIPASE, AMYLASE in the last 168 hours. No results for input(s): AMMONIA in the last 168 hours.  Coagulation Profile: No results for input(s): INR, PROTIME in the last 168 hours. Cardiac Enzymes: No results for input(s): CKTOTAL, CKMB, CKMBINDEX, TROPONINI in the last 168 hours. BNP (last 3 results) No results for input(s): PROBNP in the last 8760 hours. HbA1C: No results for input(s): HGBA1C in the last 72 hours. CBG: No results for input(s): GLUCAP in the last 168 hours. Lipid Profile: No results for input(s): CHOL, HDL, LDLCALC, TRIG, CHOLHDL, LDLDIRECT in the last 72 hours. Thyroid Function Tests: No results for input(s): TSH, T4TOTAL, FREET4, T3FREE, THYROIDAB in the last 72 hours. Anemia Panel: No results for input(s): VITAMINB12, FOLATE, FERRITIN, TIBC, IRON, RETICCTPCT in the last 72 hours. Sepsis Labs: No results for input(s): PROCALCITON, LATICACIDVEN in the last 168 hours.  Recent Results (from the past 240 hour(s))  SARS CORONAVIRUS 2 (TAT 6-24 HRS) Nasopharyngeal Nasopharyngeal Swab     Status: None   Collection Time: 09/02/20  8:12 PM   Specimen: Nasopharyngeal Swab  Result Value Ref Range Status   SARS Coronavirus 2 NEGATIVE NEGATIVE Final    Comment: (NOTE) SARS-CoV-2 target nucleic acids are NOT DETECTED.  The SARS-CoV-2 RNA is generally detectable in upper and lower respiratory specimens during the acute phase of infection. Negative results do not preclude SARS-CoV-2 infection, do not rule out co-infections with other pathogens, and should not be used as the sole basis for treatment or other patient management decisions. Negative results must be combined  with clinical observations, patient history, and epidemiological information. The expected result is Negative.  Fact Sheet for Patients: SugarRoll.be  Fact Sheet for Healthcare Providers: https://www.woods-mathews.com/  This test is not yet approved or cleared by the [place] FDA and  has been authorized for detection and/or diagnosis of SARS-CoV-2 by FDA under an Emergency Use Authorization (EUA). This EUA will remain  in effect (meaning this test can be used) for the duration of the COVID-19 declaration under Se ction 564(b)(1) of the Act, 21 U.S.C. section 360bbb-3(b)(1), unless the authorization is terminated or revoked sooner.  Performed at [facility], [place] [address] [place], [place] [address]   SARS Coronavirus 2 by RT PCR (hospital order, performed in [facility] hospital lab) Nasopharyngeal Nasopharyngeal Swab     Status: None   Collection Time: 09/03/20 10:00 AM   Specimen: Nasopharyngeal Swab  Result Value Ref Range Status   SARS Coronavirus 2 NEGATIVE NEGATIVE Final    Comment: (NOTE) SARS-CoV-2 target nucleic acids are NOT DETECTED.  The SARS-CoV-2 RNA is generally detectable in upper and lower respiratory specimens during the acute phase of infection. The lowest concentration of SARS-CoV-2 viral copies this assay can detect is 250 copies / mL. A negative result does not preclude SARS-CoV-2 infection and should not be used as the sole basis for treatment or other patient management decisions.  A negative result may occur with improper specimen collection / handling, submission of specimen other than nasopharyngeal swab, presence of viral mutation(s) within the areas targeted by this assay, and inadequate number of viral copies (<250 copies / mL). A negative result must be combined with clinical observations, patient history, and epidemiological information.  Fact Sheet for Patients:    StrictlyIdeas.no  Fact Sheet for Healthcare Providers: BankingDealers.co.za  This test is not yet approved or  cleared by the [place] FDA and has been authorized for detection and/or diagnosis of SARS-CoV-2 by FDA under an Emergency Use Authorization (EUA).  This EUA will remain in effect (meaning this test can be used) for the duration  of the COVID-19 declaration under Section 564(b)(1) of the Act, 21 U.S.C. section 360bbb-3(b)(1), unless the authorization is terminated or revoked sooner.  Performed at [facility], [address] [place], [place] [address]   SARS Coronavirus 2 by RT PCR (hospital order, performed in [facility] hospital lab) Nasopharyngeal Nasopharyngeal Swab     Status: None   Collection Time: 09/05/20  3:35 PM   Specimen: Nasopharyngeal Swab  Result Value Ref Range Status   SARS Coronavirus 2 NEGATIVE NEGATIVE Final    Comment: (NOTE) SARS-CoV-2 target nucleic acids are NOT DETECTED.  The SARS-CoV-2 RNA is generally detectable in upper and lower respiratory specimens during the acute phase of infection. The lowest concentration of SARS-CoV-2 viral copies this assay can detect is 250 copies / mL. A negative result does not preclude SARS-CoV-2 infection and should not be used as the sole basis for treatment or other patient management decisions.  A negative result may occur with improper specimen collection / handling, submission of specimen other than nasopharyngeal swab, presence of viral mutation(s) within the areas targeted by this assay, and inadequate number of viral copies (<250 copies / mL). A negative result must be combined with clinical observations, patient history, and epidemiological information.  Fact Sheet for Patients:   StrictlyIdeas.no  Fact Sheet for Healthcare Providers: BankingDealers.co.za  This test is not yet approved or   cleared by the [place] FDA and has been authorized for detection and/or diagnosis of SARS-CoV-2 by FDA under an Emergency Use Authorization (EUA).  This EUA will remain in effect (meaning this test can be used) for the duration of the COVID-19 declaration under Section 564(b)(1) of the Act, 21 U.S.C. section 360bbb-3(b)(1), unless the authorization is terminated or revoked sooner.  Performed at [facility], [address], [place], [place] [address]       Imaging Studies   No results found.   Medications   Scheduled Meds: . bisacodyl  5 mg Oral Daily  . donepezil  10 mg Oral QHS  . enoxaparin (LOVENOX) injection  40 mg Subcutaneous Q24H  . feeding supplement  1 Container Oral TID BM  . magnesium oxide  200 mg Oral Daily  . senna-docusate  1 tablet Oral BID   Continuous Infusions:     LOS: 6 days    Time spent: [place], DO [facility]  09/08/2020, 6:59 PM    If 7PM-7AM, please contact night-coverage. How to contact the [facility] Attending or Consulting provider [place] or covering provider during after hours [place], for this patient?    1. Check the care team in [facility] and look for a) attending/consulting TRH provider listed and b) the [facility] team listed 2. Log into [email or web address] and use [place]'s universal password to access. If you do not have the password, please contact the hospital operator. 3. Locate the [facility] provider you are looking for under [facility] and page to a number that you can be directly reached. 4. If you still have difficulty reaching the provider, please page the [facility] (Director on Call) for the Hospitalists listed on amion for assistance.

## 2020-09-08 NOTE — TOC Progression Note (Signed)
Transition of Care [facility]) - Progression Note    Patient Details  Name: Cynthia Walker MRN: [id number] Date of Birth: December 12, 1934  [facility] CM/SW Contact  Shelbie Hutching, RN Phone Number: 09/08/2020, 10:28 AM  Clinical Narrative:    RNCM notified patient's husband who is at the bedside that [facility] has denied authorization for SNF.  MD completed Peer to Peer yesterday and [employer] still denies.  Husband is very distressed over this.  RNCM presented the option of going home with home health services and outpatient palliative.  Husband requests to have some time to think and meditate and asks that I return later.     Expected Discharge Plan: Skilled Nursing Facility Barriers to Discharge: Insurance Authorization  Expected Discharge Plan and Services Expected Discharge Plan: [place]   Discharge Planning Services: CM Consult Post Acute Care Choice: [place] arrangements for the past 2 months: Single Family Home Expected Discharge Date: 09/06/20                 DME Agency: NA       HH Arranged: NA           Social Determinants of Health (SDOH) Interventions    Readmission Risk Interventions No flowsheet data found.

## 2020-09-09 DIAGNOSIS — R1084 Generalized abdominal pain: Secondary | ICD-10-CM | POA: Diagnosis not present

## 2020-09-09 DIAGNOSIS — F0390 Unspecified dementia without behavioral disturbance: Secondary | ICD-10-CM | POA: Diagnosis not present

## 2020-09-09 MED ORDER — MIRTAZAPINE 15 MG PO TABS
7.5000 mg | ORAL_TABLET | Freq: Every day | ORAL | Status: DC
  Administered 2020-09-09 – 2020-09-11 (×2): 7.5 mg via ORAL
  Filled 2020-09-09 (×3): qty 1

## 2020-09-09 MED ORDER — MIRTAZAPINE 15 MG PO TABS: 15.0000 mg | ORAL_TABLET | Freq: Every day | ORAL | Status: DC

## 2020-09-09 NOTE — Progress Notes (Signed)
PROGRESS NOTE    Cynthia Walker   NWG:[id number]  DOB: Mar 02, 1935  PCP: Sofie Hartigan, MD    DOA: 09/01/2020 LOS: 7   Brief Narrative   85 year old female with hx of right breast cancer with lumpectomy 2009 status post XRT, chemo HTN, moderate dementia came to ED with generalized weakness, abdominal pain and severe constipation X 2 weeks despite MiraLax at home.  Pt has become challenging for husband to care for at home since she has recently become unable to ambulate and requiring [facility] lift for transfers.  Also with poor appetite.    Evaluation in the ED including CT abdomen/pelvis showed moderate stool volume in colon, no other acute findings.  There was however opacity in the RML with lateral right 5th rib erosion, concerning for malignancy.  Patient admitted and treated with aggressive bowel regimen, with resolution of constipation.  Remains on routine stool softeners.  PO intake has improved somewhat, but remains poor.  Palliative care was consulted and current goal of care is to attempt trial of rehab to see if patient's mobility able to improve, as husband would like to be able to continue caring for her at home.        Assessment & Plan   Principal Problem:   Abdominal pain Active Problems:   Essential hypertension   Normocytic anemia   Dementia (HCC)   Moderate protein malnutrition (HCC)   Hypokalemia   Constipation   Metabolic encephalopathy  Patient is medically stable for discharge to SNF pending insurance authorization.   Abdominal pain secondary to constipation -present on admission, resolved after aggressive bowel regimen. 12/2: Per husband, stool remains "dry and hard".  RN did report small BM earlier today. --Continue senna twice daily, Dulcolax daily --Will use suppository or enema as needed if no BM in 24 to 48 hours   Recent UTI -08/14/2020, treated with Cipro x6 days.  UA on admission was negative.  Hypokalemia -present on admission,  resolved after K was replaced.  Monitor BMP and replace as needed.  Breast cancer status post lumpectomy, XRT -concerning findings on CT scanned this admission involving right long and lateral fifth rib which appears eroded and concerning for malignancy.  Due to patient's advancing dementia and poor overall prognosis, no further evaluation at this time.  Patient would be a poor candidate for any intervention including surgery or chemo/radiation.  Hypertension -not on antihypertensives outpatient.  Monitor BP.  Moderate to severe dementia -appears recently progressing.  Has been wheelchair bound and dependent on [facility] for transfers.  PT and OT recommended SNF and after palliative consultation, husband hopeful that patient would improve functionally with trial at rehab.  Insurance has unfortunately declined to cover SNF for rehab.  Will need to explore other options for adequate help in the home versus long-term placement.    Patient BMI: Body mass index is 19.86 kg/m.   DVT prophylaxis: enoxaparin (LOVENOX) injection 40 mg Start: 09/01/20 2000   Diet:  Diet Orders (From admission, onward)    Start     Ordered   09/05/20 0000  Diet - low sodium heart healthy        09/05/20 1528   09/02/20 0831  Diet regular Room service appropriate? Yes; Fluid consistency: Thin  Diet effective now       Question Answer Comment  Room service appropriate? Yes   Fluid consistency: Thin      09/02/20 0830            Code  Status: DNR    Subjective 09/09/20    Patient seen with husband at bedside today.  She is awake sitting up in bed.  Husband reports is not eating any here for lunch but did eat breakfast.  He is waiting to hear back from [facility] as he appealed their denial for rehab.  Expects to know by Wednesday.  Disposition Plan & Communication   Status is: Inpatient  Remains inpatient appropriate because: Requires long-term placement or arrangement for adequate home health services and  caregiver support.  Patient's husband has appealed the denial for SNF and awaiting response from insurance.   Dispo: The patient is from: Home              Anticipated d/c is to: Home with home health versus long-term care              Anticipated d/c date is: 1 day              Patient currently is medically stable to d/c.   Family Communication: Husband is at bedside on rounds today   Consults, Procedures, Significant Events   Consultants:   None  Procedures:   None  Antimicrobials:  Anti-infectives (From admission, onward)   None         Objective   Vitals:   09/08/20 2002 09/09/20 0015 09/09/20 0509 09/09/20 0532  BP: 139/79 132/79 (!) 130/56   Pulse: 95 96 90   Resp: 16 18 16    Temp: 100.3 F (37.9 C) 98.9 F (37.2 C)  98.9 F (37.2 C)  TempSrc:      SpO2: 93% 95% 99%   Weight:      Height:        Intake/Output Summary (Last 24 hours) at 09/09/2020 1535 Last data filed at 09/09/2020 1037 Gross per 24 hour  Intake 120 ml  Output --  Net 120 ml   Filed Weights   09/03/20 0650 09/04/20 0100 09/05/20 0431  Weight: 54.6 kg 55.8 kg 52.5 kg    Physical Exam:  General exam: awake, alert no acute distress Respiratory system: CTAB, on room air, normal respiratory effort Cardiovascular system: normal S1/S2, RRR, no pedal edema.   Gastrointestinal system: soft, NT, ND, +bowel sounds  Labs   Data Reviewed: I have personally reviewed following labs and imaging studies  CBC: Recent Labs  Lab 09/03/20 0523 09/05/20 0514 09/06/20 0634 09/07/20 0443  WBC 7.4 8.2 7.7 8.6  NEUTROABS 5.2 6.4 5.7 6.6  HGB 11.1* 11.5* 11.2* 10.9*  HCT 33.9* 34.8* 33.7* 34.0*  MCV 90.4 89.9 89.2 91.2  PLT 422* 445* 418* 841*   Basic Metabolic Panel: Recent Labs  Lab 09/03/20 0523 09/05/20 0514 09/08/20 0434  NA 139 135 134*  K 4.2 4.0 3.9  CL 103 98 98  CO2 25 26 25   GLUCOSE 102* 126* 96  BUN 5* 11 14  CREATININE 0.49 0.62 0.55  CALCIUM 8.6* 8.9 8.4*  PHOS   --  3.5  --    GFR: Estimated Creatinine Clearance: 44.2 mL/min (by C-G formula based on SCr of 0.55 mg/dL). Liver Function Tests: Recent Labs  Lab 09/03/20 0523 09/05/20 0514  AST 15  --   ALT 14  --   ALKPHOS 70  --   BILITOT 0.5  --   PROT 6.4*  --   ALBUMIN 2.9* 3.1*   No results for input(s): LIPASE, AMYLASE in the last 168 hours. No results for input(s): AMMONIA in the last 168 hours. Coagulation  Profile: No results for input(s): INR, PROTIME in the last 168 hours. Cardiac Enzymes: No results for input(s): CKTOTAL, CKMB, CKMBINDEX, TROPONINI in the last 168 hours. BNP (last 3 results) No results for input(s): PROBNP in the last 8760 hours. HbA1C: No results for input(s): HGBA1C in the last 72 hours. CBG: No results for input(s): GLUCAP in the last 168 hours. Lipid Profile: No results for input(s): CHOL, HDL, LDLCALC, TRIG, CHOLHDL, LDLDIRECT in the last 72 hours. Thyroid Function Tests: No results for input(s): TSH, T4TOTAL, FREET4, T3FREE, THYROIDAB in the last 72 hours. Anemia Panel: No results for input(s): VITAMINB12, FOLATE, FERRITIN, TIBC, IRON, RETICCTPCT in the last 72 hours. Sepsis Labs: No results for input(s): PROCALCITON, LATICACIDVEN in the last 168 hours.  Recent Results (from the past 240 hour(s))  SARS CORONAVIRUS 2 (TAT 6-24 HRS) Nasopharyngeal Nasopharyngeal Swab     Status: None   Collection Time: 09/02/20  8:12 PM   Specimen: Nasopharyngeal Swab  Result Value Ref Range Status   SARS Coronavirus 2 NEGATIVE NEGATIVE Final    Comment: (NOTE) SARS-CoV-2 target nucleic acids are NOT DETECTED.  The SARS-CoV-2 RNA is generally detectable in upper and lower respiratory specimens during the acute phase of infection. Negative results do not preclude SARS-CoV-2 infection, do not rule out co-infections with other pathogens, and should not be used as the sole basis for treatment or other patient management decisions. Negative results must be combined  with clinical observations, patient history, and epidemiological information. The expected result is Negative.  Fact Sheet for Patients: SugarRoll.be  Fact Sheet for Healthcare Providers: https://www.woods-mathews.com/  This test is not yet approved or cleared by the [place] FDA and  has been authorized for detection and/or diagnosis of SARS-CoV-2 by FDA under an Emergency Use Authorization (EUA). This EUA will remain  in effect (meaning this test can be used) for the duration of the COVID-19 declaration under Se ction 564(b)(1) of the Act, 21 U.S.C. section 360bbb-3(b)(1), unless the authorization is terminated or revoked sooner.  Performed at [facility], [place] [address] [place], [place] [address]   SARS Coronavirus 2 by RT PCR (hospital order, performed in [facility] hospital lab) Nasopharyngeal Nasopharyngeal Swab     Status: None   Collection Time: 09/03/20 10:00 AM   Specimen: Nasopharyngeal Swab  Result Value Ref Range Status   SARS Coronavirus 2 NEGATIVE NEGATIVE Final    Comment: (NOTE) SARS-CoV-2 target nucleic acids are NOT DETECTED.  The SARS-CoV-2 RNA is generally detectable in upper and lower respiratory specimens during the acute phase of infection. The lowest concentration of SARS-CoV-2 viral copies this assay can detect is 250 copies / mL. A negative result does not preclude SARS-CoV-2 infection and should not be used as the sole basis for treatment or other patient management decisions.  A negative result may occur with improper specimen collection / handling, submission of specimen other than nasopharyngeal swab, presence of viral mutation(s) within the areas targeted by this assay, and inadequate number of viral copies (<250 copies / mL). A negative result must be combined with clinical observations, patient history, and epidemiological information.  Fact Sheet for Patients:     StrictlyIdeas.no  Fact Sheet for Healthcare Providers: BankingDealers.co.za  This test is not yet approved or  cleared by the [place] FDA and has been authorized for detection and/or diagnosis of SARS-CoV-2 by FDA under an Emergency Use Authorization (EUA).  This EUA will remain in effect (meaning this test can be used) for the duration  of the COVID-19 declaration under Section 564(b)(1) of the Act, 21 U.S.C. section 360bbb-3(b)(1), unless the authorization is terminated or revoked sooner.  Performed at [facility], [address] [place], [place] [address]   SARS Coronavirus 2 by RT PCR (hospital order, performed in [facility] hospital lab) Nasopharyngeal Nasopharyngeal Swab     Status: None   Collection Time: 09/05/20  3:35 PM   Specimen: Nasopharyngeal Swab  Result Value Ref Range Status   SARS Coronavirus 2 NEGATIVE NEGATIVE Final    Comment: (NOTE) SARS-CoV-2 target nucleic acids are NOT DETECTED.  The SARS-CoV-2 RNA is generally detectable in upper and lower respiratory specimens during the acute phase of infection. The lowest concentration of SARS-CoV-2 viral copies this assay can detect is 250 copies / mL. A negative result does not preclude SARS-CoV-2 infection and should not be used as the sole basis for treatment or other patient management decisions.  A negative result may occur with improper specimen collection / handling, submission of specimen other than nasopharyngeal swab, presence of viral mutation(s) within the areas targeted by this assay, and inadequate number of viral copies (<250 copies / mL). A negative result must be combined with clinical observations, patient history, and epidemiological information.  Fact Sheet for Patients:   StrictlyIdeas.no  Fact Sheet for Healthcare Providers: BankingDealers.co.za  This test is not yet approved or   cleared by the [place] FDA and has been authorized for detection and/or diagnosis of SARS-CoV-2 by FDA under an Emergency Use Authorization (EUA).  This EUA will remain in effect (meaning this test can be used) for the duration of the COVID-19 declaration under Section 564(b)(1) of the Act, 21 U.S.C. section 360bbb-3(b)(1), unless the authorization is terminated or revoked sooner.  Performed at [facility], [address] [place],  [address]       Imaging Studies   No results found.   Medications   Scheduled Meds: . bisacodyl  5 mg Oral Daily  . donepezil  10 mg Oral QHS  . enoxaparin (LOVENOX) injection  40 mg Subcutaneous Q24H  . feeding supplement  1 Container Oral TID BM  . magnesium oxide  200 mg Oral Daily  . senna-docusate  1 tablet Oral BID   Continuous Infusions:     LOS: 7 days    Time spent: [place], DO [facility]  09/09/2020, 3:35 PM    If 7PM-7AM, please contact night-coverage. How to contact the [facility] Attending or Consulting provider [place] or covering provider during after hours [place], for this patient?    1. Check the care team in [facility] and look for a) attending/consulting [facility] provider listed and b) the [facility] team listed 2. Log into [email or web address] and use 's universal password to access. If you do not have the password, please contact the hospital operator. 3. Locate the [facility] provider you are looking for under [facility] and page to a number that you can be directly reached. 4. If you still have difficulty reaching the provider, please page the [facility] (Director on Call) for the Hospitalists listed on amion for assistance.

## 2020-09-10 DIAGNOSIS — F0390 Unspecified dementia without behavioral disturbance: Secondary | ICD-10-CM | POA: Diagnosis not present

## 2020-09-10 DIAGNOSIS — R1084 Generalized abdominal pain: Secondary | ICD-10-CM | POA: Diagnosis not present

## 2020-09-10 NOTE — Progress Notes (Signed)
PROGRESS NOTE    Cynthia Walker   YBO:[id number]  DOB: 1935-04-04  PCP: Sofie Hartigan, MD    DOA: 09/01/2020 LOS: 10   Brief Narrative   85 year old female with hx of right breast cancer with lumpectomy 2009 status post XRT, chemo HTN, moderate dementia came to ED with generalized weakness, abdominal pain and severe constipation X 2 weeks despite MiraLax at home.  Pt has become challenging for husband to care for at home since she has recently become unable to ambulate and requiring [facility] lift for transfers.  Also with poor appetite.    Evaluation in the ED including CT abdomen/pelvis showed moderate stool volume in colon, no other acute findings.  There was however opacity in the RML with lateral right 5th rib erosion, concerning for malignancy.  Patient admitted and treated with aggressive bowel regimen, with resolution of constipation.  Remains on routine stool softeners.  PO intake has improved somewhat, but remains poor.  Palliative care was consulted and current goal of care is to attempt trial of rehab to see if patient's mobility able to improve, as husband would like to be able to continue caring for her at home.        Assessment & Plan   Principal Problem:   Abdominal pain Active Problems:   Essential hypertension   Normocytic anemia   Dementia (HCC)   Moderate protein malnutrition (HCC)   Hypokalemia   Constipation   Metabolic encephalopathy  Patient is medically stable for discharge to SNF pending insurance authorization.   Abdominal pain secondary to constipation -present on admission, resolved after aggressive bowel regimen. 12/2: Per husband, stool remains "dry and hard".  RN did report small BM earlier today. --Continue senna twice daily, Dulcolax daily --Will use suppository or enema as needed if no BM in 24 to 48 hours  Anorexia -suspect poor appetite is due to progressive dementia, it does seem to wax and wane.   Will try low-dose Remeron  and monitor.  Recent UTI -08/14/2020, treated with Cipro x6 days.  UA on admission was negative.  Hypokalemia -present on admission, resolved after K was replaced.  Monitor BMP and replace as needed.  Breast cancer status post lumpectomy, XRT -concerning findings on CT scanned this admission involving right long and lateral fifth rib which appears eroded and concerning for malignancy.  Due to patient's advancing dementia and poor overall prognosis, no further evaluation at this time.  Patient would be a poor candidate for any intervention including surgery or chemo/radiation.  Hypertension -not on antihypertensives outpatient.  Monitor BP.  Moderate to severe dementia -appears recently progressing.  Has been wheelchair bound and dependent on [employer] for transfers.  PT and OT recommended SNF and after palliative consultation, husband hopeful that patient would improve functionally with trial at rehab.  Insurance has unfortunately declined to cover SNF for rehab.  Will need to explore other options for adequate help in the home versus long-term placement.    Patient BMI: Body mass index is 19.86 kg/m.   DVT prophylaxis: enoxaparin (LOVENOX) injection 40 mg Start: 09/01/20 2000   Diet:  Diet Orders (From admission, onward)    Start     Ordered   09/05/20 0000  Diet - low sodium heart healthy        09/05/20 1528   09/02/20 0831  Diet regular Room service appropriate? Yes; Fluid consistency: Thin  Diet effective now       Question Answer Comment  Room service appropriate? Yes  Fluid consistency: Thin      09/02/20 0830            Code Status: DNR    Subjective 09/10/20    Patient seen with husband at bedside today.  She is sleeping comfortably, but wakes to voice, denies pain at this time.  Husband reports patient was complaining earlier of pain in her leg.  Started low-dose Remeron last night for appetite stimulation.  Discussed if this is too sedating for the patient, can try  an alternate medication.  For now, patient seems at her baseline and not to overly sedated.Marland Kitchen  He states she sleeps on and off a lot at home during the day as well.  Disposition Plan & Communication   Status is: Inpatient  Remains inpatient appropriate because: Requires long-term placement or arrangement for adequate home health services and caregiver support.  Patient's husband has appealed the denial for SNF and awaiting response from insurance.   Dispo: The patient is from: Home              Anticipated d/c is to: Home with home health versus long-term care              Anticipated d/c date is: TBD pending insurance appeal for SNF              Patient currently is medically stable to d/c.   Family Communication: Husband is at bedside on rounds today   Consults, Procedures, Significant Events   Consultants:   None  Procedures:   None  Antimicrobials:  Anti-infectives (From admission, onward)   None         Objective   Vitals:   09/10/20 0033 09/10/20 0449 09/10/20 0900 09/10/20 1127  BP: 121/63 118/65 118/78 116/63  Pulse: 97 95 94 87  Resp: 18 16 18 14   Temp: 100.2 F (37.9 C) 100 F (37.8 C) 100.2 F (37.9 C) 99.3 F (37.4 C)  TempSrc: Axillary Axillary Oral Axillary  SpO2: 95% 98% 94% 98%  Weight:      Height:        Intake/Output Summary (Last 24 hours) at 09/10/2020 1448 Last data filed at 09/10/2020 0451 Gross per 24 hour  Intake --  Output 200 ml  Net -200 ml   Filed Weights   09/03/20 0650 09/04/20 0100 09/05/20 0431  Weight: 54.6 kg 55.8 kg 52.5 kg    Physical Exam:  General exam: sleeping comfortably, wakes to voice, no acute distress Respiratory system: CTAB, on room air, normal respiratory effort Cardiovascular system: normal S1/S2, RRR, no pedal edema.   Gastrointestinal system: soft, NT, ND  Labs   Data Reviewed: I have personally reviewed following labs and imaging studies  CBC: Recent Labs  Lab 09/05/20 0514 09/06/20 0634  09/07/20 0443  WBC 8.2 7.7 8.6  NEUTROABS 6.4 5.7 6.6  HGB 11.5* 11.2* 10.9*  HCT 34.8* 33.7* 34.0*  MCV 89.9 89.2 91.2  PLT 445* 418* 400*   Basic Metabolic Panel: Recent Labs  Lab 09/05/20 0514 09/08/20 0434  NA 135 134*  K 4.0 3.9  CL 98 98  CO2 26 25  GLUCOSE 126* 96  BUN 11 14  CREATININE 0.62 0.55  CALCIUM 8.9 8.4*  PHOS 3.5  --    GFR: Estimated Creatinine Clearance: 44.2 mL/min (by C-G formula based on SCr of 0.55 mg/dL). Liver Function Tests: Recent Labs  Lab 09/05/20 0514  ALBUMIN 3.1*   No results for input(s): LIPASE, AMYLASE in the last 168 hours.  No results for input(s): AMMONIA in the last 168 hours. Coagulation Profile: No results for input(s): INR, PROTIME in the last 168 hours. Cardiac Enzymes: No results for input(s): CKTOTAL, CKMB, CKMBINDEX, TROPONINI in the last 168 hours. BNP (last 3 results) No results for input(s): PROBNP in the last 8760 hours. HbA1C: No results for input(s): HGBA1C in the last 72 hours. CBG: No results for input(s): GLUCAP in the last 168 hours. Lipid Profile: No results for input(s): CHOL, HDL, LDLCALC, TRIG, CHOLHDL, LDLDIRECT in the last 72 hours. Thyroid Function Tests: No results for input(s): TSH, T4TOTAL, FREET4, T3FREE, THYROIDAB in the last 72 hours. Anemia Panel: No results for input(s): VITAMINB12, FOLATE, FERRITIN, TIBC, IRON, RETICCTPCT in the last 72 hours. Sepsis Labs: No results for input(s): PROCALCITON, LATICACIDVEN in the last 168 hours.  Recent Results (from the past 240 hour(s))  SARS CORONAVIRUS 2 (TAT 6-24 HRS) Nasopharyngeal Nasopharyngeal Swab     Status: None   Collection Time: 09/02/20  8:12 PM   Specimen: Nasopharyngeal Swab  Result Value Ref Range Status   SARS Coronavirus 2 NEGATIVE NEGATIVE Final    Comment: (NOTE) SARS-CoV-2 target nucleic acids are NOT DETECTED.  The SARS-CoV-2 RNA is generally detectable in upper and lower respiratory specimens during the acute phase of  infection. Negative results do not preclude SARS-CoV-2 infection, do not rule out co-infections with other pathogens, and should not be used as the sole basis for treatment or other patient management decisions. Negative results must be combined with clinical observations, patient history, and epidemiological information. The expected result is Negative.  Fact Sheet for Patients: SugarRoll.be  Fact Sheet for Healthcare Providers: https://www.woods-mathews.com/  This test is not yet approved or cleared by the [place] FDA and  has been authorized for detection and/or diagnosis of SARS-CoV-2 by FDA under an Emergency Use Authorization (EUA). This EUA will remain  in effect (meaning this test can be used) for the duration of the COVID-19 declaration under Se ction 564(b)(1) of the Act, 21 U.S.C. section 360bbb-3(b)(1), unless the authorization is terminated or revoked sooner.  Performed at [facility], [place] [address] [place], [place] [address]   SARS Coronavirus 2 by RT PCR (hospital order, performed in [facility] hospital lab) Nasopharyngeal Nasopharyngeal Swab     Status: None   Collection Time: 09/03/20 10:00 AM   Specimen: Nasopharyngeal Swab  Result Value Ref Range Status   SARS Coronavirus 2 NEGATIVE NEGATIVE Final    Comment: (NOTE) SARS-CoV-2 target nucleic acids are NOT DETECTED.  The SARS-CoV-2 RNA is generally detectable in upper and lower respiratory specimens during the acute phase of infection. The lowest concentration of SARS-CoV-2 viral copies this assay can detect is 250 copies / mL. A negative result does not preclude SARS-CoV-2 infection and should not be used as the sole basis for treatment or other patient management decisions.  A negative result may occur with improper specimen collection / handling, submission of specimen other than nasopharyngeal swab, presence of viral mutation(s) within the areas  targeted by this assay, and inadequate number of viral copies (<250 copies / mL). A negative result must be combined with clinical observations, patient history, and epidemiological information.  Fact Sheet for Patients:   StrictlyIdeas.no  Fact Sheet for Healthcare Providers: BankingDealers.co.za  This test is not yet approved or  cleared by the [place] FDA and has been authorized for detection and/or diagnosis of SARS-CoV-2 by FDA under an Emergency Use Authorization (EUA).  This EUA will remain in  effect (meaning this test can be used) for the duration of the COVID-19 declaration under Section 564(b)(1) of the Act, 21 U.S.C. section 360bbb-3(b)(1), unless the authorization is terminated or revoked sooner.  Performed at [facility], [address] [place], [place] [address]   SARS Coronavirus 2 by RT PCR (hospital order, performed in [facility] hospital lab) Nasopharyngeal Nasopharyngeal Swab     Status: None   Collection Time: 09/05/20  3:35 PM   Specimen: Nasopharyngeal Swab  Result Value Ref Range Status   SARS Coronavirus 2 NEGATIVE NEGATIVE Final    Comment: (NOTE) SARS-CoV-2 target nucleic acids are NOT DETECTED.  The SARS-CoV-2 RNA is generally detectable in upper and lower respiratory specimens during the acute phase of infection. The lowest concentration of SARS-CoV-2 viral copies this assay can detect is 250 copies / mL. A negative result does not preclude SARS-CoV-2 infection and should not be used as the sole basis for treatment or other patient management decisions.  A negative result may occur with improper specimen collection / handling, submission of specimen other than nasopharyngeal swab, presence of viral mutation(s) within the areas targeted by this assay, and inadequate number of viral copies (<250 copies / mL). A negative result must be combined with clinical observations, patient history,  and epidemiological information.  Fact Sheet for Patients:   StrictlyIdeas.no  Fact Sheet for Healthcare Providers: BankingDealers.co.za  This test is not yet approved or  cleared by the [place] FDA and has been authorized for detection and/or diagnosis of SARS-CoV-2 by FDA under an Emergency Use Authorization (EUA).  This EUA will remain in effect (meaning this test can be used) for the duration of the COVID-19 declaration under Section 564(b)(1) of the Act, 21 U.S.C. section 360bbb-3(b)(1), unless the authorization is terminated or revoked sooner.  Performed at [facility], [address] [place], [place] [address]       Imaging Studies   No results found.   Medications   Scheduled Meds: . bisacodyl  5 mg Oral Daily  . donepezil  10 mg Oral QHS  . enoxaparin (LOVENOX) injection  40 mg Subcutaneous Q24H  . feeding supplement  1 Container Oral TID BM  . magnesium oxide  200 mg Oral Daily  . mirtazapine  7.5 mg Oral QHS  . senna-docusate  1 tablet Oral BID   Continuous Infusions:     LOS: 8 days    Time spent: [place], DO [facility]  09/10/2020, 2:48 PM    If 7PM-7AM, please contact night-coverage. How to contact the [facility] Attending or Consulting provider [place] or covering provider during after hours [place], for this patient?    1. Check the care team in [facility] and look for a) attending/consulting [facility] provider listed and b) the [facility] team listed 2. Log into [email or web address] and use [place]'s universal password to access. If you do not have the password, please contact the hospital operator. 3. Locate the [facility] provider you are looking for under [facility] and page to a number that you can be directly reached. 4. If you still have difficulty reaching the provider, please page the [facility] (Director on Call) for the Hospitalists listed on amion for assistance.

## 2020-09-11 ENCOUNTER — Ambulatory Visit: Payer: Medicare HMO | Admitting: Physical Therapy

## 2020-09-11 DIAGNOSIS — F0390 Unspecified dementia without behavioral disturbance: Secondary | ICD-10-CM | POA: Diagnosis not present

## 2020-09-11 DIAGNOSIS — R1084 Generalized abdominal pain: Secondary | ICD-10-CM | POA: Diagnosis not present

## 2020-09-11 DIAGNOSIS — I10 Essential (primary) hypertension: Secondary | ICD-10-CM | POA: Diagnosis not present

## 2020-09-11 LAB — CBC
HCT: 34 % — ABNORMAL LOW (ref 36.0–46.0)
Hemoglobin: 10.9 g/dL — ABNORMAL LOW (ref 12.0–15.0)
MCH: 29.2 pg (ref 26.0–34.0)
MCHC: 32.1 g/dL (ref 30.0–36.0)
MCV: 91.2 fL (ref 80.0–100.0)
Platelets: 417 10*3/uL — ABNORMAL HIGH (ref 150–400)
RBC: 3.73 MIL/uL — ABNORMAL LOW (ref 3.87–5.11)
RDW: 14 % (ref 11.5–15.5)
WBC: 8.4 10*3/uL (ref 4.0–10.5)
nRBC: 0 % (ref 0.0–0.2)

## 2020-09-11 NOTE — Progress Notes (Signed)
Nutrition Follow-up  DOCUMENTATION CODES:   Non-severe (moderate) malnutrition in context of chronic illness  INTERVENTION:  Continue Boost Breeze po TID, each supplement provides 250 kcal and 9 grams of protein.  Continue Magic cup TID with meals, each supplement provides 290 kcal and 9 grams of protein.  Patient is documented to have allergies to soy, eggs, and Latex, all which limit options available from kitchen. Recommend assessing if these are true allergies and then removing from list if they are not to allow patient more options.  NUTRITION DIAGNOSIS:   Moderate Malnutrition related to chronic illness (breast cancer, dementia) as evidenced by moderate muscle depletion, moderate fat depletion.  Ongoing.  GOAL:   Patient will meet greater than or equal to 90% of their needs  Progressing.  MONITOR:   PO intake, Supplement acceptance, Labs, Weight trends, I & O's, Skin  REASON FOR ASSESSMENT:   Malnutrition Screening Tool    ASSESSMENT:   85 y.o. female with medical history significant of breast cancer with history of right breast lumpectomy, radiation and chemotherapy, dementia, hypertension who is brought to the emergency department from home via EMS due to complaints of generalized weakness, abdominal pain and constipation.  Met with patient and her husband at bedside. Patient only occasionally able to answer with 1-2 words so most of history provided by husband. He reports patient ate well at breakfast and ate 100% of her meal. He reports she did not eat well at lunch. PO intake has been variable during admission (0-100%). Husband reports she is drinking Producer, television/film/video. Patient also had a bottle of Ensure Plus at bedside, which contains soy, and he reports patient tolerated a few sips with no issues (this was not ordered by RD due to documented soy allergy). Husband concerned about limited options available from the kitchen. Patient is documented to  have allergies to soy, eggs, and Latex, all which limit options available from kitchen. Recommend assessing if these are true allergies and then removing from list if they are not to allow patient more options.  Medications reviewed and include: magnesium oxide 200 mg daily, Remeron 7.5 mg daily (ordered on 12/4), senna-docusate 1 tablet BID.  Labs reviewed: Sodium 134.  Diet Order:   Diet Order            Diet - low sodium heart healthy           Diet regular Room service appropriate? Yes; Fluid consistency: Thin  Diet effective now                EDUCATION NEEDS:   No education needs have been identified at this time  Skin:  Skin Assessment: Reviewed RN Assessment  Last BM:  09/09/2020 - medium type 4  Height:   Ht Readings from Last 1 Encounters:  09/01/20 $RemoveB'5\' 4"'TvTNOmbU$  (1.626 m)   Weight:   Wt Readings from Last 1 Encounters:  09/05/20 52.5 kg   Ideal Body Weight:  54.5 kg  BMI:  Body mass index is 19.86 kg/m.  Estimated Nutritional Needs:   Kcal:  1400-1600kcal/day  Protein:  70-80g/day  Fluid:  1.4-1.6L/day  Jacklynn Barnacle, MS, RD, LDN Pager number available on Amion

## 2020-09-11 NOTE — Progress Notes (Signed)
PROGRESS NOTE    Cynthia Walker   QJJ:[id number]  DOB: 25-Dec-1934  PCP: Sofie Hartigan, MD    DOA: 09/01/2020 LOS: 52   Brief Narrative   85 year old female with hx of right breast cancer with lumpectomy 2009 status post XRT, chemo HTN, moderate dementia came to ED with generalized weakness, abdominal pain and severe constipation X 2 weeks despite MiraLax at home.  Pt has become challenging for husband to care for at home since she has recently become unable to ambulate and requiring Hoyer lift for transfers.  Also with poor appetite.    Evaluation in the ED including CT abdomen/pelvis showed moderate stool volume in colon, no other acute findings.  There was however opacity in the RML with lateral right 5th rib erosion, concerning for malignancy.  Patient admitted and treated with aggressive bowel regimen, with resolution of constipation.  Remains on routine stool softeners.  PO intake has improved somewhat, but remains poor.  Palliative care was consulted and current goal of care is to attempt trial of rehab to see if patient's mobility able to improve, as husband would like to be able to continue caring for her at home.        Assessment & Plan   Principal Problem:   Abdominal pain Active Problems:   Essential hypertension   Normocytic anemia   Dementia (HCC)   Moderate protein malnutrition (HCC)   Hypokalemia   Constipation   Metabolic encephalopathy  Patient is medically stable for discharge to SNF pending insurance authorization.     Abdominal pain secondary to constipation -present on admission, resolved after aggressive bowel regimen. 12/2: Per husband, stool remains "dry and hard".  RN did report small BM earlier today. --Continue senna twice daily, Dulcolax daily --Will use suppository or enema as needed if no BM in 24 to 48 hours  Anorexia -suspect poor appetite is due to progressive dementia, it does seem to wax and wane.   Will try low-dose  Remeron and monitor.  Recent UTI -08/14/2020, treated with Cipro x6 days.  UA on admission was negative.  Hypokalemia -present on admission, resolved after K was replaced.  Monitor BMP and replace as needed.  Breast cancer status post lumpectomy, XRT -concerning findings on CT scanned this admission involving right long and lateral fifth rib which appears eroded and concerning for malignancy.  Due to patient's advancing dementia and poor overall prognosis, no further evaluation at this time.  Patient would be a poor candidate for any intervention including surgery or chemo/radiation.  Hypertension -not on antihypertensives outpatient.  Monitor BP.  Moderate to severe dementia -appears recently progressing.  Has been wheelchair bound and dependent on [place] for transfers.  PT and OT recommended SNF and after palliative consultation, husband hopeful that patient would improve functionally with trial at rehab.  Insurance has unfortunately declined to cover SNF for rehab.  Will need to explore other options for adequate help in the home versus long-term placement. --Follow-up on insurance appeal decision    Patient BMI: Body mass index is 19.86 kg/m.   DVT prophylaxis: enoxaparin (LOVENOX) injection 40 mg Start: 09/01/20 2000   Diet:  Diet Orders (From admission, onward)    Start     Ordered   09/05/20 0000  Diet - low sodium heart healthy        09/05/20 1528   09/02/20 0831  Diet regular Room service appropriate? Yes; Fluid consistency: Thin  Diet effective now       Question  Answer Comment  Room service appropriate? Yes   Fluid consistency: Thin      09/02/20 0830            Code Status: DNR    Subjective 09/11/20    Patient seen with husband at bedside today.  She is resting comfortably.  Husband states she ate well for breakfast, he was getting ready to sit her up for some lunch.  She is having BMs.  She wakes to voice and denies any pain or other acute  complaints.  Disposition Plan & Communication   Status is: Inpatient  Remains inpatient appropriate because: Requires long-term placement or arrangement for adequate home health services and caregiver support.  Patient's husband has appealed the denial for SNF and awaiting response from insurance.   Dispo: The patient is from: Home              Anticipated d/c is to: Home with home health versus long-term care              Anticipated d/c date is: TBD pending insurance appeal for SNF              Patient currently is medically stable to d/c.   Family Communication: Husband is at bedside on rounds today   Consults, Procedures, Significant Events   Consultants:   None  Procedures:   None  Antimicrobials:  Anti-infectives (From admission, onward)   None         Objective   Vitals:   09/11/20 0039 09/11/20 0504 09/11/20 0736 09/11/20 1202  BP: (!) 105/59 (!) 100/58 116/62 (!) 114/59  Pulse: 84 78 74 88  Resp: 16 18 20 16   Temp: 98 F (36.7 C) 98.1 F (36.7 C) 98.1 F (36.7 C) 98.4 F (36.9 C)  TempSrc:   Axillary   SpO2: 98% 99% 97% 98%  Weight:      Height:        Intake/Output Summary (Last 24 hours) at 09/11/2020 1536 Last data filed at 09/11/2020 1011 Gross per 24 hour  Intake 120 ml  Output 200 ml  Net -80 ml   Filed Weights   09/03/20 0650 09/04/20 0100 09/05/20 0431  Weight: 54.6 kg 55.8 kg 52.5 kg    Physical Exam:  General exam: sleeping comfortably, wakes to voice, no acute distress Respiratory system: CTAB, on room air, normal respiratory effort Cardiovascular system: normal S1/S2, RRR, no pedal edema.   Gastrointestinal system: soft, NT, ND  Labs   Data Reviewed: I have personally reviewed following labs and imaging studies  CBC: Recent Labs  Lab 09/05/20 0514 09/06/20 0634 09/07/20 0443 09/11/20 0449  WBC 8.2 7.7 8.6 8.4  NEUTROABS 6.4 5.7 6.6  --   HGB 11.5* 11.2* 10.9* 10.9*  HCT 34.8* 33.7* 34.0* 34.0*  MCV 89.9 89.2  91.2 91.2  PLT 445* 418* 435* 762*   Basic Metabolic Panel: Recent Labs  Lab 09/05/20 0514 09/08/20 0434  NA 135 134*  K 4.0 3.9  CL 98 98  CO2 26 25  GLUCOSE 126* 96  BUN 11 14  CREATININE 0.62 0.55  CALCIUM 8.9 8.4*  PHOS 3.5  --    GFR: Estimated Creatinine Clearance: 44.2 mL/min (by C-G formula based on SCr of 0.55 mg/dL). Liver Function Tests: Recent Labs  Lab 09/05/20 0514  ALBUMIN 3.1*   No results for input(s): LIPASE, AMYLASE in the last 168 hours. No results for input(s): AMMONIA in the last 168 hours. Coagulation Profile: No results for  input(s): INR, PROTIME in the last 168 hours. Cardiac Enzymes: No results for input(s): CKTOTAL, CKMB, CKMBINDEX, TROPONINI in the last 168 hours. BNP (last 3 results) No results for input(s): PROBNP in the last 8760 hours. HbA1C: No results for input(s): HGBA1C in the last 72 hours. CBG: No results for input(s): GLUCAP in the last 168 hours. Lipid Profile: No results for input(s): CHOL, HDL, LDLCALC, TRIG, CHOLHDL, LDLDIRECT in the last 72 hours. Thyroid Function Tests: No results for input(s): TSH, T4TOTAL, FREET4, T3FREE, THYROIDAB in the last 72 hours. Anemia Panel: No results for input(s): VITAMINB12, FOLATE, FERRITIN, TIBC, IRON, RETICCTPCT in the last 72 hours. Sepsis Labs: No results for input(s): PROCALCITON, LATICACIDVEN in the last 168 hours.  Recent Results (from the past 240 hour(s))  SARS CORONAVIRUS 2 (TAT 6-24 HRS) Nasopharyngeal Nasopharyngeal Swab     Status: None   Collection Time: 09/02/20  8:12 PM   Specimen: Nasopharyngeal Swab  Result Value Ref Range Status   SARS Coronavirus 2 NEGATIVE NEGATIVE Final    Comment: (NOTE) SARS-CoV-2 target nucleic acids are NOT DETECTED.  The SARS-CoV-2 RNA is generally detectable in upper and lower respiratory specimens during the acute phase of infection. Negative results do not preclude SARS-CoV-2 infection, do not rule out co-infections with other  pathogens, and should not be used as the sole basis for treatment or other patient management decisions. Negative results must be combined with clinical observations, patient history, and epidemiological information. The expected result is Negative.  Fact Sheet for Patients: SugarRoll.be  Fact Sheet for Healthcare Providers: https://www.woods-mathews.com/  This test is not yet approved or cleared by the [place] FDA and  has been authorized for detection and/or diagnosis of SARS-CoV-2 by FDA under an Emergency Use Authorization (EUA). This EUA will remain  in effect (meaning this test can be used) for the duration of the COVID-19 declaration under Se ction 564(b)(1) of the Act, 21 U.S.C. section 360bbb-3(b)(1), unless the authorization is terminated or revoked sooner.  Performed at [facility], [place] [address] [place], [place] [address]   SARS Coronavirus 2 by RT PCR (hospital order, performed in [facility] hospital lab) Nasopharyngeal Nasopharyngeal Swab     Status: None   Collection Time: 09/03/20 10:00 AM   Specimen: Nasopharyngeal Swab  Result Value Ref Range Status   SARS Coronavirus 2 NEGATIVE NEGATIVE Final    Comment: (NOTE) SARS-CoV-2 target nucleic acids are NOT DETECTED.  The SARS-CoV-2 RNA is generally detectable in upper and lower respiratory specimens during the acute phase of infection. The lowest concentration of SARS-CoV-2 viral copies this assay can detect is 250 copies / mL. A negative result does not preclude SARS-CoV-2 infection and should not be used as the sole basis for treatment or other patient management decisions.  A negative result may occur with improper specimen collection / handling, submission of specimen other than nasopharyngeal swab, presence of viral mutation(s) within the areas targeted by this assay, and inadequate number of viral copies (<250 copies / mL). A negative result must be  combined with clinical observations, patient history, and epidemiological information.  Fact Sheet for Patients:   StrictlyIdeas.no  Fact Sheet for Healthcare Providers: BankingDealers.co.za  This test is not yet approved or  cleared by the [place] FDA and has been authorized for detection and/or diagnosis of SARS-CoV-2 by FDA under an Emergency Use Authorization (EUA).  This EUA will remain in effect (meaning this test can be used) for the duration of the COVID-19 declaration under  Section 564(b)(1) of the Act, 21 U.S.C. section 360bbb-3(b)(1), unless the authorization is terminated or revoked sooner.  Performed at [facility], [address] [place], [place] [address]   SARS Coronavirus 2 by RT PCR (hospital order, performed in [facility] hospital lab) Nasopharyngeal Nasopharyngeal Swab     Status: None   Collection Time: 09/05/20  3:35 PM   Specimen: Nasopharyngeal Swab  Result Value Ref Range Status   SARS Coronavirus 2 NEGATIVE NEGATIVE Final    Comment: (NOTE) SARS-CoV-2 target nucleic acids are NOT DETECTED.  The SARS-CoV-2 RNA is generally detectable in upper and lower respiratory specimens during the acute phase of infection. The lowest concentration of SARS-CoV-2 viral copies this assay can detect is 250 copies / mL. A negative result does not preclude SARS-CoV-2 infection and should not be used as the sole basis for treatment or other patient management decisions.  A negative result may occur with improper specimen collection / handling, submission of specimen other than nasopharyngeal swab, presence of viral mutation(s) within the areas targeted by this assay, and inadequate number of viral copies (<250 copies / mL). A negative result must be combined with clinical observations, patient history, and epidemiological information.  Fact Sheet for Patients:    StrictlyIdeas.no  Fact Sheet for Healthcare Providers: BankingDealers.co.za  This test is not yet approved or  cleared by the [place] FDA and has been authorized for detection and/or diagnosis of SARS-CoV-2 by FDA under an Emergency Use Authorization (EUA).  This EUA will remain in effect (meaning this test can be used) for the duration of the COVID-19 declaration under Section 564(b)(1) of the Act, 21 U.S.C. section 360bbb-3(b)(1), unless the authorization is terminated or revoked sooner.  Performed at [facility], [address] [place], [place] [address]       Imaging Studies   No results found.   Medications   Scheduled Meds: . bisacodyl  5 mg Oral Daily  . donepezil  10 mg Oral QHS  . enoxaparin (LOVENOX) injection  40 mg Subcutaneous Q24H  . feeding supplement  1 Container Oral TID BM  . magnesium oxide  200 mg Oral Daily  . mirtazapine  7.5 mg Oral QHS  . senna-docusate  1 tablet Oral BID   Continuous Infusions:     LOS: 9 days    Time spent: [place], DO [facility]  09/11/2020, 3:36 PM    If 7PM-7AM, please contact night-coverage. How to contact the [facility] Attending or Consulting provider [place] or covering provider during after hours [place], for this patient?    1. Check the care team in [facility] and look for a) attending/consulting TRH provider listed and b) the [facility] team listed 2. Log into [email or web address] and use [place]'s universal password to access. If you do not have the password, please contact the hospital operator. 3. Locate the [facility] provider you are looking for under [facility] and page to a number that you can be directly reached. 4. If you still have difficulty reaching the provider, please page the [facility] (Director on Call) for the Hospitalists listed on amion for assistance.

## 2020-09-11 NOTE — Care Management Important Message (Signed)
Important Message  Patient Details  Name: Cynthia Walker MRN: [id number] Date of Birth: [birth year]   Medicare Important Message Given:  Yes     Dannette Barbara 09/11/2020, 10:38 AM

## 2020-09-11 NOTE — Progress Notes (Signed)
Physical Therapy Treatment Patient Details Name: Cynthia Walker MRN: [id number] DOB: Sep 12, 1935 Today's Date: 09/11/2020    History of Present Illness Pt is an 85 y/o F With PMH: R BRCA s/p lumpectomy, XRT and Chemo 2009 as well as HTN and moderate dementia. Had IM nailing after fall to L hip 06/2019. Pt presented to ED d/t abdominal pain, weakness, and constipation x2 weeks. Low probablility of infection as pt just finished Rx for UTI. Concerns for cancer mets to R fifth rib per MD note.    PT Comments    Patient sleeping upon arrival to room; husband at bedside.  Husband very supportive of patient; hopeful for "just being able to stand again" to facilitate return home. Provides extensive information regarding daily routine, level of care provided and equipment in place at home.  Endorses use of hoyer lift for bed/chair (manual WC) for past two months; able to stand (holding grab bar) in bathroom for exchange of WC to BSC (standing as recently as three weeks prior to admission) with assist as needed from husband.  Does endorse progressive decline in most recent three weeks.  Has not been ambulatory as primary mobility since hip fracture/repair approx 2 years prior.  Patient grossly lethargic this date, difficulty maintaining eyes open and functional alertness throughout session.  Currently requiring increased assist for bed mobility; limited ability to actively assist with any movement transition this date.  Minimally conversive and interactive with therapist today (husband does endorse good awake/alert spell near breakfast this morning, but admits sleep/wake cycle disrupted with hospitalization).  Unsafe to attempt standing at this time. Will continue to follow with goal of facilitating standing attempts (likely best to simulate home environment when able-pulling up on grab bar-for optimal patient comprehension/performance) to promote return to PLOF and safe return home with husband at discharge.    Follow Up Recommendations  SNF (pending ability to participate/progress)     Equipment Recommendations       Recommendations for Other Services       Precautions / Restrictions Precautions Precautions: Fall Restrictions Weight Bearing Restrictions: No    Mobility  Bed Mobility Overal bed mobility: Needs Assistance Bed Mobility: Supine to Sit;Sit to Supine     Supine to sit: Total assist;+2 for physical assistance Sit to supine: +2 for physical assistance;Total assist   General bed mobility comments: very limited dissociation of trunk/extremities with transitional movements  Transfers                 General transfer comment: deferred due to lethargy this date  Ambulation/Gait             General Gait Details: non-ambulatory at baseline   Stairs             Wheelchair Mobility    Modified Rankin (Stroke Patients Only)       Balance Overall balance assessment: Needs assistance Sitting-balance support: No upper extremity supported;Feet supported Sitting balance-Leahy Scale: Zero Sitting balance - Comments: absent balance/righting reactions; tendancy towards posterior LOB/weight shift.  Max/total assist to maintain balance edge of bed                                    Cognition Arousal/Alertness: Lethargic (intermittent eye opening during session, max cuing/stimulation to achieve) Behavior During Therapy: Flat affect Overall Cognitive Status: History of cognitive impairments - at baseline  General Comments: does not follow commands or purposefully interact with therapist during session; generally lethargic, max cuing/stimulation to open eyes/maintain alertness      Exercises Other Exercises Other Exercises: Attempted supine LE therex/movement, passive ROM-limited tolerance for movement, grimacing/moaning with movement of L > R LE; intermittently resistant to passive movement.   Generally stiff with limited ROM available Other Exercises: Rolling bilat, total assist +2 for peri-care after incontinent bladder episode; minimal attempts to actively participate/assist with transitional movements    General Comments        Pertinent Vitals/Pain Pain Assessment: Faces Faces Pain Scale: Hurts even more Pain Location: tearful, moaning throughout session; unable to localize, describe Pain Descriptors / Indicators: Grimacing;Guarding;Moaning Pain Intervention(s): Limited activity within patient's tolerance;Monitored during session;Repositioned    Home Living                      Prior Function            PT Goals (current goals can now be found in the care plan section) Acute Rehab PT Goals Patient Stated Goal: "I just want her to be able to stand" PT Goal Formulation: With family Time For Goal Achievement: 09/18/20 Potential to Achieve Goals: Fair Progress towards PT goals: Not progressing toward goals - comment (limited progression this date due to lethargy)    Frequency    Min 2X/week      PT Plan Current plan remains appropriate    Co-evaluation              AM-PAC PT "6 Clicks" Mobility   Outcome Measure  Help needed turning from your back to your side while in a flat bed without using bedrails?: Total Help needed moving from lying on your back to sitting on the side of a flat bed without using bedrails?: Total Help needed moving to and from a bed to a chair (including a wheelchair)?: Total Help needed standing up from a chair using your arms (e.g., wheelchair or bedside chair)?: Total Help needed to walk in hospital room?: Total Help needed climbing 3-5 steps with a railing? : Total 6 Click Score: 6    End of Session   Activity Tolerance: Patient limited by fatigue;Patient limited by pain;Other (comment) (cognition) Patient left: in bed;with bed alarm set;with call bell/phone within reach Nurse Communication: Mobility  status PT Visit Diagnosis: Other abnormalities of gait and mobility (R26.89);Muscle weakness (generalized) (M62.81)     Time: 1610-9604 PT Time Calculation (min) (ACUTE ONLY): 30 min  Charges:  $Therapeutic Activity: 23-37 mins                     Terrie Grajales H. Owens Shark, PT, DPT, NCS 09/11/20, 1:20 PM [phone]

## 2020-09-11 NOTE — Progress Notes (Signed)
OT Cancellation Note  Patient Details Name: Cynthia Walker MRN: [id number] DOB: [birth year]   Cancelled Treatment:    Reason Eval/Treat Not Completed: Other (comment). Dietician entering pt's room upon attempt. Will re-attempt at later date/time as pt is available and appropriate.   Jeni Salles, MPH, MS, OTR/L ascom [phone] 09/11/20, 3:47 PM

## 2020-09-11 NOTE — TOC Progression Note (Signed)
Transition of Care [facility]) - Progression Note    Patient Details  Name: Cynthia Walker MRN: [id number] Date of Birth: [birth year]  [facility]) CM/SW Contact  Shelbie Hutching, RN Phone Number: 09/11/2020, 11:30 AM  Clinical Narrative:    RNCM spoke with [facility] appeals this morning and they are requesting additional clinicals.  Last PT note from 12/2.  PT reports that they will see patient today.  RNCM will fax updated clinicals to [facility] at [place] [phone].   Expected Discharge Plan: Skilled Nursing Facility Barriers to Discharge: Insurance Authorization  Expected Discharge Plan and Services Expected Discharge Plan: [place]   Discharge Planning Services: CM Consult Post Acute Care Choice: [place] arrangements for the past 2 months: Single Family Home Expected Discharge Date: 09/06/20                 DME Agency: NA       HH Arranged: NA           Social Determinants of Health (SDOH) Interventions    Readmission Risk Interventions No flowsheet data found.

## 2020-09-12 DIAGNOSIS — F0390 Unspecified dementia without behavioral disturbance: Secondary | ICD-10-CM | POA: Diagnosis not present

## 2020-09-12 DIAGNOSIS — K5900 Constipation, unspecified: Secondary | ICD-10-CM | POA: Diagnosis not present

## 2020-09-12 DIAGNOSIS — R1084 Generalized abdominal pain: Secondary | ICD-10-CM | POA: Diagnosis not present

## 2020-09-12 LAB — RESP PANEL BY RT-PCR (FLU A&B, COVID) ARPGX2
Influenza A by PCR: NEGATIVE
Influenza B by PCR: NEGATIVE
SARS Coronavirus 2 by RT PCR: NEGATIVE

## 2020-09-12 MED ORDER — BISACODYL 5 MG PO TBEC: 5.0000 mg | DELAYED_RELEASE_TABLET | Freq: Every day | ORAL | 0 refills | Status: AC | PRN

## 2020-09-12 MED ORDER — MIRTAZAPINE 7.5 MG PO TABS: 7.5000 mg | ORAL_TABLET | Freq: Every day | ORAL | Status: AC

## 2020-09-12 NOTE — TOC Transition Note (Signed)
Transition of Care [facility]) - CM/SW Discharge Note   Patient Details  Name: Cynthia Walker MRN: [id number] Date of Birth: [birth year]  Transition [facility]) CM/SW Contact:  Shelbie Hutching, RN Phone Number: 09/12/2020, 12:33 PM   Clinical Narrative:    [place] approved patient for 5 days at [facility] for skilled rehab.  [place] has a bed today and patient will discharge.  Patient is going to room 405, bedside RN will call report to [phone].  RNCM will arrange EMS transport once report has been called.   Final next level of care: Skilled Nursing Facility Barriers to Discharge: Barriers Resolved   Patient Goals and CMS Choice Patient states their goals for this hospitalization and ongoing recovery are:: Husband would like for patient to go for short term theapy at [facility].gov Compare Post Acute Care list provided to:: Patient Represenative (must comment) Choice offered to / list presented to : Spouse  Discharge Placement   Existing PASRR number confirmed : 09/06/20          Patient chooses bed at: [facility] Patient to be transferred to facility by: [facility] Name of family member notified: Richard Patient and family notified of of transfer: 09/12/20  Discharge Plan and Services   Discharge Planning Services: CM Consult Post Acute Care Choice: [place]            DME Agency: NA       HH Arranged: NA          Social Determinants of Health (SDOH) Interventions     Readmission Risk Interventions No flowsheet data found.

## 2020-09-12 NOTE — TOC Progression Note (Signed)
Transition of Care [facility]) - Progression Note    Patient Details  Name: AMAURA AUTHIER MRN: [id number] Date of Birth: 1934-11-15  Transition of Care [facility]) CM/SW Contact  Shelbie Hutching, RN Phone Number: 09/12/2020, 2:01 PM  Clinical Narrative:    EMS has been arranged.  ETA 2 hours.   Expected Discharge Plan: [place] Barriers to Discharge: Barriers Resolved  Expected Discharge Plan and Services Expected Discharge Plan: [place]   Discharge Planning Services: CM Consult Post Acute Care Choice: [place] Living arrangements for the past 2 months: Single Family Home Expected Discharge Date: 09/12/20                 DME Agency: NA       HH Arranged: NA           Social Determinants of Health (SDOH) Interventions    Readmission Risk Interventions No flowsheet data found.

## 2020-09-12 NOTE — Discharge Summary (Signed)
Physician Discharge Summary  JAJAIRA RUIS JEH:[id number] DOB: March 26, 1935 DOA: 09/01/2020  PCP: Sofie Hartigan, MD  Admit date: 09/01/2020 Discharge date: 09/12/2020  Admitted From: home Disposition:  SNF  Recommendations for Outpatient Follow-up:  1. Follow up with PCP in 1-2 weeks 2. Please obtain BMP/CBC in one week 3. Please make sure to maintain bowel regularity and give PRN stool softeners in no BM with scheduled meds.  Home Health: No  Equipment/Devices: None   Discharge Condition: Stable  CODE STATUS: DNR   Diet recommendation: Heart Healthy with supplements: Boost Breeze po TID Magic cup TID with meals  Discharge Diagnoses: Principal Problem:   Abdominal pain Active Problems:   Essential hypertension   Normocytic anemia   Dementia (HCC)   Moderate protein malnutrition (HCC)   Hypokalemia   Constipation   Metabolic encephalopathy    Summary of HPI and Hospital Course:  85 year old female with hx of right breast cancer with lumpectomy 2009 status post XRT, chemo HTN, moderate dementia came to ED with generalized weakness, abdominal pain and severe constipation X 2 weeks despite MiraLax at home.  Pt has become challenging for husband to care for at home since she has recently become unable to ambulate and requiring [facility] lift for transfers.  Also with poor appetite.    Evaluation in the ED including CT abdomen/pelvis showed moderate stool volume in colon, no other acute findings.  There was however opacity in the RML with lateral right 5th rib erosion, concerning for malignancy.  Patient admitted and treated with aggressive bowel regimen, with resolution of constipation.  Remains on routine stool softeners.  PO intake has improved somewhat, but remains poor.  Palliative care was consulted and current goal of care is to attempt trial of rehab to see if patient's mobility able to improve, as husband would like to be able to continue caring for her at home.        A&P: Abdominal pain secondary to constipation -present on admission, resolved after aggressive bowel regimen.  Use scheduled and PRN stool softeners per orders. Use suppository or enema as needed if no BM in 48-72 hours. Encourage patient to drink plenty of fluids.  Anorexia -suspect poor appetite is due to progressive dementia, it does seem to wax and wane.   Started on low-dose Remeron 7.5 mg qHS.  Recent UTI -08/14/2020, treated with Cipro x6 days.  UA on admission was negative.  Hypokalemia -present on admission, resolved after K was replaced.  Get BMP in 1 week.  Breast cancer status post lumpectomy, XRT -concerning findings on CT scanned this admission involving right long and lateral fifth rib which appears eroded and concerning for malignancy.  Due to patient's advancing dementia and poor overall prognosis, no further evaluation at this time.  Patient would be a poor candidate for any intervention including surgery or chemo/radiation.  Hypertension -not on antihypertensives outpatient.  Monitor BP.  Moderate to severe dementia -appears recently progressing.  Has been wheelchair bound and dependent on [facility] for transfers.  PT and OT recommended SNF. Husband met with palliative care and decided to pursue trial of short term rehab in hopes patient can recover her ability to stand so he can continue to care for her in their home.      Discharge Instructions   Discharge Instructions    Call MD for:  persistant nausea and vomiting   Complete by: As directed    Call MD for:  severe uncontrolled pain   Complete by: As  directed    Call MD for:  temperature >100.4   Complete by: As directed    Diet - low sodium heart healthy   Complete by: As directed    Diet - low sodium heart healthy   Complete by: As directed    Increase activity slowly   Complete by: As directed    Increase activity slowly   Complete by: As directed      Allergies as of 09/12/2020       Reactions   Flu Virus Vaccine    Other reaction(s): Other (See Comments) Stomach cramps   Latex Swelling   Other    Other reaction(s): Unknown Allergy to eggs   Procaine Other (See Comments)   weakness   Soy Isoflavones    Other reaction(s): Other (See Comments) Soy causes legs to feel like rubber   Penicillins Rash   Has patient had a PCN reaction causing immediate rash, facial/tongue/throat swelling, SOB or lightheadedness with hypotension: No Has patient had a PCN reaction causing severe rash involving mucus membranes or skin necrosis: No Has patient had a PCN reaction that required hospitalization: No Has patient had a PCN reaction occurring within the last 10 years: No If all of the above answers are "NO", then may proceed with Cephalosporin use.      Medication List    TAKE these medications   acetaminophen 325 MG tablet Commonly known as: TYLENOL Take 2 tablets (650 mg total) by mouth every 6 (six) hours as needed for mild pain (or Fever >/= 101).   Adult Gummy Chew Chew 1 tablet by mouth daily.   bisacodyl 5 MG EC tablet Commonly known as: DULCOLAX Take 1 tablet (5 mg total) by mouth daily as needed for moderate constipation (if no BM in 24 hours).   donepezil 10 MG disintegrating tablet Commonly known as: ARICEPT ODT Take 10 mg by mouth at bedtime.   mirtazapine 7.5 MG tablet Commonly known as: REMERON Take 1 tablet (7.5 mg total) by mouth at bedtime.   polyethylene glycol 17 g packet Commonly known as: MIRALAX / GLYCOLAX Take 17 g by mouth daily as needed for mild constipation or moderate constipation.   senna-docusate 8.6-50 MG tablet Commonly known as: Senokot-S Take 1 tablet by mouth 2 (two) times daily.       Contact information for follow-up providers    Please follow up.   Why: Facility to make any and all follow up appt           Contact information for after-discharge care    Destination    [facility] .    Service: Skilled Nursing Contact information: [address] [place] [address] [phone]                 Allergies  Allergen Reactions   Flu Virus Vaccine     Other reaction(s): Other (See Comments) Stomach cramps   Latex Swelling   Other     Other reaction(s): Unknown Allergy to eggs   Procaine Other (See Comments)    weakness   Soy Isoflavones     Other reaction(s): Other (See Comments) Soy causes legs to feel like rubber   Penicillins Rash    Has patient had a PCN reaction causing immediate rash, facial/tongue/throat swelling, SOB or lightheadedness with hypotension: No Has patient had a PCN reaction causing severe rash involving mucus membranes or skin necrosis: No Has patient had a PCN reaction that required hospitalization: No Has patient  had a PCN reaction occurring within the last 10 years: No If all of the above answers are "NO", then may proceed with Cephalosporin use.     Consultations:  Palliative care    Procedures/Studies: CT ABDOMEN PELVIS W CONTRAST  Result Date: 09/01/2020 CLINICAL DATA:  Acute abdominal pain, constipation EXAM: CT ABDOMEN AND PELVIS WITH CONTRAST TECHNIQUE: Multidetector CT imaging of the abdomen and pelvis was performed using the standard protocol following bolus administration of intravenous contrast. CONTRAST:  13mL OMNIPAQUE IOHEXOL 300 MG/ML  SOLN COMPARISON:  04/04/2015, 01/14/2020 FINDINGS: Technical note: Examination is mildly degraded by motion artifact as well as beam hardening artifact related to the positioning of the right arm positioned over the abdomen. Lower chest: Wedge-shaped opacity within the periphery of the right middle lobe, measuring approximately 2.5 x 1.4 cm (series 4, image 3), which has increased in size from 01/14/2020. The adjacent lateral right fifth rib appears to be eroded (series 2, images 3-4). Left basilar atelectasis. Heart size within normal limits. Likely  postlumpectomy changes within the inferior aspect of the right breast. Hepatobiliary: No interval change in appearance of 2 low-density lesions within the liver. Liver appears otherwise within normal limits. Motion degraded appearance of the gallbladder. No evidence for gallstone or inflammation. Pancreas: Grossly unremarkable. Spleen: Normal in size without focal abnormality. Adrenals/Urinary Tract: Unremarkable adrenal glands. Simple 4 cm interpolar cyst within the right kidney. The kidneys have otherwise symmetric enhancement. No renal stone or hydronephrosis. Urinary bladder is within normal limits. Stomach/Bowel: Stomach is within normal limits. Diverticulosis within the sigmoid colon. No evidence of bowel wall thickening, distention, or inflammatory changes. Moderate volume of stool within the colon. Vascular/Lymphatic: Aortoiliac atherosclerotic calcifications without aneurysm. Mildly dilated gonadal and pelvic veins, left greater than right. No abdominopelvic lymphadenopathy. Reproductive: Uterus and bilateral adnexa are unremarkable. Other: No free fluid. No abdominopelvic fluid collection. No pneumoperitoneum. No abdominal wall hernia. Musculoskeletal: Prior left hip ORIF.  No acute osseous findings. IMPRESSION: 1. No acute abdominopelvic findings. 2. Wedge-shaped opacity within the periphery of the right middle lobe, measuring approximately 2.5 x 1.4 cm, increased in size from 01/14/2020. The adjacent lateral right fifth rib appears to be eroded. Findings are concerning for malignancy. Further evaluation with dedicated contrast enhanced chest CT is recommended. 3. Moderate volume of stool within the colon. 4. Sigmoid diverticulosis without evidence of acute diverticulitis. 5. Mildly dilated gonadal and pelvic veins, left greater than right, which can be seen in the setting of pelvic congestion syndrome. 6. Likely postlumpectomy changes within the inferior aspect of the right breast. Correlate with recent  mammogram. 7. Aortic atherosclerosis. Electronically Signed   By: Davina Poke D.O.   On: 09/01/2020 11:21       Subjective: Pt seen with husband at bedside.  No acute events overnight.  Appetite is fair.  Having BM's, although they seem dry per husband.     Discharge Exam: Vitals:   09/12/20 0300 09/12/20 0856  BP: 119/77 128/75  Pulse: 89 89  Resp: 16 18  Temp: 98.7 F (37.1 C) 98.5 F (36.9 C)  SpO2: 95% 96%   Vitals:   09/11/20 1918 09/11/20 2300 09/12/20 0300 09/12/20 0856  BP: 113/72 120/70 119/77 128/75  Pulse: (!) 103 98 89 89  Resp: $Remo'16 16 16 18  'YpLkQ$ Temp: 98.8 F (37.1 C) 98.8 F (37.1 C) 98.7 F (37.1 C) 98.5 F (36.9 C)  TempSrc:      SpO2: 95% 95% 95% 96%  Weight:      Height:  General: Pt is drowsy but awake, not in acute distress Cardiovascular: RRR, S1/S2 +, no rubs, no gallops Respiratory: CTA bilaterally, no wheezing, no rhonchi Abdominal: Soft, NT, ND, bowel sounds + Extremities: no edema, no cyanosis    The results of significant diagnostics from this hospitalization (including imaging, microbiology, ancillary and laboratory) are listed below for reference.     Microbiology: Recent Results (from the past 240 hour(s))  SARS CORONAVIRUS 2 (TAT 6-24 HRS) Nasopharyngeal Nasopharyngeal Swab     Status: None   Collection Time: 09/02/20  8:12 PM   Specimen: Nasopharyngeal Swab  Result Value Ref Range Status   SARS Coronavirus 2 NEGATIVE NEGATIVE Final    Comment: (NOTE) SARS-CoV-2 target nucleic acids are NOT DETECTED.  The SARS-CoV-2 RNA is generally detectable in upper and lower respiratory specimens during the acute phase of infection. Negative results do not preclude SARS-CoV-2 infection, do not rule out co-infections with other pathogens, and should not be used as the sole basis for treatment or other patient management decisions. Negative results must be combined with clinical observations, patient history, and epidemiological  information. The expected result is Negative.  Fact Sheet for Patients: SugarRoll.be  Fact Sheet for Healthcare Providers: https://www.woods-mathews.com/  This test is not yet approved or cleared by the [place] FDA and  has been authorized for detection and/or diagnosis of SARS-CoV-2 by FDA under an Emergency Use Authorization (EUA). This EUA will remain  in effect (meaning this test can be used) for the duration of the COVID-19 declaration under Se ction 564(b)(1) of the Act, 21 U.S.C. section 360bbb-3(b)(1), unless the authorization is terminated or revoked sooner.  Performed at [facility], [place] [address] [place], [place] [address]   SARS Coronavirus 2 by RT PCR (hospital order, performed in [facility] hospital lab) Nasopharyngeal Nasopharyngeal Swab     Status: None   Collection Time: 09/03/20 10:00 AM   Specimen: Nasopharyngeal Swab  Result Value Ref Range Status   SARS Coronavirus 2 NEGATIVE NEGATIVE Final    Comment: (NOTE) SARS-CoV-2 target nucleic acids are NOT DETECTED.  The SARS-CoV-2 RNA is generally detectable in upper and lower respiratory specimens during the acute phase of infection. The lowest concentration of SARS-CoV-2 viral copies this assay can detect is 250 copies / mL. A negative result does not preclude SARS-CoV-2 infection and should not be used as the sole basis for treatment or other patient management decisions.  A negative result may occur with improper specimen collection / handling, submission of specimen other than nasopharyngeal swab, presence of viral mutation(s) within the areas targeted by this assay, and inadequate number of viral copies (<250 copies / mL). A negative result must be combined with clinical observations, patient history, and epidemiological information.  Fact Sheet for Patients:   StrictlyIdeas.no  Fact Sheet for Healthcare  Providers: BankingDealers.co.za  This test is not yet approved or  cleared by the [place] FDA and has been authorized for detection and/or diagnosis of SARS-CoV-2 by FDA under an Emergency Use Authorization (EUA).  This EUA will remain in effect (meaning this test can be used) for the duration of the COVID-19 declaration under Section 564(b)(1) of the Act, 21 U.S.C. section 360bbb-3(b)(1), unless the authorization is terminated or revoked sooner.  Performed at [facility], [address] [place], [place] [address]   SARS Coronavirus 2 by RT PCR (hospital order, performed in [facility] hospital lab) Nasopharyngeal Nasopharyngeal Swab     Status: None   Collection Time: 09/05/20  3:35 PM   Specimen: Nasopharyngeal Swab  Result Value Ref Range Status   SARS Coronavirus 2 NEGATIVE NEGATIVE Final    Comment: (NOTE) SARS-CoV-2 target nucleic acids are NOT DETECTED.  The SARS-CoV-2 RNA is generally detectable in upper and lower respiratory specimens during the acute phase of infection. The lowest concentration of SARS-CoV-2 viral copies this assay can detect is 250 copies / mL. A negative result does not preclude SARS-CoV-2 infection and should not be used as the sole basis for treatment or other patient management decisions.  A negative result may occur with improper specimen collection / handling, submission of specimen other than nasopharyngeal swab, presence of viral mutation(s) within the areas targeted by this assay, and inadequate number of viral copies (<250 copies / mL). A negative result must be combined with clinical observations, patient history, and epidemiological information.  Fact Sheet for Patients:   StrictlyIdeas.no  Fact Sheet for Healthcare Providers: BankingDealers.co.za  This test is not yet approved or  cleared by the [place] FDA and has been authorized for detection  and/or diagnosis of SARS-CoV-2 by FDA under an Emergency Use Authorization (EUA).  This EUA will remain in effect (meaning this test can be used) for the duration of the COVID-19 declaration under Section 564(b)(1) of the Act, 21 U.S.C. section 360bbb-3(b)(1), unless the authorization is terminated or revoked sooner.  Performed at [facility], [address] [place], [place] [address]      Labs: BNP (last 3 results) No results for input(s): BNP in the last 8760 hours. Basic Metabolic Panel: Recent Labs  Lab 09/08/20 0434  NA 134*  K 3.9  CL 98  CO2 25  GLUCOSE 96  BUN 14  CREATININE 0.55  CALCIUM 8.4*   Liver Function Tests: No results for input(s): AST, ALT, ALKPHOS, BILITOT, PROT, ALBUMIN in the last 168 hours. No results for input(s): LIPASE, AMYLASE in the last 168 hours. No results for input(s): AMMONIA in the last 168 hours. CBC: Recent Labs  Lab 09/06/20 0634 09/07/20 0443 09/11/20 0449  WBC 7.7 8.6 8.4  NEUTROABS 5.7 6.6  --   HGB 11.2* 10.9* 10.9*  HCT 33.7* 34.0* 34.0*  MCV 89.2 91.2 91.2  PLT 418* 435* 417*   Cardiac Enzymes: No results for input(s): CKTOTAL, CKMB, CKMBINDEX, TROPONINI in the last 168 hours. BNP: Invalid input(s): POCBNP CBG: No results for input(s): GLUCAP in the last 168 hours. D-Dimer No results for input(s): DDIMER in the last 72 hours. Hgb A1c No results for input(s): HGBA1C in the last 72 hours. Lipid Profile No results for input(s): CHOL, HDL, LDLCALC, TRIG, CHOLHDL, LDLDIRECT in the last 72 hours. Thyroid function studies No results for input(s): TSH, T4TOTAL, T3FREE, THYROIDAB in the last 72 hours.  Invalid input(s): FREET3 Anemia work up No results for input(s): VITAMINB12, FOLATE, FERRITIN, TIBC, IRON, RETICCTPCT in the last 72 hours. Urinalysis    Component Value Date/Time   COLORURINE YELLOW (A) 09/01/2020 1159   APPEARANCEUR CLOUDY (A) 09/01/2020 1159   APPEARANCEUR Clear 05/06/2013 1321    LABSPEC 1.028 09/01/2020 1159   LABSPEC 1.005 05/06/2013 1321   PHURINE 9.0 (H) 09/01/2020 1159   GLUCOSEU NEGATIVE 09/01/2020 1159   GLUCOSEU Negative 05/06/2013 1321   HGBUR NEGATIVE 09/01/2020 1159   BILIRUBINUR NEGATIVE 09/01/2020 1159   BILIRUBINUR Negative 05/06/2013 1321   KETONESUR NEGATIVE 09/01/2020 1159   PROTEINUR NEGATIVE 09/01/2020 1159   NITRITE NEGATIVE 09/01/2020 1159   LEUKOCYTESUR NEGATIVE 09/01/2020 1159   LEUKOCYTESUR 2+ 05/06/2013 1321  Sepsis Labs Invalid input(s): PROCALCITONIN,  WBC,  LACTICIDVEN Microbiology Recent Results (from the past 240 hour(s))  SARS CORONAVIRUS 2 (TAT 6-24 HRS) Nasopharyngeal Nasopharyngeal Swab     Status: None   Collection Time: 09/02/20  8:12 PM   Specimen: Nasopharyngeal Swab  Result Value Ref Range Status   SARS Coronavirus 2 NEGATIVE NEGATIVE Final    Comment: (NOTE) SARS-CoV-2 target nucleic acids are NOT DETECTED.  The SARS-CoV-2 RNA is generally detectable in upper and lower respiratory specimens during the acute phase of infection. Negative results do not preclude SARS-CoV-2 infection, do not rule out co-infections with other pathogens, and should not be used as the sole basis for treatment or other patient management decisions. Negative results must be combined with clinical observations, patient history, and epidemiological information. The expected result is Negative.  Fact Sheet for Patients: SugarRoll.be  Fact Sheet for Healthcare Providers: https://www.woods-mathews.com/  This test is not yet approved or cleared by the [place] FDA and  has been authorized for detection and/or diagnosis of SARS-CoV-2 by FDA under an Emergency Use Authorization (EUA). This EUA will remain  in effect (meaning this test can be used) for the duration of the COVID-19 declaration under Se ction 564(b)(1) of the Act, 21 U.S.C. section 360bbb-3(b)(1), unless the authorization is  terminated or revoked sooner.  Performed at [facility], [place] [address] [place], [place] [address]   SARS Coronavirus 2 by RT PCR (hospital order, performed in [facility] hospital lab) Nasopharyngeal Nasopharyngeal Swab     Status: None   Collection Time: 09/03/20 10:00 AM   Specimen: Nasopharyngeal Swab  Result Value Ref Range Status   SARS Coronavirus 2 NEGATIVE NEGATIVE Final    Comment: (NOTE) SARS-CoV-2 target nucleic acids are NOT DETECTED.  The SARS-CoV-2 RNA is generally detectable in upper and lower respiratory specimens during the acute phase of infection. The lowest concentration of SARS-CoV-2 viral copies this assay can detect is 250 copies / mL. A negative result does not preclude SARS-CoV-2 infection and should not be used as the sole basis for treatment or other patient management decisions.  A negative result may occur with improper specimen collection / handling, submission of specimen other than nasopharyngeal swab, presence of viral mutation(s) within the areas targeted by this assay, and inadequate number of viral copies (<250 copies / mL). A negative result must be combined with clinical observations, patient history, and epidemiological information.  Fact Sheet for Patients:   StrictlyIdeas.no  Fact Sheet for Healthcare Providers: BankingDealers.co.za  This test is not yet approved or  cleared by the [place] FDA and has been authorized for detection and/or diagnosis of SARS-CoV-2 by FDA under an Emergency Use Authorization (EUA).  This EUA will remain in effect (meaning this test can be used) for the duration of the COVID-19 declaration under Section 564(b)(1) of the Act, 21 U.S.C. section 360bbb-3(b)(1), unless the authorization is terminated or revoked sooner.  Performed at [facility], [address] [place],  [address]   SARS Coronavirus 2 by RT PCR (hospital order,  performed in [facility] hospital lab) Nasopharyngeal Nasopharyngeal Swab     Status: None   Collection Time: 09/05/20  3:35 PM   Specimen: Nasopharyngeal Swab  Result Value Ref Range Status   SARS Coronavirus 2 NEGATIVE NEGATIVE Final    Comment: (NOTE) SARS-CoV-2 target nucleic acids are NOT DETECTED.  The SARS-CoV-2 RNA is generally detectable in upper and lower respiratory specimens during the acute phase of infection. The  lowest concentration of SARS-CoV-2 viral copies this assay can detect is 250 copies / mL. A negative result does not preclude SARS-CoV-2 infection and should not be used as the sole basis for treatment or other patient management decisions.  A negative result may occur with improper specimen collection / handling, submission of specimen other than nasopharyngeal swab, presence of viral mutation(s) within the areas targeted by this assay, and inadequate number of viral copies (<250 copies / mL). A negative result must be combined with clinical observations, patient history, and epidemiological information.  Fact Sheet for Patients:   StrictlyIdeas.no  Fact Sheet for Healthcare Providers: BankingDealers.co.za  This test is not yet approved or  cleared by the [place] FDA and has been authorized for detection and/or diagnosis of SARS-CoV-2 by FDA under an Emergency Use Authorization (EUA).  This EUA will remain in effect (meaning this test can be used) for the duration of the COVID-19 declaration under Section 564(b)(1) of the Act, 21 U.S.C. section 360bbb-3(b)(1), unless the authorization is terminated or revoked sooner.  Performed at [facility], [address] [place], [place] [address]      Time coordinating discharge: Over 30 minutes  SIGNED:   Ezekiel Slocumb, DO [facility] 09/12/2020, 10:40 AM   If 7PM-7AM, please contact night-coverage [email or web address]

## 2020-09-13 ENCOUNTER — Ambulatory Visit: Payer: Medicare HMO | Admitting: Physical Therapy

## 2020-09-18 ENCOUNTER — Ambulatory Visit: Payer: Medicare HMO | Admitting: Physical Therapy

## 2020-09-20 ENCOUNTER — Ambulatory Visit: Payer: Medicare HMO | Admitting: Physical Therapy

## 2020-09-25 ENCOUNTER — Ambulatory Visit: Payer: Medicare HMO | Admitting: Physical Therapy

## 2020-09-27 ENCOUNTER — Ambulatory Visit: Payer: Medicare HMO | Admitting: Physical Therapy

## 2020-10-02 ENCOUNTER — Ambulatory Visit: Payer: Medicare HMO | Admitting: Physical Therapy

## 2020-10-04 ENCOUNTER — Ambulatory Visit: Payer: Medicare HMO | Admitting: Physical Therapy

## 2020-10-06 ENCOUNTER — Encounter: Payer: Self-pay | Admitting: Primary Care

## 2020-10-06 NOTE — Progress Notes (Signed)
Patient deceased 09/20/2020.

## 2020-10-07 DEATH — deceased

## 2021-01-12 ENCOUNTER — Other Ambulatory Visit: Payer: Medicare HMO

## 2021-01-18 ENCOUNTER — Ambulatory Visit: Payer: Medicare HMO | Admitting: Radiation Oncology

## 2021-04-26 ENCOUNTER — Other Ambulatory Visit: Payer: Medicare HMO

## 2021-04-26 ENCOUNTER — Ambulatory Visit: Payer: Medicare HMO | Admitting: Hematology and Oncology

## 2021-05-28 IMAGING — CR DG THORACIC SPINE 2V
3 series · 3 of 3 positions shown · non-contrast
Comparison: 12/06/2019

CLINICAL DATA: Fall, back pain

EXAM:
THORACIC SPINE 2 VIEWS

[t-spine ap]
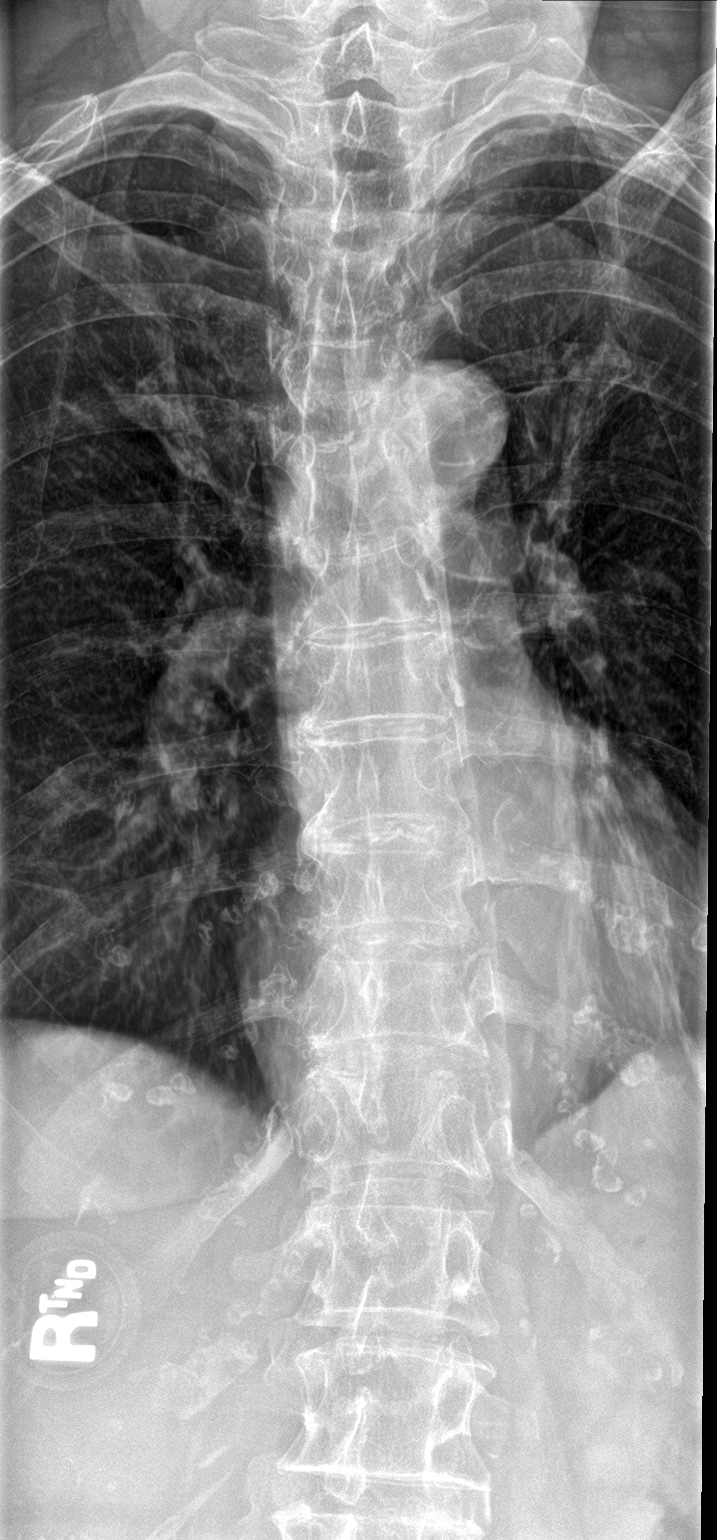

[t-spine lat]
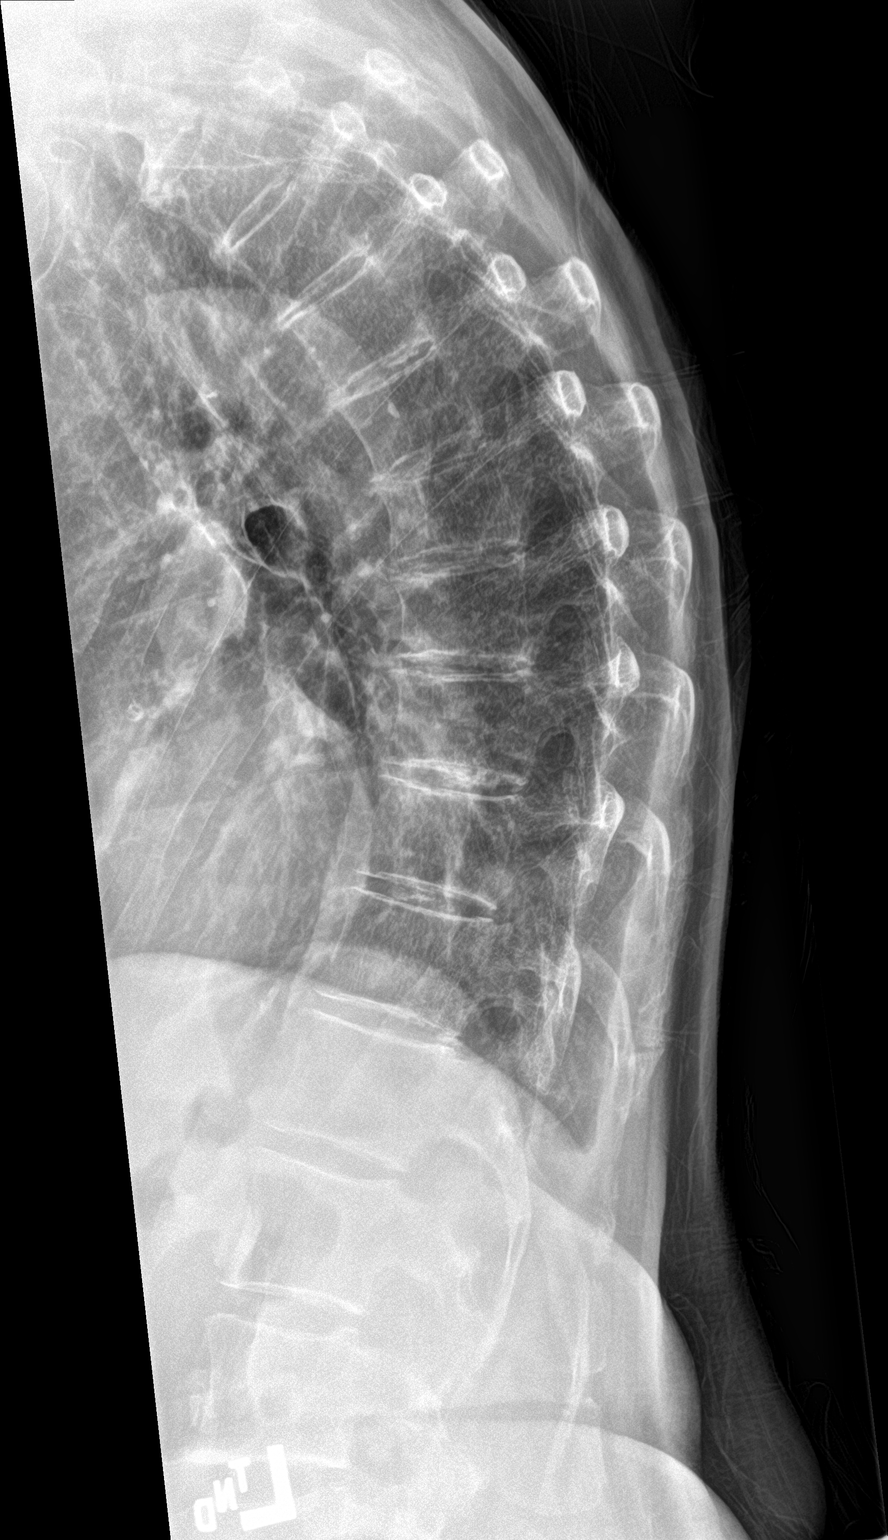

[t-spine swimmers]
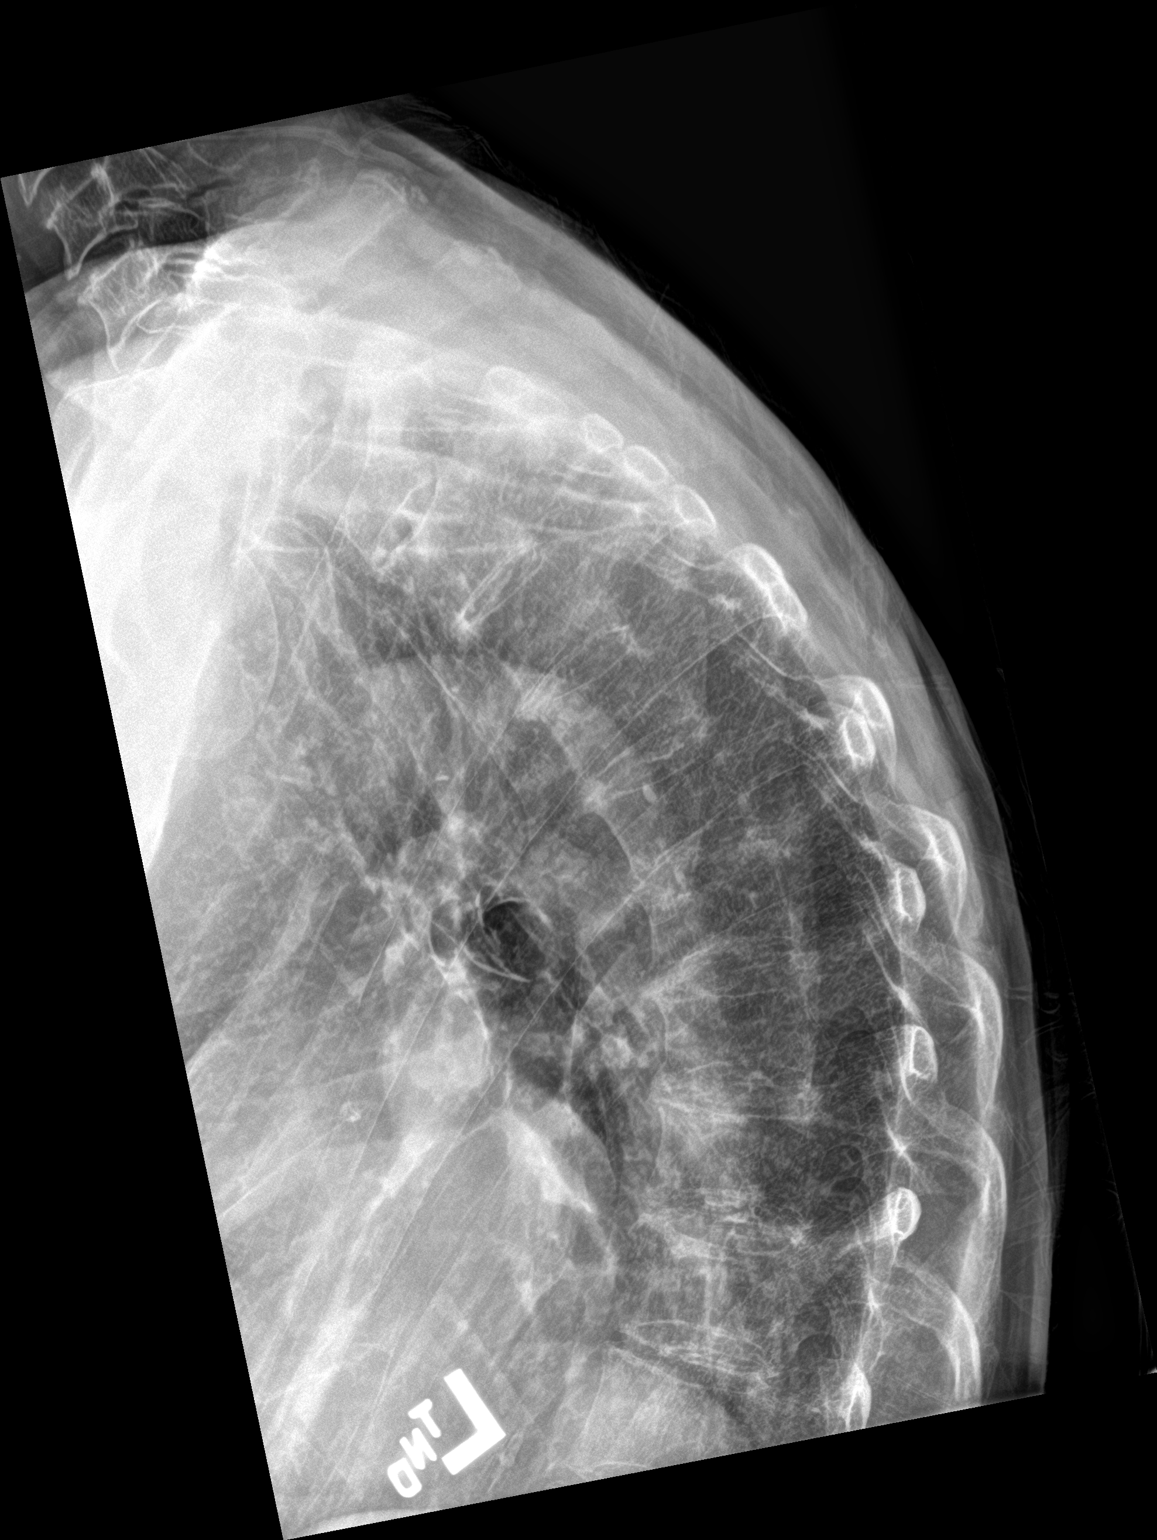

[3 of 3 positions shown; findings below may reference images not displayed]

FINDINGS: Normal thoracic kyphosis. No fracture or listhesis of the thoracic
spine. Minimal endplate changes are present in keeping with changes
of mild degenerative disc disease. Vertebral body heights are
preserved. No destructive osseous lesion. Paraspinal soft tissues
are unremarkable.
IMPRESSION: No acute fracture or listhesis.

## 2021-05-28 IMAGING — CR DG LUMBAR SPINE 2-3V
3 series · 3 of 3 positions shown · non-contrast
Comparison: 12/06/2019

CLINICAL DATA: Fall, back pain

EXAM:
LUMBAR SPINE - 2-3 VIEW

[l-spine ap]
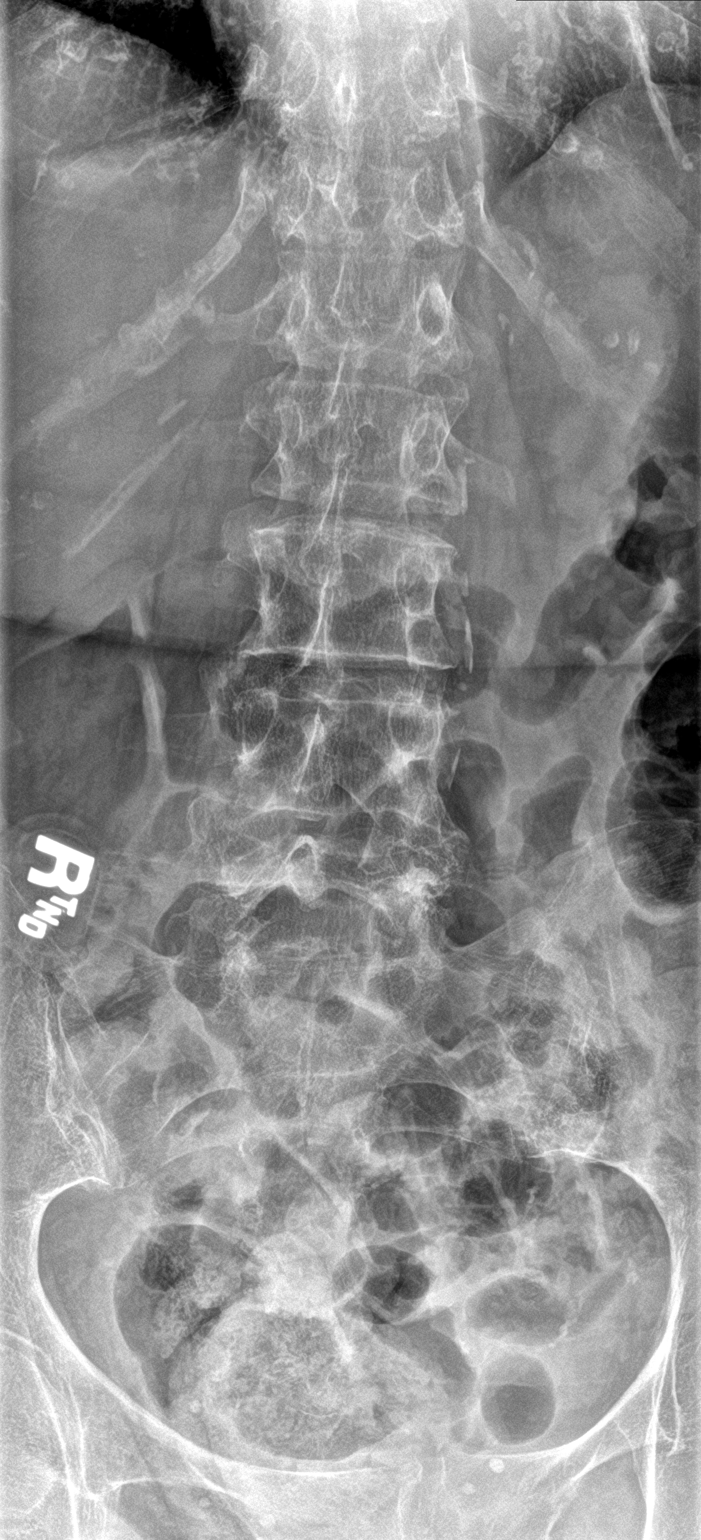

[l-spine spot]
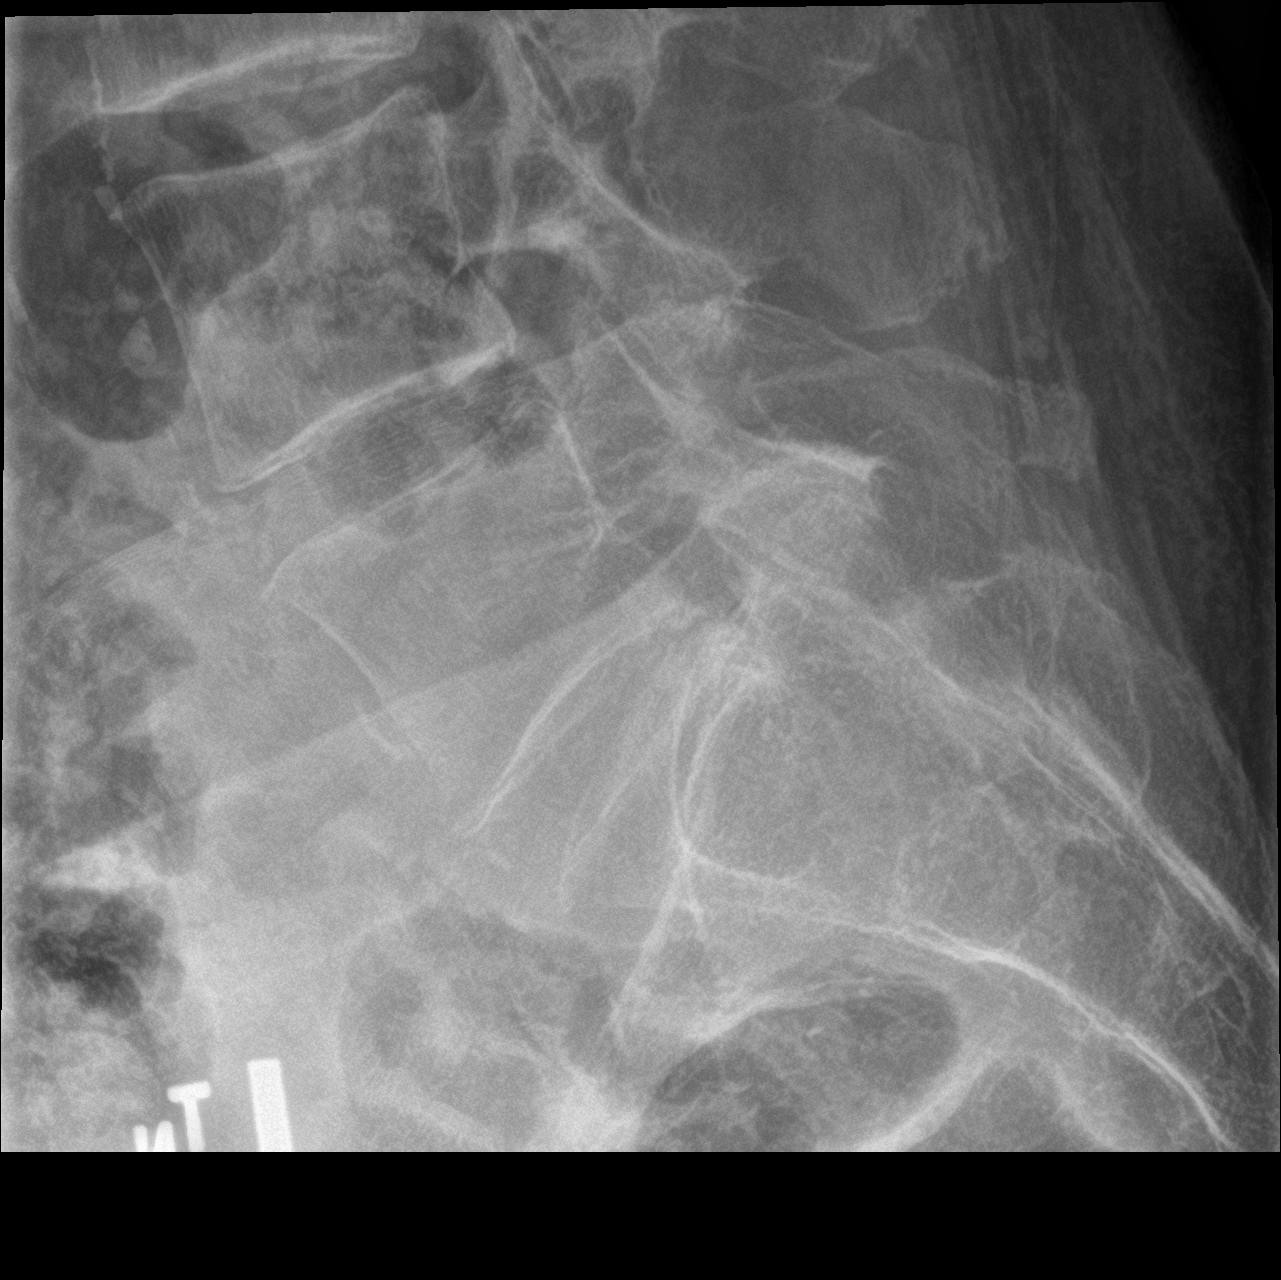

[l-spine lat]
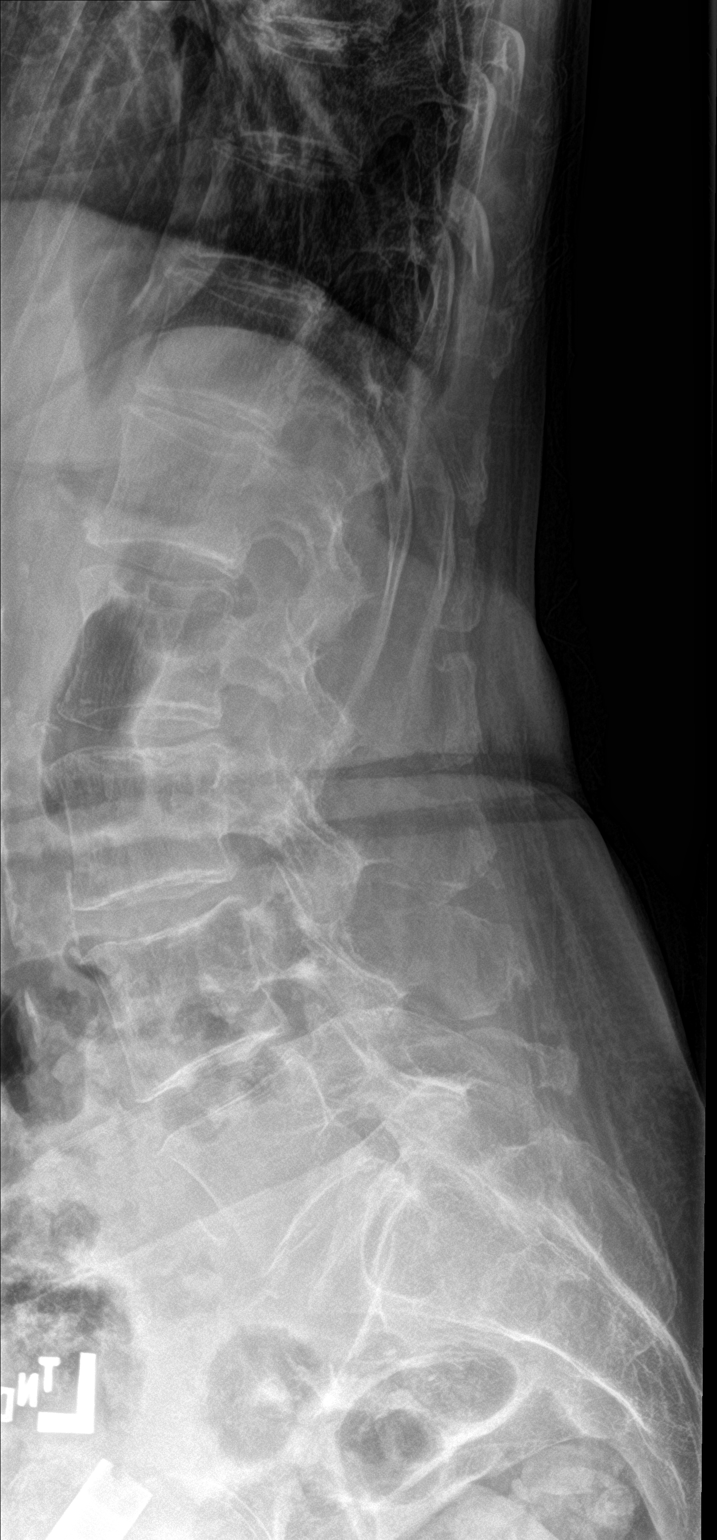

[3 of 3 positions shown; findings below may reference images not displayed]

FINDINGS: Normal lumbar lordosis. No fracture or listhesis of the lumbar
spine. Vertebral body heights are preserved. Intervertebral disc
heights are preserved. No destructive osseous lesion. Vascular
calcifications are seen within the paraspinal soft tissues.
Paraspinal soft tissues are otherwise unremarkable.
IMPRESSION: No acute fracture or listhesis.
